# Patient Record
Sex: Male | Born: 1955 | Race: White | Hispanic: No | Smoking: Former smoker
Health system: Southern US, Community
[De-identification: ages and names within clinical notes are randomized; demographics above are authoritative.]

## PROBLEM LIST (undated history)

## (undated) DIAGNOSIS — K219 Gastro-esophageal reflux disease without esophagitis: Secondary | ICD-10-CM

## (undated) DIAGNOSIS — I10 Essential (primary) hypertension: Secondary | ICD-10-CM

## (undated) DIAGNOSIS — Z87442 Personal history of urinary calculi: Secondary | ICD-10-CM

## (undated) DIAGNOSIS — I739 Peripheral vascular disease, unspecified: Secondary | ICD-10-CM

## (undated) DIAGNOSIS — E119 Type 2 diabetes mellitus without complications: Secondary | ICD-10-CM

## (undated) DIAGNOSIS — M1990 Unspecified osteoarthritis, unspecified site: Secondary | ICD-10-CM

## (undated) HISTORY — PX: KNEE SURGERY: SHX244

## (undated) HISTORY — PX: PENILE PROSTHESIS IMPLANT: SHX240

## (undated) HISTORY — PX: WRIST SURGERY: SHX841

---

## 2009-08-31 ENCOUNTER — Ambulatory Visit (HOSPITAL_BASED_OUTPATIENT_CLINIC_OR_DEPARTMENT_OTHER): Admission: RE | Admit: 2009-08-31 | Discharge: 2009-08-31 | Payer: Self-pay | Admitting: Orthopedic Surgery

## 2009-10-09 ENCOUNTER — Encounter: Admission: RE | Admit: 2009-10-09 | Discharge: 2009-12-06 | Payer: Self-pay | Admitting: Orthopedic Surgery

## 2011-03-29 LAB — GLUCOSE, CAPILLARY: Glucose-Capillary: 152 mg/dL — ABNORMAL HIGH (ref 70–99)

## 2017-06-01 ENCOUNTER — Emergency Department (HOSPITAL_BASED_OUTPATIENT_CLINIC_OR_DEPARTMENT_OTHER): Payer: BLUE CROSS/BLUE SHIELD

## 2017-06-01 ENCOUNTER — Encounter (HOSPITAL_BASED_OUTPATIENT_CLINIC_OR_DEPARTMENT_OTHER): Payer: Self-pay | Admitting: Emergency Medicine

## 2017-06-01 ENCOUNTER — Emergency Department (HOSPITAL_BASED_OUTPATIENT_CLINIC_OR_DEPARTMENT_OTHER)
Admission: EM | Admit: 2017-06-01 | Discharge: 2017-06-01 | Disposition: A | Discharge status: Home / Self Care | Payer: BLUE CROSS/BLUE SHIELD | Attending: Emergency Medicine | Admitting: Emergency Medicine

## 2017-06-01 DIAGNOSIS — Z7984 Long term (current) use of oral hypoglycemic drugs: Secondary | ICD-10-CM | POA: Diagnosis not present

## 2017-06-01 DIAGNOSIS — E119 Type 2 diabetes mellitus without complications: Secondary | ICD-10-CM | POA: Insufficient documentation

## 2017-06-01 DIAGNOSIS — I10 Essential (primary) hypertension: Secondary | ICD-10-CM | POA: Diagnosis not present

## 2017-06-01 DIAGNOSIS — R109 Unspecified abdominal pain: Secondary | ICD-10-CM

## 2017-06-01 DIAGNOSIS — Z79899 Other long term (current) drug therapy: Secondary | ICD-10-CM | POA: Diagnosis not present

## 2017-06-01 DIAGNOSIS — Z7982 Long term (current) use of aspirin: Secondary | ICD-10-CM | POA: Insufficient documentation

## 2017-06-01 DIAGNOSIS — Z87891 Personal history of nicotine dependence: Secondary | ICD-10-CM | POA: Diagnosis not present

## 2017-06-01 HISTORY — DX: Essential (primary) hypertension: I10

## 2017-06-01 HISTORY — DX: Type 2 diabetes mellitus without complications: E11.9

## 2017-06-01 HISTORY — DX: Gastro-esophageal reflux disease without esophagitis: K21.9

## 2017-06-01 LAB — CBC WITH DIFFERENTIAL/PLATELET
BASOS ABS: 0 10*3/uL (ref 0.0–0.1)
Basophils Relative: 0 %
Eosinophils Absolute: 0.1 10*3/uL (ref 0.0–0.7)
Eosinophils Relative: 3 %
HCT: 42.7 % (ref 39.0–52.0)
Hemoglobin: 15.1 g/dL (ref 13.0–17.0)
LYMPHS PCT: 39 %
Lymphs Abs: 1.6 10*3/uL (ref 0.7–4.0)
MCH: 33.7 pg (ref 26.0–34.0)
MCHC: 35.4 g/dL (ref 30.0–36.0)
MCV: 95.3 fL (ref 78.0–100.0)
Monocytes Absolute: 0.5 10*3/uL (ref 0.1–1.0)
Monocytes Relative: 12 %
NEUTROS ABS: 1.9 10*3/uL (ref 1.7–7.7)
Neutrophils Relative %: 46 %
Platelets: 146 10*3/uL — ABNORMAL LOW (ref 150–400)
RBC: 4.48 MIL/uL (ref 4.22–5.81)
RDW: 11.7 % (ref 11.5–15.5)
WBC: 4.1 10*3/uL (ref 4.0–10.5)

## 2017-06-01 LAB — URINALYSIS, MICROSCOPIC (REFLEX)
RBC / HPF: NONE SEEN RBC/hpf (ref 0–5)
SQUAMOUS EPITHELIAL / LPF: NONE SEEN

## 2017-06-01 LAB — BASIC METABOLIC PANEL
ANION GAP: 10 (ref 5–15)
BUN: 15 mg/dL (ref 6–20)
CO2: 26 mmol/L (ref 22–32)
Calcium: 9.6 mg/dL (ref 8.9–10.3)
Chloride: 99 mmol/L — ABNORMAL LOW (ref 101–111)
Creatinine, Ser: 0.96 mg/dL (ref 0.61–1.24)
GFR calc Af Amer: >60 mL/min (ref >60)
GFR calc non Af Amer: >60 mL/min (ref >60)
GLUCOSE: 249 mg/dL — AB (ref 65–99)
Potassium: 4.3 mmol/L (ref 3.5–5.1)
Sodium: 135 mmol/L (ref 135–145)

## 2017-06-01 LAB — URINALYSIS, ROUTINE W REFLEX MICROSCOPIC
Bilirubin Urine: NEGATIVE
Hgb urine dipstick: NEGATIVE
Ketones, ur: 15 mg/dL — AB
LEUKOCYTES UA: NEGATIVE
Nitrite: NEGATIVE
PROTEIN: NEGATIVE mg/dL
Specific Gravity, Urine: 1.039 — ABNORMAL HIGH (ref 1.005–1.030)
pH: 5.5 (ref 5.0–8.0)

## 2017-06-01 MED ORDER — KETOROLAC TROMETHAMINE 30 MG/ML IJ SOLN
30.0000 mg | Freq: Once | INTRAMUSCULAR | Status: AC
  Administered 2017-06-01: 30 mg via INTRAVENOUS
  Filled 2017-06-01: qty 1

## 2017-06-01 MED ORDER — ONDANSETRON 4 MG PO TBDP: 4.0000 mg | ORAL_TABLET | Freq: Three times a day (TID) | ORAL | 0 refills | Status: DC | PRN

## 2017-06-01 MED ORDER — OXYCODONE-ACETAMINOPHEN 5-325 MG PO TABS
1.0000 | ORAL_TABLET | ORAL | 0 refills | Status: DC | PRN
Start: 2017-06-01 — End: 2018-06-29

## 2017-06-01 NOTE — Discharge Instructions (Signed)
As we discussed, your CT scan did show findings that you likely have recently passed a kidney stone. There was also a calcification noted in your left renal sinus, this is an incidental finding and should not cause you any issues. Take the prescribed medication as directed.  Use caution, can make you sleepy/drowsy.  Candidate for Zofran if needed for nausea. Follow-up with your urologist tomorrow-- copies of labs and CT on back for their review. Return to the ED for new or worsening symptoms.

## 2017-06-01 NOTE — ED Provider Notes (Signed)
[facility]   CSN: [id number] Arrival date & time: 06/01/17  [id number]     History   Chief Complaint Chief Complaint  Patient presents with  . Flank Pain    HPI Shawn Daniels is a 61 y.o. male.  The history is provided by the patient and medical records.  Flank Pain     61 year old male with history of diabetes, GERD, hypertension, presenting to the ED for left flank pain. States this began yesterday, has been coming in waves since onset. States when pain comes it is sharp and severe.  He has not had any nausea, vomiting, fever, or chills. He has had some intermittently dark urine, and sure if it is blood. He denies any dysuria or difficulty urinating. States he did do a lot of yard work day before yesterday, so initially thought it was a muscle strain, but pain has began wrapping around to the left anterior abdomen. He has no history of kidney stones. No prior abdominal surgeries.  States he did take a Ultracet this morning that he had left over from a dental procedure around 6:30 AM, states it has eased his pain, but still has waves of sharp pain that occur. No pain at present. No chest pain or shortness of breath.  Past Medical History:  Diagnosis Date  . Diabetes mellitus without complication (HCC)   . GERD (gastroesophageal reflux disease)   . Hypertension     There are no active problems to display for this patient.   History reviewed. No pertinent surgical history.     Home Medications    Prior to Admission medications   Medication Sig Start Date End Date Taking? Authorizing Provider  aspirin 81 MG chewable tablet Chew by mouth daily.   Yes [provider]  dapagliflozin propanediol (FARXIGA) 10 MG TABS tablet Take 10 mg by mouth daily.   Yes [provider]  lisinopril (PRINIVIL,ZESTRIL) 10 MG tablet Take 10 mg by mouth daily.   Yes [provider]  omeprazole (PRILOSEC) 10 MG capsule Take 10 mg by mouth  daily.   Yes [provider]    Family History No family history on file.  Social History Social History  Substance Use Topics  . Smoking status: Former Games developermoker  . Smokeless tobacco: Never Used  . Alcohol use Yes     Allergies   Codeine; Keflex [cephalexin]; and Penicillins   Review of Systems Review of Systems  Genitourinary: Positive for flank pain.  All other systems reviewed and are negative.    Physical Exam Updated Vital Signs BP (!) 164/91 (BP Location: Left Arm)   Pulse 88   Temp 98.9 F (37.2 C) (Oral)   Resp 18   Ht 6\' 1"  (1.854 m)   Wt 93.9 kg (207 lb)   SpO2 97%   BMI 27.31 kg/m   Physical Exam  Constitutional: He is oriented to person, place, and time. He appears well-developed and well-nourished.  HENT:  Head: Normocephalic and atraumatic.  Mouth/Throat: Oropharynx is clear and moist.  Eyes: Conjunctivae and EOM are normal. Pupils are equal, round, and reactive to light.  Neck: Normal range of motion.  Cardiovascular: Normal rate, regular rhythm and normal heart sounds.   Pulmonary/Chest: Effort normal and breath sounds normal. No respiratory distress. He has no wheezes.  Abdominal: Soft. Bowel sounds are normal. There is no tenderness. There is no CVA tenderness.  Abdomen soft, benign, no CVA tenderness  Musculoskeletal: Normal range of motion.  No  significant muscular tenderness of the left lower back, no midline deformity, no step-off  Neurological: He is alert and oriented to person, place, and time.  Skin: Skin is warm and dry.  Psychiatric: He has a normal mood and affect.  Nursing note and vitals reviewed.    ED Treatments / Results  Labs (all labs ordered are listed, but only abnormal results are displayed) Labs Reviewed  URINALYSIS, ROUTINE W REFLEX MICROSCOPIC - Abnormal; Notable for the following:       Result Value   Specific Gravity, Urine 1.039 (*)    Glucose, UA >=500 (*)    Ketones, ur 15 (*)    All other  components within normal limits  CBC WITH DIFFERENTIAL/PLATELET - Abnormal; Notable for the following:    Platelets 146 (*)    All other components within normal limits  BASIC METABOLIC PANEL - Abnormal; Notable for the following:    Chloride 99 (*)    Glucose, Bld 249 (*)    All other components within normal limits  URINALYSIS, MICROSCOPIC (REFLEX) - Abnormal; Notable for the following:    Bacteria, UA RARE (*)    All other components within normal limits    EKG  EKG Interpretation None       Radiology Ct Renal Stone Study  Result Date: 06/01/2017 CLINICAL DATA:  Left flank pain x1 day EXAM: CT ABDOMEN AND PELVIS WITHOUT CONTRAST TECHNIQUE: Multidetector CT imaging of the abdomen and pelvis was performed following the standard protocol without IV contrast. COMPARISON:  None. FINDINGS: Lower chest: Lung bases are clear. Hepatobiliary: Mild hepatic steatosis with focal fatty sparing along the gallbladder fossa. Gallbladder is unremarkable. No intrahepatic or extrahepatic dilatation. Pancreas: Within normal limits. Spleen: Within normal limits. Adrenals/Urinary Tract: Adrenal glands are within normal limits. Right kidney is within normal limits, noting renal vascular calcifications. No hydronephrosis. Left kidney is notable for mild nonspecific perinephric and periureteral stranding. Calcification in the left renal sinus (series 2/ image 30), suggests renal vascular calcification. No ureteral or bladder calculi.  No hydronephrosis. Bladder is within normal limits. Stomach/Bowel: Stomach is within normal limits. No evidence of bowel obstruction. Normal appendix (series 2/ image 60). Vascular/Lymphatic: No evidence of abdominal aortic aneurysm. Atherosclerotic calcifications of the abdominal aorta and branch vessels. No suspicious abdominopelvic lymphadenopathy. Reproductive: Prostate is unremarkable. Other: No abdominopelvic ascites. Tiny fat containing bilateral inguinal hernias.  Musculoskeletal: Visualized osseous structures are within normal limits. IMPRESSION: No renal, ureteral, or bladder calculi.  No hydronephrosis. Mild nonspecific left perinephric and left periureteral stranding. Differential considerations include recent passage of a ureteral calculus versus pyelonephritis. No evidence of bowel obstruction.  Normal appendix. Electronically Signed   By: Charline Bills M.D.   On: 06/01/2017 10:32    Procedures Procedures (including critical care time)     Medications Ordered in ED Medications - No data to display   Initial Impression / Assessment and Plan / ED Course  I have reviewed the triage vital signs and the nursing notes.  Pertinent labs & imaging results that were available during my care of the patient were reviewed by me and considered in my medical decision making (see chart for details).  61 year old male here with left flank pain. Began 2 days ago, has been waxing and waning since then. Did do some yard work recently, but has noticed pain radiating around the left abdomen. He is afebrile and nontoxic. Abdomen is soft and benign. No distinct CVA tenderness or paraspinal muscular tenderness. Lab work overall reassuring. Glucose is  mildly elevated but no signs of DKA. UA with rare bacteria, but overall noninfectious. CT renal study was obtained revealing left perinephric and periureteral stranding, possible due to recently passed stone.  Suspect this may be cause of symptoms.  Also note of calcification in left renal sinus.  Pain improved here with Toradol.  We'll plan to discharge home with continued symptomatic care. He has previously scheduled follow-up with his urologist tomorrow, I have given him copies of his lab and imaging studies from today to discuss with his physician.  Discussed plan with patient, he acknowledged understanding and agreed with plan of care.  Return precautions given for new or worsening symptoms.  Final Clinical  Impressions(s) / ED Diagnoses   Final diagnoses:  Left flank pain    New Prescriptions Discharge Medication List as of 06/01/2017 11:11 AM    START taking these medications   Details  ondansetron (ZOFRAN ODT) 4 MG disintegrating tablet Take 1 tablet (4 mg total) by mouth every 8 (eight) hours as needed for nausea., Starting Sun 06/01/2017, Print    oxyCODONE-acetaminophen (PERCOCET) 5-325 MG tablet Take 1 tablet by mouth every 4 (four) hours as needed., Starting Sun 06/01/2017, Print         Garlon Hatchet, PA-C 06/01/17 1159    Melene Plan, DO 06/01/17 1252

## 2017-06-01 NOTE — ED Triage Notes (Signed)
L flank pain since yesterday, denies N/V and urinary symptoms.

## 2017-09-09 ENCOUNTER — Encounter (HOSPITAL_BASED_OUTPATIENT_CLINIC_OR_DEPARTMENT_OTHER): Payer: Self-pay

## 2017-09-09 ENCOUNTER — Emergency Department (HOSPITAL_BASED_OUTPATIENT_CLINIC_OR_DEPARTMENT_OTHER)
Admission: EM | Admit: 2017-09-09 | Discharge: 2017-09-09 | Disposition: A | Discharge status: Home / Self Care | Payer: BLUE CROSS/BLUE SHIELD | Attending: Emergency Medicine | Admitting: Emergency Medicine

## 2017-09-09 ENCOUNTER — Emergency Department (HOSPITAL_BASED_OUTPATIENT_CLINIC_OR_DEPARTMENT_OTHER): Payer: BLUE CROSS/BLUE SHIELD

## 2017-09-09 ENCOUNTER — Emergency Department (HOSPITAL_COMMUNITY): Payer: BLUE CROSS/BLUE SHIELD

## 2017-09-09 DIAGNOSIS — Y999 Unspecified external cause status: Secondary | ICD-10-CM | POA: Insufficient documentation

## 2017-09-09 DIAGNOSIS — E119 Type 2 diabetes mellitus without complications: Secondary | ICD-10-CM | POA: Diagnosis not present

## 2017-09-09 DIAGNOSIS — S62212A Bennett's fracture, left hand, initial encounter for closed fracture: Secondary | ICD-10-CM | POA: Insufficient documentation

## 2017-09-09 DIAGNOSIS — R52 Pain, unspecified: Secondary | ICD-10-CM

## 2017-09-09 DIAGNOSIS — I10 Essential (primary) hypertension: Secondary | ICD-10-CM | POA: Insufficient documentation

## 2017-09-09 DIAGNOSIS — S6992XA Unspecified injury of left wrist, hand and finger(s), initial encounter: Secondary | ICD-10-CM | POA: Diagnosis present

## 2017-09-09 DIAGNOSIS — Z87891 Personal history of nicotine dependence: Secondary | ICD-10-CM | POA: Diagnosis not present

## 2017-09-09 DIAGNOSIS — W0110XA Fall on same level from slipping, tripping and stumbling with subsequent striking against unspecified object, initial encounter: Secondary | ICD-10-CM | POA: Diagnosis not present

## 2017-09-09 DIAGNOSIS — Z7982 Long term (current) use of aspirin: Secondary | ICD-10-CM | POA: Insufficient documentation

## 2017-09-09 DIAGNOSIS — Y929 Unspecified place or not applicable: Secondary | ICD-10-CM | POA: Diagnosis not present

## 2017-09-09 DIAGNOSIS — Z79899 Other long term (current) drug therapy: Secondary | ICD-10-CM | POA: Diagnosis not present

## 2017-09-09 DIAGNOSIS — Y9301 Activity, walking, marching and hiking: Secondary | ICD-10-CM | POA: Diagnosis not present

## 2017-09-09 NOTE — ED Provider Notes (Signed)
[facility]   CSN: [id number] Arrival date & time: 09/09/17  1122     History   Chief Complaint Chief Complaint  Patient presents with  . Finger Injury    HPI ISHAQ MAFFEI is a 61 y.o. male.  The history is provided by the patient and medical records. No language interpreter was used.   ORELL HURTADO is a left-hand dominant 61 y.o. male  with a PMH of HTN, DM, GERD who presents to the Emergency Department complaining of persistent left thumb pain x 2 weeks. Patient states that he tripped two weeks ago and fell, catching fall with his left hand. He believes thumb hit first then the rest of his hand. Initially, he had significant swelling which has improved however pain has persisted. He endorses full ROM but thumb just feels a little weaker than usual. No numbness or tingling. No history of similar injuries to the affected area. Alternating between ibuprofen and tylenol for pain with adequate relief.   Past Medical History:  Diagnosis Date  . Diabetes mellitus without complication (HCC)   . GERD (gastroesophageal reflux disease)   . Hypertension     There are no active problems to display for this patient.   History reviewed. No pertinent surgical history.     Home Medications    Prior to Admission medications   Medication Sig Start Date End Date Taking? Authorizing Provider  aspirin 81 MG chewable tablet Chew by mouth daily.    [provider]  dapagliflozin propanediol (FARXIGA) 10 MG TABS tablet Take 10 mg by mouth daily.    [provider]  lisinopril (PRINIVIL,ZESTRIL) 10 MG tablet Take 10 mg by mouth daily.    [provider]  omeprazole (PRILOSEC) 10 MG capsule Take 10 mg by mouth daily.    [provider]  ondansetron (ZOFRAN ODT) 4 MG disintegrating tablet Take 1 tablet (4 mg total) by mouth every 8 (eight) hours as needed for nausea. 06/01/17   Garlon Hatchet, PA-C  oxyCODONE-acetaminophen  (PERCOCET) 5-325 MG tablet Take 1 tablet by mouth every 4 (four) hours as needed. 06/01/17   Garlon Hatchet, PA-C    Family History No family history on file.  Social History Social History  Substance Use Topics  . Smoking status: Former Games developer  . Smokeless tobacco: Never Used  . Alcohol use Yes     Comment: occ     Allergies   Codeine; Keflex [cephalexin]; and Penicillins   Review of Systems Review of Systems  Musculoskeletal: Positive for arthralgias and joint swelling (Improved).  Neurological: Positive for weakness. Negative for numbness.     Physical Exam Updated Vital Signs BP (!) 163/80 (BP Location: Left Arm)   Pulse 89   Temp 98.1 F (36.7 C) (Oral)   Resp 18   Ht  (1.854 m)   Wt 94.3 kg (208 lb)   SpO2 98%   BMI 27.44 kg/m   Physical Exam  Constitutional: He appears well-developed and well-nourished. No distress.  HENT:  Head: Normocephalic and atraumatic.  Neck: Neck supple.  Cardiovascular: Normal rate, regular rhythm and normal heart sounds.   No murmur heard. Pulmonary/Chest: Effort normal and breath sounds normal. No respiratory distress. He has no wheezes. He has no rales.  Musculoskeletal:       Hands: Tenderness to palpation as depicted in image. Full ROM. Good grip strength. Sensation intact. Good cap refill. 2+ radial pulse.   Neurological: He is alert.  Skin: Skin is warm and dry.  Nursing note and vitals reviewed.    ED Treatments / Results  Labs (all labs ordered are listed, but only abnormal results are displayed) Labs Reviewed - No data to display  EKG  EKG Interpretation None       Radiology Dg Hand Complete Left  Result Date: 09/09/2017 CLINICAL DATA:  Fall 2 weeks ago with persistent thumb pain, initial encounter EXAM: LEFT HAND - COMPLETE 3+ VIEW COMPARISON:  None. FINDINGS: Mildly displaced fracture is noted through the base of the first metacarpal. It appears to extend into the carpometacarpal articulation.  Mild soft tissue swelling is noted. IMPRESSION: Mildly displaced fracture at the base of the first metacarpal. Electronically Signed   By: Alcide Clever M.D.   On: 09/09/2017 12:01    Procedures Procedures (including critical care time)  Medications Ordered in ED Medications - No data to display   Initial Impression / Assessment and Plan / ED Course  I have reviewed the triage vital signs and the nursing notes.  Pertinent labs & imaging results that were available during my care of the patient were reviewed by me and considered in my medical decision making (see chart for details).    YICHEN GILARDI is a 61 y.o. male who presents to ED for persistent right thumb pain after fall 2 weeks ago. NVI on exam. X-ray reveals mildly displaced fracture at the base of the first metacarpal. Discussed case with Dr. Pearletha Forge of Sports Medicine who recommends thumb spica splint and he will see him in the office in 1-2 weeks. Discussed findings and plan of care with patient who agrees to call Dr. Lazaro Arms office to arrange follow up. Reasons to return to ER discussed and all questions answered.    Final Clinical Impressions(s) / ED Diagnoses   Final diagnoses:  Closed Bennett's fracture of left thumb, initial encounter    New Prescriptions New Prescriptions   No medications on file     Ward, Chase Picket, PA-C 09/09/17 1249    Benjiman Core, MD 09/09/17 1536

## 2017-09-09 NOTE — Discharge Instructions (Signed)
Alternate between Tylenol and Ibuprofen as needed for pain. Ice affected area as needed.  Please call the sports medicine physician listed today or first thing in the morning to schedule a follow up appointment in the next 1-2 weeks.   It is very important to keep your splint dry until your follow up with the orthopedic doctor. You may place a plastic bag around the extremity with the splint while bathing to keep it dry. Also try to sleep with the extremity elevated for the next several nights to decrease swelling. Check the fingertips several times per day to make sure they are not cold, pale, or blue. If this is the case, the splint may be too tight and should return to the ER, your regular doctor or the orthopedist for recheck. Return to the ER for new or worsening symptoms, any additional concerns.

## 2017-09-09 NOTE — ED Triage Notes (Signed)
Pt states he fell 2 weeks ago-injured left thumb-wearing velcro splint-NAD-steady gait

## 2017-09-19 ENCOUNTER — Encounter: Payer: Self-pay | Admitting: Family Medicine

## 2017-09-19 ENCOUNTER — Ambulatory Visit (HOSPITAL_BASED_OUTPATIENT_CLINIC_OR_DEPARTMENT_OTHER)
Admission: RE | Admit: 2017-09-19 | Discharge: 2017-09-19 | Disposition: A | Discharge status: Home / Self Care | Payer: BLUE CROSS/BLUE SHIELD | Source: Ambulatory Visit | Attending: Family Medicine | Admitting: Family Medicine

## 2017-09-19 ENCOUNTER — Ambulatory Visit (INDEPENDENT_AMBULATORY_CARE_PROVIDER_SITE_OTHER): Payer: BLUE CROSS/BLUE SHIELD | Admitting: Family Medicine

## 2017-09-19 VITALS — BP 149/88 | Ht 73.0 in | Wt 208.0 lb

## 2017-09-19 DIAGNOSIS — S6992XA Unspecified injury of left wrist, hand and finger(s), initial encounter: Secondary | ICD-10-CM

## 2017-09-19 DIAGNOSIS — M25742 Osteophyte, left hand: Secondary | ICD-10-CM | POA: Diagnosis not present

## 2017-09-19 DIAGNOSIS — S62202A Unspecified fracture of first metacarpal bone, left hand, initial encounter for closed fracture: Secondary | ICD-10-CM

## 2017-09-19 NOTE — Patient Instructions (Signed)
You have a first metacarpal fracture that is healing well. Wear the thumb spica at all times if possible for the next 2 weeks. Elevate above your heart if needed for swelling. Tylenol, ibuprofen only if needed for pain. Follow up with me in 2 weeks for reevaluation. Given this is healing well with callus on x-rays we should not need to repeat x-rays again unless you have a new injury.

## 2017-09-21 DIAGNOSIS — S62202D Unspecified fracture of first metacarpal bone, left hand, subsequent encounter for fracture with routine healing: Secondary | ICD-10-CM | POA: Insufficient documentation

## 2017-09-21 NOTE — Assessment & Plan Note (Signed)
independently reviewed radiographs and excellent healing.  We discussed risk with this type of fracture of decreased strength, function but his presentation for care was delayed 2 weeks from injury so attempting conservative treatment.  Thumb spica brace for next 2 weeks.  Tylenol, ibuprofen only if needed.  F/u in 2 weeks - should not need repeat radiographs unless he has a new injury given callus already present.

## 2017-09-21 NOTE — Progress Notes (Signed)
PCP: Ignatius Specking, MD  Subjective:   HPI: Patient is a 61 y.o. male here for left thumb injury.  Patient reports on 9/1 he accidentally fell backwards, put his arm out to the side and sustained FOOSH injury with immediate pain, swelling of left thumb. He took tylenol, ibuprofen. Pain has come down, very mild soreness now base of thumb. Wearing thumb spica splint since his ED visit. No other skin changes. No numbness.  Past Medical History:  Diagnosis Date  . Diabetes mellitus without complication (HCC)   . GERD (gastroesophageal reflux disease)   . Hypertension     Current Outpatient Prescriptions on File Prior to Visit  Medication Sig Dispense Refill  . aspirin 81 MG chewable tablet Chew by mouth daily.    . dapagliflozin propanediol (FARXIGA) 10 MG TABS tablet Take 10 mg by mouth daily.    Marland Kitchen lisinopril (PRINIVIL,ZESTRIL) 10 MG tablet Take 10 mg by mouth daily.    Marland Kitchen omeprazole (PRILOSEC) 10 MG capsule Take 10 mg by mouth daily.    . ondansetron (ZOFRAN ODT) 4 MG disintegrating tablet Take 1 tablet (4 mg total) by mouth every 8 (eight) hours as needed for nausea. 10 tablet 0  . oxyCODONE-acetaminophen (PERCOCET) 5-325 MG tablet Take 1 tablet by mouth every 4 (four) hours as needed. 20 tablet 0   No current facility-administered medications on file prior to visit.     No past surgical history on file.  Allergies  Allergen Reactions  . Codeine   . Keflex [Cephalexin]   . Penicillins     Social History   Social History  . Marital status: Married    Spouse name: N/A  . Number of children: N/A  . Years of education: N/A   Occupational History  . Not on file.   Social History Main Topics  . Smoking status: Former Games developer  . Smokeless tobacco: Never Used  . Alcohol use Yes     Comment: occ  . Drug use: No  . Sexual activity: Not on file   Other Topics Concern  . Not on file   Social History Narrative  . No narrative on file    No family history on  file.  BP (!) 149/88   Ht  (1.854 m)   Wt 208 lb (94.3 kg)   BMI 27.44 kg/m   Review of Systems: See HPI above.     Objective:  Physical Exam:  Gen: NAD, comfortable in exam room  Left hand/wrist: No gross deformity, swelling, bruising.  No malrotation or angulation. No TTP including 1st metacarpal. FROM wrist.   Did not test strength with known fracture. Sensation intact to light touch distally.  Right thumb: FROM without pain.   Assessment & Plan:  1. Left 1st metacarpal fracture - independently reviewed radiographs and excellent healing.  We discussed risk with this type of fracture of decreased strength, function but his presentation for care was delayed 2 weeks from injury so attempting conservative treatment.  Thumb spica brace for next 2 weeks.  Tylenol, ibuprofen only if needed.  F/u in 2 weeks - should not need repeat radiographs unless he has a new injury given callus already present.

## 2017-10-03 ENCOUNTER — Ambulatory Visit (INDEPENDENT_AMBULATORY_CARE_PROVIDER_SITE_OTHER): Payer: BLUE CROSS/BLUE SHIELD | Admitting: Family Medicine

## 2017-10-03 ENCOUNTER — Encounter: Payer: Self-pay | Admitting: Family Medicine

## 2017-10-03 ENCOUNTER — Ambulatory Visit (HOSPITAL_BASED_OUTPATIENT_CLINIC_OR_DEPARTMENT_OTHER)
Admission: RE | Admit: 2017-10-03 | Discharge: 2017-10-03 | Disposition: A | Discharge status: Home / Self Care | Payer: BLUE CROSS/BLUE SHIELD | Source: Ambulatory Visit | Attending: Family Medicine | Admitting: Family Medicine

## 2017-10-03 VITALS — BP 147/83 | HR 88 | Ht 73.0 in | Wt 208.0 lb

## 2017-10-03 DIAGNOSIS — S62202D Unspecified fracture of first metacarpal bone, left hand, subsequent encounter for fracture with routine healing: Secondary | ICD-10-CM | POA: Diagnosis not present

## 2017-10-03 DIAGNOSIS — X58XXXA Exposure to other specified factors, initial encounter: Secondary | ICD-10-CM | POA: Insufficient documentation

## 2017-10-03 DIAGNOSIS — S62202A Unspecified fracture of first metacarpal bone, left hand, initial encounter for closed fracture: Secondary | ICD-10-CM | POA: Diagnosis not present

## 2017-10-03 DIAGNOSIS — X58XXXD Exposure to other specified factors, subsequent encounter: Secondary | ICD-10-CM | POA: Diagnosis not present

## 2017-10-03 DIAGNOSIS — S6992XD Unspecified injury of left wrist, hand and finger(s), subsequent encounter: Secondary | ICD-10-CM

## 2017-10-03 NOTE — Patient Instructions (Addendum)
Your x-rays again show excellent healing. I would go ahead with occupational therapy to work on regaining full motion, strength, and to work out the stiffness in this thumb. At this point you can use the brace only if needed. Tylenol, ibuprofen if needed. Follow up with me in 4 weeks.

## 2017-10-05 NOTE — Assessment & Plan Note (Signed)
Left 1st metacarpal fracture - independently reviewed radiographs and fracture shows callus formation.  About 6 weeks out now.  Pain is minimal.  Will go ahead with occupational therapy and home exercises.  Stop using thumb spica.  Tylenol, ibuprofen if needed.  F/u in 4 weeks.

## 2017-10-05 NOTE — Progress Notes (Signed)
PCP: Ignatius Specking, MD  Subjective:   HPI: Patient is a 61 y.o. male here for left thumb injury.  9/28: Patient reports on 9/1 he accidentally fell backwards, put his arm out to the side and sustained FOOSH injury with immediate pain, swelling of left thumb. He took tylenol, ibuprofen. Pain has come down, very mild soreness now base of thumb. Wearing thumb spica splint since his ED visit. No other skin changes. No numbness.  10/12: Patient reports he's doing well. Some soreness at 1/10 level base of his left thumb. He is wearing his thumb spica brace regularly. No skin changes, numbness. Left handed.  Past Medical History:  Diagnosis Date  . Diabetes mellitus without complication (HCC)   . GERD (gastroesophageal reflux disease)   . Hypertension     Current Outpatient Prescriptions on File Prior to Visit  Medication Sig Dispense Refill  . aspirin 81 MG chewable tablet Chew by mouth daily.    . dapagliflozin propanediol (FARXIGA) 10 MG TABS tablet Take 10 mg by mouth daily.    Marland Kitchen lisinopril (PRINIVIL,ZESTRIL) 10 MG tablet Take 10 mg by mouth daily.    Marland Kitchen omeprazole (PRILOSEC) 10 MG capsule Take 10 mg by mouth daily.    . ondansetron (ZOFRAN ODT) 4 MG disintegrating tablet Take 1 tablet (4 mg total) by mouth every 8 (eight) hours as needed for nausea. 10 tablet 0  . oxyCODONE-acetaminophen (PERCOCET) 5-325 MG tablet Take 1 tablet by mouth every 4 (four) hours as needed. 20 tablet 0  . sildenafil (REVATIO) 20 MG tablet TAKE 2-5 TABLETS BY MOUTH EVERY DAY AS NEEDED FOR SEXUAL ACTIVITY AS NEEDED  10   No current facility-administered medications on file prior to visit.     No past surgical history on file.  Allergies  Allergen Reactions  . Codeine   . Keflex [Cephalexin]   . Penicillins     Social History   Social History  . Marital status: Married    Spouse name: N/A  . Number of children: N/A  . Years of education: N/A   Occupational History  . Not on file.    Social History Main Topics  . Smoking status: Former Games developer  . Smokeless tobacco: Never Used  . Alcohol use Yes     Comment: occ  . Drug use: No  . Sexual activity: Not on file   Other Topics Concern  . Not on file   Social History Narrative  . No narrative on file    No family history on file.  BP (!) 147/83   Pulse 88   Ht  (1.854 m)   Wt 208 lb (94.3 kg)   BMI 27.44 kg/m   Review of Systems: See HPI above.     Objective:  Physical Exam:  Gen: NAD, comfortable in exam room.  Left hand/wrist: No gross deformity, swelling, bruising.  No malrotation or angulation. No TTP including 1st metacarpal. FROM wrist.  Mild limitation all motions of left thumb. 4/5 grip strength and with thumb flexion/extension. Sensation intact to light touch.  Right hand/wrist: FROM wrist and thumb with 5/5 strength, no pain.   Assessment & Plan:  1. Left 1st metacarpal fracture - independently reviewed radiographs and fracture shows callus formation.  About 6 weeks out now.  Pain is minimal.  Will go ahead with occupational therapy and home exercises.  Stop using thumb spica.  Tylenol, ibuprofen if needed.  F/u in 4 weeks.

## 2017-10-31 ENCOUNTER — Encounter: Payer: Self-pay | Admitting: Family Medicine

## 2017-10-31 ENCOUNTER — Ambulatory Visit (INDEPENDENT_AMBULATORY_CARE_PROVIDER_SITE_OTHER): Payer: BLUE CROSS/BLUE SHIELD | Admitting: Family Medicine

## 2017-10-31 ENCOUNTER — Encounter (INDEPENDENT_AMBULATORY_CARE_PROVIDER_SITE_OTHER): Payer: Self-pay

## 2017-10-31 VITALS — BP 162/87 | HR 80 | Ht 73.0 in | Wt 205.0 lb

## 2017-10-31 DIAGNOSIS — S62202D Unspecified fracture of first metacarpal bone, left hand, subsequent encounter for fracture with routine healing: Secondary | ICD-10-CM

## 2017-10-31 DIAGNOSIS — M79645 Pain in left finger(s): Secondary | ICD-10-CM | POA: Diagnosis not present

## 2017-10-31 DIAGNOSIS — R2232 Localized swelling, mass and lump, left upper limb: Secondary | ICD-10-CM

## 2017-10-31 DIAGNOSIS — E119 Type 2 diabetes mellitus without complications: Secondary | ICD-10-CM

## 2017-10-31 NOTE — Patient Instructions (Signed)
Continue the therapy as you have been - when you feel comfortable you can transition to a home exercise program. We will do an MRI with and without contrast of your thumb to assess this mass, make sure it isn't a sarcoma that would need to be removed.

## 2017-11-01 ENCOUNTER — Encounter: Payer: Self-pay | Admitting: Family Medicine

## 2017-11-01 DIAGNOSIS — R2232 Localized swelling, mass and lump, left upper limb: Secondary | ICD-10-CM | POA: Insufficient documentation

## 2017-11-01 NOTE — Progress Notes (Addendum)
PCP: Ignatius SpeckingVyas, Dhruv B, MD  Subjective:   HPI: Patient is a 61 y.o. male here for left thumb injury.  9/28: Patient reports on 9/1 he accidentally fell backwards, put his arm out to the side and sustained FOOSH injury with immediate pain, swelling of left thumb. He took tylenol, ibuprofen. Pain has come down, very mild soreness now base of thumb. Wearing thumb spica splint since his ED visit. No other skin changes. No numbness.  10/12: Patient reports he's doing well. Some soreness at 1/10 level base of his left thumb. He is wearing his thumb spica brace regularly. No skin changes, numbness. Left handed.  11/9: Patient returns reporting he's doing well. Has done 2 visits of PT and doing home exercises also. Pain level 0/10 and feels stronger. No issues with his competitive dancing now. He noticed a swollen area inside of his left thumb - no pain here. No skin changes, numbness.  Past Medical History:  Diagnosis Date  . Diabetes mellitus without complication (HCC)   . GERD (gastroesophageal reflux disease)   . Hypertension     Current Outpatient Medications on File Prior to Visit  Medication Sig Dispense Refill  . aspirin 81 MG chewable tablet Chew by mouth daily.    . dapagliflozin propanediol (FARXIGA) 10 MG TABS tablet Take 10 mg by mouth daily.    Marland Kitchen. lisinopril (PRINIVIL,ZESTRIL) 10 MG tablet Take 10 mg by mouth daily.    Marland Kitchen. omeprazole (PRILOSEC) 10 MG capsule Take 10 mg by mouth daily.    . ondansetron (ZOFRAN ODT) 4 MG disintegrating tablet Take 1 tablet (4 mg total) by mouth every 8 (eight) hours as needed for nausea. 10 tablet 0  . oxyCODONE-acetaminophen (PERCOCET) 5-325 MG tablet Take 1 tablet by mouth every 4 (four) hours as needed. 20 tablet 0  . rosuvastatin (CRESTOR) 10 MG tablet     . sildenafil (REVATIO) 20 MG tablet TAKE 2-5 TABLETS BY MOUTH EVERY DAY AS NEEDED FOR SEXUAL ACTIVITY AS NEEDED  10   No current facility-administered medications on file prior to  visit.     History reviewed. No pertinent surgical history.  Allergies  Allergen Reactions  . Codeine   . Keflex [Cephalexin]   . Penicillins     Social History   Socioeconomic History  . Marital status: Married    Spouse name: Not on file  . Number of children: Not on file  . Years of education: Not on file  . Highest education level: Not on file  Social Needs  . Financial resource strain: Not on file  . Food insecurity - worry: Not on file  . Food insecurity - inability: Not on file  . Transportation needs - medical: Not on file  . Transportation needs - non-medical: Not on file  Occupational History  . Not on file  Tobacco Use  . Smoking status: Former Games developermoker  . Smokeless tobacco: Never Used  Substance and Sexual Activity  . Alcohol use: Yes    Comment: occ  . Drug use: No  . Sexual activity: Not on file  Other Topics Concern  . Not on file  Social History Narrative  . Not on file    History reviewed. No pertinent family history.  BP (!) 162/87   Pulse 80   Ht 6\' 1"  (1.854 m)   Wt 205 lb (93 kg)   BMI 27.05 kg/m   Review of Systems: See HPI above.     Objective:  Physical Exam:  Gen: NAD, comfortable  in exam room.  Left hand/wrist: ~1cm mobile mass on ulnar side of 1st digit MCP area.  No bruising, other deformity. No TTP including 1st metacarpal. FROM wrist and digits.  5/5 grip strength now and other thumb motions. NVI distally.  MSK u/s: Mass noted above is solid with vascularity to it - no anechoic areas.  Assessment & Plan:  1. Left 1st metacarpal fracture - clinically improved and strength also improving with physical therapy and home exercises.  Continue physical therapy and transition to home program only when he feels comfortable.  Tylenol if needed.  2. Mass left thumb - surprisingly noted some vascularity in this, not a cyst as expected.  Advised we go ahead with MRI with and without contrast to ensure this is not a sarcoma or other  malignant tumor.  Addendum:  Per radiology he needs an updated creatinine so BMP order placed.  Also requested medication to relax him for MRI - valium rx 2 tabs after reviewing database.

## 2017-11-01 NOTE — Assessment & Plan Note (Signed)
clinically improved and strength also improving with physical therapy and home exercises.  Continue physical therapy and transition to home program only when he feels comfortable.  Tylenol if needed.

## 2017-11-01 NOTE — Assessment & Plan Note (Signed)
surprisingly noted some vascularity in this, not a cyst as expected.  Advised we go ahead with MRI with and without contrast to ensure this is not a sarcoma or other malignant tumor.

## 2017-11-03 MED ORDER — DIAZEPAM 5 MG PO TABS: ORAL_TABLET | ORAL | 0 refills | Status: DC

## 2017-11-03 NOTE — Addendum Note (Signed)
Addended by: Lenda KelpHUDNALL, SHANE R on: 11/03/2017 01:35 PM   Modules accepted: Orders

## 2017-11-03 NOTE — Addendum Note (Signed)
Addended by: Lenda KelpHUDNALL, SHANE R on: 11/03/2017 01:20 PM   Modules accepted: Orders

## 2017-11-06 NOTE — Addendum Note (Signed)
Addended by: Kathi SimpersWISE, Lanea Vankirk F on: 11/06/2017 09:14 AM   Modules accepted: Orders

## 2017-11-07 ENCOUNTER — Telehealth: Payer: Self-pay | Admitting: Family Medicine

## 2017-11-07 NOTE — Telephone Encounter (Signed)
Spoke to patient and told him that I would contact radiology and let them know that he would not be able to do his MRI. Radiology stated that they would contact him and reschedule.

## 2017-11-07 NOTE — Telephone Encounter (Signed)
Patient called and is unable to get lab work done and MRI due to current pain and inability to lay still

## 2017-11-08 ENCOUNTER — Ambulatory Visit (HOSPITAL_BASED_OUTPATIENT_CLINIC_OR_DEPARTMENT_OTHER): Payer: BLUE CROSS/BLUE SHIELD

## 2017-11-15 ENCOUNTER — Ambulatory Visit (HOSPITAL_BASED_OUTPATIENT_CLINIC_OR_DEPARTMENT_OTHER): Payer: BLUE CROSS/BLUE SHIELD

## 2018-05-29 IMAGING — CT CT RENAL STONE PROTOCOL
2 of 4 series · 16 of 46 positions shown, 18 images · non-contrast
Comparison: None.

CLINICAL DATA: Left flank pain x1 day

EXAM:
CT ABDOMEN AND PELVIS WITHOUT CONTRAST
TECHNIQUE: Multidetector CT imaging of the abdomen and pelvis was performed
following the standard protocol without IV contrast.

[Series 2: axial st · axial · 0.83mm/px · z∈[-521,-71]mm · 13 of 98 slices shown, 15 images]
[im 4/98  soft-tissue]
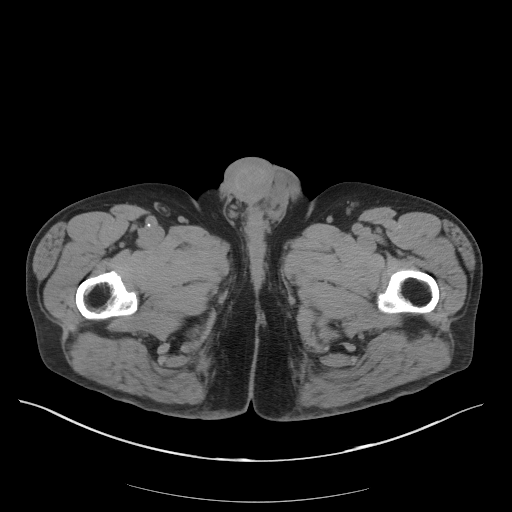
[im 4/98  bone]
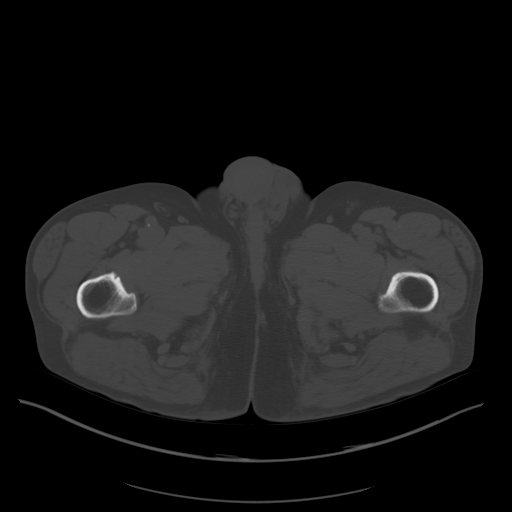
[im 12/98  soft-tissue]
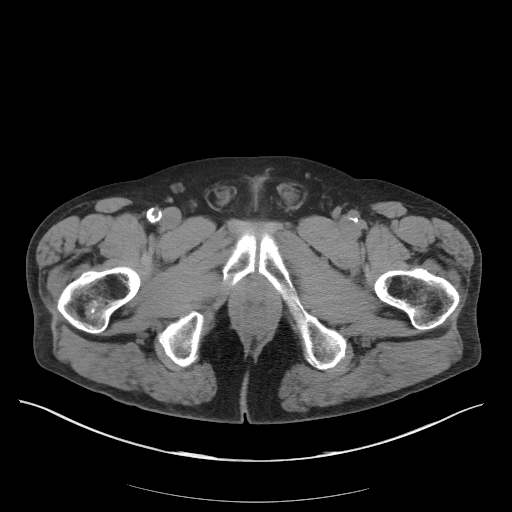
[im 20/98  soft-tissue]
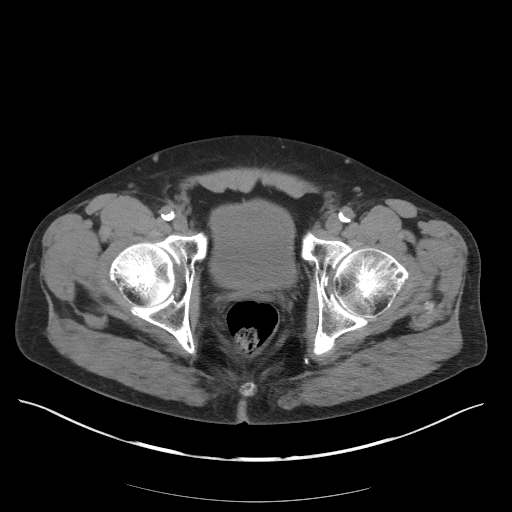
[im 28/98  soft-tissue]
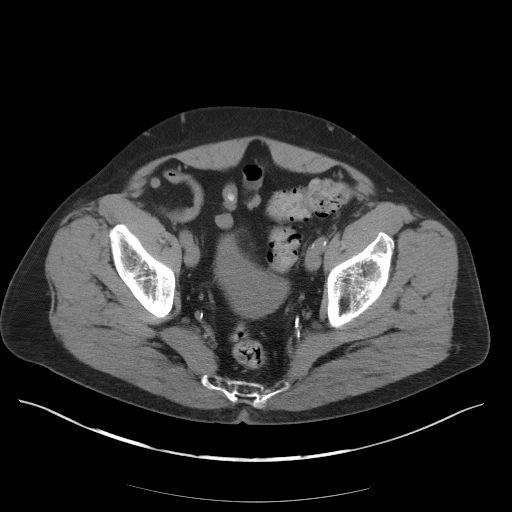
[im 35/98  soft-tissue]
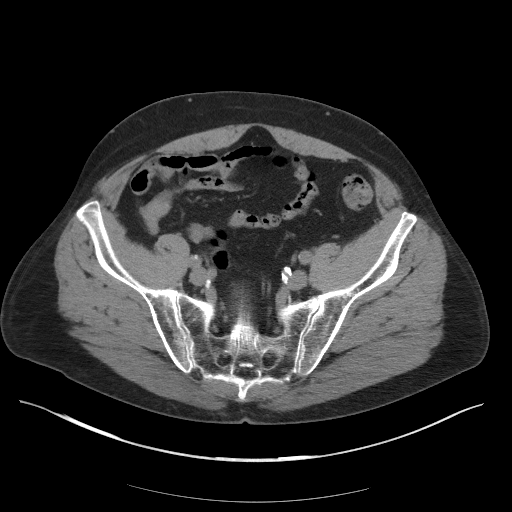
[im 43/98  soft-tissue]
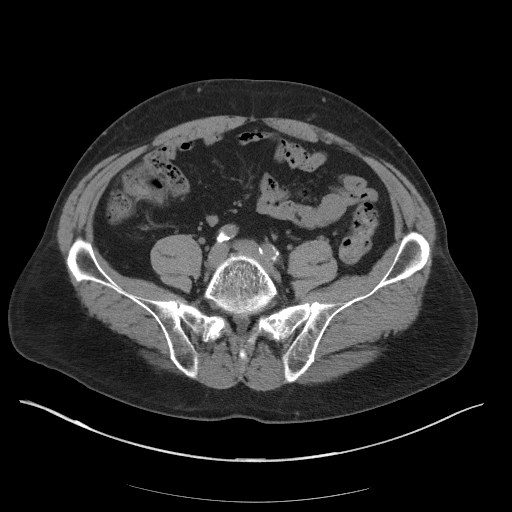
[im 51/98  soft-tissue]
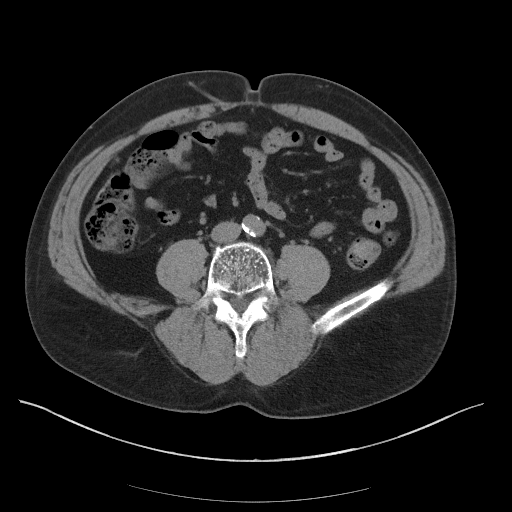
[im 55/98  soft-tissue]
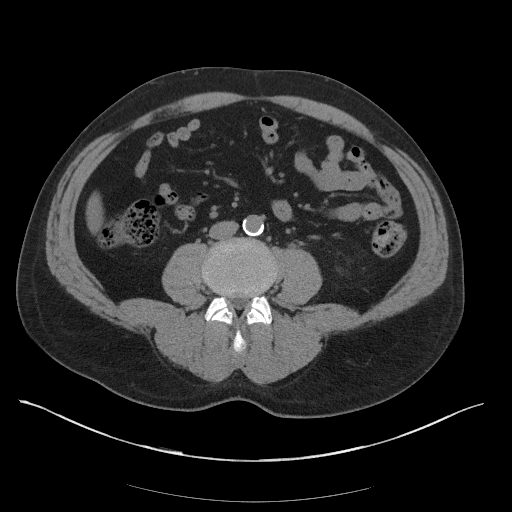
[im 63/98  soft-tissue]
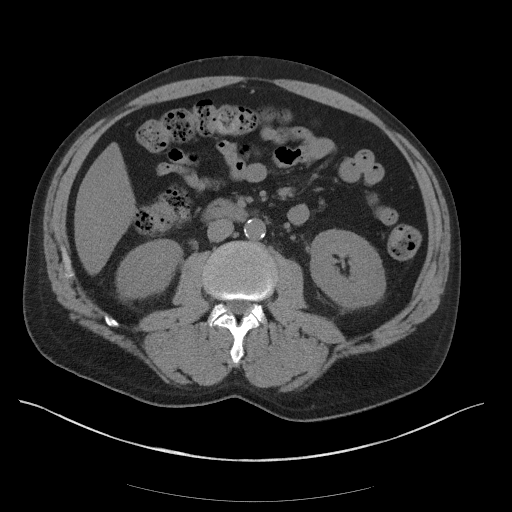
[im 63/98  bone]
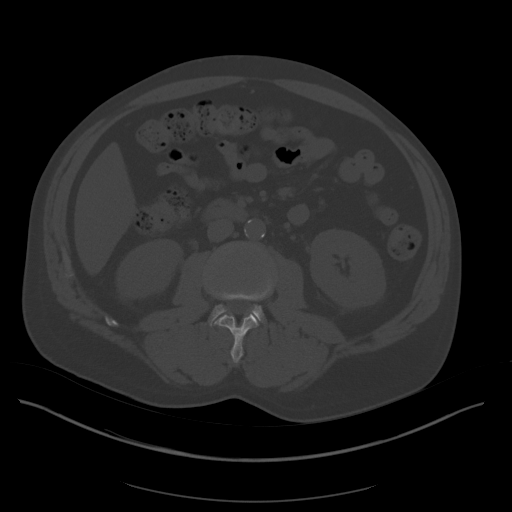
[im 70/98  soft-tissue]
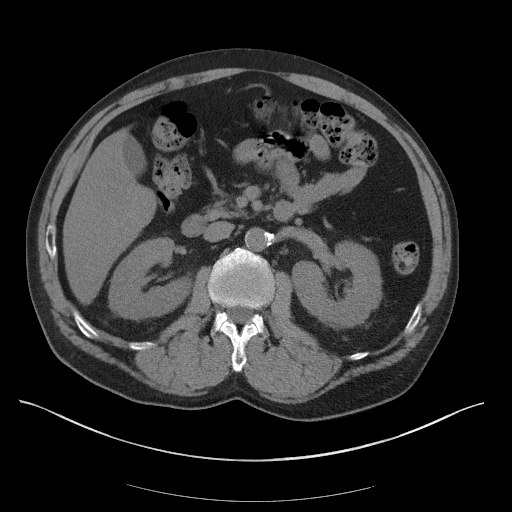
[im 78/98  soft-tissue]
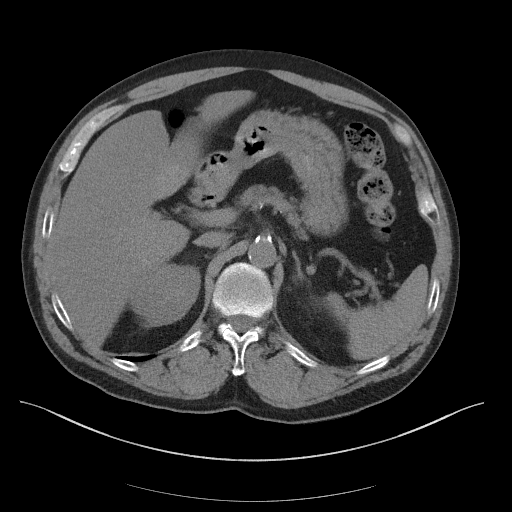
[im 86/98  soft-tissue]
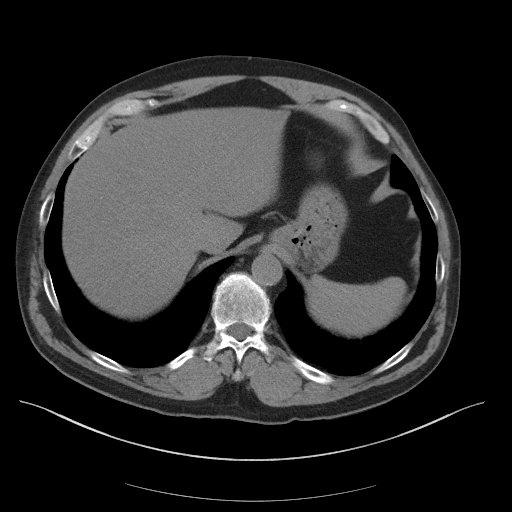
[im 94/98  soft-tissue]
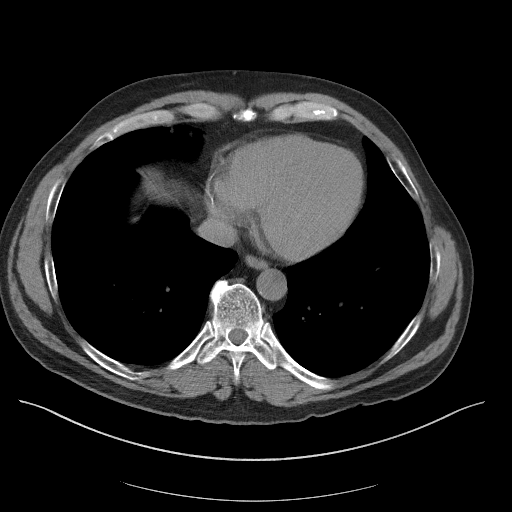

[Series 5: coronal st · coronal · 0.76mm/px · 3 of 90 slices shown]
[im 30/90  soft-tissue]
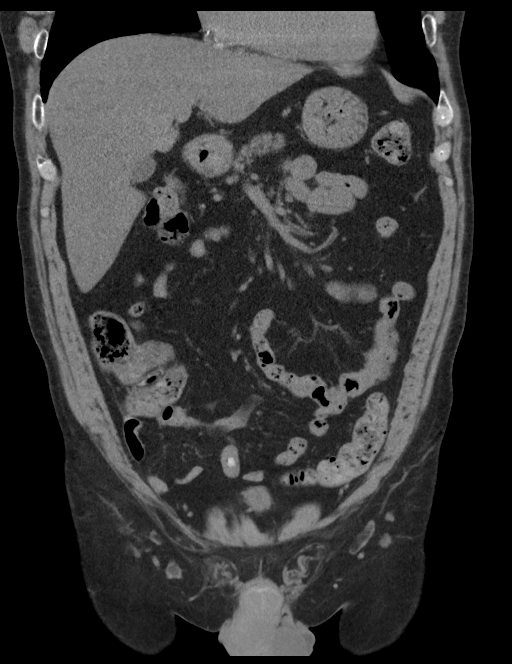
[im 40/90  soft-tissue]
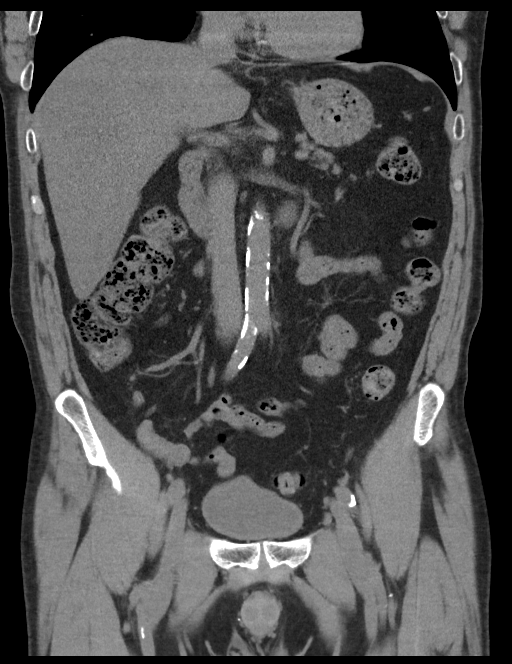
[im 50/90  soft-tissue]
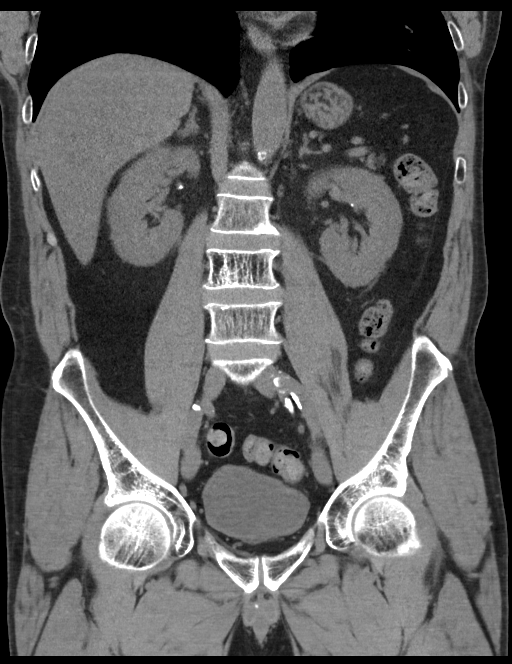

[16 of 46 positions shown; findings below may reference images not displayed]

FINDINGS: Lower chest: Lung bases are clear.

Hepatobiliary: Mild hepatic steatosis with focal fatty sparing along
the gallbladder fossa.

Gallbladder is unremarkable. No intrahepatic or extrahepatic
dilatation.

Pancreas: Within normal limits.

Spleen: Within normal limits.

Adrenals/Urinary Tract: Adrenal glands are within normal limits.

Right kidney is within normal limits, noting renal vascular
calcifications. No hydronephrosis.

Left kidney is notable for mild nonspecific perinephric and
periureteral stranding. Calcification in the left renal sinus
(series 2/ image 30), suggests renal vascular calcification.

No ureteral or bladder calculi.  No hydronephrosis.

Bladder is within normal limits.

Stomach/Bowel: Stomach is within normal limits.

No evidence of bowel obstruction.

Normal appendix (series 2/ image 60).

Vascular/Lymphatic: No evidence of abdominal aortic aneurysm.

Atherosclerotic calcifications of the abdominal aorta and branch
vessels.

No suspicious abdominopelvic lymphadenopathy.

Reproductive: Prostate is unremarkable.

Other: No abdominopelvic ascites.

Tiny fat containing bilateral inguinal hernias.

Musculoskeletal: Visualized osseous structures are within normal
limits.
IMPRESSION: No renal, ureteral, or bladder calculi.  No hydronephrosis.

Mild nonspecific left perinephric and left periureteral stranding.
Differential considerations include recent passage of a ureteral
calculus versus pyelonephritis.

No evidence of bowel obstruction.  Normal appendix.

## 2018-06-29 ENCOUNTER — Ambulatory Visit (INDEPENDENT_AMBULATORY_CARE_PROVIDER_SITE_OTHER): Payer: BLUE CROSS/BLUE SHIELD | Admitting: Family Medicine

## 2018-06-29 ENCOUNTER — Encounter: Payer: Self-pay | Admitting: Family Medicine

## 2018-06-29 DIAGNOSIS — M65312 Trigger thumb, left thumb: Secondary | ICD-10-CM

## 2018-06-29 DIAGNOSIS — M6530 Trigger finger, unspecified finger: Secondary | ICD-10-CM | POA: Diagnosis not present

## 2018-06-29 MED ORDER — METHYLPREDNISOLONE ACETATE 40 MG/ML IJ SUSP
20.0000 mg | Freq: Once | INTRAMUSCULAR | Status: AC
  Administered 2018-06-29: 20 mg via INTRA_ARTICULAR

## 2018-06-29 NOTE — Assessment & Plan Note (Signed)
injection given today.  Icing, tylenol, aleve as needed.  Consider splinting, repeat injection if not improving.  After informed written consent timeout was performed.  Patient was seated in chair in exam room.  Area overlying A1 pulley of 1st digit prepped with alcohol swab then injected with 0.5:0.905mL bupivicaine: depomedrol.  Patient tolerated procedure well without immediate complications.

## 2018-06-29 NOTE — Progress Notes (Signed)
PCP: Ignatius SpeckingVyas, Dhruv B, MD  Subjective:   HPI: Patient is a 62 y.o. male here for left thumb pain.  Patient reports he's been working out since the beginning of the year. He reports about 2.5 months ago he started to get pain left thumb about the IP joint, has developed locking with flexion of this joint. Pain level 0/10 at rest, can be sharp and severe if he flexes this. Also reports 3 weeks ago in the middle of the night his left pinky finger had severe pain, swelling that developed. 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 No history of gout. Did not strike this on anything during the night and no bug bite. Finger has improved mainly, able to bend - swelling has improved. No skin changes, numbness.  Past Medical History:  Diagnosis Date  . Diabetes mellitus without complication (HCC)   . GERD (gastroesophageal reflux disease)   . Hypertension     Current Outpatient Medications on File Prior to Visit  Medication Sig Dispense Refill  . aspirin 81 MG chewable tablet Chew by mouth daily.    . cetirizine (ZYRTEC) 10 MG tablet Take 10 mg by mouth at bedtime.  2  . dapagliflozin propanediol (FARXIGA) 10 MG TABS tablet Take 10 mg by mouth daily.    Marland Kitchen. lisinopril (PRINIVIL,ZESTRIL) 10 MG tablet Take 10 mg by mouth daily.    Marland Kitchen. omeprazole (PRILOSEC) 20 MG capsule     . ondansetron (ZOFRAN ODT) 4 MG disintegrating tablet Take 1 tablet (4 mg total) by mouth every 8 (eight) hours as needed for nausea. 10 tablet 0  . rosuvastatin (CRESTOR) 10 MG tablet     . sildenafil (REVATIO) 20 MG tablet TAKE 2-5 TABLETS BY MOUTH EVERY DAY AS NEEDED FOR SEXUAL ACTIVITY AS NEEDED  10   No current facility-administered medications on file prior to visit.     History reviewed. No pertinent surgical history.  Allergies  Allergen Reactions  . Codeine   . Keflex [Cephalexin]   . Penicillins     Social History   Socioeconomic History  . Marital  status: Married    Spouse name: Not on file  . Number of children: Not on file  . Years of education: Not on file  . Highest education level: Not on file  Occupational History  . Not on file  Social Needs  . Financial resource strain: Not on file  . Food insecurity:    Worry: Not on file    Inability: Not on file  . Transportation needs:    Medical: Not on file    Non-medical: Not on file  Tobacco Use  . Smoking status: Former Games developermoker  . Smokeless tobacco: Never Used  Substance and Sexual Activity  . Alcohol use: Yes    Comment: occ  . Drug use: No  . Sexual activity: Not on file  Lifestyle  . Physical activity:    Days per week: Not on file    Minutes per session: Not on file  . Stress: Not on file  Relationships  . Social connections:    Talks on phone: Not on file    Gets together: Not on file    Attends religious service: Not on file    Active member of club or organization: Not on file    Attends meetings of clubs or organizations: Not on file    Relationship status: Not on file  . Intimate partner violence:  Fear of current or ex partner: Not on file    Emotionally abused: Not on file    Physically abused: Not on file    Forced sexual activity: Not on file  Other Topics Concern  . Not on file  Social History Narrative  . Not on file    History reviewed. No pertinent family history.  BP (!) 148/82   Pulse 79   Ht 6\' 1"  (1.854 m)   Wt 207 lb (93.9 kg)   BMI 27.31 kg/m   Review of Systems: See HPI above.     Objective:  Physical Exam:  Gen: NAD, comfortable in exam room  Left hand/wrist: Catching, locking of 1st digit with flexion of IP joint.  Nodule at level of A1 pulley.  No malrotation, other deformity.  Minimal swelling of 5th digit.  TTP at A1 pulley 1st digit.  No other tenderness. FROM all digits with 5/5 strength flexion/extension. NVI distally.  Right hand/wrist: No deformity. FROM with 5/5 strength. No tenderness to  palpation. NVI distally.   Assessment & Plan:  1. Left trigger thumb - injection given today.  Icing, tylenol, aleve as needed.  Consider splinting, repeat injection if not improving.  After informed written consent timeout was performed.  Patient was seated in chair in exam room.  Area overlying A1 pulley of 1st digit prepped with alcohol swab then injected with 0.5:0.41mL bupivicaine: depomedrol.  Patient tolerated procedure well without immediate complications.  2. Left 5th digit - possible initial gout flare.  Advised to call us if this happens again to be reevaluated.

## 2018-06-29 NOTE — Patient Instructions (Signed)
You have a trigger thumb.  You were given a cortisone injection for this today. Ice the area 15 minutes at a time 3-4 times a day. Tylenol, aleve as needed. Consider U shaped aluminum splint if you're still having problems with this. Expect blood sugars to be elevated for about 8 days. Follow up with me in 1 month or as needed (if you get a similar swelling/warmth occurrence like you did in your pinky, call me to see you then - I suspect you had an initial gout flare).

## 2018-07-28 ENCOUNTER — Ambulatory Visit (INDEPENDENT_AMBULATORY_CARE_PROVIDER_SITE_OTHER): Payer: BLUE CROSS/BLUE SHIELD | Admitting: Family Medicine

## 2018-07-28 ENCOUNTER — Encounter: Payer: Self-pay | Admitting: Family Medicine

## 2018-07-28 VITALS — BP 139/75 | HR 105 | Ht 73.0 in | Wt 206.0 lb

## 2018-07-28 DIAGNOSIS — M65312 Trigger thumb, left thumb: Secondary | ICD-10-CM

## 2018-07-28 NOTE — Progress Notes (Signed)
PCP: Ignatius Specking, MD  Subjective:   HPI: Patient is a 62 y.o. male here for left thumb pain.  7/8: Patient reports he's been working out since the beginning of the year. He reports about 2.5 months ago he started to get pain left thumb about the IP joint, has developed locking with flexion of this joint. Pain level 0/10 at rest, can be sharp and severe if he flexes this. Also reports 3 weeks ago in the middle of the night his left pinky finger had severe pain, swelling that developed. 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 No history of gout. Did not strike this on anything during the night and no bug bite. Finger has improved mainly, able to bend - swelling has improved. No skin changes, numbness.  8/6: Patient reports he's doing great. Pain level 0/10 even with flexion. Injection took about a week to work for trigger thumb. Took aleve initially.  Past Medical History:  Diagnosis Date  . Diabetes mellitus without complication (HCC)   . GERD (gastroesophageal reflux disease)   . Hypertension     Current Outpatient Medications on File Prior to Visit  Medication Sig Dispense Refill  . aspirin 81 MG chewable tablet Chew by mouth daily.    . cetirizine (ZYRTEC) 10 MG tablet Take 10 mg by mouth at bedtime.  2  . dapagliflozin propanediol (FARXIGA) 10 MG TABS tablet Take 10 mg by mouth daily.    Marland Kitchen lisinopril (PRINIVIL,ZESTRIL) 10 MG tablet Take 10 mg by mouth daily.    Marland Kitchen omeprazole (PRILOSEC) 20 MG capsule     . ondansetron (ZOFRAN ODT) 4 MG disintegrating tablet Take 1 tablet (4 mg total) by mouth every 8 (eight) hours as needed for nausea. 10 tablet 0  . rosuvastatin (CRESTOR) 10 MG tablet     . sildenafil (REVATIO) 20 MG tablet TAKE 2-5 TABLETS BY MOUTH EVERY DAY AS NEEDED FOR SEXUAL ACTIVITY AS NEEDED  10   No current facility-administered medications on file prior to visit.     History reviewed. No pertinent  surgical history.  Allergies  Allergen Reactions  . Codeine   . Keflex [Cephalexin]   . Penicillins     Social History   Socioeconomic History  . Marital status: Married    Spouse name: Not on file  . Number of children: Not on file  . Years of education: Not on file  . Highest education level: Not on file  Occupational History  . Not on file  Social Needs  . Financial resource strain: Not on file  . Food insecurity:    Worry: Not on file    Inability: Not on file  . Transportation needs:    Medical: Not on file    Non-medical: Not on file  Tobacco Use  . Smoking status: Former Games developer  . Smokeless tobacco: Never Used  Substance and Sexual Activity  . Alcohol use: Yes    Comment: occ  . Drug use: No  . Sexual activity: Not on file  Lifestyle  . Physical activity:    Days per week: Not on file    Minutes per session: Not on file  . Stress: Not on file  Relationships  . Social connections:    Talks on phone: Not on file    Gets together: Not on file    Attends religious service: Not on file    Active member of club or organization: Not on file  Attends meetings of clubs or organizations: Not on file    Relationship status: Not on file  . Intimate partner violence:    Fear of current or ex partner: Not on file    Emotionally abused: Not on file    Physically abused: Not on file    Forced sexual activity: Not on file  Other Topics Concern  . Not on file  Social History Narrative  . Not on file    History reviewed. No pertinent family history.  BP 139/75   Pulse (!) 105   Ht 6\' 1"  (1.854 m)   Wt 206 lb (93.4 kg)   BMI 27.18 kg/m   Review of Systems: See HPI above.     Objective:  Physical Exam:  Gen: NAD, comfortable in exam room  Left hand/wrist: No deformity, swelling, bruising, malrotation or angulation.  No catching. FROM with 5/5 strength thumb at IP and MCP joints. Collateral ligaments intact of thumb. No tenderness to palpation. NVI  distally.   Assessment & Plan:  1. Left trigger thumb - resolved following injection.  F/u prn.

## 2018-11-23 ENCOUNTER — Emergency Department (HOSPITAL_BASED_OUTPATIENT_CLINIC_OR_DEPARTMENT_OTHER): Payer: BLUE CROSS/BLUE SHIELD

## 2018-11-23 ENCOUNTER — Emergency Department (HOSPITAL_BASED_OUTPATIENT_CLINIC_OR_DEPARTMENT_OTHER)
Admission: EM | Admit: 2018-11-23 | Discharge: 2018-11-23 | Disposition: A | Discharge status: Home / Self Care | Payer: BLUE CROSS/BLUE SHIELD | Attending: Emergency Medicine | Admitting: Emergency Medicine

## 2018-11-23 ENCOUNTER — Encounter (HOSPITAL_BASED_OUTPATIENT_CLINIC_OR_DEPARTMENT_OTHER): Payer: Self-pay | Admitting: Emergency Medicine

## 2018-11-23 ENCOUNTER — Other Ambulatory Visit: Payer: Self-pay

## 2018-11-23 DIAGNOSIS — Y929 Unspecified place or not applicable: Secondary | ICD-10-CM | POA: Insufficient documentation

## 2018-11-23 DIAGNOSIS — Z7984 Long term (current) use of oral hypoglycemic drugs: Secondary | ICD-10-CM | POA: Insufficient documentation

## 2018-11-23 DIAGNOSIS — Z87891 Personal history of nicotine dependence: Secondary | ICD-10-CM | POA: Diagnosis not present

## 2018-11-23 DIAGNOSIS — Y9389 Activity, other specified: Secondary | ICD-10-CM | POA: Insufficient documentation

## 2018-11-23 DIAGNOSIS — E119 Type 2 diabetes mellitus without complications: Secondary | ICD-10-CM | POA: Insufficient documentation

## 2018-11-23 DIAGNOSIS — Y998 Other external cause status: Secondary | ICD-10-CM | POA: Diagnosis not present

## 2018-11-23 DIAGNOSIS — S4991XA Unspecified injury of right shoulder and upper arm, initial encounter: Secondary | ICD-10-CM | POA: Diagnosis present

## 2018-11-23 DIAGNOSIS — I10 Essential (primary) hypertension: Secondary | ICD-10-CM | POA: Diagnosis not present

## 2018-11-23 DIAGNOSIS — Z7982 Long term (current) use of aspirin: Secondary | ICD-10-CM | POA: Diagnosis not present

## 2018-11-23 DIAGNOSIS — Z79899 Other long term (current) drug therapy: Secondary | ICD-10-CM | POA: Diagnosis not present

## 2018-11-23 DIAGNOSIS — W010XXA Fall on same level from slipping, tripping and stumbling without subsequent striking against object, initial encounter: Secondary | ICD-10-CM | POA: Diagnosis not present

## 2018-11-23 MED ORDER — IBUPROFEN 800 MG PO TABS
800.0000 mg | ORAL_TABLET | Freq: Once | ORAL | Status: AC
  Administered 2018-11-23: 800 mg via ORAL
  Filled 2018-11-23: qty 1

## 2018-11-23 NOTE — Discharge Instructions (Addendum)
Continue tylenol and motrin for pain, sling for comfort

## 2018-11-23 NOTE — ED Provider Notes (Signed)
[facility] EMERGENCY DEPARTMENT Provider Note   CSN: [id number] Arrival date & time: 11/23/18  7829     History   Chief Complaint Chief Complaint  Patient presents with  . Fall    HPI Shawn Daniels is a 61 y.o. male.  The history is provided by the patient.  Fall  This is a new problem. The current episode started less than 1 hour ago. The problem occurs constantly. The problem has not changed since onset.Pertinent negatives include no chest pain, no abdominal pain, no headaches and no shortness of breath. Associated symptoms comments: Right shoulder/scalpular pain after fall. Exacerbated by: movement. Nothing relieves the symptoms. He has tried nothing for the symptoms. The treatment provided no relief.    Past Medical History:  Diagnosis Date  . Diabetes mellitus without complication (HCC)   . GERD (gastroesophageal reflux disease)   . Hypertension     Patient Active Problem List   Diagnosis Date Noted  . Trigger finger, acquired 06/29/2018  . Mass of finger of left hand 11/01/2017  . Closed fracture of first metacarpal bone of left hand with routine healing, subsequent encounter 09/21/2017    Past Surgical History:  Procedure Laterality Date  . KNEE SURGERY    . PENILE PROSTHESIS IMPLANT    . WRIST SURGERY          Home Medications    Prior to Admission medications   Medication Sig Start Date End Date Taking? Authorizing Provider  aspirin 81 MG chewable tablet Chew by mouth daily.    [provider]  cetirizine (ZYRTEC) 10 MG tablet Take 10 mg by mouth at bedtime. 05/18/18   [provider]  dapagliflozin propanediol (FARXIGA) 10 MG TABS tablet Take 10 mg by mouth daily.    [provider]  lisinopril (PRINIVIL,ZESTRIL) 10 MG tablet Take 10 mg by mouth daily.    [provider]  omeprazole (PRILOSEC) 20 MG capsule  05/10/18   [provider]  ondansetron (ZOFRAN ODT) 4 MG disintegrating tablet Take  1 tablet (4 mg total) by mouth every 8 (eight) hours as needed for nausea. 06/01/17   Garlon Hatchet, PA-C  rosuvastatin (CRESTOR) 10 MG tablet  08/07/17   [provider]  sildenafil (REVATIO) 20 MG tablet TAKE 2-5 TABLETS BY MOUTH EVERY DAY AS NEEDED FOR SEXUAL ACTIVITY AS NEEDED 08/28/17   [provider]    Family History No family history on file.  Social History Social History   Tobacco Use  . Smoking status: Former Games developer  . Smokeless tobacco: Never Used  Substance Use Topics  . Alcohol use: Yes    Comment: occ  . Drug use: No     Allergies   Codeine; Keflex [cephalexin]; and Penicillins   Review of Systems Review of Systems  Constitutional: Negative for chills and fever.  HENT: Negative for ear pain and sore throat.   Eyes: Negative for pain and visual disturbance.  Respiratory: Negative for cough and shortness of breath.   Cardiovascular: Negative for chest pain and palpitations.  Gastrointestinal: Negative for abdominal pain and vomiting.  Genitourinary: Negative for dysuria and hematuria.  Musculoskeletal: Positive for arthralgias. Negative for back pain.  Skin: Negative for color change and rash.  Neurological: Negative for seizures, syncope and headaches.  All other systems reviewed and are negative.    Physical Exam Updated Vital Signs  ED Triage Vitals  Enc Vitals Group     BP 11/23/18 0702 (!) 163/87  Pulse Rate 11/23/18 0702 83     Resp 11/23/18 0702 17     Temp --      Temp Source 11/23/18 0702 Oral     SpO2 11/23/18 0702 96 %     Weight 11/23/18 0704 207 lb (93.9 kg)     Height 11/23/18 0704 6\' 1"  (1.854 m)     Head Circumference --      Peak Flow --      Pain Score 11/23/18 0704 9     Pain Loc --      Pain Edu? --      Excl. in GC? --     Physical Exam  Constitutional: He appears well-developed and well-nourished.  HENT:  Head: Normocephalic and atraumatic.  Eyes: Pupils are equal, round, and reactive to light.  Conjunctivae and EOM are normal.  Neck: Normal range of motion. Neck supple.  Cardiovascular: Normal rate, regular rhythm, normal heart sounds and intact distal pulses.  No murmur heard. Pulmonary/Chest: Effort normal and breath sounds normal. No respiratory distress.  Abdominal: Soft. There is no tenderness.  Musculoskeletal: He exhibits tenderness (TTP to right shoulder area/scalpular). He exhibits no edema.  Decreased ROM of RUE due to pain   Neurological: He is alert.  Decreased strength in RUE likely due to pain able to moves fingers, wrist, elbow without any issues, RUE strength distally appears intact and normal sensation throughout RUE  Skin: Skin is warm and dry.  Psychiatric: He has a normal mood and affect.  Nursing note and vitals reviewed.    ED Treatments / Results  Labs (all labs ordered are listed, but only abnormal results are displayed) Labs Reviewed - No data to display  EKG None  Radiology Dg Shoulder Right  Result Date: 11/23/2018 CLINICAL DATA:  Fall with right shoulder pain today. EXAM: RIGHT SHOULDER - 2+ VIEW COMPARISON:  None. FINDINGS: There is no evidence of fracture or dislocation. There is no evidence of arthropathy or other focal bone abnormality. Soft tissues are unremarkable. IMPRESSION: Negative. Electronically Signed   By: Paulina Fusi M.D.   On: 11/23/2018 07:23    Procedures Procedures (including critical care time)  Medications Ordered in ED Medications  ibuprofen (ADVIL,MOTRIN) tablet 800 mg (800 mg Oral Given 11/23/18 0724)     Initial Impression / Assessment and Plan / ED Course  I have reviewed the triage vital signs and the nursing notes.  Pertinent labs & imaging results that were available during my care of the patient were reviewed by me and considered in my medical decision making (see chart for details).     Shawn Daniels is a 62 year old male with history of diabetes who presents to the ED with right shoulder pain  after a fall.  Patient with normal vitals.  No fever.  Patient states that he woke up with low blood sugar today.  He states that he struck his right arm when he got lightheaded.  He feels better after he had something to eat but his right shoulder/scapular area with pain.  Patient with difficulty raising up his right arm secondary to pain.  However motor and sensation appears intact on exam of RUE.  X-rays show no acute findings.  Will place patient in a sling for comfort.  Given Motrin while in the ED.  Recommend Motrin, Tylenol at home for further pain relief.  Recommend follow-up with primary care doctor if symptoms do not improve as patient may need MRI to evaluate for any ligament strain,  muscle tear.  May need physical therapy as well.  Given return precautions and discharged from ED in good condition.  This chart was dictated using voice recognition software.  Despite best efforts to proofread,  errors can occur which can change the documentation meaning.   Final Clinical Impressions(s) / ED Diagnoses   Final diagnoses:  Right shoulder injury, initial encounter    ED Discharge Orders    None       Virgina NorfolkCuratolo, Kashden Deboy, DO 11/23/18 [phone]

## 2018-11-23 NOTE — ED Triage Notes (Signed)
C/o fall this morning due to low blood sugar. Pt has type I DM and woke up with a CBG <35. Pt unsure of events surrounding fall due to disorientation at the time but c/o R shoulder (scapular) pain. Limited ROM.

## 2018-12-02 ENCOUNTER — Encounter: Payer: Self-pay | Admitting: Sports Medicine

## 2018-12-02 ENCOUNTER — Ambulatory Visit (INDEPENDENT_AMBULATORY_CARE_PROVIDER_SITE_OTHER): Payer: BLUE CROSS/BLUE SHIELD | Admitting: Sports Medicine

## 2018-12-02 ENCOUNTER — Ambulatory Visit: Payer: Self-pay

## 2018-12-02 VITALS — BP 134/80 | Ht 73.0 in | Wt 207.0 lb

## 2018-12-02 DIAGNOSIS — S20211A Contusion of right front wall of thorax, initial encounter: Secondary | ICD-10-CM

## 2018-12-02 DIAGNOSIS — M25511 Pain in right shoulder: Secondary | ICD-10-CM | POA: Diagnosis not present

## 2018-12-02 DIAGNOSIS — S46811A Strain of other muscles, fascia and tendons at shoulder and upper arm level, right arm, initial encounter: Secondary | ICD-10-CM

## 2018-12-02 MED ORDER — METHYLPREDNISOLONE ACETATE 40 MG/ML IJ SUSP
40.0000 mg | Freq: Once | INTRAMUSCULAR | Status: AC
  Administered 2018-12-02: 40 mg via INTRA_ARTICULAR

## 2018-12-02 MED ORDER — MELOXICAM 15 MG PO TABS: 15.0000 mg | ORAL_TABLET | Freq: Every day | ORAL | 1 refills | Status: DC | PRN

## 2018-12-02 NOTE — Patient Instructions (Signed)
It was great to see you today for the office visit. I am recommending performing a home exercise program complete with shoulder exercises for 4 to 6 weeks.  We did inject her shoulder today with cortisone and a numbing medicine.  We will see if this helps decrease the amount of pain and helps improve your overall function.  You are given an abdominal belt to further add a layer of compression over your rib contusion especially while you were dancing in a flexed shoulder position.  I will see you back in 4 to 6 weeks.  If you are not improved we will talk about repeating an ultrasound versus ordering an MRI for further investigation of your supraspinatus rotator cuff partial tear that was seen today on ultrasound.

## 2018-12-02 NOTE — Assessment & Plan Note (Signed)
-   rib belt given today - conservative treatment - meloxicam 15mg  daily for 2 weeks and prn afterwards. Counseled on common and severe side effects of medication. - if develop respiratory difficulty or not improving, will order x-ray.

## 2018-12-02 NOTE — Assessment & Plan Note (Addendum)
-   POCUS performed today. Details above. ~50% tear in supraspinatus at myotendinous junction. - HEP for 4-6 weeks. Exercises taught today for strengthening. - INJECTION: Patient was given informed consent, signed copy in the chart. Appropriate time out was taken. Area prepped and draped in usual sterile fashion. 1 cc of methylprednisolone 40 mg/ml plus  3 cc of 0.5% bupivicaine without epinephrine was injected into the R shoulder subacromial space using a(n) posterolateral approach. The patient tolerated the procedure well. There were no complications. Post procedure instructions were given. - f/u in 6 weeks. If not improved, discussed merits of MRI of Shoulder. - activity as tolerated. Caution on overhead lifting >20lbs.

## 2018-12-02 NOTE — Progress Notes (Signed)
Shawn Daniels - 62 y.o. male MRN [id number]  Date of birth: 13-Nov-1956   Chief complaint: Right shoulder injury  SUBJECTIVE:    History of present illness: 62 year old male with a 9-day history of ongoing right shoulder pain after a fall.  He states that he was walking and felt like he had low blood sugar feeling lightheaded.  He lost his balance and fell impacting his right shoulder on the posterior lateral aspect.  Immediately afterwards, he noted posterior shoulder and periscapular pain.  This pain was worse with any types of movements and forward flexion and abduction.  Nothing really relieved his symptoms.  He has tried both Motrin and Tylenol with no significant relief.  He denies any associated numbness or tingling of the extremity.  No neck pain.  No weakness or loss of grip strength.   Review of systems:  As stated above   Past medical history: Diabetes mellitus, non-insulin-dependent, hypertension, gastroesophageal reflux disease, trigger finger Past surgical history: Knee arthroscopic surgery, wrist surgery Past family history: None Social history: Former smoker  Medications: Aspirin 81 mg, Zyrtec, dapagliflozin, lisinopril, omeprazole, rosuvastatin Allergies: codeine, keflex, PCN  OBJECTIVE:  Physical exam: Vital signs are reviewed. BP 134/80   Ht 6\' 1"  (1.854 m)   Wt 207 lb (93.9 kg)   BMI 27.31 kg/m   Gen.: Alert, oriented, appears stated age, in no apparent distress Integumentary: No rashes, ecchymosis, or edema Neurologic: sensation intact to light touch L4-S1 Back: no midline cervical spine tenderness, negative spurlings test Gait: normal without associated limp  Musculoskeletal: Inspection of the right shoulder demonstrates no acute abnormality.  He has tenderness palpation in the supraspinatus fossa.  No AC joint tenderness.  No biceps tenderness in the bicipital groove.  He has full range of motion in shoulder flexion, extension, abduction and adduction  with pain elicited in shoulder flexion and abduction.  Strength testing is 5 out of 5 of the rotator cuff.  Discomfort with Neer's test.  Discomfort with Hawkins test.  Negative crossarm test.  Negative O'Brien's test.  Negative empty can.  Negative speeds and Yergason's testing.  Diagnostics: Right shoulder x-rays 11/23/2018: 4 views of the right shoulder demonstrate mild AC joint osteoarthritis and are otherwise negative.  ULTRASOUND: Shoulder, right  Diagnostic complete ultrasound imaging obtained of patient's Right shoulder.  - No obvious evidence of bony deformity or osteophyte development appreciated.  - Long head of the biceps tendon: No evidence of tendon thickening, calcification, subluxation, or tearing in short or long axis views. No edema or bullseye sign.  - Subscapularis tendon: complete visualization across the width of the insertion point yielded no evidence of tendon thickening. Hyperechoic calcification noted at the myotendinous junction. Small 1cm hypoechoic fluid collection likely a hematoma also visualized in the soft tissue. - Supraspinatus tendon: complete visualization across the width of the insertion point yielded 50% tear of the supraspinatus tendon at the myotendinous junction.  - Infraspinatus and teres minor tendons: visualization across the width of the insertion points yielded calcification of the infraspinatus without tearing. - AC Joint: No evidence of joint separation, collapse, or osteophyte development appreciated. Trace effusion present.  IMPRESSION: findings consistent with partial supraspinatus tear with calcific tendinopathy of subscapularis and infraspinatus.    ASSESSMENT & PLAN: Traumatic tear of supraspinatus tendon, right, initial encounter - POCUS performed today. Details above. ~50% tear in supraspinatus at myotendinous junction. - HEP for 4-6 weeks. Exercises taught today for strengthening. - INJECTION: Patient was given informed consent, signed  copy in  the chart. Appropriate time out was taken. Area prepped and draped in usual sterile fashion. 1 cc of methylprednisolone 40 mg/ml plus  3 cc of 0.5% bupivicaine without epinephrine was injected into the R shoulder subacromial space using a(n) posterolateral approach. The patient tolerated the procedure well. There were no complications. Post procedure instructions were given. - f/u in 6 weeks. If not improved, discussed merits of MRI of Shoulder. - activity as tolerated. Caution on overhead lifting >20lbs.  Rib contusion, right, initial encounter - abdominal belt given today, rib belt was too tight and the patient didn't tolerate. - conservative treatment - meloxicam 15mg  daily for 2 weeks and prn afterwards. Counseled on common and severe side effects of medication. - if develop respiratory difficulty or not improving, will order x-ray.   Orders Placed This Encounter  Procedures  . US COMPLETE JOINT SPACE STRUCTURES UP RIGHT    Standing Status:   Future    Number of Occurrences:   1    Standing Expiration Date:   02/01/2020    Order Specific Question:   Reason for Exam (SYMPTOM  OR DIAGNOSIS REQUIRED)    Answer:   right shoulder pain    Order Specific Question:   Preferred imaging location?    Answer:   Internal    Meds ordered this encounter  Medications  . methylPREDNISolone acetate (DEPO-MEDROL) injection 40 mg  . meloxicam (MOBIC) 15 MG tablet    Sig: Take 1 tablet (15 mg total) by mouth daily as needed for pain.    Dispense:  30 tablet    Refill:  1      Gustavus MessingAJ , DO Sports Medicine Fellow [facility]

## 2018-12-08 ENCOUNTER — Ambulatory Visit: Payer: BLUE CROSS/BLUE SHIELD | Admitting: Family Medicine

## 2019-01-12 DIAGNOSIS — N138 Other obstructive and reflux uropathy: Secondary | ICD-10-CM | POA: Diagnosis not present

## 2019-01-12 DIAGNOSIS — N401 Enlarged prostate with lower urinary tract symptoms: Secondary | ICD-10-CM | POA: Diagnosis not present

## 2019-01-12 DIAGNOSIS — R358 Other polyuria: Secondary | ICD-10-CM | POA: Diagnosis not present

## 2019-01-15 DIAGNOSIS — E7800 Pure hypercholesterolemia, unspecified: Secondary | ICD-10-CM | POA: Diagnosis not present

## 2019-01-15 DIAGNOSIS — Z6827 Body mass index (BMI) 27.0-27.9, adult: Secondary | ICD-10-CM | POA: Diagnosis not present

## 2019-01-15 DIAGNOSIS — E1165 Type 2 diabetes mellitus with hyperglycemia: Secondary | ICD-10-CM | POA: Diagnosis not present

## 2019-01-15 DIAGNOSIS — Z87891 Personal history of nicotine dependence: Secondary | ICD-10-CM | POA: Diagnosis not present

## 2019-01-15 DIAGNOSIS — I10 Essential (primary) hypertension: Secondary | ICD-10-CM | POA: Diagnosis not present

## 2019-01-15 DIAGNOSIS — Z299 Encounter for prophylactic measures, unspecified: Secondary | ICD-10-CM | POA: Diagnosis not present

## 2019-05-05 ENCOUNTER — Ambulatory Visit (INDEPENDENT_AMBULATORY_CARE_PROVIDER_SITE_OTHER): Payer: Commercial Managed Care - PPO | Admitting: Family Medicine

## 2019-05-05 ENCOUNTER — Other Ambulatory Visit: Payer: Self-pay

## 2019-05-05 ENCOUNTER — Encounter: Payer: Self-pay | Admitting: Family Medicine

## 2019-05-05 VITALS — BP 160/83 | HR 91 | Ht 73.0 in | Wt 215.0 lb

## 2019-05-05 DIAGNOSIS — M79671 Pain in right foot: Secondary | ICD-10-CM | POA: Diagnosis not present

## 2019-05-05 MED ORDER — DICLOFENAC SODIUM 75 MG PO TBEC: 75.0000 mg | DELAYED_RELEASE_TABLET | Freq: Two times a day (BID) | ORAL | 1 refills | Status: DC

## 2019-05-05 NOTE — Patient Instructions (Signed)
You have an effusion of your subtalar joint - consistent with either arthritis flare or a deep gout flare. Take diclofenac 75mg  twice a day with food for 7 days then as needed. Icing 15 minutes at a time 3-4 times a day. Wear supportive shoes every time you're up and walking around. Consider walking boot, guided injection, steroid dose pack if not improving as expected. Follow up with me in 2 weeks but can be as needed if you're doing well.

## 2019-05-05 NOTE — Progress Notes (Signed)
PCP: Ignatius SpeckingVyas, Dhruv B, MD  Subjective:   HPI: Patient is a 63 y.o. male here for right foot pain.  Patient reports he started to get pain on 5/9 in medial/lateral right heel. No acute trauma or injury. Thought maybe he stepped strangely on a rock but didn't hurt with or after this. Pain has worsened and now is 0/10 at rest but 7/10 with bearing weight, sharp. Has history of gout previously. Not been running for exercise. No history of stress fracture. No skin changes, numbness.  Past Medical History:  Diagnosis Date  . Diabetes mellitus without complication (HCC)   . GERD (gastroesophageal reflux disease)   . Hypertension     Current Outpatient Medications on File Prior to Visit  Medication Sig Dispense Refill  . aspirin 81 MG chewable tablet Chew by mouth daily.    . cetirizine (ZYRTEC) 10 MG tablet Take 10 mg by mouth at bedtime.  2  . dapagliflozin propanediol (FARXIGA) 10 MG TABS tablet Take 10 mg by mouth daily.    Marland Kitchen. lisinopril (PRINIVIL,ZESTRIL) 10 MG tablet Take 10 mg by mouth daily.    Marland Kitchen. omeprazole (PRILOSEC) 20 MG capsule     . rosuvastatin (CRESTOR) 10 MG tablet     . sildenafil (REVATIO) 20 MG tablet TAKE 2-5 TABLETS BY MOUTH EVERY DAY AS NEEDED FOR SEXUAL ACTIVITY AS NEEDED  10   No current facility-administered medications on file prior to visit.     Past Surgical History:  Procedure Laterality Date  . KNEE SURGERY    . PENILE PROSTHESIS IMPLANT    . WRIST SURGERY      Allergies  Allergen Reactions  . Codeine   . Keflex [Cephalexin]   . Penicillins     Social History   Socioeconomic History  . Marital status: Married    Spouse name: Not on file  . Number of children: Not on file  . Years of education: Not on file  . Highest education level: Not on file  Occupational History  . Not on file  Social Needs  . Financial resource strain: Not on file  . Food insecurity:    Worry: Not on file    Inability: Not on file  . Transportation needs:   Medical: Not on file    Non-medical: Not on file  Tobacco Use  . Smoking status: Former Games developermoker  . Smokeless tobacco: Never Used  Substance and Sexual Activity  . Alcohol use: Yes    Comment: occ  . Drug use: No  . Sexual activity: Not on file  Lifestyle  . Physical activity:    Days per week: Not on file    Minutes per session: Not on file  . Stress: Not on file  Relationships  . Social connections:    Talks on phone: Not on file    Gets together: Not on file    Attends religious service: Not on file    Active member of club or organization: Not on file    Attends meetings of clubs or organizations: Not on file    Relationship status: Not on file  . Intimate partner violence:    Fear of current or ex partner: Not on file    Emotionally abused: Not on file    Physically abused: Not on file    Forced sexual activity: Not on file  Other Topics Concern  . Not on file  Social History Narrative  . Not on file    History reviewed. No pertinent family  history.  BP (!) 160/83   Pulse 91   Ht 6\' 1"  (1.854 m)   Wt 215 lb (97.5 kg)   BMI 28.37 kg/m   Review of Systems: See HPI above.     Objective:  Physical Exam:  Gen: NAD, comfortable in exam room  Right foot/ankle: No gross deformity, swelling, ecchymoses FROM with 5/5 strength all directions. TTP medial and lateral calcaneus, greatest over subtalar joint.  No achilles, plantar fascia pain. Negative calcaneal squeeze. Negative ant drawer and talar tilt.   Negative syndesmotic compression. Thompsons test negative. NV intact distally.  Left foot/ankle: No deformity. FROM with 5/5 strength. No tenderness to palpation. NVI distally.   MSK u/s right foot:  Achilles tendon appears normal.  Minimal retrocalcaneal bursitis.  Plantar fascia appears normal as well.  No cortical irregularities.  Effusion noted subtalar joint medially - no obvious crystals.   Assessment & Plan:  1. Right foot pain - consistent with  arthropathy vs gouty flare of subtalar joint.  Start with diclofenac, icing.  Offered boot - he would like to try supportive shoes first.  Consider this, guided injection, steroid dose pack if not improving as expected.  F/u in 2 weeks.

## 2019-05-12 ENCOUNTER — Telehealth: Payer: Self-pay | Admitting: Family Medicine

## 2019-05-12 MED ORDER — PREDNISONE 10 MG PO TABS: ORAL_TABLET | ORAL | 0 refills | Status: DC

## 2019-05-12 NOTE — Telephone Encounter (Signed)
Sent it in for him.  He should not take aleve or ibuprofen either.  He can take tylenol and use topical stuff with this though.

## 2019-05-12 NOTE — Telephone Encounter (Signed)
Patient left a voicemail stating that his pain has not improved. He believes he has plantar fasciitis versus gout. Patient is asking for stronger medication to help with his pain.

## 2019-05-12 NOTE — Telephone Encounter (Signed)
Patient would like to go ahead with the steroid dose pack. Requesting same pharmacy: [facility] on [address]  He asked if he should take Advil or Ibuprofen in addition to the steroid dose pack

## 2019-05-12 NOTE — Telephone Encounter (Signed)
We talked about steroid dose pack - does he want to go ahead with this?  He would stop the diclofenac while on it, take the steroid for 6 days as directed, then he could go back on the diclofenac.  We also talked about wearing a boot, ultrasound guided injection.  I'd probably try the steroid first.  Let me know!

## 2019-05-19 ENCOUNTER — Encounter: Payer: Self-pay | Admitting: Family Medicine

## 2019-05-19 ENCOUNTER — Other Ambulatory Visit: Payer: Self-pay

## 2019-05-19 ENCOUNTER — Ambulatory Visit (INDEPENDENT_AMBULATORY_CARE_PROVIDER_SITE_OTHER): Payer: Commercial Managed Care - PPO | Admitting: Family Medicine

## 2019-05-19 VITALS — Ht 73.0 in | Wt 213.0 lb

## 2019-05-19 DIAGNOSIS — M79671 Pain in right foot: Secondary | ICD-10-CM

## 2019-05-19 MED ORDER — METHYLPREDNISOLONE ACETATE 40 MG/ML IJ SUSP
40.0000 mg | Freq: Once | INTRAMUSCULAR | Status: AC
  Administered 2019-05-19: 40 mg via INTRA_ARTICULAR

## 2019-05-19 NOTE — Progress Notes (Signed)
PCP: Ignatius Specking, MD  Subjective:   HPI: Patient is a 63 y.o. male here for right foot pain.  5/13: Patient reports he started to get pain on 5/9 in medial/lateral right heel. No acute trauma or injury. Thought maybe he stepped strangely on a rock but didn't hurt with or after this. Pain has worsened and now is 0/10 at rest but 7/10 with bearing weight, sharp. Has history of gout previously. Not been running for exercise. No history of stress fracture. No skin changes, numbness.  5/27: Patient reports he feels mildly better compared to last visit. He took the prednisone which seemed to help. He does still get throbbing in the middle of the night and pain is currently a 4 out of 10 level. He is using an Ace wrap, icing. Most of his pain is around the heel. No skin changes or numbness.  Past Medical History:  Diagnosis Date  . Diabetes mellitus without complication (HCC)   . GERD (gastroesophageal reflux disease)   . Hypertension     Current Outpatient Medications on File Prior to Visit  Medication Sig Dispense Refill  . aspirin 81 MG chewable tablet Chew by mouth daily.    . cetirizine (ZYRTEC) 10 MG tablet Take 10 mg by mouth at bedtime.  2  . dapagliflozin propanediol (FARXIGA) 10 MG TABS tablet Take 10 mg by mouth daily.    . diclofenac (VOLTAREN) 75 MG EC tablet Take 1 tablet (75 mg total) by mouth 2 (two) times daily. 60 tablet 1  . lisinopril (PRINIVIL,ZESTRIL) 10 MG tablet Take 10 mg by mouth daily.    . metFORMIN (GLUCOPHAGE) 500 MG tablet     . omeprazole (PRILOSEC) 20 MG capsule     . predniSONE (DELTASONE) 10 MG tablet 6 tabs po day 1, 5 tabs po day 2, 4 tabs po day 3, 3 tabs po day 4, 2 tabs po day 5, 1 tab po day 6 21 tablet 0  . rosuvastatin (CRESTOR) 10 MG tablet     . sildenafil (REVATIO) 20 MG tablet TAKE 2-5 TABLETS BY MOUTH EVERY DAY AS NEEDED FOR SEXUAL ACTIVITY AS NEEDED  10   No current facility-administered medications on file prior to visit.      Past Surgical History:  Procedure Laterality Date  . KNEE SURGERY    . PENILE PROSTHESIS IMPLANT    . WRIST SURGERY      Allergies  Allergen Reactions  . Codeine   . Keflex [Cephalexin]   . Penicillins     Social History   Socioeconomic History  . Marital status: Married    Spouse name: Not on file  . Number of children: Not on file  . Years of education: Not on file  . Highest education level: Not on file  Occupational History  . Not on file  Social Needs  . Financial resource strain: Not on file  . Food insecurity:    Worry: Not on file    Inability: Not on file  . Transportation needs:    Medical: Not on file    Non-medical: Not on file  Tobacco Use  . Smoking status: Former Games developer  . Smokeless tobacco: Never Used  Substance and Sexual Activity  . Alcohol use: Yes    Comment: occ  . Drug use: No  . Sexual activity: Not on file  Lifestyle  . Physical activity:    Days per week: Not on file    Minutes per session: Not on file  .  Stress: Not on file  Relationships  . Social connections:    Talks on phone: Not on file    Gets together: Not on file    Attends religious service: Not on file    Active member of club or organization: Not on file    Attends meetings of clubs or organizations: Not on file    Relationship status: Not on file  . Intimate partner violence:    Fear of current or ex partner: Not on file    Emotionally abused: Not on file    Physically abused: Not on file    Forced sexual activity: Not on file  Other Topics Concern  . Not on file  Social History Narrative  . Not on file    History reviewed. No pertinent family history.  Ht 6\' 1"  (1.854 m)   Wt 213 lb (96.6 kg)   BMI 28.10 kg/m   Review of Systems: See HPI above.     Objective:  Physical Exam:  Gen: NAD, comfortable in exam room  Right foot/ankle: No gross deformity, swelling, ecchymoses FROM with 5/5 strength all directions. TTP greatest over subtalar joint.   No TTP achilles, plantar fascia tenderness. Negative calcaneal squeeze. Negative ant drawer and talar tilt.   Negative syndesmotic compression. Thompsons test negative. NV intact distally.  MSK u/s right foot:  Achilles tendon normal.  Mild retrocalcaneal bursitis.  Effusion in subtalar joint but no crystals visualized.  No ankle joint effusion.  Assessment & Plan:  1. 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  He does have mild retrocalcaneal bursitis as well but no tenderness here.  He was given an injection with ultrasound guidance into the subtalar joint.  Diclofenac, icing, Ace wrap.  He will consider a walking boot with heel lifts.  He will continue his home exercises and stretches as he has been.  F/u in 1 month.  After informed written consent timeout was performed patient was seated on exam table.  Area overlying the right subtalar joint laterally prepped with alcohol swab and utilizing ultrasound guidance, patient's right subtalar joint was injected with 1-1 bupivacaine to Depo-Medrol.  Patient tolerated procedure well without immediate complications.

## 2019-05-19 NOTE — Patient Instructions (Signed)
You have pain from retrocalcaneal bursitis and subtalar joint effusion. You were given an injection into the subtalar joint today. Take diclofenac 75mg  twice a day with food as needed. Icing 15 minutes at a time 3-4 times a day. Wear supportive shoes every time you're up and walking around with heel lifts. Consider walking boot with heel lifts. Continue home exercises, stretches as you have been. Follow up with me in 1 month.

## 2019-06-14 ENCOUNTER — Ambulatory Visit: Payer: Commercial Managed Care - PPO | Admitting: Family Medicine

## 2019-07-06 ENCOUNTER — Telehealth: Payer: Self-pay | Admitting: Family Medicine

## 2019-07-06 MED ORDER — DICLOFENAC SODIUM 75 MG PO TBEC: 75.0000 mg | DELAYED_RELEASE_TABLET | Freq: Two times a day (BID) | ORAL | 1 refills | Status: DC

## 2019-07-06 NOTE — Telephone Encounter (Signed)
Sent this in for him.

## 2019-07-06 NOTE — Telephone Encounter (Signed)
Patient requesting refill of Diclofenac   Pharmacy: [facility] on [address]

## 2020-02-21 ENCOUNTER — Ambulatory Visit: Payer: Commercial Managed Care - PPO | Attending: Internal Medicine

## 2020-02-21 DIAGNOSIS — Z20822 Contact with and (suspected) exposure to covid-19: Secondary | ICD-10-CM

## 2020-02-22 LAB — NOVEL CORONAVIRUS, NAA: SARS-CoV-2, NAA: DETECTED — AB

## 2020-02-23 ENCOUNTER — Telehealth: Payer: Self-pay | Admitting: Internal Medicine

## 2020-02-23 ENCOUNTER — Telehealth: Payer: Self-pay | Admitting: Adult Health

## 2020-02-23 ENCOUNTER — Telehealth: Payer: Self-pay | Admitting: Unknown Physician Specialty

## 2020-02-23 NOTE — Telephone Encounter (Signed)
Called to discuss with Shawn Daniels about Covid symptoms and the use of bamlanivimab, a monoclonal antibody infusion for those with mild to moderate Covid symptoms and at a high risk of hospitalization.    Pt does not qualify for infusion therapy as he has asymptomatic infection. Isolation precautions discussed. Advised to contact back for consideration should he develop symptoms. Patient verbalized understanding.    Patient Active Problem List   Diagnosis Date Noted  . Traumatic tear of supraspinatus tendon, right, initial encounter 12/02/2018  . Rib contusion, right, initial encounter 12/02/2018  . Trigger finger, acquired 06/29/2018  . Mass of finger of left hand 11/01/2017  . Closed fracture of first metacarpal bone of left hand with routine healing, subsequent encounter 09/21/2017   Cyndee Brightly, NP-C [facility]

## 2020-02-23 NOTE — Telephone Encounter (Signed)
Called to discuss with patient about Covid symptoms and the use of bamlanivimab, a monoclonal antibody infusion for those with mild to moderate Covid symptoms and at a high risk of hospitalization.  Pt is qualified for this infusion at the [facility] infusion center due to Age >55 and Diabetes   Message left to call back

## 2020-02-23 NOTE — Telephone Encounter (Signed)
Called to discuss with patient about positive SARS-CoV-2, COVID symptoms and the use of bamlanivimab, a monoclonal antibody infusion for those with mild to moderate Covid symptoms and at a high risk of hospitalization.  Pt is qualified for this infusion at the [facility] infusion center due to Age >55 and Diabetes   Message left to call back. MyChart message previously sent.  William Hamburger, NP-C

## 2020-02-26 ENCOUNTER — Other Ambulatory Visit: Payer: Self-pay | Admitting: Adult Health

## 2020-02-26 MED ORDER — SODIUM CHLORIDE 0.9 % IV SOLN
700.0000 mg | Freq: Once | INTRAVENOUS | Status: AC
  Administered 2020-02-27: 09:00:00 700 mg via INTRAVENOUS
  Filled 2020-02-26: qty 700

## 2020-02-26 NOTE — Progress Notes (Signed)
  I connected by phone with Shawn Daniels on 02/26/2020 at 9:55 AM to discuss the potential use of an new treatment for mild to moderate COVID-19 viral infection in non-hospitalized patients.  This patient is a 64 y.o. male that meets the FDA criteria for Emergency Use Authorization of bamlanivimab or casirivimab\imdevimab.  Has a (+) direct SARS-CoV-2 viral test result  Has mild or moderate COVID-19   Is ? 63 years of age and weighs ? 40 kg  Is NOT hospitalized due to COVID-19  Is NOT requiring oxygen therapy or requiring an increase in baseline oxygen flow rate due to COVID-19  Is within 10 days of symptom onset  Has at least one of the high risk factor(s) for progression to severe COVID-19 and/or hospitalization as defined in EUA.  Specific high risk criteria : Diabetes   I have spoken and communicated the following to the patient or parent/caregiver:  1. FDA has authorized the emergency use of bamlanivimab and casirivimab\imdevimab for the treatment of mild to moderate COVID-19 in adults and pediatric patients with positive results of direct SARS-CoV-2 viral testing who are 74 years of age and older weighing at least 40 kg, and who are at high risk for progressing to severe COVID-19 and/or hospitalization.  2. The significant known and potential risks and benefits of bamlanivimab and casirivimab\imdevimab, and the extent to which such potential risks and benefits are unknown.  3. Information on available alternative treatments and the risks and benefits of those alternatives, including clinical trials.  4. Patients treated with bamlanivimab and casirivimab\imdevimab should continue to self-isolate and use infection control measures (e.g., wear mask, isolate, social distance, avoid sharing personal items, clean and disinfect "high touch" surfaces, and frequent handwashing) according to CDC guidelines.   5. The patient or parent/caregiver has the option to accept or refuse  bamlanivimab or casirivimab\imdevimab .  After reviewing this information with the patient, The patient agreed to proceed with receiving the bamlanimivab infusion and will be provided a copy of the Fact sheet prior to receiving the infusion.   Scheduled 02/27/20 at 0830  . Shawn Daniels 02/26/2020 9:55 AM

## 2020-02-27 ENCOUNTER — Ambulatory Visit (HOSPITAL_COMMUNITY)
Admission: RE | Admit: 2020-02-27 | Discharge: 2020-02-27 | Disposition: A | Discharge status: Home / Self Care | Payer: Commercial Managed Care - PPO | Source: Ambulatory Visit | Attending: Pulmonary Disease | Admitting: Pulmonary Disease

## 2020-02-27 DIAGNOSIS — Z23 Encounter for immunization: Secondary | ICD-10-CM | POA: Diagnosis not present

## 2020-02-27 DIAGNOSIS — Z20822 Contact with and (suspected) exposure to covid-19: Secondary | ICD-10-CM | POA: Diagnosis not present

## 2020-02-27 MED ORDER — METHYLPREDNISOLONE SODIUM SUCC 125 MG IJ SOLR: 125.0000 mg | Freq: Once | INTRAMUSCULAR | Status: DC | PRN

## 2020-02-27 MED ORDER — EPINEPHRINE 0.3 MG/0.3ML IJ SOAJ: 0.3000 mg | Freq: Once | INTRAMUSCULAR | Status: DC | PRN

## 2020-02-27 MED ORDER — SODIUM CHLORIDE 0.9 % IV SOLN: INTRAVENOUS | Status: DC | PRN

## 2020-02-27 MED ORDER — DIPHENHYDRAMINE HCL 50 MG/ML IJ SOLN: 50.0000 mg | Freq: Once | INTRAMUSCULAR | Status: DC | PRN

## 2020-02-27 MED ORDER — FAMOTIDINE IN NACL 20-0.9 MG/50ML-% IV SOLN: 20.0000 mg | Freq: Once | INTRAVENOUS | Status: DC | PRN

## 2020-02-27 MED ORDER — ALBUTEROL SULFATE HFA 108 (90 BASE) MCG/ACT IN AERS: 2.0000 | INHALATION_SPRAY | Freq: Once | RESPIRATORY_TRACT | Status: DC | PRN

## 2020-02-27 NOTE — Progress Notes (Signed)
  Diagnosis: COVID-19  Physician: Dr. Wright  Procedure: [facility] Med: bamlanivimab infusion - Provided patient with bamlanimivab fact sheet for patients, parents and caregivers prior to infusion.  Complications: No immediate complications noted.  Discharge: Discharged home   Raidyn Breiner 02/27/2020   

## 2020-02-27 NOTE — Discharge Instructions (Signed)
COVID-19 COVID-19 is a respiratory infection that is caused by a virus called severe acute respiratory syndrome coronavirus 2 (SARS-CoV-2). The disease is also known as coronavirus disease or novel coronavirus. In some people, the virus may not cause any symptoms. In others, it may cause a serious infection. The infection can get worse quickly and can lead to complications, such as:  Pneumonia, or infection of the lungs.  Acute respiratory distress syndrome or ARDS. This is a condition in which fluid build-up in the lungs prevents the lungs from filling with air and passing oxygen into the blood.  Acute respiratory failure. This is a condition in which there is not enough oxygen passing from the lungs to the body or when carbon dioxide is not passing from the lungs out of the body.  Sepsis or septic shock. This is a serious bodily reaction to an infection.  Blood clotting problems.  Secondary infections due to bacteria or fungus.  Organ failure. This is when your body's organs stop working. The virus that causes COVID-19 is contagious. This means that it can spread from person to person through droplets from coughs and sneezes (respiratory secretions). What are the causes? This illness is caused by a virus. You may catch the virus by:  Breathing in droplets from an infected person. Droplets can be spread by a person breathing, speaking, singing, coughing, or sneezing.  Touching something, like a table or a doorknob, that was exposed to the virus (contaminated) and then touching your mouth, nose, or eyes. What increases the risk? Risk for infection You are more likely to be infected with this virus if you:  Are within 6 feet (2 meters) of a person with COVID-19.  Provide care for or live with a person who is infected with COVID-19.  Spend time in crowded indoor spaces or live in shared housing. Risk for serious illness You are more likely to become seriously ill from the virus if you:   Are 50 years of age or older. The higher your age, the more you are at risk for serious illness.  Live in a nursing home or long-term care facility.  Have cancer.  Have a long-term (chronic) disease such as: ? Chronic lung disease, including chronic obstructive pulmonary disease or asthma. ? A long-term disease that lowers your body's ability to fight infection (immunocompromised). ? Heart disease, including heart failure, a condition in which the arteries that lead to the heart become narrow or blocked (coronary artery disease), a disease which makes the heart muscle thick, weak, or stiff (cardiomyopathy). ? Diabetes. ? Chronic kidney disease. ? Sickle cell disease, a condition in which red blood cells have an abnormal "sickle" shape. ? Liver disease.  Are obese. What are the signs or symptoms? Symptoms of this condition can range from mild to severe. Symptoms may appear any time from 2 to 14 days after being exposed to the virus. They include:  A fever or chills.  A cough.  Difficulty breathing.  Headaches, body aches, or muscle aches.  Runny or stuffy (congested) nose.  A sore throat.  New loss of taste or smell. Some people may also have stomach problems, such as nausea, vomiting, or diarrhea. Other people may not have any symptoms of COVID-19. How is this diagnosed? This condition may be diagnosed based on:  Your signs and symptoms, especially if: ? You live in an area with a COVID-19 outbreak. ? You recently traveled to or from an area where the virus is common. ? You   provide care for or live with a person who was diagnosed with COVID-19. ? You were exposed to a person who was diagnosed with COVID-19.  A physical exam.  Lab tests, which may include: ? Taking a sample of fluid from the back of your nose and throat (nasopharyngeal fluid), your nose, or your throat using a swab. ? A sample of mucus from your lungs (sputum). ? Blood tests.  Imaging tests, which  may include, X-rays, CT scan, or ultrasound. How is this treated? At present, there is no medicine to treat COVID-19. Medicines that treat other diseases are being used on a trial basis to see if they are effective against COVID-19. Your health care provider will talk with you about ways to treat your symptoms. For most people, the infection is mild and can be managed at home with rest, fluids, and over-the-counter medicines. Treatment for a serious infection usually takes places in a hospital intensive care unit (ICU). It may include one or more of the following treatments. These treatments are given until your symptoms improve.  Receiving fluids and medicines through an IV.  Supplemental oxygen. Extra oxygen is given through a tube in the nose, a face mask, or a hood.  Positioning you to lie on your stomach (prone position). This makes it easier for oxygen to get into the lungs.  Continuous positive airway pressure (CPAP) or bi-level positive airway pressure (BPAP) machine. This treatment uses mild air pressure to keep the airways open. A tube that is connected to a motor delivers oxygen to the body.  Ventilator. This treatment moves air into and out of the lungs by using a tube that is placed in your windpipe.  Tracheostomy. This is a procedure to create a hole in the neck so that a breathing tube can be inserted.  Extracorporeal membrane oxygenation (ECMO). This procedure gives the lungs a chance to recover by taking over the functions of the heart and lungs. It supplies oxygen to the body and removes carbon dioxide. Follow these instructions at home: Lifestyle  If you are sick, stay home except to get medical care. Your health care provider will tell you how long to stay home. Call your health care provider before you go for medical care.  Rest at home as told by your health care provider.  Do not use any products that contain nicotine or tobacco, such as cigarettes, e-cigarettes, and  chewing tobacco. If you need help quitting, ask your health care provider.  Return to your normal activities as told by your health care provider. Ask your health care provider what activities are safe for you. General instructions  Take over-the-counter and prescription medicines only as told by your health care provider.  Drink enough fluid to keep your urine pale yellow.  Keep all follow-up visits as told by your health care provider. This is important. How is this prevented?  There is no vaccine to help prevent COVID-19 infection. However, there are steps you can take to protect yourself and others from this virus. To protect yourself:   Do not travel to areas where COVID-19 is a risk. The areas where COVID-19 is reported change often. To identify high-risk areas and travel restrictions, check the CDC travel website: wwwnc.cdc.gov/travel/notices  If you live in, or must travel to, an area where COVID-19 is a risk, take precautions to avoid infection. ? Stay away from people who are sick. ? Wash your hands often with soap and water for 20 seconds. If soap and water   are not available, use an alcohol-based hand sanitizer. ? Avoid touching your mouth, face, eyes, or nose. ? Avoid going out in public, follow guidance from your state and local health authorities. ? If you must go out in public, wear a cloth face covering or face mask. Make sure your mask covers your nose and mouth. ? Avoid crowded indoor spaces. Stay at least 6 feet (2 meters) away from others. ? Disinfect objects and surfaces that are frequently touched every day. This may include:  Counters and tables.  Doorknobs and light switches.  Sinks and faucets.  Electronics, such as phones, remote controls, keyboards, computers, and tablets. To protect others: If you have symptoms of COVID-19, take steps to prevent the virus from spreading to others.  If you think you have a COVID-19 infection, contact your health care  provider right away. Tell your health care team that you think you may have a COVID-19 infection.  Stay home. Leave your house only to seek medical care. Do not use public transport.  Do not travel while you are sick.  Wash your hands often with soap and water for 20 seconds. If soap and water are not available, use alcohol-based hand sanitizer.  Stay away from other members of your household. Let healthy household members care for children and pets, if possible. If you have to care for children or pets, wash your hands often and wear a mask. If possible, stay in your own room, separate from others. Use a different bathroom.  Make sure that all people in your household wash their hands well and often.  Cough or sneeze into a tissue or your sleeve or elbow. Do not cough or sneeze into your hand or into the air.  Wear a cloth face covering or face mask. Make sure your mask covers your nose and mouth. Where to find more information  Centers for Disease Control and Prevention: www.cdc.gov/coronavirus/2019-ncov/index.html  World Health Organization: www.who.int/health-topics/coronavirus Contact a health care provider if:  You live in or have traveled to an area where COVID-19 is a risk and you have symptoms of the infection.  You have had contact with someone who has COVID-19 and you have symptoms of the infection. Get help right away if:  You have trouble breathing.  You have pain or pressure in your chest.  You have confusion.  You have bluish lips and fingernails.  You have difficulty waking from sleep.  You have symptoms that get worse. These symptoms may represent a serious problem that is an emergency. Do not wait to see if the symptoms will go away. Get medical help right away. Call your local emergency services (911 in the [place] Do not drive yourself to the hospital. Let the emergency medical personnel know if you think you have COVID-19. Summary  COVID-19 is a  respiratory infection that is caused by a virus. It is also known as coronavirus disease or novel coronavirus. It can cause serious infections, such as pneumonia, acute respiratory distress syndrome, acute respiratory failure, or sepsis.  The virus that causes COVID-19 is contagious. This means that it can spread from person to person through droplets from breathing, speaking, singing, coughing, or sneezing.  You are more likely to develop a serious illness if you are 50 years of age or older, have a weak immune system, live in a nursing home, or have chronic disease.  There is no medicine to treat COVID-19. Your health care provider will talk with you about ways to treat your symptoms.    Take steps to protect yourself and others from infection. Wash your hands often and disinfect objects and surfaces that are frequently touched every day. Stay away from people who are sick and wear a mask if you are sick. This information is not intended to replace advice given to you by your health care provider. Make sure you discuss any questions you have with your health care provider. Document Revised: 10/08/2019 Document Reviewed: 01/14/2019 Elsevier Patient Education  2020 [employer]  

## 2020-06-19 ENCOUNTER — Other Ambulatory Visit: Payer: Self-pay

## 2020-06-19 ENCOUNTER — Ambulatory Visit: Payer: Managed Care, Other (non HMO) | Admitting: Family Medicine

## 2020-06-19 ENCOUNTER — Encounter: Payer: Self-pay | Admitting: Family Medicine

## 2020-06-19 VITALS — BP 149/62 | Ht 73.0 in | Wt 199.0 lb

## 2020-06-19 DIAGNOSIS — M6789 Other specified disorders of synovium and tendon, multiple sites: Secondary | ICD-10-CM

## 2020-06-19 MED ORDER — NITROGLYCERIN 0.2 MG/HR TD PT24: MEDICATED_PATCH | TRANSDERMAL | 1 refills | Status: DC

## 2020-06-19 NOTE — Patient Instructions (Signed)
You have a partial tear of your common extensor tendon. Try to avoid painful activities as much as possible. Ice the area 3-4 times a day for 15 minutes at a time. Tylenol or aleve as needed for pain. Nitro 1/4th patch to affected area and change daily. Counterforce brace as directed can help unload area - wear this regularly if it provides you with relief. A wrist brace will help rest this also. Wait about a week before you start the hammer rotation exercise, wrist extension exercise with 1 pound weight - 3 sets of 10 once a day.   Stretching - hold for 20-30 seconds and repeat 3 times. Consider physical therapy if not improving. Follow up in 6 weeks.

## 2020-06-19 NOTE — Assessment & Plan Note (Signed)
Right-sided common extensor tendon tear confirmed by ultrasound, event reported 7 weeks ago.    We will try one quarter Nitropatch x6 weeks, counterforce brace on right elbow with wrist brace, over-the-counter ibuprofen and home therapy as patient does not want formal physical therapy due to work schedule

## 2020-06-19 NOTE — Progress Notes (Signed)
    SUBJECTIVE:   CHIEF COMPLAINT / HPI: Right elbow pain x7 weeks  Patient with lateral right elbow pain x7 weeks.  Occurred as a sudden "pop "when he was doing bicep curls.  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  He says he does have a remote history as a child of a elbow fracture on that side which did not heal right and has had a abnormal bony prominence on the lateral elbow since then.  He has had normal and full function with the exception of the ability to obtain full flexion with supination of the wrist.  Since then he feels he does have slightly decreased grip strength in the right hand but has no sensation deficits.  There has been no significant swelling in the right elbow although there has been pain focused on the lateral side.  Has had no shoulder involvement and while he has pain with extension of the wrist he is still able to do so.  PERTINENT  PMH / PSH:   OBJECTIVE:   BP (!) 149/62   Ht 6\' 1"  (1.854 m)   Wt 199 lb (90.3 kg)   BMI 26.25 kg/m   General: Alert and athletic, no medical distress  Right elbow: Inspection: No skin changes, there is a lateral bony prominence asymmetric with the left Palpation: No palpable effusion or erythema.  TTP over lateral epicondyle. ROM: FROM. Strength: Normal flexion and extension strength of the elbow.  Reduced asymmetric right hand grip over left.  Pain with resisted wrist extension and 3rd digit extension. Stability: No instability noted with valgus or varus. Neurovascular: NVI intact.  Limited MSK u/s:  Partial thickness insertional tear of common extensor tendon of right elbow with hypoechoic change of the tendon.  ASSESSMENT/PLAN:   Right elbow pain Right-sided partial common extensor tendon tear confirmed by ultrasound, event reported 7 weeks ago.    We will try one quarter Nitropatch x 6 weeks, counterforce brace on right elbow with wrist brace, over-the-counter ibuprofen and  home therapy as patient does not want formal physical therapy due to work schedule  , DO [facility]

## 2020-07-31 ENCOUNTER — Ambulatory Visit: Payer: Managed Care, Other (non HMO) | Admitting: Family Medicine

## 2020-07-31 ENCOUNTER — Other Ambulatory Visit: Payer: Self-pay

## 2020-07-31 VITALS — BP 136/70 | Ht 73.0 in | Wt 198.0 lb

## 2020-07-31 DIAGNOSIS — M25521 Pain in right elbow: Secondary | ICD-10-CM | POA: Diagnosis not present

## 2020-07-31 NOTE — Progress Notes (Signed)
PCP: Ignatius Specking, MD  Subjective:   HPI: Patient is a 64 y.o. male here for follow-up of right elbow pain. He states he is about 30% improved from the last visit, but he is still frustrated that his right hand is weaker than his left. He states he does not have pain at rest and only feels pain with certain movements or when he picks something up. States the pain occasionally radiates down his right arm. He states the nitro patches have helped a little - using 1/2 patch. He takes tylenol and ibuprofen occasionally for the pain.   He has been doing the grip and wrist extension exercises when he drives. He states he works full-time and does not have time to take off for formal PT. He wonders if having a note for light duty may help, but he says he has been going easy on his right arm whenever he has to lift something for work.   Past Medical History:  Diagnosis Date   Diabetes mellitus without complication (HCC)    GERD (gastroesophageal reflux disease)    Hypertension     Current Outpatient Medications on File Prior to Visit  Medication Sig Dispense Refill   ACCU-CHEK GUIDE test strip USE 1 TEST STRIP TWICE DAILY     ascorbic acid (VITAMIN C) 500 MG tablet Take by mouth.     aspirin 81 MG chewable tablet Chew by mouth daily.     cetirizine (ZYRTEC) 10 MG tablet Take 10 mg by mouth at bedtime.  2   dapagliflozin propanediol (FARXIGA) 10 MG TABS tablet Take 10 mg by mouth daily.     diclofenac (VOLTAREN) 75 MG EC tablet Take 1 tablet (75 mg total) by mouth 2 (two) times daily. 60 tablet 1   GLYXAMBI 25-5 MG TABS Take 1 tablet by mouth daily.     lisinopril (PRINIVIL,ZESTRIL) 10 MG tablet Take 10 mg by mouth daily.     metFORMIN (GLUCOPHAGE) 500 MG tablet      nitroGLYCERIN (NITRODUR - DOSED IN MG/24 HR) 0.2 mg/hr patch Apply 1/4th patch to affected elbow, change daily 30 patch 1   NOVOLIN 70/30 RELION (70-30) 100 UNIT/ML injection SMARTSIG:30 Unit(s) SUB-Q Twice Daily      omeprazole (PRILOSEC) 20 MG capsule      RELION INSULIN SYRINGE 31G X 15/64" 0.5 ML MISC USE 1 THREE TIMES DAILY     rosuvastatin (CRESTOR) 10 MG tablet      No current facility-administered medications on file prior to visit.    Past Surgical History:  Procedure Laterality Date   KNEE SURGERY     PENILE PROSTHESIS IMPLANT     WRIST SURGERY      Allergies  Allergen Reactions   Codeine    Keflex [Cephalexin]    Penicillins     Social History   Socioeconomic History   Marital status: Married    Spouse name: Not on file   Number of children: Not on file   Years of education: Not on file   Highest education level: Not on file  Occupational History   Not on file  Tobacco Use   Smoking status: Former Smoker   Smokeless tobacco: Never Used  Building services engineer Use: Never used  Substance and Sexual Activity   Alcohol use: Yes    Comment: occ   Drug use: No   Sexual activity: Not on file  Other Topics Concern   Not on file  Social History Narrative  Not on file   Social Determinants of Health   Financial Resource Strain:    Difficulty of Paying Living Expenses:   Food Insecurity:    Worried About Programme researcher, broadcasting/film/video in the Last Year:    Barista in the Last Year:   Transportation Needs:    Freight forwarder (Medical):    Lack of Transportation (Non-Medical):   Physical Activity:    Days of Exercise per Week:    Minutes of Exercise per Session:   Stress:    Feeling of Stress :   Social Connections:    Frequency of Communication with Friends and Family:    Frequency of Social Gatherings with Friends and Family:    Attends Religious Services:    Active Member of Clubs or Organizations:    Attends Engineer, structural:    Marital Status:   Intimate Partner Violence:    Fear of Current or Ex-Partner:    Emotionally Abused:    Physically Abused:    Sexually Abused:     No family history on  file.  BP 136/70    Ht 6\' 1"  (1.854 m)    Wt 198 lb (89.8 kg)    BMI 26.12 kg/m   Review of Systems: See HPI above.     Objective:  Physical Exam:  Gen: NAD, comfortable in exam room Resp: Breathing comfortably at rest. Right Elbow: -No visible swelling or bruising, although there is a lateral bony prominence that protrudes more than on the left elbow -Minimal discomfort with palpation of lateral epicondyle. Otherwise no TTP elsewhere. -FROM -Minimally reduced right hand grip compared to left. Discomfort with active wrist extension and wrist extension to resistance. No pain with finger extension.  Strength 5/5 with these motions. -Neurovascularly intact.   Assessment & Plan:  1. Right elbow pain Right common extensor tendon partial tear confirmed by ultrasound at last visit on 06/19/20. His elbow pain is much improved, but his right hand strength is not fully back to normal. He does not want to start formal PT yet given his work schedule and states he will message in 2 weeks depending on his progress. For now, plan to continue the nitro patches, counterforce brace with wrist brace, ibuprofen and tylenol as needed, and home exercises.  Follow-up as needed.

## 2020-08-01 ENCOUNTER — Encounter: Payer: Self-pay | Admitting: Family Medicine

## 2021-07-30 ENCOUNTER — Ambulatory Visit: Payer: Self-pay | Admitting: Cardiology

## 2021-08-01 ENCOUNTER — Other Ambulatory Visit: Payer: Self-pay

## 2021-08-01 ENCOUNTER — Ambulatory Visit: Payer: Managed Care, Other (non HMO) | Admitting: Cardiology

## 2021-08-01 ENCOUNTER — Encounter: Payer: Self-pay | Admitting: Cardiology

## 2021-08-01 VITALS — BP 132/70 | HR 92 | Temp 98.4°F | Resp 16 | Ht 73.0 in | Wt 205.0 lb

## 2021-08-01 DIAGNOSIS — E7800 Pure hypercholesterolemia, unspecified: Secondary | ICD-10-CM

## 2021-08-01 DIAGNOSIS — Z794 Long term (current) use of insulin: Secondary | ICD-10-CM

## 2021-08-01 DIAGNOSIS — R0989 Other specified symptoms and signs involving the circulatory and respiratory systems: Secondary | ICD-10-CM

## 2021-08-01 DIAGNOSIS — I10 Essential (primary) hypertension: Secondary | ICD-10-CM

## 2021-08-01 DIAGNOSIS — E119 Type 2 diabetes mellitus without complications: Secondary | ICD-10-CM

## 2021-08-01 DIAGNOSIS — I739 Peripheral vascular disease, unspecified: Secondary | ICD-10-CM

## 2021-08-01 NOTE — Progress Notes (Signed)
 Primary Physician/Referring:  Vyas, Dhruv B, MD  Patient ID: Shawn Daniels, male    DOB: 02/27/1956, 64 y.o.   MRN: [id number]  Chief Complaint  Patient presents with   Shortness of Breath   New Patient (Initial Visit)    Referred by Dhruv Vyas, MD   HPI:    Shawn Daniels  is a 64 y.o. caucasian male with a past medical history significant for type 2 diabetes mellitus, hypertension, hyperlipidemia, GERD, and a roughly 40 pack year smoking history. He presents today by referral from Dr. Vyas for evaluation. 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   He presents today with the chief concern of wanting evaluation of his cardiac risk factors. He states that his father had a pacemaker and he wants to avoid cardiac events and the need for a pacemaker. He reports that he has decreased energy compared to the past. He also reports dizziness with head movements to the left that started 1 year ago. This does not occur every day and he primarily notices it when he is walking.  He also describes that both of his calves get very tight when he dances or walks and it only improves with rest. He is concerned that these symptoms may have a cardiac etiology and wants to be evaluated. Otherwise he denies chest pain, syncope, SOB, palpitations, or difficulty breathing.  The patient describes himself as "health conscious." He is very active and walks 20,000 steps per day and dances for 52 hours a week. He is on Rosuvastatin for elevated cholesterol, however he states that he is only taking 15 mg despite being prescribed 20 mg because he prefers to titrate up slowly with his medications.   The patient endorses that he smoked 15 packs of cigarettes per week for 25-30 years. He is no longer smoking. He also endorses drinking 3 beers per day or more.   Past Medical History:  Diagnosis Date   Diabetes mellitus without  complication (HCC)    GERD (gastroesophageal reflux disease)    Hypertension    Past Surgical History:  Procedure Laterality Date   KNEE SURGERY     PENILE PROSTHESIS IMPLANT     WRIST SURGERY     Family History  Problem Relation Age of Onset   Heart failure Father     Social History   Tobacco Use   Smoking status: Former    Packs/day: 1.50    Years: 30.00    Pack years: 45.00    Types: Cigarettes, Cigars    Quit date: 2009    Years since quitting: 13.6   Smokeless tobacco: Never  Substance Use Topics   Alcohol use: Yes    Alcohol/week: 3.0 standard drinks    Types: 3 Cans of beer per week    Comment: daily   Marital Status: Married  ROS  Review of Systems  Cardiovascular:  Positive for claudication. Negative for chest pain, dyspnea on exertion, irregular heartbeat, leg swelling, near-syncope, orthopnea, palpitations and syncope.  Respiratory:  Negative for cough, shortness of breath and sleep disturbances due to breathing.   Gastrointestinal:  Negative for hematochezia and melena.  Genitourinary:  Negative for hematuria.  Objective  Blood pressure 132/70, pulse 92, temperature 98.4 F (36.9 C), temperature source Temporal, resp. rate 16, height 6' 1" (1.854 m), weight 93 kg, SpO2 95 %. Body mass index is 27.05 kg/m.  Vitals with BMI 08/01/2021 07/31/2020 06/19/2020    Height 6' 1" 6' 1" 6' 1"  Weight 205 lbs 198 lbs 199 lbs  BMI 27.05 26.13 26.26  Systolic 132 136 149  Diastolic 70 70 62  Pulse 92 - -    Physical Exam Constitutional:      General: He is not in acute distress.    Appearance: He is normal weight.  Neck:     Vascular: Carotid bruit (bilateral) present.  Cardiovascular:     Rate and Rhythm: Normal rate. Rhythm irregular.     Pulses:          Carotid pulses are 2+ on the right side and 2+ on the left side.      Radial pulses are 2+ on the right side and 2+ on the left side.       Femoral pulses are 2+ on the right side and 2+ on the left side.       Popliteal pulses are 2+ on the right side and 2+ on the left side.       Dorsalis pedis pulses are 1+ on the right side and 1+ on the left side.       Posterior tibial pulses are 2+ on the right side and 2+ on the left side.     Heart sounds: Normal heart sounds. No murmur heard.   No gallop.     Comments: Frequent PACs heard on auscultation  Pulmonary:     Effort: Pulmonary effort is normal. No respiratory distress.     Breath sounds: Normal breath sounds. No wheezing, rhonchi or rales.  Abdominal:     General: Bowel sounds are normal.     Palpations: Abdomen is soft.     Tenderness: There is no abdominal tenderness.  Musculoskeletal:     Right lower leg: No edema.     Left lower leg: No edema.  Skin:    Comments: Bilateral lower leg moderate varicosities   Neurological:     Mental Status: He is alert.     Laboratory examination:   No results for input(s): NA, K, CL, CO2, GLUCOSE, BUN, CREATININE, CALCIUM, GFRNONAA, GFRAA in the last 8760 hours. CrCl cannot be calculated (Patient's most recent lab result is older than the maximum 21 days allowed.).  CMP Latest Ref Rng & Units 06/01/2017  Glucose 65 - 99 mg/dL 249(H)  BUN 6 - 20 mg/dL 15  Creatinine 0.61 - 1.24 mg/dL 0.96  Sodium 135 - 145 mmol/L 135  Potassium 3.5 - 5.1 mmol/L 4.3  Chloride 101 - 111 mmol/L 99(L)  CO2 22 - 32 mmol/L 26  Calcium 8.9 - 10.3 mg/dL 9.6   CBC Latest Ref Rng & Units 06/01/2017 08/31/2009  WBC 4.0 - 10.5 K/uL 4.1 -  Hemoglobin 13.0 - 17.0 g/dL 15.1 14.2  Hematocrit 39.0 - 52.0 % 42.7 -  Platelets 150 - 400 K/uL 146(L) -    Lipid Panel No results for input(s): CHOL, TRIG, LDLCALC, VLDL, HDL, CHOLHDL, LDLDIRECT in the last 8760 hours. Lipid Panel  No results found for: CHOL, TRIG, HDL, CHOLHDL, VLDL, LDLCALC, LDLDIRECT, LABVLDL   HEMOGLOBIN A1C No results found for: HGBA1C, MPG TSH No results for input(s): TSH in the last 8760 hours.  External labs:  Labs 07/03/2021:  Hb 14.4/HCT 42.2,  platelets 145, normal indicis.  TSH normal at 1.490. A1c 6.2%.  Total cholesterol 222, triglycerides 157, HDL 61, LDL 133.  Serum glucose 61 mg, BUN 14, creatinine 0.75, EGFR 101 mL, potassium 4.4.  Medications and allergies   Allergies    Allergen Reactions   Codeine    Keflex [Cephalexin]    Penicillins      Medication prior to this encounter:   Outpatient Medications Prior to Visit  Medication Sig Dispense Refill   ACCU-CHEK GUIDE test strip USE 1 TEST STRIP TWICE DAILY     aspirin 81 MG chewable tablet Chew by mouth daily.     cetirizine (ZYRTEC) 10 MG tablet Take 10 mg by mouth at bedtime.  2   GLYXAMBI 25-5 MG TABS Take 1 tablet by mouth daily.     lisinopril (PRINIVIL,ZESTRIL) 10 MG tablet Take 10 mg by mouth daily.     metFORMIN (GLUCOPHAGE) 500 MG tablet      nitroGLYCERIN (NITRODUR - DOSED IN MG/24 HR) 0.2 mg/hr patch Apply 1/4th patch to affected elbow, change daily 30 patch 1   NOVOLIN 70/30 RELION (70-30) 100 UNIT/ML injection SMARTSIG:30 Unit(s) SUB-Q Twice Daily     omeprazole (PRILOSEC) 20 MG capsule      RELION INSULIN SYRINGE 31G X 15/64" 0.5 ML MISC USE 1 THREE TIMES DAILY     rosuvastatin (CRESTOR) 10 MG tablet      diclofenac (VOLTAREN) 75 MG EC tablet Take 1 tablet (75 mg total) by mouth 2 (two) times daily. (Patient not taking: Reported on 08/01/2021) 60 tablet 1   ascorbic acid (VITAMIN C) 500 MG tablet Take by mouth.     dapagliflozin propanediol (FARXIGA) 10 MG TABS tablet Take 10 mg by mouth daily.     No facility-administered medications prior to visit.     Medication list after today's encounter   Current Outpatient Medications  Medication Instructions   ACCU-CHEK GUIDE test strip USE 1 TEST STRIP TWICE DAILY   aspirin 81 MG chewable tablet Oral, Daily   cetirizine (ZYRTEC) 10 mg, Oral, Daily at bedtime   diclofenac (VOLTAREN) 75 mg, Oral, 2 times daily   GLYXAMBI 25-5 MG TABS 1 tablet, Oral, Daily   lisinopril (ZESTRIL) 10 mg, Oral, Daily    metFORMIN (GLUCOPHAGE) 500 MG tablet No dose, route, or frequency recorded.   nitroGLYCERIN (NITRODUR - DOSED IN MG/24 HR) 0.2 mg/hr patch Apply 1/4th patch to affected elbow, change daily   NOVOLIN 70/30 RELION (70-30) 100 UNIT/ML injection SMARTSIG:30 Unit(s) SUB-Q Twice Daily   omeprazole (PRILOSEC) 20 MG capsule No dose, route, or frequency recorded.   RELION INSULIN SYRINGE 31G X 15/64" 0.5 ML MISC USE 1 THREE TIMES DAILY   rosuvastatin (CRESTOR) 10 MG tablet No dose, route, or frequency recorded.    Radiology:   No results found.  Cardiac Studies:   none  EKG:   EKG 08/01/2021: Normal sinus rhythm at rate of 81 bpm, normal axis, incomplete right bundle branch block.  Single PAC otherwise normal EKG.  Assessment     ICD-10-CM   1. Bilateral carotid bruits  R09.89 PCV CAROTID DUPLEX (BILATERAL)    PCV MYOCARDIAL PERFUSION WO LEXISCAN    2. Claudication in peripheral vascular disease (HCC)  I73.9 PCV ANKLE BRACHIAL INDEX (ABI)    PCV MYOCARDIAL PERFUSION WO LEXISCAN    3. Hypercholesteremia  E78.00 CT CARDIAC SCORING ([facility] LOCATIONS ONLY)    4. Type 2 diabetes mellitus without complication, with long-term current use of insulin (HCC)  E11.9 PCV MYOCARDIAL PERFUSION WO LEXISCAN   Z79.4 CT CARDIAC SCORING ([facility] LOCATIONS ONLY)    5. Primary hypertension  I10 EKG 12-Lead       Medications Discontinued During This Encounter  Medication Reason   dapagliflozin propanediol (FARXIGA) 10 MG  TABS tablet Error   ascorbic acid (VITAMIN C) 500 MG tablet Error    No orders of the defined types were placed in this encounter.  Orders Placed This Encounter  Procedures   CT CARDIAC SCORING ([facility] LOCATIONS ONLY)    Standing Status:   Future    Standing Expiration Date:   10/01/2021    Order Specific Question:   Preferred imaging location?    Answer:   [facility]   PCV MYOCARDIAL PERFUSION WO LEXISCAN    Standing Status:   Future    Standing Expiration Date:   10/01/2021   EKG 12-Lead    Recommendations:   Shawn Daniels is a 64 y.o. caucasian male with a past medical history significant for type 2 diabetes mellitus, hypertension, hyperlipidemia, GERD, and 40 pack year smoking history. He presents today by referral from Dr. Vyas for evaluation. 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   The patient presents for cardiac risk assessment. Given the patient's type 2 diabetes, nearly 40 pack year smoking history, hypertension, and hyperlipidemia, he would be considered high risk for cardiac events despite his physically active lifestyle. Based on this, CT coronary calcium score and nuclear stress test have been ordered to further risk stratify the patient and to look for evidence of CAD. The patient agreed with this plan.   Bilateral carotid bruits were heard during the exam. A bilateral carotid duplex US has been ordered to further evaluate the carotids for stenosis.   The patient's bilateral lower leg symptomatology is concerning for claudication. Dorsalis pedis pulse was found to be diminished during the exam. These symptoms are concerning for PAD. ABI has been ordered to further evaluate the patient's peripheral perfusion.  Follow up in 6 weeks for screening results, PAD, and carotid disease.    Summer Lackey, PA-S 08/01/2021, 5:10 PM Office: [phone]  

## 2021-08-16 ENCOUNTER — Other Ambulatory Visit: Payer: Self-pay

## 2021-08-16 ENCOUNTER — Ambulatory Visit: Payer: Managed Care, Other (non HMO)

## 2021-08-16 ENCOUNTER — Ambulatory Visit
Admission: RE | Admit: 2021-08-16 | Discharge: 2021-08-16 | Disposition: A | Discharge status: Home / Self Care | Payer: No Typology Code available for payment source | Source: Ambulatory Visit | Attending: Cardiology | Admitting: Cardiology

## 2021-08-16 DIAGNOSIS — I739 Peripheral vascular disease, unspecified: Secondary | ICD-10-CM

## 2021-08-16 DIAGNOSIS — I6523 Occlusion and stenosis of bilateral carotid arteries: Secondary | ICD-10-CM

## 2021-08-16 DIAGNOSIS — E7800 Pure hypercholesterolemia, unspecified: Secondary | ICD-10-CM

## 2021-08-16 DIAGNOSIS — E119 Type 2 diabetes mellitus without complications: Secondary | ICD-10-CM

## 2021-08-16 DIAGNOSIS — R0989 Other specified symptoms and signs involving the circulatory and respiratory systems: Secondary | ICD-10-CM

## 2021-08-16 DIAGNOSIS — Z794 Long term (current) use of insulin: Secondary | ICD-10-CM

## 2021-08-20 NOTE — Progress Notes (Signed)
Coronary calcium score 08/16/2021: LM-20 LAD 167 LCx 41 RCA 394. Total [place] score 1523. MESA database percentile: 97 Ascending and descending thoracic aorta are normal in measurements.  Aortic atherosclerosis. No significant extra cardiac abnormality.

## 2021-08-21 ENCOUNTER — Telehealth: Payer: Self-pay | Admitting: Cardiology

## 2021-08-21 NOTE — Telephone Encounter (Signed)
This is the pt who had a few questions earlier & wanted to discuss. Pt at work so you can leave a VM.

## 2021-08-31 ENCOUNTER — Telehealth: Payer: Self-pay

## 2021-08-31 NOTE — Telephone Encounter (Signed)
Patient would like a call from Dr. Jacinto Halim regarding some test results he recently had done. He is very upset after speaking with a frnt desk staff member, about not following up in a timely manner. Please give him a call ASAP.  [phone].

## 2021-08-31 NOTE — Telephone Encounter (Signed)
Spoke with the patient, discussed the carotid duplex report and also ABI.  I advised him to continue aspirin and his PCP has increased his Crestor from 10 mg to 20 mg, he will also bring me the labs on his next office visit.  He has been scheduled for nuclear stress test.  Once I have all the data we will make necessary changes to his medications and/or any further need for further evaluation.  Patient was appreciative of my call.

## 2021-08-31 NOTE — Telephone Encounter (Signed)
Called pt no answer, left a vm to call back

## 2021-09-17 ENCOUNTER — Other Ambulatory Visit: Payer: Managed Care, Other (non HMO)

## 2021-09-19 ENCOUNTER — Ambulatory Visit: Payer: Managed Care, Other (non HMO) | Admitting: Student

## 2021-10-01 ENCOUNTER — Other Ambulatory Visit: Payer: Self-pay

## 2021-10-01 ENCOUNTER — Ambulatory Visit: Payer: Managed Care, Other (non HMO)

## 2021-10-01 DIAGNOSIS — I739 Peripheral vascular disease, unspecified: Secondary | ICD-10-CM

## 2021-10-01 DIAGNOSIS — R0989 Other specified symptoms and signs involving the circulatory and respiratory systems: Secondary | ICD-10-CM

## 2021-10-01 DIAGNOSIS — E119 Type 2 diabetes mellitus without complications: Secondary | ICD-10-CM

## 2021-10-01 DIAGNOSIS — Z794 Long term (current) use of insulin: Secondary | ICD-10-CM

## 2021-10-02 LAB — PCV MYOCARDIAL PERFUSION WO LEXISCAN
Angina Index: 0
ST Depression (mm): 0 mm

## 2021-10-02 NOTE — Progress Notes (Signed)
I will discuss on office visit.  In view of low calculated LVEF and markedly decreased exercise capacity, I would consider the study to be at least intermediate risk.

## 2021-10-04 ENCOUNTER — Encounter: Payer: Self-pay | Admitting: Student

## 2021-10-04 ENCOUNTER — Other Ambulatory Visit: Payer: Self-pay

## 2021-10-04 ENCOUNTER — Ambulatory Visit: Payer: Managed Care, Other (non HMO) | Admitting: Student

## 2021-10-04 VITALS — BP 144/79 | HR 81 | Temp 98.3°F | Resp 17 | Ht 73.0 in | Wt 204.0 lb

## 2021-10-04 DIAGNOSIS — I10 Essential (primary) hypertension: Secondary | ICD-10-CM

## 2021-10-04 DIAGNOSIS — R931 Abnormal findings on diagnostic imaging of heart and coronary circulation: Secondary | ICD-10-CM

## 2021-10-04 DIAGNOSIS — I6523 Occlusion and stenosis of bilateral carotid arteries: Secondary | ICD-10-CM

## 2021-10-04 DIAGNOSIS — I739 Peripheral vascular disease, unspecified: Secondary | ICD-10-CM

## 2021-10-04 MED ORDER — CLOPIDOGREL BISULFATE 75 MG PO TABS: 75.0000 mg | ORAL_TABLET | Freq: Every day | ORAL | 3 refills | Status: DC

## 2021-10-04 MED ORDER — METOPROLOL SUCCINATE ER 25 MG PO TB24: 12.5000 mg | ORAL_TABLET | Freq: Every day | ORAL | 3 refills | Status: DC

## 2021-10-04 NOTE — Progress Notes (Signed)
Primary Physician/Referring:  Glenda Chroman, MD  Patient ID: Shawn Daniels, male    DOB: 08-13-1956, 65 y.o.   MRN: [id number]  Chief Complaint  Patient presents with   Follow-up   Results   HPI:    Shawn Daniels  is a 65 y.o. caucasian male with a past medical history significant for type 2 diabetes mellitus, hypertension, hyperlipidemia, GERD, and a roughly 40 pack year smoking history. He presents today by referral from Dr. Woody Seller for evaluation. 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   Patient presents for follow-up of cardiac testing.  He underwent carotid artery duplex which revealed minimal bilateral carotid artery stenosis.  Coronary calcium noted patient in the 97th percentile, but subsequently underwent stress test which was overall low risk.  ABI indicated moderate a reduced perfusion bilaterally.  Patient is feeling relatively well.  His primary concern is continued leg muscle cramping/tightening both during the day with activity as well as at night.  Denies chest pain, syncope, SOB, palpitations, or difficulty breathing.  Past Medical History:  Diagnosis Date   Diabetes mellitus without complication (HCC)    GERD (gastroesophageal reflux disease)    Hypertension    Past Surgical History:  Procedure Laterality Date   KNEE SURGERY     PENILE PROSTHESIS IMPLANT     WRIST SURGERY     Family History  Problem Relation Age of Onset   Heart failure Father     Social History   Tobacco Use   Smoking status: Former    Packs/day: 1.50    Years: 30.00    Pack years: 45.00    Types: Cigarettes, Cigars    Quit date: 2009    Years since quitting: 13.7   Smokeless tobacco: Never  Substance Use Topics   Alcohol use: Yes    Alcohol/week: 3.0 standard drinks    Types: 3 Cans of beer per week    Comment: daily   Marital Status: Married  ROS  Review of Systems   Cardiovascular:  Positive for claudication. Negative for chest pain, dyspnea on exertion, irregular heartbeat, leg swelling, near-syncope, orthopnea, palpitations and syncope.  Respiratory:  Negative for cough, shortness of breath and sleep disturbances due to breathing.   Gastrointestinal:  Negative for hematochezia and melena.  Genitourinary:  Negative for hematuria.  Objective  Blood pressure (!) 144/79, pulse 81, temperature 98.3 F (36.8 C), temperature source Temporal, resp. rate 17, height _0  (1.854 m), weight 204 lb (92.5 kg), SpO2 95 %. Body mass index is 26.91 kg/m.  Vitals with BMI 10/04/2021 10/04/2021 08/01/2021  Height - _1  _2   Weight - 204 lbs 205 lbs  BMI - 49.67 59.16  Systolic 384 665 993  Diastolic 79 80 70  Pulse 81 71 92    Physical Exam Constitutional:      General: He is not in acute distress.    Appearance: He is normal weight.  Neck:     Vascular: Carotid bruit (bilateral) present.  Cardiovascular:     Rate and Rhythm: Normal rate. Rhythm irregular.     Pulses:          Carotid pulses are 2+ on the right side and 2+ on the left side.      Radial pulses are 2+ on the right side and 2+ on the left side.       Femoral pulses are 2+ on the right side  and 2+ on the left side.      Popliteal pulses are 2+ on the right side and 2+ on the left side.       Dorsalis pedis pulses are 1+ on the right side and 1+ on the left side.       Posterior tibial pulses are 2+ on the right side and 2+ on the left side.     Heart sounds: Normal heart sounds. No murmur heard.   No gallop.     Comments: Frequent PACs heard on auscultation  Pulmonary:     Effort: Pulmonary effort is normal. No respiratory distress.     Breath sounds: Normal breath sounds. No wheezing, rhonchi or rales.  Musculoskeletal:     Right lower leg: No edema.     Left lower leg: No edema.  Skin:    Comments: Bilateral lower leg moderate varicosities   Neurological:     Mental Status: He is  alert.     Laboratory examination:   No results for input(s): NA, K, CL, CO2, GLUCOSE, BUN, CREATININE, CALCIUM, GFRNONAA, GFRAA in the last 8760 hours. CrCl cannot be calculated (Patient's most recent lab result is older than the maximum 21 days allowed.).  CMP Latest Ref Rng & Units 06/01/2017  Glucose 65 - 99 mg/dL 249(H)  BUN 6 - 20 mg/dL 15  Creatinine 0.61 - 1.24 mg/dL 0.96  Sodium 135 - 145 mmol/L 135  Potassium 3.5 - 5.1 mmol/L 4.3  Chloride 101 - 111 mmol/L 99(L)  CO2 22 - 32 mmol/L 26  Calcium 8.9 - 10.3 mg/dL 9.6   CBC Latest Ref Rng & Units 06/01/2017 08/31/2009  WBC 4.0 - 10.5 K/uL 4.1 -  Hemoglobin 13.0 - 17.0 g/dL 15.1 14.2  Hematocrit 39.0 - 52.0 % 42.7 -  Platelets 150 - 400 K/uL 146(L) -    Lipid Panel No results for input(s): CHOL, TRIG, LDLCALC, VLDL, HDL, CHOLHDL, LDLDIRECT in the last 8760 hours. Lipid Panel  No results found for: CHOL, TRIG, HDL, CHOLHDL, VLDL, LDLCALC, LDLDIRECT, LABVLDL   HEMOGLOBIN A1C No results found for: HGBA1C, MPG TSH No results for input(s): TSH in the last 8760 hours.  External labs:  Labs 07/03/2021:  Hb 14.4/HCT 42.2, platelets 145, normal indicis.  TSH normal at 1.490. A1c 6.2%.  Total cholesterol 222, triglycerides 157, HDL 61, LDL 133.  Serum glucose 61 mg, BUN 14, creatinine 0.75, EGFR 101 mL, potassium 4.4.  Allergies   Allergies  Allergen Reactions   Codeine    Keflex [Cephalexin]    Penicillins      Medication prior to this encounter:   Outpatient Medications Prior to Visit  Medication Sig Dispense Refill   ACCU-CHEK GUIDE test strip USE 1 TEST STRIP TWICE DAILY     aspirin 81 MG chewable tablet Chew by mouth daily.     cetirizine (ZYRTEC) 10 MG tablet Take 10 mg by mouth as needed.  2   GLYXAMBI 25-5 MG TABS Take 1 tablet by mouth daily.     lisinopril (PRINIVIL,ZESTRIL) 10 MG tablet Take 10 mg by mouth in the morning and at bedtime.     Magnesium 300 MG CAPS Take 1 capsule by mouth daily.      metFORMIN (GLUCOPHAGE) 500 MG tablet Take 500 mg by mouth 2 (two) times daily with a meal.     NOVOLIN 70/30 RELION (70-30) 100 UNIT/ML injection SMARTSIG:30 Unit(s) SUB-Q Twice Daily     omeprazole (PRILOSEC) 20 MG capsule Take 20 mg by mouth  daily.     POTASSIUM CHLORIDE PO Take 1 tablet by mouth daily.     RELION INSULIN SYRINGE 31G X 15/64" 0.5 ML MISC USE 1 THREE TIMES DAILY     rosuvastatin (CRESTOR) 10 MG tablet      diclofenac (VOLTAREN) 75 MG EC tablet Take 1 tablet (75 mg total) by mouth 2 (two) times daily. 60 tablet 1   nitroGLYCERIN (NITRODUR - DOSED IN MG/24 HR) 0.2 mg/hr patch Apply 1/4th patch to affected elbow, change daily 30 patch 1   No facility-administered medications prior to visit.     Medication list after today's encounter   Current Outpatient Medications  Medication Instructions   ACCU-CHEK GUIDE test strip USE 1 TEST STRIP TWICE DAILY   aspirin 81 MG chewable tablet Oral, Daily   cetirizine (ZYRTEC) 10 mg, Oral, As needed   clopidogrel (PLAVIX) 75 mg, Oral, Daily   GLYXAMBI 25-5 MG TABS 1 tablet, Oral, Daily   lisinopril (ZESTRIL) 10 mg, Oral, 2 times daily   Magnesium 300 MG CAPS 1 capsule, Oral, Daily   metFORMIN (GLUCOPHAGE) 500 mg, Oral, 2 times daily with meals   metoprolol succinate (TOPROL-XL) 12.5 mg, Oral, Daily, Take with or immediately following a meal.   NOVOLIN 70/30 RELION (70-30) 100 UNIT/ML injection SMARTSIG:30 Unit(s) SUB-Q Twice Daily   omeprazole (PRILOSEC) 20 mg, Oral, Daily   POTASSIUM CHLORIDE PO 1 tablet, Oral, Daily   RELION INSULIN SYRINGE 31G X 15/64" 0.5 ML MISC USE 1 THREE TIMES DAILY   rosuvastatin (CRESTOR) 10 MG tablet No dose, route, or frequency recorded.    Radiology:   No results found.  Cardiac Studies:   PCV MYOCARDIAL PERFUSION WO LEXISCAN 10/01/2021 Exercise nuclear stress test was performed using Bruce protocol. Patient reached 5.8 METS, and 87% of age predicted maximum heart rate. Exercise capacity was low.  No chest pain reported. Heart rate and hemodynamic response were normal. Stress EKG revealed no ischemic changes. Mildly decreased tracer uptake in inferior myocardium likely due to diaphragmatic attenuation. Normal wall motion and thickening. Stress LVEF calculated 44%, but visually appears 50-55%. Low risk study.  ABI 08/16/2021:  This exam reveals moderately decreased perfusion of the right lower extremity, noted at the post tibial artery level (ABI 0.76) and moderately decreased perfusion of the left lower extremity, noted at the anterior tibial artery level (ABI 0.69).  There is dampened waveform at the level of the ankle bilaterally.  If clinically indicated, consider complete lower extremity arterial duplex evaluation.   Carotid artery duplex 08/16/2021:  Duplex suggests minimal stenosis in the right common carotid artery.    There is mild mixed plaque.  Duplex suggests stenosis in the right  external carotid artery (<50%).  Duplex suggests stenosis in the left common carotid artery (<50%) with  moderate mixed plaque.  Antegrade right vertebral artery flow. Antegrade left vertebral artery  flow.  Follow up in one year is appropriate if clinically indicated.  Coronary calcium score 08/16/2021: Left Main: 120 LAD: 967 LCx: 41 RCA: 394 Total Agatston Score: 1523 MESA database percentile: 97 Aortic atherosclerosis.  No acute extra cardiac abnormality.  EKG:   EKG 08/01/2021: Normal sinus rhythm at rate of 81 bpm, normal axis, incomplete right bundle branch block.  Single PAC otherwise normal EKG.  Assessment     ICD-10-CM   1. Claudication in peripheral vascular disease (HCC)  I73.9 PCV LOWER ARTERIAL (BILATERAL)    2. 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 Primary hypertension  I10     4. Bilateral carotid artery stenosis  I65.23        Medications Discontinued During This Encounter  Medication Reason   diclofenac (VOLTAREN) 75 MG EC  tablet Error   nitroGLYCERIN (NITRODUR - DOSED IN MG/24 HR) 0.2 mg/hr patch Error    Meds ordered this encounter  Medications   metoprolol succinate (TOPROL-XL) 25 MG 24 hr tablet    Sig: Take 0.5 tablets (12.5 mg total) by mouth daily. Take with or immediately following a meal.    Dispense:  45 tablet    Refill:  3   clopidogrel (PLAVIX) 75 MG tablet    Sig: Take 1 tablet (75 mg total) by mouth daily.    Dispense:  90 tablet    Refill:  3    No orders of the defined types were placed in this encounter.  Recommendations:   Shawn Daniels is a 65 y.o. caucasian male with a past medical history significant for type 2 diabetes mellitus, hypertension, hyperlipidemia, GERD, and 40 pack year smoking history. He presents today by referral from Dr. Woody Seller for evaluation. 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   Follow-up of cardiac testing.  Coronary calcium score revealed total score of 1523 placing patient in the 97th percentile.  He subsequently underwent nuclear stress test which was overall low risk.  Reviewed and discussed these results with patient, answered his questions.  Patient also has mild bilateral carotid artery stenosis, will repeat surveillance scan in 1 year.  In regard to patient's leg pain/cramping, he does have moderately reduced perfusion bilaterally to his lower extremities.  He is presently taking aspirin 81 mg daily, will add Plavix 75 mg p.o. once daily.  We will also obtain complete lower extremity arterial duplex to further evaluate.  Regard to patient's elevated coronary calcium score he is presently on aspirin and statin therapy as well as taking lisinopril.  Given his coronary calcium and uncontrolled hypertension we will add metoprolol succinate 12.5 mg p.o. once daily.  Follow-up in 8 weeks, sooner if needed, for results of cardiac testing, hypertension, PAD   Alethia Berthold, PA-C 10/04/2021, 4:34 PM Office: [phone]

## 2021-10-16 ENCOUNTER — Other Ambulatory Visit: Payer: Self-pay

## 2021-10-16 ENCOUNTER — Ambulatory Visit: Payer: Managed Care, Other (non HMO)

## 2021-10-16 DIAGNOSIS — I739 Peripheral vascular disease, unspecified: Secondary | ICD-10-CM

## 2021-11-28 NOTE — Progress Notes (Signed)
Primary Physician/Referring:  Glenda Chroman, MD  Patient ID: Shawn Daniels, male    DOB: 26-Feb-1956, 65 y.o.   MRN: [id number]  Chief Complaint  Patient presents with   Hypertension   Hyperlipidemia   Follow-up   leg and hand cramps   HPI:    Shawn Daniels  is a 65 y.o. caucasian male with a past medical history significant for type 2 diabetes mellitus, mild bilateral carotid artery stenosis, hypertension, hyperlipidemia, GERD, and a roughly 40 pack year smoking history. He presents today by referral from Dr. Woody Seller for evaluation. 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   Patient underwent coronary calcium score revealed total score 1523 placing patient in the 97th percentile.  He subsequently underwent nuclear stress test which was overall low risk.  Given underlying coronary calcium at last office visit added Toprol-XL 12.5 mg p.o. daily to also improve hypertension control.  Last office visit patient complained of claudication, therefore added Plavix in combination with aspirin and ordered Lower extremity arterial duplex for further evaluation which revealed concern for diffuse disease of his right lower extremity.  Since last office patient patient was advised by PCP for statin holiday given nighttime leg pains and cramps.  Nighttime symptoms improved with discontinuation of statin, he has therefore been advised to take Crestor 10 mg every other day.  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  Symptoms have not changed with addition of Plavix.  Patient's blood pressure is elevated in the office, however he notes that he was stuck in traffic on his way here and his blood pressure at home earlier was 124/78 mmHg.  Denies chest pain, syncope, SOB, palpitations, or difficulty breathing.  Past Medical History:   Diagnosis Date   Diabetes mellitus without complication (HCC)    GERD (gastroesophageal reflux disease)    Hypertension    Past Surgical History:  Procedure Laterality Date   KNEE SURGERY     PENILE PROSTHESIS IMPLANT     WRIST SURGERY     Family History  Problem Relation Age of Onset   Heart failure Father     Social History   Tobacco Use   Smoking status: Former    Packs/day: 1.50    Years: 30.00    Pack years: 45.00    Types: Cigarettes, Cigars    Quit date: 2009    Years since quitting: 13.9   Smokeless tobacco: Never  Substance Use Topics   Alcohol use: Yes    Alcohol/week: 3.0 standard drinks    Types: 3 Cans of beer per week    Comment: daily   Marital Status: Married  ROS  Review of Systems  Cardiovascular:  Positive for claudication. Negative for chest pain, dyspnea on exertion, irregular heartbeat, leg swelling, near-syncope, orthopnea, palpitations and syncope.  Respiratory:  Negative for cough, shortness of breath and sleep disturbances due to breathing.   Gastrointestinal:  Negative for hematochezia and melena.  Genitourinary:  Negative for hematuria.  Objective  Blood pressure 140/83, pulse 87, height _0  (1.854 m), weight 205 lb (93 kg), SpO2 96 %. Body mass index is 27.05 kg/m.  Vitals with BMI 11/29/2021 10/04/2021 10/04/2021  Height _1  - _2   Weight 205 lbs - 204 lbs  BMI 40.76 - 80.88  Systolic 110 315 945  Diastolic 83 79 80  Pulse 87  81 71    Physical Exam Constitutional:      General: He is not in acute distress.    Appearance: He is normal weight.  Neck:     Vascular: Carotid bruit (bilateral) present.  Cardiovascular:     Rate and Rhythm: Normal rate. Rhythm irregular.     Pulses:          Carotid pulses are 2+ on the right side and 2+ on the left side.      Radial pulses are 2+ on the right side and 2+ on the left side.       Femoral pulses are 2+ on the right side and 2+ on the left side.      Popliteal pulses are 2+ on  the right side and 2+ on the left side.       Dorsalis pedis pulses are 1+ on the right side and 1+ on the left side.       Posterior tibial pulses are 2+ on the right side and 2+ on the left side.     Heart sounds: Normal heart sounds. No murmur heard.   No gallop.  Pulmonary:     Effort: Pulmonary effort is normal. No respiratory distress.     Breath sounds: Normal breath sounds. No wheezing, rhonchi or rales.  Musculoskeletal:     Right lower leg: No edema.     Left lower leg: No edema.  Skin:    Comments: Bilateral lower leg moderate varicosities   Neurological:     Mental Status: He is alert.     Laboratory examination:   No results for input(s): NA, K, CL, CO2, GLUCOSE, BUN, CREATININE, CALCIUM, GFRNONAA, GFRAA in the last 8760 hours. CrCl cannot be calculated (Patient's most recent lab result is older than the maximum 21 days allowed.).  CMP Latest Ref Rng & Units 06/01/2017  Glucose 65 - 99 mg/dL 249(H)  BUN 6 - 20 mg/dL 15  Creatinine 0.61 - 1.24 mg/dL 0.96  Sodium 135 - 145 mmol/L 135  Potassium 3.5 - 5.1 mmol/L 4.3  Chloride 101 - 111 mmol/L 99(L)  CO2 22 - 32 mmol/L 26  Calcium 8.9 - 10.3 mg/dL 9.6   CBC Latest Ref Rng & Units 06/01/2017 08/31/2009  WBC 4.0 - 10.5 K/uL 4.1 -  Hemoglobin 13.0 - 17.0 g/dL 15.1 14.2  Hematocrit 39.0 - 52.0 % 42.7 -  Platelets 150 - 400 K/uL 146(L) -    Lipid Panel No results for input(s): CHOL, TRIG, LDLCALC, VLDL, HDL, CHOLHDL, LDLDIRECT in the last 8760 hours. Lipid Panel  No results found for: CHOL, TRIG, HDL, CHOLHDL, VLDL, LDLCALC, LDLDIRECT, LABVLDL   HEMOGLOBIN A1C No results found for: HGBA1C, MPG TSH No results for input(s): TSH in the last 8760 hours.  External labs:  Labs 07/03/2021:  Hb 14.4/HCT 42.2, platelets 145, normal indicis.  TSH normal at 1.490. A1c 6.2%.  Total cholesterol 222, triglycerides 157, HDL 61, LDL 133.  Serum glucose 61 mg, BUN 14, creatinine 0.75, EGFR 101 mL, potassium 4.4  Allergies    Allergies  Allergen Reactions   Codeine    Keflex [Cephalexin]    Penicillins     Medication prior to this encounter:   Outpatient Medications Prior to Visit  Medication Sig Dispense Refill   ACCU-CHEK GUIDE test strip USE 1 TEST STRIP TWICE DAILY     aspirin 81 MG chewable tablet Chew by mouth daily.     cetirizine (ZYRTEC) 10 MG tablet Take 10 mg by  mouth as needed.  2   GLYXAMBI 25-5 MG TABS Take 1 tablet by mouth daily.     lisinopril (PRINIVIL,ZESTRIL) 10 MG tablet Take 10 mg by mouth in the morning and at bedtime.     Magnesium 300 MG CAPS Take 1 capsule by mouth daily.     metFORMIN (GLUCOPHAGE) 500 MG tablet Take 500 mg by mouth 2 (two) times daily with a meal.     metoprolol succinate (TOPROL-XL) 25 MG 24 hr tablet Take 0.5 tablets (12.5 mg total) by mouth daily. Take with or immediately following a meal. 45 tablet 3   NOVOLIN 70/30 RELION (70-30) 100 UNIT/ML injection SMARTSIG:30 Unit(s) SUB-Q Twice Daily     omeprazole (PRILOSEC) 20 MG capsule Take 20 mg by mouth daily.     POTASSIUM CHLORIDE PO Take 1 tablet by mouth daily.     RELION INSULIN SYRINGE 31G X 15/64" 0.5 ML MISC USE 1 THREE TIMES DAILY     clopidogrel (PLAVIX) 75 MG tablet Take 1 tablet (75 mg total) by mouth daily. 90 tablet 3   rosuvastatin (CRESTOR) 10 MG tablet  (Patient not taking: Reported on 11/29/2021)     No facility-administered medications prior to visit.    Medication list after today's encounter   Current Outpatient Medications  Medication Instructions   ACCU-CHEK GUIDE test strip USE 1 TEST STRIP TWICE DAILY   aspirin 81 MG chewable tablet Oral, Daily   cetirizine (ZYRTEC) 10 mg, Oral, As needed   cilostazol (PLETAL) 50 mg, Oral, 2 times daily   GLYXAMBI 25-5 MG TABS 1 tablet, Oral, Daily   lisinopril (ZESTRIL) 10 mg, Oral, 2 times daily   Magnesium 300 MG CAPS 1 capsule, Oral, Daily   metFORMIN (GLUCOPHAGE) 500 mg, Oral, 2 times daily with meals   metoprolol succinate (TOPROL-XL) 12.5  mg, Oral, Daily, Take with or immediately following a meal.   NOVOLIN 70/30 RELION (70-30) 100 UNIT/ML injection SMARTSIG:30 Unit(s) SUB-Q Twice Daily   omeprazole (PRILOSEC) 20 mg, Oral, Daily   POTASSIUM CHLORIDE PO 1 tablet, Oral, Daily   RELION INSULIN SYRINGE 31G X 15/64" 0.5 ML MISC USE 1 THREE TIMES DAILY   rosuvastatin (CRESTOR) 10 mg, Oral, Every other day   Radiology:   No results found.  Cardiac Studies:  Lower Extremity Arterial Duplex 10/16/2021:  Moderate velocity increase at the right distal superficial femoral artery  suggests >50% stenosis.  No hemodynamically significant stenosis are  identified in the left lower extremity arterial system.  There is severe diffuse calcific plaque throughout the bilateral SFA and  EIA and CFA.  This exam reveals mildly decreased perfusion of the right lower extremity,  noted at the post tibial artery level (ABI 0.89) and moderately decreased  perfusion of the left lower extremity, noted at the post tibial artery  level (ABI 0.78 with severely abnormal monophasic waveform pattern at the  bilateral ankles).  PCV MYOCARDIAL PERFUSION WO LEXISCAN 10/01/2021 Exercise nuclear stress test was performed using Bruce protocol. Patient reached 5.8 METS, and 87% of age predicted maximum heart rate. Exercise capacity was low. No chest pain reported. Heart rate and hemodynamic response were normal. Stress EKG revealed no ischemic changes. Mildly decreased tracer uptake in inferior myocardium likely due to diaphragmatic attenuation. Normal wall motion and thickening. Stress LVEF calculated 44%, but visually appears 50-55%. Low risk study.  ABI 08/16/2021:  This exam reveals moderately decreased perfusion of the right lower extremity, noted at the post tibial artery level (ABI 0.76) and moderately decreased  perfusion of the left lower extremity, noted at the anterior tibial artery level (ABI 0.69).  There is dampened waveform at the level of the ankle  bilaterally.  If clinically indicated, consider complete lower extremity arterial duplex evaluation.   Carotid artery duplex 08/16/2021:  Duplex suggests minimal stenosis in the right common carotid artery.    There is mild mixed plaque.  Duplex suggests stenosis in the right  external carotid artery (<50%).  Duplex suggests stenosis in the left common carotid artery (<50%) with  moderate mixed plaque.  Antegrade right vertebral artery flow. Antegrade left vertebral artery  flow.  Follow up in one year is appropriate if clinically indicated.  Coronary calcium score 08/16/2021: Left Main: 120 LAD: 967 LCx: 41 RCA: 394 Total Agatston Score: 1523 MESA database percentile: 97 Aortic atherosclerosis. No acute extra cardiac abnormality.  EKG:   08/01/2021: Normal sinus rhythm at rate of 81 bpm, normal axis, incomplete right bundle branch block.  Single PAC otherwise normal EKG.  Assessment     ICD-10-CM   1. Claudication in peripheral vascular disease (HCC)  I73.9     2. Hypercholesteremia  E78.00        Medications Discontinued During This Encounter  Medication Reason   clopidogrel (PLAVIX) 75 MG tablet Change in therapy   rosuvastatin (CRESTOR) 10 MG tablet     Meds ordered this encounter  Medications   cilostazol (PLETAL) 50 MG tablet    Sig: Take 1 tablet (50 mg total) by mouth 2 (two) times daily.    Dispense:  60 tablet    Refill:  3   rosuvastatin (CRESTOR) 10 MG tablet    Sig: Take 1 tablet (10 mg total) by mouth every other day.    Dispense:  30 tablet    Refill:  3    No orders of the defined types were placed in this encounter.  Recommendations:   Shawn Daniels is a 65 y.o. caucasian male with a past medical history significant for type 2 diabetes mellitus, hypertension, hyperlipidemia, GERD, and 40 pack year smoking history. He presents today by referral from Dr. Woody Seller for evaluation. 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   Patient underwent coronary calcium score revealed total score 1523 placing patient in the 97th percentile.  He subsequently underwent nuclear stress test which was overall low risk.  Given underlying coronary calcium at last office visit added Toprol-XL 12.5 mg p.o. daily to also improve hypertension control.  Last office visit patient complained of claudication, therefore added Plavix in combination with aspirin and ordered Lower extremity arterial duplex for further evaluation which revealed concern for disease in his right SFA.  Despite addition of Plavix patient continues to have symptoms of claudication.  Discussed results with Dr. Einar Gip who recommends switching patient from Plavix to Pletal 50 mg p.o. twice daily.  Symptoms of claudication would consider peripheral angiogram with potential intervention to patient's right SFA.  In regard to statin therapy, patient has had statin intolerance.  Encourage patient to take Crestor 10 mg every other day.  Patient's lipids remain uncontrolled.  Given history of statin intolerance and continued elevated lipids in the context of PAD we will screen patient for PREVAIL-ASCVD clinical trial (obicetrapib) for additional lipid management. Continue aspirin therapy.   Follow up in 4 weeks to reevaluate claudication.   Patient was seen in collaboration with Dr. Einar Gip and he is in agreement with the plan.  Alethia Berthold, PA-C 11/30/2021, 3:37 PM Office: [phone]

## 2021-11-29 ENCOUNTER — Other Ambulatory Visit: Payer: Self-pay

## 2021-11-29 ENCOUNTER — Ambulatory Visit: Payer: Managed Care, Other (non HMO) | Admitting: Student

## 2021-11-29 ENCOUNTER — Encounter: Payer: Self-pay | Admitting: Student

## 2021-11-29 VITALS — BP 140/83 | HR 87 | Ht 73.0 in | Wt 205.0 lb

## 2021-11-29 DIAGNOSIS — I739 Peripheral vascular disease, unspecified: Secondary | ICD-10-CM

## 2021-11-29 DIAGNOSIS — E7800 Pure hypercholesterolemia, unspecified: Secondary | ICD-10-CM

## 2021-11-29 MED ORDER — ROSUVASTATIN CALCIUM 10 MG PO TABS: 10.0000 mg | ORAL_TABLET | ORAL | 3 refills | Status: DC

## 2021-11-29 MED ORDER — CILOSTAZOL 50 MG PO TABS: 50.0000 mg | ORAL_TABLET | Freq: Two times a day (BID) | ORAL | 3 refills | Status: DC

## 2021-11-30 ENCOUNTER — Encounter: Payer: Self-pay | Admitting: Student

## 2021-12-25 ENCOUNTER — Ambulatory Visit: Payer: Managed Care, Other (non HMO) | Admitting: Student

## 2021-12-25 ENCOUNTER — Encounter: Payer: Self-pay | Admitting: Student

## 2021-12-25 ENCOUNTER — Other Ambulatory Visit: Payer: Self-pay

## 2021-12-25 VITALS — BP 154/82 | HR 84 | Temp 98.4°F | Resp 17 | Ht 73.0 in | Wt 205.0 lb

## 2021-12-25 DIAGNOSIS — E7800 Pure hypercholesterolemia, unspecified: Secondary | ICD-10-CM

## 2021-12-25 DIAGNOSIS — I10 Essential (primary) hypertension: Secondary | ICD-10-CM

## 2021-12-25 DIAGNOSIS — I739 Peripheral vascular disease, unspecified: Secondary | ICD-10-CM

## 2021-12-25 NOTE — Progress Notes (Signed)
Primary Physician/Referring:  Glenda Chroman, MD  Patient ID: Shawn Daniels, male    DOB: Feb 22, 1956, 66 y.o.   MRN: [id number]  Chief Complaint  Patient presents with   Claudication in peripheral vascular disease ([place])    4 WEEKS   HPI:    Shawn Daniels  is a 66 y.o. caucasian male with a past medical history significant for type 2 diabetes mellitus, mild bilateral carotid artery stenosis, hypertension, hyperlipidemia, GERD, and a roughly 40 pack year smoking history. He presents today by referral from Dr. Woody Seller for evaluation. 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   Patient underwent coronary calcium score revealed total score 1523 placing patient in the 97th percentile.  He subsequently underwent nuclear stress test which was overall low risk.  Given underlying coronary calcium at last office visit added Toprol-XL 12.5 mg p.o. daily to also improve hypertension control.  Patient complained of symptoms of claudication, therefore added Plavix in combination with aspirin, however symptoms did not improve.  At last office visit switched Plavix to Pletal 50 mg p.o. twice daily.  Patient now presents for 4-week follow-up.  Patient has not noticed improvement of claudication symptoms with cilostazol.   Blood pressure is elevated in the office today, however he monitors it at home regularly and reports home blood pressure readings which are well controlled.  Denies chest pain, syncope, SOB, palpitations, or difficulty breathing.  Past Medical History:  Diagnosis Date   Diabetes mellitus without complication (HCC)    GERD (gastroesophageal reflux disease)    Hypertension    Past Surgical History:  Procedure Laterality Date   KNEE SURGERY     PENILE PROSTHESIS IMPLANT     WRIST SURGERY     Family History  Problem Relation Age of Onset   Heart failure Father     Social History   Tobacco  Use   Smoking status: Former    Packs/day: 1.50    Years: 30.00    Pack years: 45.00    Types: Cigarettes, Cigars    Quit date: 2009    Years since quitting: 14.0   Smokeless tobacco: Never  Substance Use Topics   Alcohol use: Yes    Alcohol/week: 3.0 standard drinks    Types: 3 Cans of beer per week    Comment: daily   Marital Status: Married  ROS  Review of Systems  Cardiovascular:  Positive for claudication. Negative for chest pain, dyspnea on exertion, irregular heartbeat, leg swelling, near-syncope, orthopnea, palpitations and syncope.  Respiratory:  Negative for cough, shortness of breath and sleep disturbances due to breathing.   Gastrointestinal:  Negative for hematochezia and melena.  Genitourinary:  Negative for hematuria.  Objective  Blood pressure (!) 154/82, pulse 84, temperature 98.4 F (36.9 C), temperature source Temporal, resp. rate 17, height '6\' 1"'  (1.854 m), weight 205 lb (93 kg), SpO2 93 %. Body mass index is 27.05 kg/m.  Vitals with BMI 12/25/2021 11/29/2021 10/04/2021  Height '6\' 1"'  '6\' 1"'  -  Weight 205 lbs 205 lbs -  BMI 08.81 10.31 -  Systolic 594 585 929  Diastolic 82 83 79  Pulse 84 87 81    Physical Exam Constitutional:      General: He is not in acute distress.    Appearance: He is normal weight.  Neck:     Vascular: Carotid bruit (bilateral) present.  Cardiovascular:     Rate and Rhythm: Normal  rate. Rhythm irregular.     Pulses:          Carotid pulses are 2+ on the right side and 2+ on the left side.      Radial pulses are 2+ on the right side and 2+ on the left side.       Femoral pulses are 2+ on the right side and 2+ on the left side.      Popliteal pulses are 2+ on the right side and 2+ on the left side.       Dorsalis pedis pulses are 1+ on the right side and 1+ on the left side.       Posterior tibial pulses are 2+ on the right side and 2+ on the left side.     Heart sounds: Normal heart sounds. No murmur heard.   No gallop.   Pulmonary:     Effort: Pulmonary effort is normal. No respiratory distress.     Breath sounds: Normal breath sounds. No wheezing, rhonchi or rales.  Musculoskeletal:     Right lower leg: No edema.     Left lower leg: No edema.  Skin:    Comments: Bilateral lower leg moderate varicosities   Neurological:     Mental Status: He is alert.  Physical exam unchanged compared to previous office visit.  Laboratory examination:   No results for input(s): NA, K, CL, CO2, GLUCOSE, BUN, CREATININE, CALCIUM, GFRNONAA, GFRAA in the last 8760 hours. CrCl cannot be calculated (Patient's most recent lab result is older than the maximum 21 days allowed.).  CMP Latest Ref Rng & Units 06/01/2017  Glucose 65 - 99 mg/dL 249(H)  BUN 6 - 20 mg/dL 15  Creatinine 0.61 - 1.24 mg/dL 0.96  Sodium 135 - 145 mmol/L 135  Potassium 3.5 - 5.1 mmol/L 4.3  Chloride 101 - 111 mmol/L 99(L)  CO2 22 - 32 mmol/L 26  Calcium 8.9 - 10.3 mg/dL 9.6   CBC Latest Ref Rng & Units 06/01/2017 08/31/2009  WBC 4.0 - 10.5 K/uL 4.1 -  Hemoglobin 13.0 - 17.0 g/dL 15.1 14.2  Hematocrit 39.0 - 52.0 % 42.7 -  Platelets 150 - 400 K/uL 146(L) -    Lipid Panel No results for input(s): CHOL, TRIG, LDLCALC, VLDL, HDL, CHOLHDL, LDLDIRECT in the last 8760 hours. Lipid Panel  No results found for: CHOL, TRIG, HDL, CHOLHDL, VLDL, LDLCALC, LDLDIRECT, LABVLDL   HEMOGLOBIN A1C No results found for: HGBA1C, MPG TSH No results for input(s): TSH in the last 8760 hours.  External labs:  Labs 07/03/2021:  Hb 14.4/HCT 42.2, platelets 145, normal indicis.  TSH normal at 1.490. A1c 6.2%.  Total cholesterol 222, triglycerides 157, HDL 61, LDL 133.  Serum glucose 61 mg, BUN 14, creatinine 0.75, EGFR 101 mL, potassium 4.4  Allergies   Allergies  Allergen Reactions   Codeine    Keflex [Cephalexin]    Penicillins     Medication prior to this encounter:   Outpatient Medications Prior to Visit  Medication Sig Dispense Refill   ACCU-CHEK  GUIDE test strip USE 1 TEST STRIP TWICE DAILY     aspirin 81 MG chewable tablet Chew by mouth daily.     cetirizine (ZYRTEC) 10 MG tablet Take 10 mg by mouth as needed.  2   cilostazol (PLETAL) 50 MG tablet Take 1 tablet (50 mg total) by mouth 2 (two) times daily. 60 tablet 3   GLYXAMBI 25-5 MG TABS Take 1 tablet by mouth daily.  lisinopril (PRINIVIL,ZESTRIL) 10 MG tablet Take 10 mg by mouth in the morning and at bedtime.     Magnesium 300 MG CAPS Take 1 capsule by mouth daily.     metoprolol succinate (TOPROL-XL) 25 MG 24 hr tablet Take 0.5 tablets (12.5 mg total) by mouth daily. Take with or immediately following a meal. 45 tablet 3   NOVOLIN 70/30 RELION (70-30) 100 UNIT/ML injection SLIDING     omeprazole (PRILOSEC) 20 MG capsule Take 20 mg by mouth daily.     POTASSIUM CHLORIDE PO Take 1 tablet by mouth daily.     RELION INSULIN SYRINGE 31G X 15/64" 0.5 ML MISC USE 1 THREE TIMES DAILY     rosuvastatin (CRESTOR) 10 MG tablet Take 1 tablet (10 mg total) by mouth every other day. (Patient taking differently: Take 10 mg by mouth 2 (two) times a week.) 30 tablet 3   metFORMIN (GLUCOPHAGE) 500 MG tablet Take 500 mg by mouth 2 (two) times daily with a meal.     No facility-administered medications prior to visit.    Medication list after today's encounter   Current Outpatient Medications  Medication Instructions   ACCU-CHEK GUIDE test strip USE 1 TEST STRIP TWICE DAILY   aspirin 81 MG chewable tablet Oral, Daily   cetirizine (ZYRTEC) 10 mg, Oral, As needed   cilostazol (PLETAL) 50 mg, Oral, 2 times daily   GLYXAMBI 25-5 MG TABS 1 tablet, Oral, Daily   lisinopril (ZESTRIL) 10 mg, Oral, 2 times daily   Magnesium 300 MG CAPS 1 capsule, Oral, Daily   metoprolol succinate (TOPROL-XL) 12.5 mg, Oral, Daily, Take with or immediately following a meal.   NOVOLIN 70/30 RELION (70-30) 100 UNIT/ML injection SLIDING   omeprazole (PRILOSEC) 20 mg, Oral, Daily   POTASSIUM CHLORIDE PO 1 tablet, Oral,  Daily   RELION INSULIN SYRINGE 31G X 15/64" 0.5 ML MISC USE 1 THREE TIMES DAILY   rosuvastatin (CRESTOR) 10 mg, Oral, Every other day   Radiology:   No results found.  Cardiac Studies:  Lower Extremity Arterial Duplex 10/16/2021:  Moderate velocity increase at the right distal superficial femoral artery  suggests >50% stenosis.  No hemodynamically significant stenosis are  identified in the left lower extremity arterial system.  There is severe diffuse calcific plaque throughout the bilateral SFA and  EIA and CFA.  This exam reveals mildly decreased perfusion of the right lower extremity,  noted at the post tibial artery level (ABI 0.89) and moderately decreased  perfusion of the left lower extremity, noted at the post tibial artery  level (ABI 0.78 with severely abnormal monophasic waveform pattern at the  bilateral ankles).  PCV MYOCARDIAL PERFUSION WO LEXISCAN 10/01/2021 Exercise nuclear stress test was performed using Bruce protocol. Patient reached 5.8 METS, and 87% of age predicted maximum heart rate. Exercise capacity was low. No chest pain reported. Heart rate and hemodynamic response were normal. Stress EKG revealed no ischemic changes. Mildly decreased tracer uptake in inferior myocardium likely due to diaphragmatic attenuation. Normal wall motion and thickening. Stress LVEF calculated 44%, but visually appears 50-55%. Low risk study.  ABI 08/16/2021:  This exam reveals moderately decreased perfusion of the right lower extremity, noted at the post tibial artery level (ABI 0.76) and moderately decreased perfusion of the left lower extremity, noted at the anterior tibial artery level (ABI 0.69).  There is dampened waveform at the level of the ankle bilaterally.  If clinically indicated, consider complete lower extremity arterial duplex evaluation.   Carotid artery  duplex 08/16/2021:  Duplex suggests minimal stenosis in the right common carotid artery.    There is mild mixed  plaque.  Duplex suggests stenosis in the right  external carotid artery (<50%).  Duplex suggests stenosis in the left common carotid artery (<50%) with  moderate mixed plaque.  Antegrade right vertebral artery flow. Antegrade left vertebral artery  flow.  Follow up in one year is appropriate if clinically indicated.  Coronary calcium score 08/16/2021: Left Main: 120 LAD: 967 LCx: 41 RCA: 394 Total Agatston Score: 1523 MESA database percentile: 97 Aortic atherosclerosis. No acute extra cardiac abnormality.  EKG:   08/01/2021: Normal sinus rhythm at rate of 81 bpm, normal axis, incomplete right bundle branch block.  Single PAC otherwise normal EKG.  Assessment     ICD-10-CM   1. Claudication in peripheral vascular disease (HCC)  Q65.7 Basic metabolic panel    CBC    2. Hypercholesteremia  E78.00     3. Primary hypertension  I10        Medications Discontinued During This Encounter  Medication Reason   metFORMIN (GLUCOPHAGE) 500 MG tablet     No orders of the defined types were placed in this encounter.   Orders Placed This Encounter  Procedures   Basic metabolic panel   CBC    Recommendations:   Shawn Daniels is a 66 y.o. caucasian male with a past medical history significant for type 2 diabetes mellitus, hypertension, hyperlipidemia, GERD, and 40 pack year smoking history. He presents today by referral from Dr. Woody Seller for evaluation. 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   Patient underwent coronary calcium score revealed total score 1523 placing patient in the 97th percentile.  He subsequently underwent nuclear stress test which was overall low risk.  Given underlying coronary calcium at last office visit added Toprol-XL 12.5 mg p.o. daily to also improve hypertension control.  Patient complained of symptoms of claudication, therefore added Plavix in combination with aspirin,  however symptoms did not improve.  At last office visit switched Plavix to Pletal 50 mg p.o. twice daily.  Patient now presents for 4-week follow-up.  Patient's claudication symptoms are unchanged with switching from Plavix to Pletal.  Therefore recommend peripheral angiogram with potential intervention to patient's right SFA as discussed with Dr. Einar Gip at last office visit.  Discussed at length with patient regarding indications, risks, benefits of peripheral angiogram, patient verbalized understanding and wishes to proceed with potential intervention to his right SFA.  Patient's questions were addressed to his satisfaction.  In regard to hypertension, patient's home blood pressure is well controlled, therefore will not make changes at today's visit.  However advised patient to continue to monitor his blood pressure at home and notify usIf it remains >130/80 mmHg.  In regard to statin therapy, patient has had statin intolerance.  Encourage patient to take Crestor 10 mg every other day.  Patient's lipids remain uncontrolled.  Given history of statin intolerance and continued elevated lipids in the context of PAD we will screen patient for PREVAIL-ASCVD clinical trial (obicetrapib) for additional lipid management. Continue aspirin therapy.   Follow up after peripheral angiogram.   Alethia Berthold, PA-C 12/25/2021, 4:20 PM Office: [phone]

## 2021-12-27 LAB — BASIC METABOLIC PANEL
BUN/Creatinine Ratio: 21 (ref 10–24)
BUN: 17 mg/dL (ref 8–27)
CO2: 24 mmol/L (ref 20–29)
Calcium: 9.7 mg/dL (ref 8.6–10.2)
Chloride: 99 mmol/L (ref 96–106)
Creatinine, Ser: 0.82 mg/dL (ref 0.76–1.27)
Glucose: 51 mg/dL — ABNORMAL LOW (ref 70–99)
Potassium: 4 mmol/L (ref 3.5–5.2)
Sodium: 139 mmol/L (ref 134–144)
eGFR: 98 mL/min/{1.73_m2} (ref >59)

## 2021-12-27 LAB — CBC
Hematocrit: 38.1 % (ref 37.5–51.0)
Hemoglobin: 13.3 g/dL (ref 13.0–17.7)
MCH: 33.2 pg — ABNORMAL HIGH (ref 26.6–33.0)
MCHC: 34.9 g/dL (ref 31.5–35.7)
MCV: 95 fL (ref 79–97)
Platelets: 154 10*3/uL (ref 150–450)
RBC: 4.01 x10E6/uL — ABNORMAL LOW (ref 4.14–5.80)
RDW: 13.4 % (ref 11.6–15.4)
WBC: 6.1 10*3/uL (ref 3.4–10.8)

## 2021-12-31 DIAGNOSIS — I739 Peripheral vascular disease, unspecified: Secondary | ICD-10-CM | POA: Diagnosis present

## 2022-01-01 ENCOUNTER — Other Ambulatory Visit: Payer: Self-pay

## 2022-01-01 ENCOUNTER — Encounter (HOSPITAL_COMMUNITY)
Admission: RE | Disposition: A | Discharge status: Home / Self Care | Payer: Self-pay | Source: Home / Self Care | Attending: Cardiology

## 2022-01-01 ENCOUNTER — Ambulatory Visit (HOSPITAL_COMMUNITY)
Admission: RE | Admit: 2022-01-01 | Discharge: 2022-01-01 | Disposition: A | Discharge status: Home / Self Care | Payer: Managed Care, Other (non HMO) | Attending: Cardiology | Admitting: Cardiology

## 2022-01-01 ENCOUNTER — Encounter (HOSPITAL_COMMUNITY): Payer: Self-pay | Admitting: Cardiology

## 2022-01-01 DIAGNOSIS — E785 Hyperlipidemia, unspecified: Secondary | ICD-10-CM | POA: Diagnosis not present

## 2022-01-01 DIAGNOSIS — Z87891 Personal history of nicotine dependence: Secondary | ICD-10-CM | POA: Diagnosis not present

## 2022-01-01 DIAGNOSIS — I10 Essential (primary) hypertension: Secondary | ICD-10-CM | POA: Insufficient documentation

## 2022-01-01 DIAGNOSIS — K219 Gastro-esophageal reflux disease without esophagitis: Secondary | ICD-10-CM | POA: Diagnosis not present

## 2022-01-01 DIAGNOSIS — E7800 Pure hypercholesterolemia, unspecified: Secondary | ICD-10-CM | POA: Diagnosis not present

## 2022-01-01 DIAGNOSIS — I6523 Occlusion and stenosis of bilateral carotid arteries: Secondary | ICD-10-CM | POA: Diagnosis not present

## 2022-01-01 DIAGNOSIS — I739 Peripheral vascular disease, unspecified: Secondary | ICD-10-CM | POA: Diagnosis present

## 2022-01-01 DIAGNOSIS — E1151 Type 2 diabetes mellitus with diabetic peripheral angiopathy without gangrene: Secondary | ICD-10-CM | POA: Diagnosis not present

## 2022-01-01 DIAGNOSIS — I70213 Atherosclerosis of native arteries of extremities with intermittent claudication, bilateral legs: Secondary | ICD-10-CM | POA: Insufficient documentation

## 2022-01-01 HISTORY — PX: LOWER EXTREMITY ANGIOGRAPHY: CATH118251

## 2022-01-01 LAB — POCT I-STAT, CHEM 8
BUN: 22 mg/dL (ref 8–23)
Calcium, Ion: 1.28 mmol/L (ref 1.15–1.40)
Chloride: 99 mmol/L (ref 98–111)
Creatinine, Ser: 0.8 mg/dL (ref 0.61–1.24)
Glucose, Bld: 204 mg/dL — ABNORMAL HIGH (ref 70–99)
HCT: 43 % (ref 39.0–52.0)
Hemoglobin: 14.6 g/dL (ref 13.0–17.0)
Potassium: 4.8 mmol/L (ref 3.5–5.1)
Sodium: 136 mmol/L (ref 135–145)
TCO2: 27 mmol/L (ref 22–32)

## 2022-01-01 LAB — GLUCOSE, CAPILLARY
Glucose-Capillary: 186 mg/dL — ABNORMAL HIGH (ref 70–99)
Glucose-Capillary: 228 mg/dL — ABNORMAL HIGH (ref 70–99)

## 2022-01-01 SURGERY — LOWER EXTREMITY ANGIOGRAPHY
Anesthesia: LOCAL

## 2022-01-01 MED ORDER — NITROGLYCERIN 1 MG/10 ML FOR IR/CATH LAB
INTRA_ARTERIAL | Status: AC
  Filled 2022-01-01: qty 10

## 2022-01-01 MED ORDER — MIDAZOLAM HCL 2 MG/2ML IJ SOLN
INTRAMUSCULAR | Status: AC
  Filled 2022-01-01: qty 2

## 2022-01-01 MED ORDER — NITROGLYCERIN 1 MG/10 ML FOR IR/CATH LAB
INTRA_ARTERIAL | Status: DC | PRN
  Administered 2022-01-01 (×2): 200 ug

## 2022-01-01 MED ORDER — SODIUM CHLORIDE 0.9 % IV SOLN: 250.0000 mL | INTRAVENOUS | Status: DC | PRN

## 2022-01-01 MED ORDER — IODIXANOL 320 MG/ML IV SOLN
INTRAVENOUS | Status: DC | PRN
  Administered 2022-01-01: 255 mL via INTRA_ARTERIAL

## 2022-01-01 MED ORDER — MIDAZOLAM HCL 2 MG/2ML IJ SOLN
INTRAMUSCULAR | Status: DC | PRN
  Administered 2022-01-01 (×2): 1 mg via INTRAVENOUS
  Administered 2022-01-01: 2 mg via INTRAVENOUS

## 2022-01-01 MED ORDER — SODIUM CHLORIDE 0.9 % IV SOLN: INTRAVENOUS | Status: DC

## 2022-01-01 MED ORDER — HYDRALAZINE HCL 20 MG/ML IJ SOLN: 10.0000 mg | INTRAMUSCULAR | Status: DC | PRN

## 2022-01-01 MED ORDER — SODIUM CHLORIDE 0.9% FLUSH: 3.0000 mL | Freq: Two times a day (BID) | INTRAVENOUS | Status: DC

## 2022-01-01 MED ORDER — ONDANSETRON HCL 4 MG/2ML IJ SOLN: 4.0000 mg | Freq: Four times a day (QID) | INTRAMUSCULAR | Status: DC | PRN

## 2022-01-01 MED ORDER — HEPARIN (PORCINE) IN NACL 1000-0.9 UT/500ML-% IV SOLN
INTRAVENOUS | Status: DC | PRN
  Administered 2022-01-01 (×2): 500 mL

## 2022-01-01 MED ORDER — SODIUM CHLORIDE 0.9% FLUSH: 3.0000 mL | INTRAVENOUS | Status: DC | PRN

## 2022-01-01 MED ORDER — FENTANYL CITRATE (PF) 100 MCG/2ML IJ SOLN
INTRAMUSCULAR | Status: AC
  Filled 2022-01-01: qty 2

## 2022-01-01 MED ORDER — LIDOCAINE HCL (PF) 1 % IJ SOLN
INTRAMUSCULAR | Status: DC | PRN
  Administered 2022-01-01: 30 mL

## 2022-01-01 MED ORDER — FENTANYL CITRATE (PF) 100 MCG/2ML IJ SOLN
INTRAMUSCULAR | Status: DC | PRN
  Administered 2022-01-01: 50 ug via INTRAVENOUS
  Administered 2022-01-01: 25 ug via INTRAVENOUS
  Administered 2022-01-01: 50 ug via INTRAVENOUS

## 2022-01-01 MED ORDER — HEPARIN (PORCINE) IN NACL 1000-0.9 UT/500ML-% IV SOLN
INTRAVENOUS | Status: AC
  Filled 2022-01-01: qty 1000

## 2022-01-01 MED ORDER — ACETAMINOPHEN 325 MG PO TABS: 650.0000 mg | ORAL_TABLET | ORAL | Status: DC | PRN

## 2022-01-01 MED ORDER — LABETALOL HCL 5 MG/ML IV SOLN: 10.0000 mg | INTRAVENOUS | Status: DC | PRN

## 2022-01-01 MED ORDER — LIDOCAINE HCL (PF) 1 % IJ SOLN
INTRAMUSCULAR | Status: AC
  Filled 2022-01-01: qty 30

## 2022-01-01 SURGICAL SUPPLY — 12 items
CATH ANGIO 5F PIGTAIL 65CM (CATHETERS) ×1 IMPLANT
CATH CROSS OVER TEMPO 5F (CATHETERS) ×1 IMPLANT
CATH STRAIGHT 5FR 65CM (CATHETERS) ×1 IMPLANT
CLOSURE MYNX CONTROL 5F (Vascular Products) ×1 IMPLANT
KIT MICROPUNCTURE NIT STIFF (SHEATH) ×1 IMPLANT
KIT PV (KITS) ×3 IMPLANT
SHEATH PINNACLE 5F 10CM (SHEATH) ×1 IMPLANT
SHEATH PINNACLE ST 6F 45CM (SHEATH) ×1 IMPLANT
SYR MEDRAD MARK V 150ML (SYRINGE) ×1 IMPLANT
TRANSDUCER W/STOPCOCK (MISCELLANEOUS) ×3 IMPLANT
TRAY PV CATH (CUSTOM PROCEDURE TRAY) ×3 IMPLANT
WIRE HITORQ VERSACORE ST 145CM (WIRE) ×1 IMPLANT

## 2022-01-01 NOTE — H&P (Signed)
OV 12/25/2020 copied for documentation    Primary Physician/Referring:  Glenda Chroman, MD  Patient ID: Shawn Daniels, male    DOB: 1956-11-22, 66 y.o.   MRN: [id number]  Chief Complaint  Patient presents with   Claudication in peripheral vascular disease ([place])    4 WEEKS   HPI:    Shawn Daniels  is a 66 y.o. caucasian male with a past medical history significant for type 2 diabetes mellitus, mild bilateral carotid artery stenosis, hypertension, hyperlipidemia, GERD, and a roughly 40 pack year smoking history. He presents today by referral from Dr. Woody Seller for evaluation. 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   Patient underwent coronary calcium score revealed total score 1523 placing patient in the 97th percentile.  He subsequently underwent nuclear stress test which was overall low risk.  Given underlying coronary calcium at last office visit added Toprol-XL 12.5 mg p.o. daily to also improve hypertension control.  Patient complained of symptoms of claudication, therefore added Plavix in combination with aspirin, however symptoms did not improve.  At last office visit switched Plavix to Pletal 50 mg p.o. twice daily.  Patient now presents for 4-week follow-up.  Patient has not noticed improvement of claudication symptoms with cilostazol.   Blood pressure is elevated in the office today, however he monitors it at home regularly and reports home blood pressure readings which are well controlled.  Denies chest pain, syncope, SOB, palpitations, or difficulty breathing.  Past Medical History:  Diagnosis Date   Diabetes mellitus without complication (HCC)    GERD (gastroesophageal reflux disease)    Hypertension    Past Surgical History:  Procedure Laterality Date   KNEE SURGERY     PENILE PROSTHESIS IMPLANT     WRIST SURGERY     Family History  Problem Relation Age of Onset   Heart failure  Father     Social History   Tobacco Use   Smoking status: Former    Packs/day: 1.50    Years: 30.00    Pack years: 45.00    Types: Cigarettes, Cigars    Quit date: 2009    Years since quitting: 14.0   Smokeless tobacco: Never  Substance Use Topics   Alcohol use: Yes    Alcohol/week: 3.0 standard drinks    Types: 3 Cans of beer per week    Comment: daily   Marital Status: Married  ROS  Review of Systems  Cardiovascular:  Positive for claudication. Negative for chest pain, dyspnea on exertion, irregular heartbeat, leg swelling, near-syncope, orthopnea, palpitations and syncope.  Respiratory:  Negative for cough, shortness of breath and sleep disturbances due to breathing.   Gastrointestinal:  Negative for hematochezia and melena.  Genitourinary:  Negative for hematuria.  Objective  Blood pressure (!) 154/82, pulse 84, temperature 98.4 F (36.9 C), temperature source Temporal, resp. rate 17, height '6\' 1"'  (1.854 m), weight 205 lb (93 kg), SpO2 93 %. Body mass index is 27.05 kg/m.  Vitals with BMI 12/25/2021 11/29/2021 10/04/2021  Height '6\' 1"'  '6\' 1"'  -  Weight 205 lbs 205 lbs -  BMI 00.76 22.63 -  Systolic 335 456 256  Diastolic 82 83 79  Pulse 84 87 81    Physical Exam Constitutional:      General: He is not in acute distress.    Appearance: He is normal weight.  Neck:     Vascular: Carotid bruit (bilateral) present.  Cardiovascular:  Rate and Rhythm: Normal rate. Rhythm irregular.     Pulses:          Carotid pulses are 2+ on the right side and 2+ on the left side.      Radial pulses are 2+ on the right side and 2+ on the left side.       Femoral pulses are 2+ on the right side and 2+ on the left side.      Popliteal pulses are 2+ on the right side and 2+ on the left side.       Dorsalis pedis pulses are 1+ on the right side and 1+ on the left side.       Posterior tibial pulses are 2+ on the right side and 2+ on the left side.     Heart sounds: Normal heart sounds.  No murmur heard.   No gallop.  Pulmonary:     Effort: Pulmonary effort is normal. No respiratory distress.     Breath sounds: Normal breath sounds. No wheezing, rhonchi or rales.  Musculoskeletal:     Right lower leg: No edema.     Left lower leg: No edema.  Skin:    Comments: Bilateral lower leg moderate varicosities   Neurological:     Mental Status: He is alert.  Physical exam unchanged compared to previous office visit.  Laboratory examination:   No results for input(s): NA, K, CL, CO2, GLUCOSE, BUN, CREATININE, CALCIUM, GFRNONAA, GFRAA in the last 8760 hours. CrCl cannot be calculated (Patient's most recent lab result is older than the maximum 21 days allowed.).  CMP Latest Ref Rng & Units 06/01/2017  Glucose 65 - 99 mg/dL 249(H)  BUN 6 - 20 mg/dL 15  Creatinine 0.61 - 1.24 mg/dL 0.96  Sodium 135 - 145 mmol/L 135  Potassium 3.5 - 5.1 mmol/L 4.3  Chloride 101 - 111 mmol/L 99(L)  CO2 22 - 32 mmol/L 26  Calcium 8.9 - 10.3 mg/dL 9.6   CBC Latest Ref Rng & Units 06/01/2017 08/31/2009  WBC 4.0 - 10.5 K/uL 4.1 -  Hemoglobin 13.0 - 17.0 g/dL 15.1 14.2  Hematocrit 39.0 - 52.0 % 42.7 -  Platelets 150 - 400 K/uL 146(L) -    Lipid Panel No results for input(s): CHOL, TRIG, LDLCALC, VLDL, HDL, CHOLHDL, LDLDIRECT in the last 8760 hours. Lipid Panel  No results found for: CHOL, TRIG, HDL, CHOLHDL, VLDL, LDLCALC, LDLDIRECT, LABVLDL   HEMOGLOBIN A1C No results found for: HGBA1C, MPG TSH No results for input(s): TSH in the last 8760 hours.  External labs:  Labs 07/03/2021:  Hb 14.4/HCT 42.2, platelets 145, normal indicis.  TSH normal at 1.490. A1c 6.2%.  Total cholesterol 222, triglycerides 157, HDL 61, LDL 133.  Serum glucose 61 mg, BUN 14, creatinine 0.75, EGFR 101 mL, potassium 4.4  Allergies   Allergies  Allergen Reactions   Codeine    Keflex [Cephalexin]    Penicillins     Medication prior to this encounter:   Outpatient Medications Prior to Visit  Medication Sig  Dispense Refill   ACCU-CHEK GUIDE test strip USE 1 TEST STRIP TWICE DAILY     aspirin 81 MG chewable tablet Chew by mouth daily.     cetirizine (ZYRTEC) 10 MG tablet Take 10 mg by mouth as needed.  2   cilostazol (PLETAL) 50 MG tablet Take 1 tablet (50 mg total) by mouth 2 (two) times daily. 60 tablet 3   GLYXAMBI 25-5 MG TABS Take 1 tablet by mouth daily.  lisinopril (PRINIVIL,ZESTRIL) 10 MG tablet Take 10 mg by mouth in the morning and at bedtime.     Magnesium 300 MG CAPS Take 1 capsule by mouth daily.     metoprolol succinate (TOPROL-XL) 25 MG 24 hr tablet Take 0.5 tablets (12.5 mg total) by mouth daily. Take with or immediately following a meal. 45 tablet 3   NOVOLIN 70/30 RELION (70-30) 100 UNIT/ML injection SLIDING     omeprazole (PRILOSEC) 20 MG capsule Take 20 mg by mouth daily.     POTASSIUM CHLORIDE PO Take 1 tablet by mouth daily.     RELION INSULIN SYRINGE 31G X 15/64" 0.5 ML MISC USE 1 THREE TIMES DAILY     rosuvastatin (CRESTOR) 10 MG tablet Take 1 tablet (10 mg total) by mouth every other day. (Patient taking differently: Take 10 mg by mouth 2 (two) times a week.) 30 tablet 3   metFORMIN (GLUCOPHAGE) 500 MG tablet Take 500 mg by mouth 2 (two) times daily with a meal.     No facility-administered medications prior to visit.    Medication list after today's encounter   Current Outpatient Medications  Medication Instructions   ACCU-CHEK GUIDE test strip USE 1 TEST STRIP TWICE DAILY   aspirin 81 MG chewable tablet Oral, Daily   cetirizine (ZYRTEC) 10 mg, Oral, As needed   cilostazol (PLETAL) 50 mg, Oral, 2 times daily   GLYXAMBI 25-5 MG TABS 1 tablet, Oral, Daily   lisinopril (ZESTRIL) 10 mg, Oral, 2 times daily   Magnesium 300 MG CAPS 1 capsule, Oral, Daily   metoprolol succinate (TOPROL-XL) 12.5 mg, Oral, Daily, Take with or immediately following a meal.   NOVOLIN 70/30 RELION (70-30) 100 UNIT/ML injection SLIDING   omeprazole (PRILOSEC) 20 mg, Oral, Daily   POTASSIUM  CHLORIDE PO 1 tablet, Oral, Daily   RELION INSULIN SYRINGE 31G X 15/64" 0.5 ML MISC USE 1 THREE TIMES DAILY   rosuvastatin (CRESTOR) 10 mg, Oral, Every other day   Radiology:   No results found.  Cardiac Studies:  Lower Extremity Arterial Duplex 10/16/2021:  Moderate velocity increase at the right distal superficial femoral artery  suggests >50% stenosis.  No hemodynamically significant stenosis are  identified in the left lower extremity arterial system.  There is severe diffuse calcific plaque throughout the bilateral SFA and  EIA and CFA.  This exam reveals mildly decreased perfusion of the right lower extremity,  noted at the post tibial artery level (ABI 0.89) and moderately decreased  perfusion of the left lower extremity, noted at the post tibial artery  level (ABI 0.78 with severely abnormal monophasic waveform pattern at the  bilateral ankles).  PCV MYOCARDIAL PERFUSION WO LEXISCAN 10/01/2021 Exercise nuclear stress test was performed using Bruce protocol. Patient reached 5.8 METS, and 87% of age predicted maximum heart rate. Exercise capacity was low. No chest pain reported. Heart rate and hemodynamic response were normal. Stress EKG revealed no ischemic changes. Mildly decreased tracer uptake in inferior myocardium likely due to diaphragmatic attenuation. Normal wall motion and thickening. Stress LVEF calculated 44%, but visually appears 50-55%. Low risk study.  ABI 08/16/2021:  This exam reveals moderately decreased perfusion of the right lower extremity, noted at the post tibial artery level (ABI 0.76) and moderately decreased perfusion of the left lower extremity, noted at the anterior tibial artery level (ABI 0.69).  There is dampened waveform at the level of the ankle bilaterally.  If clinically indicated, consider complete lower extremity arterial duplex evaluation.   Carotid artery  duplex 08/16/2021:  Duplex suggests minimal stenosis in the right common carotid  artery.    There is mild mixed plaque.  Duplex suggests stenosis in the right  external carotid artery (<50%).  Duplex suggests stenosis in the left common carotid artery (<50%) with  moderate mixed plaque.  Antegrade right vertebral artery flow. Antegrade left vertebral artery  flow.  Follow up in one year is appropriate if clinically indicated.  Coronary calcium score 08/16/2021: Left Main: 120 LAD: 967 LCx: 41 RCA: 394 Total Agatston Score: 1523 MESA database percentile: 97 Aortic atherosclerosis. No acute extra cardiac abnormality.  EKG:   08/01/2021: Normal sinus rhythm at rate of 81 bpm, normal axis, incomplete right bundle branch block.  Single PAC otherwise normal EKG.  Assessment     ICD-10-CM   1. Claudication in peripheral vascular disease (HCC)  Q73.4 Basic metabolic panel    CBC    2. Hypercholesteremia  E78.00     3. Primary hypertension  I10        Medications Discontinued During This Encounter  Medication Reason   metFORMIN (GLUCOPHAGE) 500 MG tablet     No orders of the defined types were placed in this encounter.   Orders Placed This Encounter  Procedures   Basic metabolic panel   CBC    Recommendations:   Shawn Daniels is a 66 y.o. caucasian male with a past medical history significant for type 2 diabetes mellitus, hypertension, hyperlipidemia, GERD, and 40 pack year smoking history. He presents today by referral from Dr. Woody Seller for evaluation. 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   Patient underwent coronary calcium score revealed total score 1523 placing patient in the 97th percentile.  He subsequently underwent nuclear stress test which was overall low risk.  Given underlying coronary calcium at last office visit added Toprol-XL 12.5 mg p.o. daily to also improve hypertension control.  Patient complained of symptoms of claudication, therefore added  Plavix in combination with aspirin, however symptoms did not improve.  At last office visit switched Plavix to Pletal 50 mg p.o. twice daily.  Patient now presents for 4-week follow-up.  Patient's claudication symptoms are unchanged with switching from Plavix to Pletal.  Therefore recommend peripheral angiogram with potential intervention to patient's right SFA as discussed with Dr. Einar Gip at last office visit.  Discussed at length with patient regarding indications, risks, benefits of peripheral angiogram, patient verbalized understanding and wishes to proceed with potential intervention to his right SFA.  Patient's questions were addressed to his satisfaction.  In regard to hypertension, patient's home blood pressure is well controlled, therefore will not make changes at today's visit.  However advised patient to continue to monitor his blood pressure at home and notify usIf it remains >130/80 mmHg.  In regard to statin therapy, patient has had statin intolerance.  Encourage patient to take Crestor 10 mg every other day.  Patient's lipids remain uncontrolled.  Given history of statin intolerance and continued elevated lipids in the context of PAD we will screen patient for PREVAIL-ASCVD clinical trial (obicetrapib) for additional lipid management. Continue aspirin therapy.   Follow up after peripheral angiogram.   Alethia Berthold, PA-C 12/25/2021, 4:20 PM Office: [phone]

## 2022-01-01 NOTE — H&P (View-Only) (Signed)
OV 12/25/2020 copied for documentation    Primary Physician/Referring:  Glenda Chroman, MD  Patient ID: Shawn Daniels, male    DOB: [birth year], 66 y.o.   MRN: [id number]  Chief Complaint  Patient presents with   Claudication in peripheral vascular disease ([place])    4 WEEKS   HPI:    Shawn Daniels  is a 66 y.o. caucasian male with a past medical history significant for type 2 diabetes mellitus, mild bilateral carotid artery stenosis, hypertension, hyperlipidemia, GERD, and a roughly 40 pack year smoking history. He presents today by referral from Dr. Woody Seller for evaluation. 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   Patient underwent coronary calcium score revealed total score 1523 placing patient in the 97th percentile.  He subsequently underwent nuclear stress test which was overall low risk.  Given underlying coronary calcium at last office visit added Toprol-XL 12.5 mg p.o. daily to also improve hypertension control.  Patient complained of symptoms of claudication, therefore added Plavix in combination with aspirin, however symptoms did not improve.  At last office visit switched Plavix to Pletal 50 mg p.o. twice daily.  Patient now presents for 4-week follow-up.  Patient has not noticed improvement of claudication symptoms with cilostazol.   Blood pressure is elevated in the office today, however he monitors it at home regularly and reports home blood pressure readings which are well controlled.  Denies chest pain, syncope, SOB, palpitations, or difficulty breathing.  Past Medical History:  Diagnosis Date   Diabetes mellitus without complication (HCC)    GERD (gastroesophageal reflux disease)    Hypertension    Past Surgical History:  Procedure Laterality Date   KNEE SURGERY     PENILE PROSTHESIS IMPLANT     WRIST SURGERY     Family History  Problem Relation Age of Onset   Heart failure  Father     Social History   Tobacco Use   Smoking status: Former    Packs/day: 1.50    Years: 30.00    Pack years: 45.00    Types: Cigarettes, Cigars    Quit date: 2009    Years since quitting: 14.0   Smokeless tobacco: Never  Substance Use Topics   Alcohol use: Yes    Alcohol/week: 3.0 standard drinks    Types: 3 Cans of beer per week    Comment: daily   Marital Status: Married  ROS  Review of Systems  Cardiovascular:  Positive for claudication. Negative for chest pain, dyspnea on exertion, irregular heartbeat, leg swelling, near-syncope, orthopnea, palpitations and syncope.  Respiratory:  Negative for cough, shortness of breath and sleep disturbances due to breathing.   Gastrointestinal:  Negative for hematochezia and melena.  Genitourinary:  Negative for hematuria.  Objective  Blood pressure (!) 154/82, pulse 84, temperature 98.4 F (36.9 C), temperature source Temporal, resp. rate 17, height '6\' 1"'  (1.854 m), weight 205 lb (93 kg), SpO2 93 %. Body mass index is 27.05 kg/m.  Vitals with BMI 12/25/2021 11/29/2021 10/04/2021  Height '6\' 1"'  '6\' 1"'  -  Weight 205 lbs 205 lbs -  BMI 12.45 80.99 -  Systolic 833 825 053  Diastolic 82 83 79  Pulse 84 87 81    Physical Exam Constitutional:      General: He is not in acute distress.    Appearance: He is normal weight.  Neck:     Vascular: Carotid bruit (bilateral) present.  Cardiovascular:  Rate and Rhythm: Normal rate. Rhythm irregular.     Pulses:          Carotid pulses are 2+ on the right side and 2+ on the left side.      Radial pulses are 2+ on the right side and 2+ on the left side.       Femoral pulses are 2+ on the right side and 2+ on the left side.      Popliteal pulses are 2+ on the right side and 2+ on the left side.       Dorsalis pedis pulses are 1+ on the right side and 1+ on the left side.       Posterior tibial pulses are 2+ on the right side and 2+ on the left side.     Heart sounds: Normal heart sounds.  No murmur heard.   No gallop.  Pulmonary:     Effort: Pulmonary effort is normal. No respiratory distress.     Breath sounds: Normal breath sounds. No wheezing, rhonchi or rales.  Musculoskeletal:     Right lower leg: No edema.     Left lower leg: No edema.  Skin:    Comments: Bilateral lower leg moderate varicosities   Neurological:     Mental Status: He is alert.  Physical exam unchanged compared to previous office visit.  Laboratory examination:   No results for input(s): NA, K, CL, CO2, GLUCOSE, BUN, CREATININE, CALCIUM, GFRNONAA, GFRAA in the last 8760 hours. CrCl cannot be calculated (Patient's most recent lab result is older than the maximum 21 days allowed.).  CMP Latest Ref Rng & Units 06/01/2017  Glucose 65 - 99 mg/dL 249(H)  BUN 6 - 20 mg/dL 15  Creatinine 0.61 - 1.24 mg/dL 0.96  Sodium 135 - 145 mmol/L 135  Potassium 3.5 - 5.1 mmol/L 4.3  Chloride 101 - 111 mmol/L 99(L)  CO2 22 - 32 mmol/L 26  Calcium 8.9 - 10.3 mg/dL 9.6   CBC Latest Ref Rng & Units 06/01/2017 08/31/2009  WBC 4.0 - 10.5 K/uL 4.1 -  Hemoglobin 13.0 - 17.0 g/dL 15.1 14.2  Hematocrit 39.0 - 52.0 % 42.7 -  Platelets 150 - 400 K/uL 146(L) -    Lipid Panel No results for input(s): CHOL, TRIG, LDLCALC, VLDL, HDL, CHOLHDL, LDLDIRECT in the last 8760 hours. Lipid Panel  No results found for: CHOL, TRIG, HDL, CHOLHDL, VLDL, LDLCALC, LDLDIRECT, LABVLDL   HEMOGLOBIN A1C No results found for: HGBA1C, MPG TSH No results for input(s): TSH in the last 8760 hours.  External labs:  Labs 07/03/2021:  Hb 14.4/HCT 42.2, platelets 145, normal indicis.  TSH normal at 1.490. A1c 6.2%.  Total cholesterol 222, triglycerides 157, HDL 61, LDL 133.  Serum glucose 61 mg, BUN 14, creatinine 0.75, EGFR 101 mL, potassium 4.4  Allergies   Allergies  Allergen Reactions   Codeine    Keflex [Cephalexin]    Penicillins     Medication prior to this encounter:   Outpatient Medications Prior to Visit  Medication Sig  Dispense Refill   ACCU-CHEK GUIDE test strip USE 1 TEST STRIP TWICE DAILY     aspirin 81 MG chewable tablet Chew by mouth daily.     cetirizine (ZYRTEC) 10 MG tablet Take 10 mg by mouth as needed.  2   cilostazol (PLETAL) 50 MG tablet Take 1 tablet (50 mg total) by mouth 2 (two) times daily. 60 tablet 3   GLYXAMBI 25-5 MG TABS Take 1 tablet by mouth daily.  lisinopril (PRINIVIL,ZESTRIL) 10 MG tablet Take 10 mg by mouth in the morning and at bedtime.     Magnesium 300 MG CAPS Take 1 capsule by mouth daily.     metoprolol succinate (TOPROL-XL) 25 MG 24 hr tablet Take 0.5 tablets (12.5 mg total) by mouth daily. Take with or immediately following a meal. 45 tablet 3   NOVOLIN 70/30 RELION (70-30) 100 UNIT/ML injection SLIDING     omeprazole (PRILOSEC) 20 MG capsule Take 20 mg by mouth daily.     POTASSIUM CHLORIDE PO Take 1 tablet by mouth daily.     RELION INSULIN SYRINGE 31G X 15/64" 0.5 ML MISC USE 1 THREE TIMES DAILY     rosuvastatin (CRESTOR) 10 MG tablet Take 1 tablet (10 mg total) by mouth every other day. (Patient taking differently: Take 10 mg by mouth 2 (two) times a week.) 30 tablet 3   metFORMIN (GLUCOPHAGE) 500 MG tablet Take 500 mg by mouth 2 (two) times daily with a meal.     No facility-administered medications prior to visit.    Medication list after today's encounter   Current Outpatient Medications  Medication Instructions   ACCU-CHEK GUIDE test strip USE 1 TEST STRIP TWICE DAILY   aspirin 81 MG chewable tablet Oral, Daily   cetirizine (ZYRTEC) 10 mg, Oral, As needed   cilostazol (PLETAL) 50 mg, Oral, 2 times daily   GLYXAMBI 25-5 MG TABS 1 tablet, Oral, Daily   lisinopril (ZESTRIL) 10 mg, Oral, 2 times daily   Magnesium 300 MG CAPS 1 capsule, Oral, Daily   metoprolol succinate (TOPROL-XL) 12.5 mg, Oral, Daily, Take with or immediately following a meal.   NOVOLIN 70/30 RELION (70-30) 100 UNIT/ML injection SLIDING   omeprazole (PRILOSEC) 20 mg, Oral, Daily   POTASSIUM  CHLORIDE PO 1 tablet, Oral, Daily   RELION INSULIN SYRINGE 31G X 15/64" 0.5 ML MISC USE 1 THREE TIMES DAILY   rosuvastatin (CRESTOR) 10 mg, Oral, Every other day   Radiology:   No results found.  Cardiac Studies:  Lower Extremity Arterial Duplex 10/16/2021:  Moderate velocity increase at the right distal superficial femoral artery  suggests >50% stenosis.  No hemodynamically significant stenosis are  identified in the left lower extremity arterial system.  There is severe diffuse calcific plaque throughout the bilateral SFA and  EIA and CFA.  This exam reveals mildly decreased perfusion of the right lower extremity,  noted at the post tibial artery level (ABI 0.89) and moderately decreased  perfusion of the left lower extremity, noted at the post tibial artery  level (ABI 0.78 with severely abnormal monophasic waveform pattern at the  bilateral ankles).  PCV MYOCARDIAL PERFUSION WO LEXISCAN 10/01/2021 Exercise nuclear stress test was performed using Bruce protocol. Patient reached 5.8 METS, and 87% of age predicted maximum heart rate. Exercise capacity was low. No chest pain reported. Heart rate and hemodynamic response were normal. Stress EKG revealed no ischemic changes. Mildly decreased tracer uptake in inferior myocardium likely due to diaphragmatic attenuation. Normal wall motion and thickening. Stress LVEF calculated 44%, but visually appears 50-55%. Low risk study.  ABI 08/16/2021:  This exam reveals moderately decreased perfusion of the right lower extremity, noted at the post tibial artery level (ABI 0.76) and moderately decreased perfusion of the left lower extremity, noted at the anterior tibial artery level (ABI 0.69).  There is dampened waveform at the level of the ankle bilaterally.  If clinically indicated, consider complete lower extremity arterial duplex evaluation.   Carotid artery  duplex 08/16/2021:  Duplex suggests minimal stenosis in the right common carotid  artery.    There is mild mixed plaque.  Duplex suggests stenosis in the right  external carotid artery (<50%).  Duplex suggests stenosis in the left common carotid artery (<50%) with  moderate mixed plaque.  Antegrade right vertebral artery flow. Antegrade left vertebral artery  flow.  Follow up in one year is appropriate if clinically indicated.  Coronary calcium score 08/16/2021: Left Main: 120 LAD: 967 LCx: 41 RCA: 394 Total Agatston Score: 1523 MESA database percentile: 97 Aortic atherosclerosis. No acute extra cardiac abnormality.  EKG:   08/01/2021: Normal sinus rhythm at rate of 81 bpm, normal axis, incomplete right bundle branch block.  Single PAC otherwise normal EKG.  Assessment     ICD-10-CM   1. Claudication in peripheral vascular disease (HCC)  R97.5 Basic metabolic panel    CBC    2. Hypercholesteremia  E78.00     3. Primary hypertension  I10        Medications Discontinued During This Encounter  Medication Reason   metFORMIN (GLUCOPHAGE) 500 MG tablet     No orders of the defined types were placed in this encounter.   Orders Placed This Encounter  Procedures   Basic metabolic panel   CBC    Recommendations:   Shawn Daniels is a 66 y.o. caucasian male with a past medical history significant for type 2 diabetes mellitus, hypertension, hyperlipidemia, GERD, and 40 pack year smoking history. He presents today by referral from Dr. Woody Seller for evaluation. 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   Patient underwent coronary calcium score revealed total score 1523 placing patient in the 97th percentile.  He subsequently underwent nuclear stress test which was overall low risk.  Given underlying coronary calcium at last office visit added Toprol-XL 12.5 mg p.o. daily to also improve hypertension control.  Patient complained of symptoms of claudication, therefore added  Plavix in combination with aspirin, however symptoms did not improve.  At last office visit switched Plavix to Pletal 50 mg p.o. twice daily.  Patient now presents for 4-week follow-up.  Patient's claudication symptoms are unchanged with switching from Plavix to Pletal.  Therefore recommend peripheral angiogram with potential intervention to patient's right SFA as discussed with Dr. Einar Gip at last office visit.  Discussed at length with patient regarding indications, risks, benefits of peripheral angiogram, patient verbalized understanding and wishes to proceed with potential intervention to his right SFA.  Patient's questions were addressed to his satisfaction.  In regard to hypertension, patient's home blood pressure is well controlled, therefore will not make changes at today's visit.  However advised patient to continue to monitor his blood pressure at home and notify usIf it remains >130/80 mmHg.  In regard to statin therapy, patient has had statin intolerance.  Encourage patient to take Crestor 10 mg every other day.  Patient's lipids remain uncontrolled.  Given history of statin intolerance and continued elevated lipids in the context of PAD we will screen patient for PREVAIL-ASCVD clinical trial (obicetrapib) for additional lipid management. Continue aspirin therapy.   Follow up after peripheral angiogram.   Alethia Berthold, PA-C 12/25/2021, 4:20 PM Office: [phone]

## 2022-01-01 NOTE — Interval H&P Note (Signed)
History and Physical Interval Note:  01/01/2022 9:06 AM  Shawn Daniels  has presented today for surgery, with the diagnosis of pad.  The various methods of treatment have been discussed with the patient and family. After consideration of risks, benefits and other options for treatment, the patient has consented to  Procedure(s): LOWER EXTREMITY ANGIOGRAPHY (N/A) as a surgical intervention.  The patient's history has been reviewed, patient examined, no change in status, stable for surgery.  I have reviewed the patient's chart and labs.  Questions were answered to the patient's satisfaction.      [name]

## 2022-01-03 ENCOUNTER — Telehealth: Payer: Self-pay | Admitting: Cardiology

## 2022-01-03 NOTE — Telephone Encounter (Signed)
Patient called our office stating that he is in significant amount of pain and he was disappointed that he did not get revascularization.  I explained to him that after the procedure I personally went and met his wife in the waiting room, drew the angiogram on a piece of paper and explained that he has lesions in the right common femoral artery that are calcific and 2 other lesions in the SFA and distal SFA/popliteal artery, as the inflow is significantly affected that a simple surgical patch angioplasty may fix most of his problems and continued medical therapy will help with the symptom relief.  I again explained this to the patient over the telephone today.  He does not want to have surgical procedure, he would like me to attempt angioplasty.  I have explained to him there are options available for [place] to perform angioplasty, but I did discuss with him the risk of dissection, urgent surgical need and potential for embolic complications in detail.  I have given him at least 1 to 2% risk of this complications happening including but not limited to less than 1% risk of bleeding, infection.  Patient prefers to go for percutaneous revascularization.  I will set him up for angioplasty in the near future within the next couple weeks.

## 2022-01-04 ENCOUNTER — Encounter: Payer: Self-pay | Admitting: Cardiology

## 2022-01-04 ENCOUNTER — Telehealth: Payer: Self-pay

## 2022-01-04 NOTE — Telephone Encounter (Signed)
I have sent a message to front desk to schedule him for peripheral arteriogram ASAP, please follow-up.  I have left a message to the patient, explained to him in detail that his symptoms are related to vascular disease, in view of his symptoms, clearly suggest he should get relief with the procedure.  I do not think he has orthopedic issues as indicated by the message.

## 2022-01-04 NOTE — Telephone Encounter (Signed)
Patient called back again that he is feeling discomfort in his right leg and tightness in his right calf. When he sits he feels pain/numbness in his hip. He is concerned and worried and is requesting a call back from you please advise.   Patient would like to know as soon as possible if he is going to get cath because he needs to make his job aware.

## 2022-01-08 ENCOUNTER — Ambulatory Visit (HOSPITAL_COMMUNITY)
Admission: RE | Admit: 2022-01-08 | Discharge: 2022-01-08 | Disposition: A | Discharge status: Home / Self Care | Payer: Managed Care, Other (non HMO) | Attending: Cardiology | Admitting: Cardiology

## 2022-01-08 ENCOUNTER — Encounter (HOSPITAL_COMMUNITY)
Admission: RE | Disposition: A | Discharge status: Home / Self Care | Payer: Self-pay | Source: Home / Self Care | Attending: Cardiology

## 2022-01-08 DIAGNOSIS — I6523 Occlusion and stenosis of bilateral carotid arteries: Secondary | ICD-10-CM | POA: Insufficient documentation

## 2022-01-08 DIAGNOSIS — I70211 Atherosclerosis of native arteries of extremities with intermittent claudication, right leg: Secondary | ICD-10-CM | POA: Diagnosis not present

## 2022-01-08 DIAGNOSIS — K219 Gastro-esophageal reflux disease without esophagitis: Secondary | ICD-10-CM | POA: Insufficient documentation

## 2022-01-08 DIAGNOSIS — I10 Essential (primary) hypertension: Secondary | ICD-10-CM | POA: Insufficient documentation

## 2022-01-08 DIAGNOSIS — Z87891 Personal history of nicotine dependence: Secondary | ICD-10-CM | POA: Insufficient documentation

## 2022-01-08 DIAGNOSIS — E7800 Pure hypercholesterolemia, unspecified: Secondary | ICD-10-CM | POA: Diagnosis not present

## 2022-01-08 DIAGNOSIS — I739 Peripheral vascular disease, unspecified: Secondary | ICD-10-CM | POA: Diagnosis present

## 2022-01-08 DIAGNOSIS — E1151 Type 2 diabetes mellitus with diabetic peripheral angiopathy without gangrene: Secondary | ICD-10-CM | POA: Insufficient documentation

## 2022-01-08 HISTORY — PX: PERIPHERAL VASCULAR BALLOON ANGIOPLASTY: CATH118281

## 2022-01-08 LAB — POCT I-STAT, CHEM 8
BUN: 23 mg/dL (ref 8–23)
Calcium, Ion: 1.2 mmol/L (ref 1.15–1.40)
Chloride: 101 mmol/L (ref 98–111)
Creatinine, Ser: 0.8 mg/dL (ref 0.61–1.24)
Glucose, Bld: 188 mg/dL — ABNORMAL HIGH (ref 70–99)
HCT: 38 % — ABNORMAL LOW (ref 39.0–52.0)
Hemoglobin: 12.9 g/dL — ABNORMAL LOW (ref 13.0–17.0)
Potassium: 4.7 mmol/L (ref 3.5–5.1)
Sodium: 136 mmol/L (ref 135–145)
TCO2: 25 mmol/L (ref 22–32)

## 2022-01-08 LAB — POCT ACTIVATED CLOTTING TIME
Activated Clotting Time: 269 seconds
Activated Clotting Time: 263 seconds
Activated Clotting Time: 275 seconds

## 2022-01-08 LAB — GLUCOSE, CAPILLARY: Glucose-Capillary: 208 mg/dL — ABNORMAL HIGH (ref 70–99)

## 2022-01-08 SURGERY — PERIPHERAL VASCULAR BALLOON ANGIOPLASTY
Anesthesia: LOCAL

## 2022-01-08 MED ORDER — FENTANYL CITRATE (PF) 100 MCG/2ML IJ SOLN
INTRAMUSCULAR | Status: DC | PRN
  Administered 2022-01-08 (×4): 50 ug via INTRAVENOUS

## 2022-01-08 MED ORDER — SODIUM CHLORIDE 0.9 % WEIGHT BASED INFUSION: 1.0000 mL/kg/h | INTRAVENOUS | Status: DC

## 2022-01-08 MED ORDER — ONDANSETRON HCL 4 MG/2ML IJ SOLN
INTRAMUSCULAR | Status: DC | PRN
  Administered 2022-01-08: 4 mg via INTRAVENOUS

## 2022-01-08 MED ORDER — SODIUM CHLORIDE 0.9 % IV SOLN: 250.0000 mL | INTRAVENOUS | Status: DC | PRN

## 2022-01-08 MED ORDER — HEPARIN (PORCINE) IN NACL 1000-0.9 UT/500ML-% IV SOLN
INTRAVENOUS | Status: DC | PRN
  Administered 2022-01-08 (×2): 500 mL

## 2022-01-08 MED ORDER — CEFAZOLIN SODIUM-DEXTROSE 2-4 GM/100ML-% IV SOLN
INTRAVENOUS | Status: AC
  Filled 2022-01-08: qty 100

## 2022-01-08 MED ORDER — HEPARIN SODIUM (PORCINE) 1000 UNIT/ML IJ SOLN
INTRAMUSCULAR | Status: DC | PRN
  Administered 2022-01-08: 2000 [IU] via INTRAVENOUS
  Administered 2022-01-08: 3000 [IU] via INTRAVENOUS
  Administered 2022-01-08: 9000 [IU] via INTRAVENOUS
  Administered 2022-01-08: 1000 [IU] via INTRAVENOUS

## 2022-01-08 MED ORDER — FENTANYL CITRATE (PF) 100 MCG/2ML IJ SOLN
INTRAMUSCULAR | Status: AC
  Filled 2022-01-08: qty 2

## 2022-01-08 MED ORDER — MIDAZOLAM HCL 2 MG/2ML IJ SOLN
INTRAMUSCULAR | Status: AC
  Filled 2022-01-08: qty 2

## 2022-01-08 MED ORDER — NITROGLYCERIN 1 MG/10 ML FOR IR/CATH LAB
INTRA_ARTERIAL | Status: DC | PRN
  Administered 2022-01-08: 400 ug via INTRA_ARTERIAL

## 2022-01-08 MED ORDER — MIDAZOLAM HCL 2 MG/2ML IJ SOLN
INTRAMUSCULAR | Status: DC | PRN
  Administered 2022-01-08: 2 mg via INTRAVENOUS
  Administered 2022-01-08: 1 mg via INTRAVENOUS

## 2022-01-08 MED ORDER — CLOPIDOGREL BISULFATE 300 MG PO TABS
ORAL_TABLET | ORAL | Status: DC | PRN
  Administered 2022-01-08: 600 mg via ORAL

## 2022-01-08 MED ORDER — CLOPIDOGREL BISULFATE 75 MG PO TABS: 75.0000 mg | ORAL_TABLET | Freq: Every day | ORAL | 1 refills | Status: AC

## 2022-01-08 MED ORDER — ONDANSETRON HCL 4 MG/2ML IJ SOLN
INTRAMUSCULAR | Status: AC
  Filled 2022-01-08: qty 2

## 2022-01-08 MED ORDER — LIDOCAINE HCL (PF) 1 % IJ SOLN
INTRAMUSCULAR | Status: AC
  Filled 2022-01-08: qty 30

## 2022-01-08 MED ORDER — SODIUM CHLORIDE 0.9% FLUSH: 3.0000 mL | INTRAVENOUS | Status: DC | PRN

## 2022-01-08 MED ORDER — SODIUM CHLORIDE 0.9% FLUSH: 3.0000 mL | Freq: Two times a day (BID) | INTRAVENOUS | Status: DC

## 2022-01-08 MED ORDER — SODIUM CHLORIDE 0.9 % IV BOLUS
500.0000 mL | Freq: Once | INTRAVENOUS | Status: DC
Start: 2022-01-08 — End: 2022-01-08

## 2022-01-08 MED ORDER — SODIUM CHLORIDE 0.9 % IV SOLN: INTRAVENOUS | Status: DC

## 2022-01-08 MED ORDER — ACETAMINOPHEN 325 MG PO TABS
650.0000 mg | ORAL_TABLET | ORAL | Status: DC | PRN
  Administered 2022-01-08: 650 mg via ORAL
  Filled 2022-01-08: qty 2

## 2022-01-08 MED ORDER — HEPARIN SODIUM (PORCINE) 1000 UNIT/ML IJ SOLN
INTRAMUSCULAR | Status: AC
  Filled 2022-01-08: qty 10

## 2022-01-08 MED ORDER — NITROGLYCERIN 1 MG/10 ML FOR IR/CATH LAB
INTRA_ARTERIAL | Status: AC
  Filled 2022-01-08: qty 10

## 2022-01-08 MED ORDER — CEFAZOLIN SODIUM-DEXTROSE 2-3 GM-%(50ML) IV SOLR
INTRAVENOUS | Status: DC | PRN
  Administered 2022-01-08: 2 g via INTRAVENOUS

## 2022-01-08 MED ORDER — CLOPIDOGREL BISULFATE 300 MG PO TABS
ORAL_TABLET | ORAL | Status: AC
  Filled 2022-01-08: qty 1

## 2022-01-08 MED ORDER — LIDOCAINE HCL (PF) 1 % IJ SOLN
INTRAMUSCULAR | Status: DC | PRN
  Administered 2022-01-08: 20 mL

## 2022-01-08 SURGICAL SUPPLY — 25 items
BAG SNAP BAND KOVER 36X36 (MISCELLANEOUS) ×2 IMPLANT
BALL SAPPHIRE NC24 5.0X22 (BALLOONS) ×3
BALLN COYOTE ES OTW 4X20X143 (BALLOONS) ×3
BALLOON COYOTE ES OTW 4X20X143 (BALLOONS) ×1 IMPLANT
BALLOON SAPPHIRE NC24 5.0X22 (BALLOONS) ×1 IMPLANT
CATH CROSS OVER TEMPO 5F (CATHETERS) ×2 IMPLANT
CATH SHOCKWAVE M5 5.5X60 (CATHETERS) ×2 IMPLANT
CATH SHOCKWAVE M5 6.0X60 (CATHETERS) ×2 IMPLANT
CATH SHOCKWAVE M5 8.0X60 (CATHETERS) ×2 IMPLANT
CATH SOFT-VU 4F 65 STRAIGHT (CATHETERS) ×1 IMPLANT
CATH SOFT-VU STRAIGHT 4F 65CM (CATHETERS) ×3
CLOSURE PERCLOSE PROSTYLE (VASCULAR PRODUCTS) ×2 IMPLANT
GUIDEWIRE ANGLED .035X150CM (WIRE) ×2 IMPLANT
KIT ENCORE 26 ADVANTAGE (KITS) ×4 IMPLANT
KIT PV (KITS) ×4 IMPLANT
SHEATH DESTINATION 8F 45CM (SHEATH) ×2 IMPLANT
SHEATH PINNACLE 5F 10CM (SHEATH) ×2 IMPLANT
SHEATH PINNACLE MP 7F 45CM (SHEATH) ×2 IMPLANT
TRANSDUCER W/STOPCOCK (MISCELLANEOUS) ×4 IMPLANT
TRAY PV CATH (CUSTOM PROCEDURE TRAY) ×4 IMPLANT
TUBING CIL FLEX 10 FLL-RA (TUBING) ×2 IMPLANT
WIRE HITORQ VERSACORE ST 145CM (WIRE) ×2 IMPLANT
WIRE MICRO SET SILHO 5FR 7 (SHEATH) ×2 IMPLANT
WIRE RUNTHROUGH .014X300CM (WIRE) ×2 IMPLANT
WIRE SPARTACORE .014X300CM (WIRE) ×2 IMPLANT

## 2022-01-08 NOTE — Interval H&P Note (Signed)
History and Physical Interval Note:  01/08/2022 9:32 AM  Shawn Daniels  has presented today for surgery, with the diagnosis of claudication.  The various methods of treatment have been discussed with the patient and family. After consideration of risks, benefits and other options for treatment, the patient has consented to  Procedure(s): LOWER EXTREMITY ANGIOGRAPHY (N/A) as a surgical intervention.  The patient's history has been reviewed, patient examined, no change in status, stable for surgery.  I have reviewed the patient's chart and labs.  Questions were answered to the patient's satisfaction.    Please see my extensive discussion with the patient in the telephone encounter, all risks and benefits have been extensively discussed with the patient, patient prefers percutaneous revascularization.  He is now scheduled for angioplasty of the right common femoral artery and possibly of the SFA and P2 segment of the popliteal artery.  Yates Decamp

## 2022-01-09 ENCOUNTER — Encounter (HOSPITAL_COMMUNITY): Payer: Self-pay | Admitting: Cardiology

## 2022-01-09 ENCOUNTER — Encounter: Payer: Self-pay | Admitting: Cardiology

## 2022-01-09 NOTE — Telephone Encounter (Signed)
From pt

## 2022-01-16 ENCOUNTER — Other Ambulatory Visit: Payer: Self-pay

## 2022-01-16 ENCOUNTER — Encounter: Payer: Self-pay | Admitting: Student

## 2022-01-16 ENCOUNTER — Ambulatory Visit: Payer: Managed Care, Other (non HMO) | Admitting: Student

## 2022-01-16 VITALS — BP 162/75 | HR 95 | Ht 73.0 in | Wt 208.0 lb

## 2022-01-16 DIAGNOSIS — I10 Essential (primary) hypertension: Secondary | ICD-10-CM

## 2022-01-16 DIAGNOSIS — E7800 Pure hypercholesterolemia, unspecified: Secondary | ICD-10-CM

## 2022-01-16 DIAGNOSIS — I739 Peripheral vascular disease, unspecified: Secondary | ICD-10-CM

## 2022-01-16 DIAGNOSIS — I6523 Occlusion and stenosis of bilateral carotid arteries: Secondary | ICD-10-CM

## 2022-01-16 NOTE — Progress Notes (Signed)
Primary Physician/Referring:  Glenda Chroman, MD  Patient ID: Shawn Daniels, male    DOB: 1956/08/08, 66 y.o.   MRN: [id number]  Chief Complaint  Patient presents with   Claudication   Follow-up   HPI:    Shawn Daniels  is a 66 y.o. caucasian male with a past medical history significant for type 2 diabetes mellitus, mild bilateral carotid artery stenosis, hypertension, hyperlipidemia, GERD, and a roughly 40 pack year smoking history. He presents today by referral from Dr. Woody Seller for evaluation. 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   Patient underwent coronary calcium score revealed total score 1523 placing patient in the 97th percentile.  He subsequently underwent nuclear stress test which was overall low risk.  Patient has experienced worsening claudication, attempted medical therapy with aspirin, Plavix, as well as Pletal.  Unfortunately patient's symptoms continued, therefore recommended peripheral angiography.  Patient underwent lower extremity angiography at 01/01/2022 demonstrating calcified lesions in the right leg, recommended surgical evaluation.  However patient was resistant to surgery referral and instead opted to undergo high risk angioplasty.  Patient underwent peripheral arteriogram and shockwave arthrectomy of right lower extremity 01/08/2022, details below.  Patient was discharged with aspirin and Plavix for minimum of 6 months.  Patient now presents for follow up.  Patient is recovering well from peripheral angioplasty.  Reports symptoms of claudication have essentially resolved in his right leg.  He did discontinue cilostazol, however he also mistakingly discontinued metoprolol following his procedure, therefore blood pressure and heart rate are elevated at today's office visit.  He is tolerating dual antiplatelet therapy with aspirin and Plavix without issue.  Denies chest pain,  syncope, SOB, palpitations, or difficulty breathing.  Past Medical History:  Diagnosis Date   Diabetes mellitus without complication (HCC)    GERD (gastroesophageal reflux disease)    Hypertension    Past Surgical History:  Procedure Laterality Date   KNEE SURGERY     LOWER EXTREMITY ANGIOGRAPHY N/A 01/01/2022   Procedure: LOWER EXTREMITY ANGIOGRAPHY;  Surgeon: Nigel Mormon, MD;  Location: [facility];  Service: Cardiovascular;  Laterality: N/A;   PENILE PROSTHESIS IMPLANT     PERIPHERAL VASCULAR BALLOON ANGIOPLASTY  01/08/2022   Procedure: PERIPHERAL VASCULAR BALLOON ANGIOPLASTY;  Surgeon: Adrian Prows, MD;  Location: [facility];  Service: Cardiovascular;;   WRIST SURGERY     Family History  Problem Relation Age of Onset   Heart failure Father     Social History   Tobacco Use   Smoking status: Former    Packs/day: 1.50    Years: 30.00    Pack years: 45.00    Types: Cigarettes, Cigars    Quit date: 2009    Years since quitting: 14.0   Smokeless tobacco: Never  Substance Use Topics   Alcohol use: Yes    Alcohol/week: 3.0 standard drinks    Types: 3 Cans of beer per week    Comment: daily   Marital Status: Married  ROS  Review of Systems  Cardiovascular:  Positive for claudication (essentially resolved). Negative for chest pain, dyspnea on exertion, irregular heartbeat, leg swelling, near-syncope, orthopnea, palpitations and syncope.  Respiratory:  Negative for cough, shortness of breath and sleep disturbances due to breathing.   Gastrointestinal:  Negative for hematochezia and melena.  Genitourinary:  Negative for hematuria.  Objective  Blood pressure (!) 162/75, pulse 95, height _0  (1.854 m), weight 208 lb (  94.3 kg), SpO2 97 %. Body mass index is 27.44 kg/m.  Vitals with BMI 01/16/2022 01/08/2022 01/08/2022  Height _0  - -  Weight 208 lbs - -  BMI 18.84 - -  Systolic 166 063 016  Diastolic 75 78 48  Pulse 95 93 97    Physical  Exam Constitutional:      General: He is not in acute distress.    Appearance: He is normal weight.  Neck:     Vascular: Carotid bruit (bilateral) present.  Cardiovascular:     Rate and Rhythm: Normal rate and regular rhythm.     Pulses:          Carotid pulses are 2+ on the right side and 2+ on the left side.      Radial pulses are 2+ on the right side and 2+ on the left side.       Femoral pulses are 2+ on the right side and 2+ on the left side.      Popliteal pulses are 2+ on the right side and 2+ on the left side.       Dorsalis pedis pulses are 1+ on the right side and 1+ on the left side.       Posterior tibial pulses are 2+ on the right side and 2+ on the left side.     Heart sounds: Normal heart sounds. No murmur heard.   No gallop.     Comments: Peripheral angiography access sites healing well.  Mild ecchymosis.  No evidence of hematoma or bruit Pulmonary:     Effort: Pulmonary effort is normal. No respiratory distress.     Breath sounds: Normal breath sounds. No wheezing, rhonchi or rales.  Musculoskeletal:     Right lower leg: No edema.     Left lower leg: No edema.  Skin:    Comments: Bilateral lower leg moderate varicosities   Neurological:     Mental Status: He is alert.   Laboratory examination:   Recent Labs    12/26/21 1633 01/01/22 0803 01/08/22 0806  NA 139 136 136  K 4.0 4.8 4.7  CL 99 99 101  CO2 24  --   --   GLUCOSE 51* 204* 188*  BUN _1 CREATININE 0.82 0.80 0.80  CALCIUM 9.7  --   --    estimated creatinine clearance is 105.4 mL/min (by C-G formula based on SCr of 0.8 mg/dL).  CMP Latest Ref Rng & Units 01/08/2022 01/01/2022 12/26/2021  Glucose 70 - 99 mg/dL 188(H) 204(H) 51(L)  BUN 8 - 23 mg/dL _2 Creatinine 0.61 - 1.24 mg/dL 0.80 0.80 0.82  Sodium 135 - 145 mmol/L 136 136 139  Potassium 3.5 - 5.1 mmol/L 4.7 4.8 4.0  Chloride 98 - 111 mmol/L 101 99 99  CO2 20 - 29 mmol/L - - 24  Calcium 8.6 - 10.2 mg/dL - - 9.7   CBC Latest  Ref Rng & Units 01/08/2022 01/01/2022 12/26/2021  WBC 3.4 - 10.8 x10E3/uL - - 6.1  Hemoglobin 13.0 - 17.0 g/dL 12.9(L) 14.6 13.3  Hematocrit 39.0 - 52.0 % 38.0(L) 43.0 38.1  Platelets 150 - 450 x10E3/uL - - 154    Lipid Panel No results for input(s): CHOL, TRIG, LDLCALC, VLDL, HDL, CHOLHDL, LDLDIRECT in the last 8760 hours. Lipid Panel  No results found for: CHOL, TRIG, HDL, CHOLHDL, VLDL, LDLCALC, LDLDIRECT, LABVLDL   HEMOGLOBIN A1C No results found for: HGBA1C, MPG TSH No results for input(s):  TSH in the last 8760 hours.  External labs:  Labs 07/03/2021:  Hb 14.4/HCT 42.2, platelets 145, normal indicis.  TSH normal at 1.490. A1c 6.2%.  Total cholesterol 222, triglycerides 157, HDL 61, LDL 133.  Serum glucose 61 mg, BUN 14, creatinine 0.75, EGFR 101 mL, potassium 4.4  Allergies   Allergies  Allergen Reactions   Codeine Itching    Can take in lower doses   Keflex [Cephalexin] Other (See Comments)    Looked older   Penicillins Other (See Comments)    Childhood    Medication prior to this encounter:   Outpatient Medications Prior to Visit  Medication Sig Dispense Refill   ACCU-CHEK GUIDE test strip USE 1 TEST STRIP TWICE DAILY     acetaminophen (TYLENOL) 500 MG tablet Take 1,000 mg by mouth every 6 (six) hours as needed for headache.     aspirin 81 MG chewable tablet Chew 81 mg by mouth at bedtime.     clopidogrel (PLAVIX) 75 MG tablet Take 1 tablet (75 mg total) by mouth daily. 90 tablet 1   GLYXAMBI 25-5 MG TABS Take 1 tablet by mouth daily.     lisinopril (PRINIVIL,ZESTRIL) 10 MG tablet Take 20 mg by mouth in the morning and at bedtime.     Magnesium 400 MG CAPS Take 400 mg by mouth daily.     NOVOLIN 70/30 RELION (70-30) 100 UNIT/ML injection 25-30 Units 2 (two) times daily with a meal. SLIDING Scale     omeprazole (PRILOSEC) 20 MG capsule Take 20 mg by mouth daily.     POTASSIUM CHLORIDE PO Take 99 mg by mouth daily.     RELION INSULIN SYRINGE 31G X 15/64" 0.5 ML  MISC USE 1 THREE TIMES DAILY     rosuvastatin (CRESTOR) 10 MG tablet Take 1 tablet (10 mg total) by mouth every other day. (Patient taking differently: Take 20 mg by mouth 2 (two) times a week.) 30 tablet 3   metoprolol succinate (TOPROL-XL) 25 MG 24 hr tablet Take 0.5 tablets (12.5 mg total) by mouth daily. Take with or immediately following a meal. (Patient not taking: Reported on 01/16/2022) 45 tablet 3   No facility-administered medications prior to visit.    Medication list after today's encounter   Current Outpatient Medications  Medication Instructions   ACCU-CHEK GUIDE test strip USE 1 TEST STRIP TWICE DAILY   acetaminophen (TYLENOL) 1,000 mg, Oral, Every 6 hours PRN   aspirin 81 mg, Oral, Daily at bedtime   clopidogrel (PLAVIX) 75 mg, Oral, Daily   GLYXAMBI 25-5 MG TABS 1 tablet, Oral, Daily   lisinopril (ZESTRIL) 20 mg, Oral, 2 times daily   Magnesium 400 mg, Oral, Daily   metoprolol succinate (TOPROL-XL) 12.5 mg, Oral, Daily, Take with or immediately following a meal.   NOVOLIN 70/30 RELION (70-30) 100 UNIT/ML injection 25-30 Units, 2 times daily with meals, SLIDING Scale   omeprazole (PRILOSEC) 20 mg, Oral, Daily   POTASSIUM CHLORIDE PO 99 mg, Oral, Daily   RELION INSULIN SYRINGE 31G X 15/64" 0.5 ML MISC USE 1 THREE TIMES DAILY   rosuvastatin (CRESTOR) 10 mg, Oral, Every other day   Radiology:   No results found.  Cardiac Studies:  LOWER EXTREMITY ANGIOGRAPHY 01/01/2022:  Normal abdominal aorta, bilateral renals, iliacs Right: 80% Rt CFA calcific stenosis with 50 mmHg gradient 70% mid SFA, 90% popliteal artery calcific stenoses Two vessel below the knee runoff at ankle level (AT, PT) with very sluggish flow and severe diffuse disease Left: Left  popliteal artery flush occlusion with minimal reconstitution with collaterals One vessel below the knee runoff ankle level (PT) with severe diffuse disease  Will consider surgical referral for Rt femoral endarterectomy for  symptom improvement Continue medical management for LLE in absence of severe claudication symptoms/CLI  Peripheral arteriogram and shockwave atherectomy 01/08/2022 right lower extremity: Angioplasty  with shockwave atherectomy for the right common femoral artery, right proximal SFA, right mid SFA and right P2 segment of the popliteal artery.  Stenosis reduced from 80% to less than 30% in the CFA and proximal SFA with a 8.0 x 60 mm shockwave balloon.  Mid right SFA stenosis reduced to 10 to 15% with a 5.5 x 60 mm shockwave balloon, right popliteal artery P2 segment stenosis reduced to less than 30% with 5.5 x 60 mm shockwave balloon atherectomy.  Following atherectomy/shockwave therapy right common femoral artery pressure gradient was 20 mmHg.  Preprocedure it was 50 mmHg. Postprocedure brisk two-vessel runoff at the level of the ankle in the form of PT and peroneal vessel was evident, previously there was no runoff.  Recommendation: I expect significant improvement in symptoms of claudication.  Lifestyle modification was stressed to the patient.  We will follow him with outpatient ABI and Dopplers.  If he has recurrence of restenosis, could consider right femoral patch angioplasty. Uninterrupted dual antiplatelet therapy with Aspirin 21m daily and Clopidogrel 731mdaily for a minimum of 6 months   Lower Extremity Arterial Duplex 10/16/2021:  Moderate velocity increase at the right distal superficial femoral artery  suggests >50% stenosis.  No hemodynamically significant stenosis are  identified in the left lower extremity arterial system.  There is severe diffuse calcific plaque throughout the bilateral SFA and  EIA and CFA.  This exam reveals mildly decreased perfusion of the right lower extremity,  noted at the post tibial artery level (ABI 0.89) and moderately decreased  perfusion of the left lower extremity, noted at the post tibial artery  level (ABI 0.78 with severely abnormal monophasic  waveform pattern at the  bilateral ankles).  PCV MYOCARDIAL PERFUSION WO LEXISCAN 10/01/2021 Exercise nuclear stress test was performed using Bruce protocol. Patient reached 5.8 METS, and 87% of age predicted maximum heart rate. Exercise capacity was low. No chest pain reported. Heart rate and hemodynamic response were normal. Stress EKG revealed no ischemic changes. Mildly decreased tracer uptake in inferior myocardium likely due to diaphragmatic attenuation. Normal wall motion and thickening. Stress LVEF calculated 44%, but visually appears 50-55%. Low risk study.  Carotid artery duplex 08/16/2021:  Duplex suggests minimal stenosis in the right common carotid artery.    There is mild mixed plaque.  Duplex suggests stenosis in the right  external carotid artery (<50%).  Duplex suggests stenosis in the left common carotid artery (<50%) with  moderate mixed plaque.  Antegrade right vertebral artery flow. Antegrade left vertebral artery  flow.  Follow up in one year is appropriate if clinically indicated.  Coronary calcium score 08/16/2021: Left Main: 120 LAD: 967 LCx: 41 RCA: 394 Total Agatston Score: 1523 MESA database percentile: 97 Aortic atherosclerosis. No acute extra cardiac abnormality.  EKG:   08/01/2021: Normal sinus rhythm at rate of 81 bpm, normal axis, incomplete right bundle branch block.  Single PAC otherwise normal EKG.  Assessment     ICD-10-CM   1. Claudication in peripheral vascular disease (HCC)  I73.9 PCV LOWER ARTERIAL W ABI (UNILATERAL)    2. Bilateral carotid artery stenosis  I65.23     3. Hypercholesteremia  E78.00  PCV LOWER ARTERIAL W ABI (UNILATERAL)    4. Primary hypertension  I10        There are no discontinued medications.   No orders of the defined types were placed in this encounter.   No orders of the defined types were placed in this encounter.   Recommendations:   Shawn Daniels is a 66 y.o. caucasian male with a past medical  history significant for type 2 diabetes mellitus, hypertension, hyperlipidemia, GERD, and 40 pack year smoking history. He presents today by referral from Dr. Woody Seller for evaluation. 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   Patient underwent coronary calcium score revealed total score 1523 placing patient in the 97th percentile.  He subsequently underwent nuclear stress test which was overall low risk.  Patient has experienced worsening claudication, attempted medical therapy with aspirin, Plavix, as well as Pletal.  Unfortunately patient's symptoms continued, therefore recommended peripheral angiography.  Patient underwent lower extremity angiography at 01/01/2022 demonstrating calcified lesions in the right leg, recommended surgical evaluation.  However patient was resistant to surgery referral and instead opted to undergo high risk angioplasty.  Patient underwent peripheral arteriogram and shockwave arthrectomy of right lower extremity 01/08/2022, details below.  Patient was discharged with aspirin and Plavix for minimum of 6 months.  Patient now presents for follow up.  Patient's claudication has essentially resolved following angioplasty. Will obtain repeat ABI and Dopplers to establish new baseline.  Continue aspirin and Plavix for least 6 months (06/2022).  Patient's blood pressure is elevated at today's office visit, likely as he has been without metoprolol for the last 1 week.  Patient will resume metoprolol succinate 12.5 mg daily, blood pressure was previously well controlled on this regimen.  Patient will notify our office if blood pressure remains >130/80 mmHg.  Patient's lipids remain uncontrolled.  Given history of statin intolerance and continued elevated lipids in the context of PAD we will screen patient for PREVAIL-ASCVD clinical trial (obicetrapib) for additional lipid management.   Follow up in 6 months,  sooner if needed.    Alethia Berthold, PA-C 01/16/2022, 4:14 PM Office: [phone]

## 2022-03-08 ENCOUNTER — Telehealth (HOSPITAL_COMMUNITY): Payer: Self-pay | Admitting: Vascular Surgery

## 2022-03-08 NOTE — Telephone Encounter (Signed)
Pt is not appropriate for the HF clinic .. faxed to referring office ?

## 2022-03-12 ENCOUNTER — Telehealth (HOSPITAL_COMMUNITY): Payer: Self-pay | Admitting: Vascular Surgery

## 2022-03-12 NOTE — Telephone Encounter (Signed)
Pt is not appropriate for [facility] ?

## 2022-04-03 ENCOUNTER — Ambulatory Visit: Payer: Managed Care, Other (non HMO) | Admitting: Cardiovascular Disease

## 2022-04-03 ENCOUNTER — Encounter: Payer: Self-pay | Admitting: Cardiovascular Disease

## 2022-04-03 ENCOUNTER — Telehealth: Payer: Self-pay | Admitting: Internal Medicine

## 2022-04-03 VITALS — BP 132/76 | HR 88 | Ht 73.0 in | Wt 208.0 lb

## 2022-04-03 DIAGNOSIS — I739 Peripheral vascular disease, unspecified: Secondary | ICD-10-CM

## 2022-04-03 DIAGNOSIS — E782 Mixed hyperlipidemia: Secondary | ICD-10-CM | POA: Diagnosis not present

## 2022-04-03 DIAGNOSIS — E785 Hyperlipidemia, unspecified: Secondary | ICD-10-CM | POA: Insufficient documentation

## 2022-04-03 NOTE — Progress Notes (Signed)
?Cardiology Office Note:   ? ?Date:  04/03/2022  ? ?ID:  Shawn Daniels, DOB 1956/08/04, MRN [id number] ? ?PCP:  Shawn Chroman, MD ?  ?[facility] Providers ?Cardiologist:   Shawn Daniels  ?  ? ?Referring MD: Shawn Dawley, NP  ? ?Chief Complaint  ?Patient presents with  ? Claudication  ?  ? ?History of Present Illness:   ? ?Shawn Daniels is a 66 y.o. male with a hx of peripheral vascular disease, status post percutaneous angioplasty, coronary artery calcification ( coronary calcium score revealed total score 1523 placing patient in the 97th percentile.  ) ? ?Was previously see by [facility]  ?Had 2 separate PAD procedures but Dr. Nadyne Coombes was not able to place a stent . ? ?Had a mid R SFA stenosis, treated with andioplasty  - no stent was placed.  ?His symptoms returned within 3-4 weeks .  [place] and thought it was altitude. ?By his claudication symptoms did not improve with return to [employer] .  ? ? 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 ?No CP  ? ?He is frustrated at the return of his symptoms  ? ?Hx of DM since 1998 - type 2 , insulin dependent  ?Hx of HTN - on lisinopril  ?Hx of HLD - on crestor 20 mg twice a week ( limited dosing due to leg cramps )  ? ?No angina  ? ?LDL ? ?Past Medical History:  ?Diagnosis Date  ? Diabetes mellitus without complication ([place])   ? GERD (gastroesophageal reflux disease)   ? Hypertension   ? ? ?Past Surgical History:  ?Procedure Laterality Date  ? KNEE SURGERY    ? LOWER EXTREMITY ANGIOGRAPHY N/A 01/01/2022  ? Procedure: LOWER EXTREMITY ANGIOGRAPHY;  Surgeon: Nigel Mormon, MD;  Location: [facility];  Service: Cardiovascular;  Laterality: N/A;  ? PENILE PROSTHESIS IMPLANT    ? PERIPHERAL VASCULAR BALLOON ANGIOPLASTY  01/08/2022  ? Procedure: PERIPHERAL VASCULAR BALLOON ANGIOPLASTY;  Surgeon: Adrian Prows, MD;  Location: [facility];  Service:  Cardiovascular;;  ? WRIST SURGERY    ? ? ?Current Medications: ?Current Meds  ?Medication Sig  ? ACCU-CHEK GUIDE test strip USE 1 TEST STRIP TWICE DAILY  ? ASPIRIN 81 PO Take 81 mg by mouth at bedtime.  ? clopidogrel (PLAVIX) 75 MG tablet Take 1 tablet (75 mg total) by mouth daily.  ? GLYXAMBI 25-5 MG TABS Take 1 tablet by mouth daily.  ? lisinopril (PRINIVIL,ZESTRIL) 10 MG tablet Take 10 mg by mouth in the morning and at bedtime.  ? Magnesium 400 MG CAPS Take 400 mg by mouth daily. 400mg  in am 800mg   in pm  ? metoprolol succinate (TOPROL-XL) 25 MG 24 hr tablet Take 0.5 tablets (12.5 mg total) by mouth daily. Take with or immediately following a meal.  ? NOVOLIN 70/30 RELION (70-30) 100 UNIT/ML injection 25-30 Units 2 (two) times daily with a meal. SLIDING Scale  ? omeprazole (PRILOSEC) 20 MG capsule Take 20 mg by mouth daily.  ? POTASSIUM CHLORIDE PO Take 99 mg by mouth daily.  ? RELION INSULIN SYRINGE 31G X 15/64" 0.5 ML MISC USE 1 THREE TIMES DAILY  ? rosuvastatin (CRESTOR) 20 MG tablet Take 20 mg by mouth 2 (two) times a week.  ?  ? ?Allergies:   Codeine, Keflex [cephalexin], and Penicillins  ? ?Social History  ? ?Socioeconomic History  ?  Marital status: Married  ?  Spouse name: Not on file  ? Number of children: 0  ? Years of education: Not on file  ? Highest education level: Not on file  ?Occupational History  ? Not on file  ?Tobacco Use  ? Smoking status: Former  ?  Packs/day: 1.50  ?  Years: 30.00  ?  Pack years: 45.00  ?  Types: Cigarettes, Cigars  ?  Quit date: 2009  ?  Years since quitting: 14.2  ? Smokeless tobacco: Never  ?Vaping Use  ? Vaping Use: Never used  ?Substance and Sexual Activity  ? Alcohol use: Yes  ?  Alcohol/week: 3.0 standard drinks  ?  Types: 3 Cans of beer per week  ?  Comment: daily  ? Drug use: Never  ? Sexual activity: Not on file  ?Other Topics Concern  ? Not on file  ?Social History Narrative  ? Not on file  ? ?Social Determinants of Health  ? ?Financial Resource Strain: Not on  file  ?Food Insecurity: Not on file  ?Transportation Needs: Not on file  ?Physical Activity: Not on file  ?Stress: Not on file  ?Social Connections: Not on file  ?  ? ?Family History: ?The patient's family history includes Heart failure in his father. ? ?ROS:   ?Please see the history of present illness.    ? All other systems reviewed and are negative. ? ?EKGs/Labs/Other Studies Reviewed:   ? ?The following studies were reviewed today: ? ? ?EKG:    ? ?Recent Labs: ?12/26/2021: Platelets 154 ?01/08/2022: BUN 23; Creatinine, Ser 0.80; Hemoglobin 12.9; Potassium 4.7; Sodium 136  ?Recent Lipid Panel ?No results found for: CHOL, TRIG, HDL, CHOLHDL, VLDL, LDLCALC, LDLDIRECT ? ? ?Risk Assessment/Calculations:   ?  ? ?    ? ?Physical Exam:   ? ?VS:  BP 132/76 (BP Location: Right Arm, Patient Position: Sitting, Cuff Size: Normal)   Pulse 88   Ht 6\' 1"  (1.854 m)   Wt 208 lb (94.3 kg)   SpO2 95%   BMI 27.44 kg/m?    ? ?Wt Readings from Last 3 Encounters:  ?04/03/22 208 lb (94.3 kg)  ?01/16/22 208 lb (94.3 kg)  ?01/08/22 205 lb (93 kg)  ?  ? ?GEN:  Well nourished, well developed in no acute distress ?HEENT: Normal ?NECK: No JVD; No carotid bruits ?LYMPHATICS: No lymphadenopathy ?CARDIAC: RRR, no murmurs, rubs, gallops ?RESPIRATORY:  Clear to auscultation without rales, wheezing or rhonchi  ?ABDOMEN: Soft, non-tender, non-distended ?MUSCULOSKELETAL:  No edema; No deformity  ?SKIN: Warm and dry ?NEUROLOGIC:  Alert and oriented x 3 ?PSYCHIATRIC:  Normal affect  ? ?ASSESSMENT:   ? ?No diagnosis found. ?PLAN:   ? ?In order of problems listed above: ? ?Claudication: Patient has had 2 different peripheral vascular  procedures over the past month with [facility].  I have quickly reviewed his angiograms.  He has severe lower extremity disease.  Dr. Jacinto Halim was not able to place a stent.  I will refer him to vein and vascular specialist for further evaluation and management. ? ?2.  Hyperlipidemia: Patient has significant  hyperlipidemia.  He only tolerates rosuvastatin twice a week.  We will refer him to our lipid clinic for consideration for PCSK9 inhibitor or inclisiran. ? ?   ?Will have him see an APP in 6 months  ? ?   ? ? ?Medication Adjustments/Labs and Tests Ordered: ?Current medicines are reviewed at length with the patient today.  Concerns regarding medicines are outlined  above.  ?No orders of the defined types were placed in this encounter. ? ?No orders of the defined types were placed in this encounter. ? ? ?There are no Patient Instructions on file for this visit.  ? ?Signed, ?Mertie Moores, MD  ?04/03/2022 1:08 PM    ?[place] ? ?

## 2022-04-03 NOTE — Telephone Encounter (Signed)
Pt called in today to let us know that he has switched cardiologist and is now receiving care elsewhere   ?

## 2022-04-03 NOTE — Patient Instructions (Signed)
Medication Instructions:  ?Your physician recommends that you continue on your current medications as directed. Please refer to the Current Medication list given to you today. ? ?*If you need a refill on your cardiac medications before your next appointment, please call your pharmacy* ? ?Lab Work: ?NONE ?If you have labs (blood work) drawn today and your tests are completely normal, you will receive your results only by: ?MyChart Message (if you have MyChart) OR ?A paper copy in the mail ?If you have any lab test that is abnormal or we need to change your treatment, we will call you to review the results. ? ?Testing/Procedures: ?NONE ? ?Follow-Up: ?At [facility], you and your health needs are our priority.  As part of our continuing mission to provide you with exceptional heart care, we have created designated Provider Care Teams.  These Care Teams include your primary Cardiologist (physician) and Advanced Practice Providers (APPs -  Physician Assistants and Nurse Practitioners) who all work together to provide you with the care you need, when you need it. ? ?Your next appointment:   ?6 month(s) ? ?The format for your next appointment:   ?In Person ? ?Provider:   ?Robbie Lis, PA-C or Richardson Dopp, PA-C   ? ?Other Instructions ?Your physician has referred you to our [facility] for consideration of cholesterol-lowering medications. Your physician has also referred you to our Vein and Vascular specialist for further evaluation. Both of these offices will reach out to you to schedule an appointment with them. ? ?Important Information About Sugar ? ? ? ? ?  ?

## 2022-04-03 NOTE — Telephone Encounter (Signed)
Just an FYI, patient seeing [facility] now.

## 2022-04-10 ENCOUNTER — Other Ambulatory Visit: Payer: Self-pay

## 2022-04-10 DIAGNOSIS — I739 Peripheral vascular disease, unspecified: Secondary | ICD-10-CM

## 2022-04-15 ENCOUNTER — Encounter: Payer: Self-pay | Admitting: Surgery

## 2022-04-15 ENCOUNTER — Ambulatory Visit (HOSPITAL_COMMUNITY)
Admission: RE | Admit: 2022-04-15 | Discharge: 2022-04-15 | Disposition: A | Discharge status: Home / Self Care | Payer: Managed Care, Other (non HMO) | Source: Ambulatory Visit | Attending: Surgery | Admitting: Surgery

## 2022-04-15 ENCOUNTER — Ambulatory Visit: Payer: Managed Care, Other (non HMO) | Admitting: Surgery

## 2022-04-15 VITALS — BP 135/71 | HR 82 | Temp 98.2°F | Resp 20 | Ht 73.0 in | Wt 208.0 lb

## 2022-04-15 DIAGNOSIS — I70213 Atherosclerosis of native arteries of extremities with intermittent claudication, bilateral legs: Secondary | ICD-10-CM | POA: Diagnosis not present

## 2022-04-15 DIAGNOSIS — I739 Peripheral vascular disease, unspecified: Secondary | ICD-10-CM | POA: Insufficient documentation

## 2022-04-15 NOTE — H&P (View-Only) (Signed)
? ?[facility] [place] ? ?Patient name: Shawn Daniels MRN: [id number] DOB: 1956-05-04 Sex: male ? ? ?REQUESTING PROVIDER:  ? ? Dr. Acie Fredrickson ?Dr. Earnie Larsson ? ?REASON FOR CONSULT:  ?  ?claudication ? ?HISTORY OF PRESENT ILLNESS:  ? ?Shawn Daniels is a 66 y.o. male, who is referred for evaluation of claudication.  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  Earlier this year he underwent angiography with shockwave lithotripsy to the common femoral, superficial femoral, popliteal artery.  He had short-term relief.  He is back today for surgical evaluation. ? ?Patient has had a diagnostic arteriogram followed by angioplasty with shockwave atherectomy of the right common femoral, proximal SFA and mid SFA as well as the right P2 segment of the popliteal artery in January 2023 ? ?The patient is a insulin-dependent diabetic.  He is medically managed for hypertension.  He is being referred to the lipid clinic for cholesterol management as he has cramping with statins. ? ?PAST MEDICAL HISTORY  ? ? ?Past Medical History:  ?Diagnosis Date  ? Diabetes mellitus without complication ([place])   ? GERD (gastroesophageal reflux disease)   ? Hypertension   ? ? ? ?FAMILY HISTORY  ? ?Family History  ?Problem Relation Age of Onset  ? Heart failure Father   ? ? ?SOCIAL HISTORY:  ? ?Social History  ? ?Socioeconomic History  ? Marital status: Married  ?  Spouse name: Not on file  ? Number of children: 0  ? Years of education: Not on file  ? Highest education level: Not on file  ?Occupational History  ? Not on file  ?Tobacco Use  ? Smoking status: Former  ?  Packs/day: 1.50  ?  Years: 30.00  ?  Pack years: 45.00  ?  Types: Cigarettes, Cigars  ?  Quit date: 2009  ?  Years since quitting: 14.3  ? Smokeless tobacco: Never  ?Vaping Use  ? Vaping Use: Never used  ?Substance and Sexual Activity  ? Alcohol use: Yes  ?  Alcohol/week: 3.0 standard  drinks  ?  Types: 3 Cans of beer per week  ?  Comment: daily  ? Drug use: Never  ? Sexual activity: Not on file  ?Other Topics Concern  ? Not on file  ?Social History Narrative  ? Not on file  ? ?Social Determinants of Health  ? ?Financial Resource Strain: Not on file  ?Food Insecurity: Not on file  ?Transportation Needs: Not on file  ?Physical Activity: Not on file  ?Stress: Not on file  ?Social Connections: Not on file  ?Intimate Partner Violence: Not on file  ? ? ?ALLERGIES:  ? ? ?Allergies  ?Allergen Reactions  ? Codeine Itching  ?  Can take in lower doses  ? Keflex [Cephalexin] Other (See Comments)  ?  Looked older  ? Penicillins Other (See Comments)  ?  Childhood  ? ? ?CURRENT MEDICATIONS:  ? ? ?Current Outpatient Medications  ?Medication Sig Dispense Refill  ? ACCU-CHEK GUIDE test strip USE 1 TEST STRIP TWICE DAILY    ? ASPIRIN 81 PO Take 81 mg by mouth at bedtime.    ? clopidogrel (PLAVIX) 75 MG tablet Take 1 tablet (75 mg total) by mouth daily. 90 tablet 1  ? GLYXAMBI 25-5 MG TABS Take 1 tablet by mouth daily.    ? lisinopril (PRINIVIL,ZESTRIL) 10 MG tablet Take 10 mg by mouth in the morning and at bedtime.    ?  Magnesium 400 MG CAPS Take 400 mg by mouth daily. 400mg  in am 800mg   in pm    ? metoprolol succinate (TOPROL-XL) 25 MG 24 hr tablet Take 0.5 tablets (12.5 mg total) by mouth daily. Take with or immediately following a meal. 45 tablet 3  ? NOVOLIN 70/30 RELION (70-30) 100 UNIT/ML injection 25-30 Units 2 (two) times daily with a meal. SLIDING Scale    ? omeprazole (PRILOSEC) 20 MG capsule Take 20 mg by mouth daily.    ? POTASSIUM CHLORIDE PO Take 99 mg by mouth daily.    ? RELION INSULIN SYRINGE 31G X 15/64" 0.5 ML MISC USE 1 THREE TIMES DAILY    ? rosuvastatin (CRESTOR) 20 MG tablet Take 20 mg by mouth 2 (two) times a week.    ? acetaminophen (TYLENOL) 500 MG tablet Take 1,000 mg by mouth every 6 (six) hours as needed for headache. (Patient not taking: Reported on 04/03/2022)    ? ?No current  facility-administered medications for this visit.  ? ? ?REVIEW OF SYSTEMS:  ? ?[X]  denotes positive finding, [ ]  denotes negative finding ?Cardiac  Comments:  ?Chest pain or chest pressure:    ?Shortness of breath upon exertion:    ?Short of breath when lying flat:    ?Irregular heart rhythm:    ?    ?Vascular    ?Pain in calf, thigh, or hip brought on by ambulation: x   ?Pain in feet at night that wakes you up from your sleep:     ?Blood clot in your veins:    ?Leg swelling:     ?    ?Pulmonary    ?Oxygen at home:    ?Productive cough:     ?Wheezing:     ?    ?Neurologic    ?Sudden weakness in arms or legs:     ?Sudden numbness in arms or legs:     ?Sudden onset of difficulty speaking or slurred speech:    ?Temporary loss of vision in one eye:     ?Problems with dizziness:     ?    ?Gastrointestinal    ?Blood in stool:     ? ?Vomited blood:     ?    ?Genitourinary    ?Burning when urinating:     ?Blood in urine:    ?    ?Psychiatric    ?Major depression:     ?    ?Hematologic    ?Bleeding problems:    ?Problems with blood clotting too easily:    ?    ?Skin    ?Rashes or ulcers:    ?    ?Constitutional    ?Fever or chills:    ? ?PHYSICAL EXAM:  ? ?There were no vitals filed for this visit. ? ?GENERAL: The patient is a well-nourished male, in no acute distress. The vital signs are documented above. ?CARDIAC: There is a regular rate and rhythm.  ?VASCULAR: Nonpalpable pedal pulses ?PULMONARY: Nonlabored respirations ?MUSCULOSKELETAL: There are no major deformities or cyanosis. ?NEUROLOGIC: No focal weakness or paresthesias are detected. ?SKIN: There are no ulcers or rashes noted. ?PSYCHIATRIC: The patient has a normal affect. ? ?STUDIES:  ? ?I have reviewed his arteriogram which shows a calcified stenotic right common femoral artery.  He also has a high-grade right popliteal stenosis behind the knee.  There is diffuse tibial disease.  On the left, there is a high-grade popliteal stenosis behind the  knee. ? ?+-------+-----------+-----------+------------+------------+  ?ABI/TBIToday's ABIToday's TBIPrevious ABIPrevious  TBI  ?+-------+-----------+-----------+------------+------------+  ?Right  0.74       0.38       0.76                      ?+-------+-----------+-----------+------------+------------+  ?Left   0.73       0.46       0.69                      ?+-------+-----------+-----------+------------+------------+  ? ?ASSESSMENT and PLAN  ? ?Bilateral claudication, right greater than left: The patient has had return of the symptoms.  After reviewing his angiogram, he has heavy calcification with stenosis in the common femoral artery, a short segment lesion in the superficial femoral artery and then significant disease in the popliteal artery above the joint space.  I discussed with him that I would recommend first starting with a right femoral endarterectomy with patch angioplasty.  I expect this to give him symptomatic relief.  He understands that he may still have some limitations and that we may need to repeat treatment of his popliteal artery, this time with stenting.  He wants to discuss this with his wife but is eager to get this done.  He will need to be off of his Plavix for 5 days prior to surgery. ? ? ?Annamarie Major, IV, MD, FACS ?[facility] [place] ?Tel [phone] ?Pager [phone]  ?

## 2022-04-15 NOTE — Progress Notes (Signed)
? ?[facility] [place] ? ?Patient name: Shawn Daniels MRN: [id number] DOB: 10-Jul-1956 Sex: male ? ? ?REQUESTING PROVIDER:  ? ? Dr. Acie Fredrickson ?Dr. Earnie Larsson ? ?REASON FOR CONSULT:  ?  ?claudication ? ?HISTORY OF PRESENT ILLNESS:  ? ?Shawn Daniels is a 66 y.o. male, who is referred for evaluation of claudication.  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  Earlier this year he underwent angiography with shockwave lithotripsy to the common femoral, superficial femoral, popliteal artery.  He had short-term relief.  He is back today for surgical evaluation. ? ?Patient has had a diagnostic arteriogram followed by angioplasty with shockwave atherectomy of the right common femoral, proximal SFA and mid SFA as well as the right P2 segment of the popliteal artery in January 2023 ? ?The patient is a insulin-dependent diabetic.  He is medically managed for hypertension.  He is being referred to the lipid clinic for cholesterol management as he has cramping with statins. ? ?PAST MEDICAL HISTORY  ? ? ?Past Medical History:  ?Diagnosis Date  ? Diabetes mellitus without complication ([place])   ? GERD (gastroesophageal reflux disease)   ? Hypertension   ? ? ? ?FAMILY HISTORY  ? ?Family History  ?Problem Relation Age of Onset  ? Heart failure Father   ? ? ?SOCIAL HISTORY:  ? ?Social History  ? ?Socioeconomic History  ? Marital status: Married  ?  Spouse name: Not on file  ? Number of children: 0  ? Years of education: Not on file  ? Highest education level: Not on file  ?Occupational History  ? Not on file  ?Tobacco Use  ? Smoking status: Former  ?  Packs/day: 1.50  ?  Years: 30.00  ?  Pack years: 45.00  ?  Types: Cigarettes, Cigars  ?  Quit date: 2009  ?  Years since quitting: 14.3  ? Smokeless tobacco: Never  ?Vaping Use  ? Vaping Use: Never used  ?Substance and Sexual Activity  ? Alcohol use: Yes  ?  Alcohol/week: 3.0 standard  drinks  ?  Types: 3 Cans of beer per week  ?  Comment: daily  ? Drug use: Never  ? Sexual activity: Not on file  ?Other Topics Concern  ? Not on file  ?Social History Narrative  ? Not on file  ? ?Social Determinants of Health  ? ?Financial Resource Strain: Not on file  ?Food Insecurity: Not on file  ?Transportation Needs: Not on file  ?Physical Activity: Not on file  ?Stress: Not on file  ?Social Connections: Not on file  ?Intimate Partner Violence: Not on file  ? ? ?ALLERGIES:  ? ? ?Allergies  ?Allergen Reactions  ? Codeine Itching  ?  Can take in lower doses  ? Keflex [Cephalexin] Other (See Comments)  ?  Looked older  ? Penicillins Other (See Comments)  ?  Childhood  ? ? ?CURRENT MEDICATIONS:  ? ? ?Current Outpatient Medications  ?Medication Sig Dispense Refill  ? ACCU-CHEK GUIDE test strip USE 1 TEST STRIP TWICE DAILY    ? ASPIRIN 81 PO Take 81 mg by mouth at bedtime.    ? clopidogrel (PLAVIX) 75 MG tablet Take 1 tablet (75 mg total) by mouth daily. 90 tablet 1  ? GLYXAMBI 25-5 MG TABS Take 1 tablet by mouth daily.    ? lisinopril (PRINIVIL,ZESTRIL) 10 MG tablet Take 10 mg by mouth in the morning and at bedtime.    ?  Magnesium 400 MG CAPS Take 400 mg by mouth daily. 400mg  in am 800mg   in pm    ? metoprolol succinate (TOPROL-XL) 25 MG 24 hr tablet Take 0.5 tablets (12.5 mg total) by mouth daily. Take with or immediately following a meal. 45 tablet 3  ? NOVOLIN 70/30 RELION (70-30) 100 UNIT/ML injection 25-30 Units 2 (two) times daily with a meal. SLIDING Scale    ? omeprazole (PRILOSEC) 20 MG capsule Take 20 mg by mouth daily.    ? POTASSIUM CHLORIDE PO Take 99 mg by mouth daily.    ? RELION INSULIN SYRINGE 31G X 15/64" 0.5 ML MISC USE 1 THREE TIMES DAILY    ? rosuvastatin (CRESTOR) 20 MG tablet Take 20 mg by mouth 2 (two) times a week.    ? acetaminophen (TYLENOL) 500 MG tablet Take 1,000 mg by mouth every 6 (six) hours as needed for headache. (Patient not taking: Reported on 04/03/2022)    ? ?No current  facility-administered medications for this visit.  ? ? ?REVIEW OF SYSTEMS:  ? ?[X]  denotes positive finding, [ ]  denotes negative finding ?Cardiac  Comments:  ?Chest pain or chest pressure:    ?Shortness of breath upon exertion:    ?Short of breath when lying flat:    ?Irregular heart rhythm:    ?    ?Vascular    ?Pain in calf, thigh, or hip brought on by ambulation: x   ?Pain in feet at night that wakes you up from your sleep:     ?Blood clot in your veins:    ?Leg swelling:     ?    ?Pulmonary    ?Oxygen at home:    ?Productive cough:     ?Wheezing:     ?    ?Neurologic    ?Sudden weakness in arms or legs:     ?Sudden numbness in arms or legs:     ?Sudden onset of difficulty speaking or slurred speech:    ?Temporary loss of vision in one eye:     ?Problems with dizziness:     ?    ?Gastrointestinal    ?Blood in stool:     ? ?Vomited blood:     ?    ?Genitourinary    ?Burning when urinating:     ?Blood in urine:    ?    ?Psychiatric    ?Major depression:     ?    ?Hematologic    ?Bleeding problems:    ?Problems with blood clotting too easily:    ?    ?Skin    ?Rashes or ulcers:    ?    ?Constitutional    ?Fever or chills:    ? ?PHYSICAL EXAM:  ? ?There were no vitals filed for this visit. ? ?GENERAL: The patient is a well-nourished male, in no acute distress. The vital signs are documented above. ?CARDIAC: There is a regular rate and rhythm.  ?VASCULAR: Nonpalpable pedal pulses ?PULMONARY: Nonlabored respirations ?MUSCULOSKELETAL: There are no major deformities or cyanosis. ?NEUROLOGIC: No focal weakness or paresthesias are detected. ?SKIN: There are no ulcers or rashes noted. ?PSYCHIATRIC: The patient has a normal affect. ? ?STUDIES:  ? ?I have reviewed his arteriogram which shows a calcified stenotic right common femoral artery.  He also has a high-grade right popliteal stenosis behind the knee.  There is diffuse tibial disease.  On the left, there is a high-grade popliteal stenosis behind the  knee. ? ?+-------+-----------+-----------+------------+------------+  ?ABI/TBIToday's ABIToday's TBIPrevious ABIPrevious  TBI  ?+-------+-----------+-----------+------------+------------+  ?Right  0.74       0.38       0.76                      ?+-------+-----------+-----------+------------+------------+  ?Left   0.73       0.46       0.69                      ?+-------+-----------+-----------+------------+------------+  ? ?ASSESSMENT and PLAN  ? ?Bilateral claudication, right greater than left: The patient has had return of the symptoms.  After reviewing his angiogram, he has heavy calcification with stenosis in the common femoral artery, a short segment lesion in the superficial femoral artery and then significant disease in the popliteal artery above the joint space.  I discussed with him that I would recommend first starting with a right femoral endarterectomy with patch angioplasty.  I expect this to give him symptomatic relief.  He understands that he may still have some limitations and that we may need to repeat treatment of his popliteal artery, this time with stenting.  He wants to discuss this with his wife but is eager to get this done.  He will need to be off of his Plavix for 5 days prior to surgery. ? ? ?Annamarie Major, IV, MD, FACS ?[facility] [place] ?Tel [phone] ?Pager [phone]  ?

## 2022-04-17 ENCOUNTER — Other Ambulatory Visit: Payer: Self-pay

## 2022-04-17 DIAGNOSIS — I70213 Atherosclerosis of native arteries of extremities with intermittent claudication, bilateral legs: Secondary | ICD-10-CM

## 2022-04-22 NOTE — Pre-Procedure Instructions (Signed)
Surgical Instructions ? ? ? Your procedure is scheduled on Friday, May 5th. ? [facility] "A" at 05:30 A.M., then check in with the Admitting office. ? Call this number if you have problems the morning of surgery: ? [phone] ? ? If you have any questions prior to your surgery date call [phone]: Open Monday-Friday 8am-4pm ? ? ? Remember: ? Do not eat or drink after midnight the night before your surgery ? ?  ? Take these medicines the morning of surgery with A SIP OF WATER  ?metoprolol succinate (TOPROL-XL)  ?omeprazole (PRILOSEC) ?rosuvastatin (CRESTOR) ? ? ?Hold Plavix for 5 days prior to surgery. ? ?Follow your surgeon's instructions on when to stop Aspirin.  If no instructions were given by your surgeon then you will need to call the office to get those instructions.    ? ?As of today, STOP taking any Aspirin (unless otherwise instructed by your surgeon) Aleve, Naproxen, Ibuprofen, Motrin, Advil, Goody's, BC's, all herbal medications, fish oil, and all vitamins. ? ? ?WHAT DO I DO ABOUT MY DIABETES MEDICATION? ? ? ?Do not take GLYXAMBI the day before surgery 5/4 or the morning of surgery 5/5. ? ?THE NIGHT BEFORE SURGERY, take 17.5-21 units (70%) of NOVOLIN 70/30.     ? ? ?THE MORNING OF SURGERY, do not take NOVOLIN 70/30. ? ? ?If your CBG is greater than 220 mg/dL, you may take ? of your sliding scale (correction) dose of insulin. ? ? ?HOW TO MANAGE YOUR DIABETES ?BEFORE AND AFTER SURGERY ? ?Why is it important to control my blood sugar before and after surgery? ?Improving blood sugar levels before and after surgery helps healing and can limit problems. ?A way of improving blood sugar control is eating a healthy diet by: ? Eating less sugar and carbohydrates ? Increasing activity/exercise ? Talking with your doctor about reaching your blood sugar goals ?High blood sugars (greater than 180 mg/dL) can raise your risk of infections and slow your recovery, so you will need to focus on  controlling your diabetes during the weeks before surgery. ?Make sure that the doctor who takes care of your diabetes knows about your planned surgery including the date and location. ? ?How do I manage my blood sugar before surgery? ?Check your blood sugar at least 4 times a day, starting 2 days before surgery, to make sure that the level is not too high or low. ? ?Check your blood sugar the morning of your surgery when you wake up and every 2 hours until you get to the Short Stay unit. ? ?If your blood sugar is less than 70 mg/dL, you will need to treat for low blood sugar: ?Do not take insulin. ?Treat a low blood sugar (less than 70 mg/dL) with ? cup of clear juice (cranberry or apple), 4 glucose tablets, OR glucose gel. ?Recheck blood sugar in 15 minutes after treatment (to make sure it is greater than 70 mg/dL). If your blood sugar is not greater than 70 mg/dL on recheck, call [phone] for further instructions. ?Report your blood sugar to the short stay nurse when you get to Short Stay. ? ?If you are admitted to the hospital after surgery: ?Your blood sugar will be checked by the staff and you will probably be given insulin after surgery (instead of oral diabetes medicines) to make sure you have good blood sugar levels. ?The goal for blood sugar control after surgery is 80-180 mg/dL.         ?           ?  Do NOT Smoke (Tobacco/Vaping) for 24 hours prior to your procedure. ? ?If you use a CPAP at night, you may bring your mask/headgear for your overnight stay. ?  ?Contacts, glasses, piercing's, hearing aid's, dentures or partials may not be worn into surgery, please bring cases for these belongings.  ?  ?For patients admitted to the hospital, discharge time will be determined by your treatment team. ?  ?Patients discharged the day of surgery will not be allowed to drive home, and someone needs to stay with them for 24 hours. ? ?SURGICAL WAITING ROOM VISITATION ?Patients having surgery or a procedure may have  two support people in the waiting room. These visitors may be switched out with other visitors if needed. ?Children under the age of 39 must have an adult accompany them who is not the patient. ?If the patient needs to stay at the hospital during part of their recovery, the visitor guidelines for inpatient rooms apply. ? ?Please refer to the [facility] website for the visitor guidelines for Inpatients (after your surgery is over and you are in a regular room).  ? ? ?Special instructions:   ?Agency- Preparing For Surgery ? ?Before surgery, you can play an important role. Because skin is not sterile, your skin needs to be as free of germs as possible. You can reduce the number of germs on your skin by washing with CHG (chlorahexidine gluconate) Soap before surgery.  CHG is an antiseptic cleaner which kills germs and bonds with the skin to continue killing germs even after washing.   ? ?Oral Hygiene is also important to reduce your risk of infection.  Remember - BRUSH YOUR TEETH THE MORNING OF SURGERY WITH YOUR REGULAR TOOTHPASTE ? ?Please do not use if you have an allergy to CHG or antibacterial soaps. If your skin becomes reddened/irritated stop using the CHG.  ?Do not shave (including legs and underarms) for at least 48 hours prior to first CHG shower. It is OK to shave your face. ? ?Please follow these instructions carefully. ?  ?Shower the NIGHT BEFORE SURGERY and the MORNING OF SURGERY ? ?If you chose to wash your hair, wash your hair first as usual with your normal shampoo. ? ?After you shampoo, rinse your hair and body thoroughly to remove the shampoo. ? ?Use CHG Soap as you would any other liquid soap. You can apply CHG directly to the skin and wash gently with a scrungie or a clean washcloth.  ? ?Apply the CHG Soap to your body ONLY FROM THE NECK DOWN.  Do not use on open wounds or open sores. Avoid contact with your eyes, ears, mouth and genitals (private parts). Wash Face and genitals (private parts)   with your normal soap.  ? ?Wash thoroughly, paying special attention to the area where your surgery will be performed. ? ?Thoroughly rinse your body with warm water from the neck down. ? ?DO NOT shower/wash with your normal soap after using and rinsing off the CHG Soap. ? ?Pat yourself dry with a CLEAN TOWEL. ? ?Wear CLEAN PAJAMAS to bed the night before surgery ? ?Place CLEAN SHEETS on your bed the night before your surgery ? ?DO NOT SLEEP WITH PETS. ? ? ?Day of Surgery: ?Take a shower with CHG soap. ?Do not wear jewelry  ?Do not wear lotions, powders, colognes, or deodorant. ?Do not shave 48 hours prior to surgery.  Men may shave face and neck. ?Do not bring valuables to the hospital.  ?[facility] is not responsible  for any belongings or valuables. ? ?Wear Clean/Comfortable clothing the morning of surgery ?Remember to brush your teeth WITH YOUR REGULAR TOOTHPASTE. ?  ?Please read over the following fact sheets that you were given. ? ? ? ?If you received a COVID test during your pre-op visit  it is requested that you wear a mask when out in public, stay away from anyone that may not be feeling well and notify your surgeon if you develop symptoms. If you have been in contact with anyone that has tested positive in the last 10 days please notify you surgeon.  ?

## 2022-04-23 ENCOUNTER — Encounter (HOSPITAL_COMMUNITY)
Admission: RE | Admit: 2022-04-23 | Discharge: 2022-04-23 | Disposition: A | Discharge status: Home / Self Care | Payer: Managed Care, Other (non HMO) | Source: Ambulatory Visit | Attending: Surgery | Admitting: Surgery

## 2022-04-23 ENCOUNTER — Encounter (HOSPITAL_COMMUNITY): Payer: Self-pay

## 2022-04-23 ENCOUNTER — Other Ambulatory Visit: Payer: Self-pay

## 2022-04-23 VITALS — BP 151/74 | HR 80 | Temp 97.7°F | Resp 18 | Ht 73.0 in | Wt 208.0 lb

## 2022-04-23 DIAGNOSIS — Z01818 Encounter for other preprocedural examination: Secondary | ICD-10-CM

## 2022-04-23 DIAGNOSIS — Z01812 Encounter for preprocedural laboratory examination: Secondary | ICD-10-CM | POA: Insufficient documentation

## 2022-04-23 DIAGNOSIS — Z7901 Long term (current) use of anticoagulants: Secondary | ICD-10-CM | POA: Insufficient documentation

## 2022-04-23 DIAGNOSIS — I70213 Atherosclerosis of native arteries of extremities with intermittent claudication, bilateral legs: Secondary | ICD-10-CM | POA: Insufficient documentation

## 2022-04-23 HISTORY — DX: Peripheral vascular disease, unspecified: I73.9

## 2022-04-23 LAB — COMPREHENSIVE METABOLIC PANEL
ALT: 27 U/L (ref 0–44)
AST: 35 U/L (ref 15–41)
Albumin: 4.3 g/dL (ref 3.5–5.0)
Alkaline Phosphatase: 59 U/L (ref 38–126)
Anion gap: 8 (ref 5–15)
BUN: 15 mg/dL (ref 8–23)
CO2: 27 mmol/L (ref 22–32)
Calcium: 9.8 mg/dL (ref 8.9–10.3)
Chloride: 103 mmol/L (ref 98–111)
Creatinine, Ser: 0.89 mg/dL (ref 0.61–1.24)
GFR, Estimated: >60 mL/min (ref >60)
Glucose, Bld: 85 mg/dL (ref 70–99)
Potassium: 3.9 mmol/L (ref 3.5–5.1)
Sodium: 138 mmol/L (ref 135–145)
Total Bilirubin: 0.9 mg/dL (ref 0.3–1.2)
Total Protein: 7.5 g/dL (ref 6.5–8.1)

## 2022-04-23 LAB — CBC
HCT: 41.2 % (ref 39.0–52.0)
Hemoglobin: 13.6 g/dL (ref 13.0–17.0)
MCH: 32 pg (ref 26.0–34.0)
MCHC: 33 g/dL (ref 30.0–36.0)
MCV: 96.9 fL (ref 80.0–100.0)
Platelets: 145 10*3/uL — ABNORMAL LOW (ref 150–400)
RBC: 4.25 MIL/uL (ref 4.22–5.81)
RDW: 12.3 % (ref 11.5–15.5)
WBC: 6.3 10*3/uL (ref 4.0–10.5)
nRBC: 0 % (ref 0.0–0.2)

## 2022-04-23 LAB — HEMOGLOBIN A1C
Hgb A1c MFr Bld: 7 % — ABNORMAL HIGH (ref 4.8–5.6)
Mean Plasma Glucose: 154.2 mg/dL

## 2022-04-23 LAB — TYPE AND SCREEN
ABO/RH(D): A POS
Antibody Screen: NEGATIVE

## 2022-04-23 LAB — URINALYSIS, ROUTINE W REFLEX MICROSCOPIC
Bacteria, UA: NONE SEEN
Bilirubin Urine: NEGATIVE
Glucose, UA: >=500 mg/dL — AB
Hgb urine dipstick: NEGATIVE
Ketones, ur: 5 mg/dL — AB
Leukocytes,Ua: NEGATIVE
Nitrite: NEGATIVE
Protein, ur: NEGATIVE mg/dL
Specific Gravity, Urine: 1.027 (ref 1.005–1.030)
pH: 7 (ref 5.0–8.0)

## 2022-04-23 LAB — SURGICAL PCR SCREEN
MRSA, PCR: NEGATIVE
Staphylococcus aureus: POSITIVE — AB

## 2022-04-23 LAB — PROTIME-INR
INR: 1 (ref 0.8–1.2)
Prothrombin Time: 12.6 seconds (ref 11.4–15.2)

## 2022-04-23 LAB — GLUCOSE, CAPILLARY: Glucose-Capillary: 137 mg/dL — ABNORMAL HIGH (ref 70–99)

## 2022-04-23 LAB — APTT: aPTT: 27 seconds (ref 24–36)

## 2022-04-23 NOTE — Progress Notes (Signed)
Abnormal labs in PAT: Urinalysis: Glucose, UA > 500; ketones, ur 5. Dr. Trula Slade office was called but was after 4 PM and nobody was available. This writer left a message to call back PAT.  ?

## 2022-04-23 NOTE — Progress Notes (Addendum)
PCP - Jerene Bears, MD ?Cardiologist - Mertie Moores, MD ? ?PPM/ICD - denies ?Device Orders - n/a ?Rep Notified - n/a ? ?Chest x-ray - n/a ?EKG - 08/01/2021 ?Stress Test - 10/01/2021 ?ECHO - denies ?Cardiac Cath - denies ? ?Sleep Study - denies ?CPAP - n/a ? ?Fasting Blood Sugar - 150 - 180 ?Checks Blood Sugar - 3 times a day ?CBG today - 137 ?A1C - done in PAT on 04/23/2022 ? ?Blood Thinner Instructions: Plavix - last dose - 04/21/2022 ? ?Aspirin Instructions: patient will not hold Aspirin prior surgery ? ?Patient was instructed: As of today, STOP taking any Aspirin (unless otherwise instructed by your surgeon) Aleve, Naproxen, Ibuprofen, Motrin, Advil, Goody's, BC's, all herbal medications, fish oil, and all vitamins. ? ?ERAS Protcol - n/a  ? ?COVID TEST- n/a ? ? ?Anesthesia review: yes - abnormal labs in PAT. Dr. Trula Slade was notified ? ?Patient denies shortness of breath, fever, cough and chest pain at PAT appointment ? ? ?All instructions explained to the patient, with a verbal understanding of the material. Patient agrees to go over the instructions while at home for a better understanding. Patient also instructed to self quarantine after being tested for COVID-19. The opportunity to ask questions was provided. ?  ?

## 2022-04-23 NOTE — Pre-Procedure Instructions (Signed)
Surgical Instructions ? ? ? Your procedure is scheduled on Friday, May 5th, 2023 ? ? Report to [facility] Main Entrance "A" at 05:30 A.M., then check in with the Admitting office. ? Call this number if you have problems the morning of surgery: ? [phone] ? ? If you have any questions prior to your surgery date call [phone]: Open Monday-Friday 8am-4pm ? ? ? Remember: ? Do not eat or drink after midnight the night before your surgery ? ? Take these medicines the morning of surgery with A SIP OF WATER  ? ?metoprolol succinate (TOPROL-XL)  ?omeprazole (PRILOSEC) ?rosuvastatin (CRESTOR) ? ? ?Hold Plavix for 5 days prior to surgery. ? ?Follow your surgeon's instructions on when to stop Aspirin.  If no instructions were given by your surgeon then you will need to call the office to get those instructions.    ? ?As of today, STOP taking any Aspirin (unless otherwise instructed by your surgeon) Aleve, Naproxen, Ibuprofen, Motrin, Advil, Goody's, BC's, all herbal medications, fish oil, and all vitamins. ? ? ?WHAT DO I DO ABOUT MY DIABETES MEDICATION? ? ? ?Do not take GLYXAMBI the day before surgery 5/4 or the morning of surgery 5/5. ? ?THE NIGHT BEFORE SURGERY, take 17- 21 units (70%) of your regular dose of NOVOLIN 70/30.     ? ? ?THE MORNING OF SURGERY, do not take NOVOLIN 70/30. ? ? ?If your CBG is greater than 220 mg/dL, you may take ? of your sliding scale (correction) dose of insulin. ? ? ?HOW TO MANAGE YOUR DIABETES ?BEFORE AND AFTER SURGERY ? ?Why is it important to control my blood sugar before and after surgery? ?Improving blood sugar levels before and after surgery helps healing and can limit problems. ?A way of improving blood sugar control is eating a healthy diet by: ? Eating less sugar and carbohydrates ? Increasing activity/exercise ? Talking with your doctor about reaching your blood sugar goals ?High blood sugars (greater than 180 mg/dL) can raise your risk of infections and slow your recovery, so  you will need to focus on controlling your diabetes during the weeks before surgery. ?Make sure that the doctor who takes care of your diabetes knows about your planned surgery including the date and location. ? ?How do I manage my blood sugar before surgery? ?Check your blood sugar at least 4 times a day, starting 2 days before surgery, to make sure that the level is not too high or low. ? ?Check your blood sugar the morning of your surgery when you wake up and every 2 hours until you get to the Short Stay unit. ? ?If your blood sugar is less than 70 mg/dL, you will need to treat for low blood sugar: ?Do not take insulin. ?Treat a low blood sugar (less than 70 mg/dL) with ? cup of clear juice (cranberry or apple), 4 glucose tablets, OR glucose gel. ?Recheck blood sugar in 15 minutes after treatment (to make sure it is greater than 70 mg/dL). If your blood sugar is not greater than 70 mg/dL on recheck, call [phone] for further instructions. ?Report your blood sugar to the short stay nurse when you get to Short Stay. ? ?If you are admitted to the hospital after surgery: ?Your blood sugar will be checked by the staff and you will probably be given insulin after surgery (instead of oral diabetes medicines) to make sure you have good blood sugar levels. ?The goal for blood sugar control after surgery is 80-180 mg/dL.         ?           ?  Do NOT Smoke (Tobacco/Vaping) for 24 hours prior to your procedure. ? ?If you use a CPAP at night, you may bring your mask/headgear for your overnight stay. ?  ?Contacts, glasses, piercing's, hearing aid's, dentures or partials may not be worn into surgery, please bring cases for these belongings.  ?  ?For patients admitted to the hospital, discharge time will be determined by your treatment team. ?  ?Patients discharged the day of surgery will not be allowed to drive home, and someone needs to stay with them for 24 hours. ? ?SURGICAL WAITING ROOM VISITATION ?Patients having  surgery or a procedure may have two support people in the waiting room. These visitors may be switched out with other visitors if needed. ?Children under the age of 65 must have an adult accompany them who is not the patient. ?If the patient needs to stay at the hospital during part of their recovery, the visitor guidelines for inpatient rooms apply. ? ?Please refer to the [place] website for the visitor guidelines for Inpatients (after your surgery is over and you are in a regular room).  ? ? ?Special instructions:   ?[facility]- Preparing For Surgery ? ?Before surgery, you can play an important role. Because skin is not sterile, your skin needs to be as free of germs as possible. You can reduce the number of germs on your skin by washing with CHG (chlorahexidine gluconate) Soap before surgery.  CHG is an antiseptic cleaner which kills germs and bonds with the skin to continue killing germs even after washing.   ? ?Oral Hygiene is also important to reduce your risk of infection.  Remember - BRUSH YOUR TEETH THE MORNING OF SURGERY WITH YOUR REGULAR TOOTHPASTE ? ?Please do not use if you have an allergy to CHG or antibacterial soaps. If your skin becomes reddened/irritated stop using the CHG.  ?Do not shave (including legs and underarms) for at least 48 hours prior to first CHG shower. It is OK to shave your face. ? ?Please follow these instructions carefully. ?  ?Shower the NIGHT BEFORE SURGERY and the MORNING OF SURGERY ? ?If you chose to wash your hair, wash your hair first as usual with your normal shampoo. ? ?After you shampoo, rinse your hair and body thoroughly to remove the shampoo. ? ?Use CHG Soap as you would any other liquid soap. You can apply CHG directly to the skin and wash gently with a scrungie or a clean washcloth.  ? ?Apply the CHG Soap to your body ONLY FROM THE NECK DOWN.  Do not use on open wounds or open sores. Avoid contact with your eyes, ears, mouth and genitals (private parts). Wash  Face and genitals (private parts)  with your normal soap.  ? ?Wash thoroughly, paying special attention to the area where your surgery will be performed. ? ?Thoroughly rinse your body with warm water from the neck down. ? ?DO NOT shower/wash with your normal soap after using and rinsing off the CHG Soap. ? ?Pat yourself dry with a CLEAN TOWEL. ? ?Wear CLEAN PAJAMAS to bed the night before surgery ? ?Place CLEAN SHEETS on your bed the night before your surgery ? ?DO NOT SLEEP WITH PETS. ? ? ?Day of Surgery: ?Take a shower with CHG soap. ?Do not wear jewelry  ?Do not wear lotions, powders, colognes, or deodorant. ?Do not shave 48 hours prior to surgery.  Men may shave face and neck. ?Do not bring valuables to the hospital.  ?[facility] is not responsible  for any belongings or valuables. ? ?Wear Clean/Comfortable clothing the morning of surgery ?Remember to brush your teeth WITH YOUR REGULAR TOOTHPASTE. ?  ?Please read over the following fact sheets that you were given. ? ? ? ?If you received a COVID test during your pre-op visit  it is requested that you wear a mask when out in public, stay away from anyone that may not be feeling well and notify your surgeon if you develop symptoms. If you have been in contact with anyone that has tested positive in the last 10 days please notify you surgeon.  ?

## 2022-04-24 NOTE — Progress Notes (Signed)
Patient ID: MAVRIK FREIMARK                 DOB: Oct 30, 1956                    MRN: [id number] ? ? ? ? ?HPI: ?Shawn Daniels is a 66 y.o. male patient referred to lipid clinic by Dr. Acie Fredrickson for consideration of PCSK9i or inclisiran. PMH is significant for PVD s/p percutaneous angioplasty in [place]LE (01/08/22), coronary artery calcification (08/16/21: 1523, 97th percentile), HTN, HLD, T2DM (A1c 7.0% on 04/23/22). Pt scheduled for femoral endarterectomy tomorrow.  ? ?Pt presents today for follow-up. Frustrated by return of PVD symptoms, having procedure tomorrow. 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 Interested in medications that can reduce plaque, and inquires about a shot to reduce cholesterol. Currently taking rosuvastatin 20 mg twice weekly. Previously taking rosuvastatin 10 mg daily with some leg cramping, which worsened when escalated to rosuvastatin 20 mg daily. Notably, leg cramping coincided with PVD diagnosis. Also experienced some fatigue with daily statin dosing, which improved with PM administration. Pt willing to re-trial different statin and/or dosing regimen.  ? ?Current Medications: rosuvastatin 20 mg twice weekly ?Intolerances: rosuvastatin 10mg  and 20mg  daily - leg cramps  ?Risk Factors: ASCVD (PVD), T2DM, HTN ?LDL goal: 55mg /dL  ? ?Diet: red meat twice/week ? 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 Limited activity currently because of claudication, but averages 20,000 steps/day when able to dance.  ? ?Family History: HF in father. ? ?Social History: Former smoker (45 pack year hx), quit 2009. 3 cans of beer/week. Denies drug use.  ? 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 ?Labs: ?07/03/2021: TC 220, HDL 61, LDL 133, TG 157 (on rosuvastatin 10 mg daily) ? ?Past Medical History:  ?Diagnosis Date  ? Diabetes mellitus without complication ([place])   ? GERD (gastroesophageal reflux disease)   ? Hypertension   ? Peripheral vascular disease ([place])   ? ? ?Current  Outpatient Medications on File Prior to Visit  ?Medication Sig Dispense Refill  ? ACCU-CHEK GUIDE test strip USE 1 TEST STRIP TWICE DAILY    ? acetaminophen (TYLENOL) 500 MG tablet Take 1,000 mg by mouth every 6 (six) hours as needed for headache. (Patient not taking: Reported on 04/03/2022)    ? ASPIRIN 81 PO Take 81 mg by mouth at bedtime.    ? clopidogrel (PLAVIX) 75 MG tablet Take 1 tablet (75 mg total) by mouth daily. 90 tablet 1  ? GLYXAMBI 25-5 MG TABS Take 1 tablet by mouth daily.    ? lisinopril (PRINIVIL,ZESTRIL) 10 MG tablet Take 10 mg by mouth in the morning and at bedtime.    ? Magnesium 400 MG CAPS Take 400 mg by mouth daily. 400mg  in am 800mg   in pm    ? metoprolol succinate (TOPROL-XL) 25 MG 24 hr tablet Take 0.5 tablets (12.5 mg total) by mouth daily. Take with or immediately following a meal. 45 tablet 3  ? NOVOLIN 70/30 RELION (70-30) 100 UNIT/ML injection 25-30 Units 2 (two) times daily with a meal. SLIDING Scale    ? omeprazole (PRILOSEC) 20 MG capsule Take 20 mg by mouth daily.    ? POTASSIUM CHLORIDE PO Take 99 mg by mouth daily.    ? RELION INSULIN SYRINGE 31G X 15/64" 0.5 ML MISC USE 1 THREE TIMES DAILY    ? rosuvastatin (CRESTOR) 20 MG tablet Take 20 mg by mouth 2 (two) times a week.    ? ?  No current facility-administered medications on file prior to visit.  ? ? ?Allergies  ?Allergen Reactions  ? Codeine Itching  ?  Can take in lower doses  ? Keflex [Cephalexin] Other (See Comments)  ?  Looked older  ? Penicillins Other (See Comments)  ?  Childhood  ? ? ?Assessment/Plan: ? ?1. Hyperlipidemia - LDL 133 above goal <55 due to progressive PVD + DM, currently taking rosuvastatin 20 mg twice weekly, though last LDL taken on rosuvastatin 10 mg daily. Pt willing to increase statin frequency to rosuvastatin 10 mg every other day, and potentially up-titrate pending tolerability. He will cut 20 mg tablets with pill cutter. Additionally discussed LDL lowering, plaque regression, and CV benefit with  PCSK9 inhibitor like Repatha or Praluent, and provided injection technique education. Patient interested in starting PCSK9i. Inclisiran unlikely to be covered under his current insurance, though would also be appropriate. Will submit PA for Repatha and follow up with pt via telephone when coverage status is known to evaluate statin tolerability and schedule follow-up labs pending medication initiation. Pt will also qualify for $5/month copay card. ? ?Pt seen with Park Liter, P4 pharmacy student ? ?Megan E. Supple, PharmD, BCACP, CPP ?[id number] [address], [place], [place] [address] ?Phone: [phone]; Fax: [phone] ?04/25/2022 4:27 PM ? ? ? ? ?

## 2022-04-25 ENCOUNTER — Ambulatory Visit: Payer: Managed Care, Other (non HMO) | Admitting: Pharmacist

## 2022-04-25 DIAGNOSIS — I739 Peripheral vascular disease, unspecified: Secondary | ICD-10-CM

## 2022-04-25 DIAGNOSIS — I10 Essential (primary) hypertension: Secondary | ICD-10-CM | POA: Insufficient documentation

## 2022-04-25 DIAGNOSIS — E782 Mixed hyperlipidemia: Secondary | ICD-10-CM

## 2022-04-25 DIAGNOSIS — E109 Type 1 diabetes mellitus without complications: Secondary | ICD-10-CM | POA: Insufficient documentation

## 2022-04-25 NOTE — Patient Instructions (Signed)
Your LDL cholesterol is 133 and your goal is < 55 ? ?I will submit information to your insurance for Repatha and let you know when I hear back.  ?  ?Repatha is a subcutaneous injection given once every 2 weeks in the fatty tissue of your stomach or upper outer thigh. Store the medication in the fridge. You can let your dose warm up to room temperature for 30 minutes before injecting if you prefer. Repatha will lower your LDL cholesterol by 60% and helps to lower your chance of having a heart attack or stroke. ? ? ?

## 2022-04-26 ENCOUNTER — Inpatient Hospital Stay (HOSPITAL_COMMUNITY)
Admission: RE | Admit: 2022-04-26 | Discharge: 2022-04-27 | DRG: 254 | Disposition: A | Discharge status: Home / Self Care | Payer: Managed Care, Other (non HMO) | Attending: Surgery | Admitting: Surgery

## 2022-04-26 ENCOUNTER — Other Ambulatory Visit: Payer: Self-pay

## 2022-04-26 ENCOUNTER — Encounter (HOSPITAL_COMMUNITY): Payer: Self-pay | Admitting: Surgery

## 2022-04-26 ENCOUNTER — Inpatient Hospital Stay (HOSPITAL_COMMUNITY): Payer: Managed Care, Other (non HMO) | Admitting: Certified Registered Nurse Anesthetist

## 2022-04-26 ENCOUNTER — Inpatient Hospital Stay (HOSPITAL_COMMUNITY): Payer: Managed Care, Other (non HMO) | Admitting: Physician Assistant

## 2022-04-26 ENCOUNTER — Encounter (HOSPITAL_COMMUNITY)
Admission: RE | Disposition: A | Discharge status: Home / Self Care | Payer: Self-pay | Source: Home / Self Care | Attending: Surgery

## 2022-04-26 DIAGNOSIS — Z888 Allergy status to other drugs, medicaments and biological substances status: Secondary | ICD-10-CM

## 2022-04-26 DIAGNOSIS — Z880 Allergy status to penicillin: Secondary | ICD-10-CM

## 2022-04-26 DIAGNOSIS — I70211 Atherosclerosis of native arteries of extremities with intermittent claudication, right leg: Secondary | ICD-10-CM | POA: Diagnosis not present

## 2022-04-26 DIAGNOSIS — I10 Essential (primary) hypertension: Secondary | ICD-10-CM

## 2022-04-26 DIAGNOSIS — Z7902 Long term (current) use of antithrombotics/antiplatelets: Secondary | ICD-10-CM

## 2022-04-26 DIAGNOSIS — K219 Gastro-esophageal reflux disease without esophagitis: Secondary | ICD-10-CM | POA: Diagnosis present

## 2022-04-26 DIAGNOSIS — Z794 Long term (current) use of insulin: Secondary | ICD-10-CM

## 2022-04-26 DIAGNOSIS — Z7982 Long term (current) use of aspirin: Secondary | ICD-10-CM

## 2022-04-26 DIAGNOSIS — Z79899 Other long term (current) drug therapy: Secondary | ICD-10-CM

## 2022-04-26 DIAGNOSIS — E1151 Type 2 diabetes mellitus with diabetic peripheral angiopathy without gangrene: Secondary | ICD-10-CM | POA: Diagnosis present

## 2022-04-26 DIAGNOSIS — E1051 Type 1 diabetes mellitus with diabetic peripheral angiopathy without gangrene: Secondary | ICD-10-CM

## 2022-04-26 DIAGNOSIS — Z885 Allergy status to narcotic agent status: Secondary | ICD-10-CM

## 2022-04-26 DIAGNOSIS — Z8249 Family history of ischemic heart disease and other diseases of the circulatory system: Secondary | ICD-10-CM | POA: Diagnosis not present

## 2022-04-26 DIAGNOSIS — I739 Peripheral vascular disease, unspecified: Principal | ICD-10-CM | POA: Diagnosis present

## 2022-04-26 DIAGNOSIS — Z87891 Personal history of nicotine dependence: Secondary | ICD-10-CM | POA: Diagnosis not present

## 2022-04-26 HISTORY — PX: ENDARTERECTOMY FEMORAL: SHX5804

## 2022-04-26 HISTORY — PX: PATCH ANGIOPLASTY: SHX6230

## 2022-04-26 LAB — CBC
HCT: 39.4 % (ref 39.0–52.0)
Hemoglobin: 12.9 g/dL — ABNORMAL LOW (ref 13.0–17.0)
MCH: 32.3 pg (ref 26.0–34.0)
MCHC: 32.7 g/dL (ref 30.0–36.0)
MCV: 98.5 fL (ref 80.0–100.0)
Platelets: UNDETERMINED 10*3/uL (ref 150–400)
RBC: 4 MIL/uL — ABNORMAL LOW (ref 4.22–5.81)
RDW: 12.4 % (ref 11.5–15.5)
WBC: 7.1 10*3/uL (ref 4.0–10.5)
nRBC: 0 % (ref 0.0–0.2)

## 2022-04-26 LAB — GLUCOSE, CAPILLARY
Glucose-Capillary: 86 mg/dL (ref 70–99)
Glucose-Capillary: 183 mg/dL — ABNORMAL HIGH (ref 70–99)
Glucose-Capillary: 145 mg/dL — ABNORMAL HIGH (ref 70–99)
Glucose-Capillary: 135 mg/dL — ABNORMAL HIGH (ref 70–99)
Glucose-Capillary: 206 mg/dL — ABNORMAL HIGH (ref 70–99)

## 2022-04-26 LAB — CREATININE, SERUM
Creatinine, Ser: 0.78 mg/dL (ref 0.61–1.24)
GFR, Estimated: >60 mL/min (ref >60)

## 2022-04-26 LAB — POCT ACTIVATED CLOTTING TIME
Activated Clotting Time: 209 seconds
Activated Clotting Time: 257 seconds

## 2022-04-26 LAB — ABO/RH: ABO/RH(D): A POS

## 2022-04-26 SURGERY — ENDARTERECTOMY, FEMORAL
Anesthesia: General | Site: Groin | Laterality: Right

## 2022-04-26 MED ORDER — MIDAZOLAM HCL 5 MG/5ML IJ SOLN
INTRAMUSCULAR | Status: DC | PRN
  Administered 2022-04-26: 2 mg via INTRAVENOUS

## 2022-04-26 MED ORDER — HYDROMORPHONE HCL 1 MG/ML IJ SOLN
INTRAMUSCULAR | Status: DC | PRN
  Administered 2022-04-26 (×2): .25 mg via INTRAVENOUS

## 2022-04-26 MED ORDER — FENTANYL CITRATE (PF) 100 MCG/2ML IJ SOLN
INTRAMUSCULAR | Status: DC | PRN
  Administered 2022-04-26 (×2): 50 ug via INTRAVENOUS
  Administered 2022-04-26: 100 ug via INTRAVENOUS

## 2022-04-26 MED ORDER — DOCUSATE SODIUM 100 MG PO CAPS
100.0000 mg | ORAL_CAPSULE | Freq: Every day | ORAL | Status: DC
  Administered 2022-04-27: 100 mg via ORAL
  Filled 2022-04-26: qty 1

## 2022-04-26 MED ORDER — HEPARIN 6000 UNIT IRRIGATION SOLUTION
Status: DC | PRN
  Administered 2022-04-26: 1

## 2022-04-26 MED ORDER — POTASSIUM CHLORIDE CRYS ER 20 MEQ PO TBCR: 20.0000 meq | EXTENDED_RELEASE_TABLET | Freq: Every day | ORAL | Status: DC | PRN

## 2022-04-26 MED ORDER — HYDROMORPHONE HCL 1 MG/ML IJ SOLN
0.2500 mg | INTRAMUSCULAR | Status: DC | PRN
  Administered 2022-04-26 (×2): 0.5 mg via INTRAVENOUS

## 2022-04-26 MED ORDER — PROTAMINE SULFATE 10 MG/ML IV SOLN
INTRAVENOUS | Status: DC | PRN
  Administered 2022-04-26 (×2): 5 mg via INTRAVENOUS
  Administered 2022-04-26 (×4): 10 mg via INTRAVENOUS

## 2022-04-26 MED ORDER — HEPARIN 6000 UNIT IRRIGATION SOLUTION
Status: AC
  Filled 2022-04-26: qty 500

## 2022-04-26 MED ORDER — DEXMEDETOMIDINE (PRECEDEX) IN NS 20 MCG/5ML (4 MCG/ML) IV SYRINGE
PREFILLED_SYRINGE | INTRAVENOUS | Status: DC | PRN
  Administered 2022-04-26: 8 ug via INTRAVENOUS

## 2022-04-26 MED ORDER — VANCOMYCIN HCL IN DEXTROSE 1-5 GM/200ML-% IV SOLN
1000.0000 mg | Freq: Two times a day (BID) | INTRAVENOUS | Status: AC
  Administered 2022-04-26 – 2022-04-27 (×2): 1000 mg via INTRAVENOUS
  Filled 2022-04-26 (×2): qty 200

## 2022-04-26 MED ORDER — 0.9 % SODIUM CHLORIDE (POUR BTL) OPTIME
TOPICAL | Status: DC | PRN
  Administered 2022-04-26: 1000 mL

## 2022-04-26 MED ORDER — HYDROMORPHONE HCL 1 MG/ML IJ SOLN
INTRAMUSCULAR | Status: AC
  Filled 2022-04-26: qty 1

## 2022-04-26 MED ORDER — ACETAMINOPHEN 10 MG/ML IV SOLN: 1000.0000 mg | Freq: Once | INTRAVENOUS | Status: DC | PRN

## 2022-04-26 MED ORDER — AMISULPRIDE (ANTIEMETIC) 5 MG/2ML IV SOLN: 10.0000 mg | Freq: Once | INTRAVENOUS | Status: DC | PRN

## 2022-04-26 MED ORDER — CHLORHEXIDINE GLUCONATE CLOTH 2 % EX PADS
6.0000 | MEDICATED_PAD | Freq: Once | CUTANEOUS | Status: DC
Start: 2022-04-26 — End: 2022-04-26

## 2022-04-26 MED ORDER — ROCURONIUM BROMIDE 10 MG/ML (PF) SYRINGE
PREFILLED_SYRINGE | INTRAVENOUS | Status: DC | PRN
  Administered 2022-04-26: 30 mg via INTRAVENOUS

## 2022-04-26 MED ORDER — OXYCODONE-ACETAMINOPHEN 5-325 MG PO TABS
1.0000 | ORAL_TABLET | ORAL | Status: DC | PRN
  Administered 2022-04-26 – 2022-04-27 (×3): 2 via ORAL
  Filled 2022-04-26 (×3): qty 2

## 2022-04-26 MED ORDER — FENTANYL CITRATE (PF) 250 MCG/5ML IJ SOLN
INTRAMUSCULAR | Status: AC
  Filled 2022-04-26: qty 5

## 2022-04-26 MED ORDER — ONDANSETRON HCL 4 MG/2ML IJ SOLN
4.0000 mg | Freq: Four times a day (QID) | INTRAMUSCULAR | Status: DC | PRN
  Administered 2022-04-26: 4 mg via INTRAVENOUS
  Filled 2022-04-26: qty 2

## 2022-04-26 MED ORDER — ACETAMINOPHEN 325 MG PO TABS: 325.0000 mg | ORAL_TABLET | Freq: Once | ORAL | Status: DC | PRN

## 2022-04-26 MED ORDER — HEPARIN SODIUM (PORCINE) 1000 UNIT/ML IJ SOLN
INTRAMUSCULAR | Status: DC | PRN
  Administered 2022-04-26: 1000 [IU] via INTRAVENOUS
  Administered 2022-04-26: 8000 [IU] via INTRAVENOUS

## 2022-04-26 MED ORDER — MAGNESIUM SULFATE 2 GM/50ML IV SOLN: 2.0000 g | Freq: Every day | INTRAVENOUS | Status: DC | PRN

## 2022-04-26 MED ORDER — PHENYLEPHRINE HCL-NACL 20-0.9 MG/250ML-% IV SOLN
INTRAVENOUS | Status: DC | PRN
  Administered 2022-04-26: 20 ug/min via INTRAVENOUS

## 2022-04-26 MED ORDER — LACTATED RINGERS IV SOLN: INTRAVENOUS | Status: DC

## 2022-04-26 MED ORDER — INSULIN ASPART 100 UNIT/ML IJ SOLN
0.0000 [IU] | Freq: Three times a day (TID) | INTRAMUSCULAR | Status: DC
  Administered 2022-04-26: 3 [IU] via SUBCUTANEOUS
  Administered 2022-04-27: 8 [IU] via SUBCUTANEOUS

## 2022-04-26 MED ORDER — METOPROLOL TARTRATE 5 MG/5ML IV SOLN: 2.0000 mg | INTRAVENOUS | Status: DC | PRN

## 2022-04-26 MED ORDER — HEMOSTATIC AGENTS (NO CHARGE) OPTIME
TOPICAL | Status: DC | PRN
  Administered 2022-04-26: 1 via TOPICAL

## 2022-04-26 MED ORDER — ONDANSETRON HCL 4 MG/2ML IJ SOLN
INTRAMUSCULAR | Status: DC | PRN
  Administered 2022-04-26: 4 mg via INTRAVENOUS

## 2022-04-26 MED ORDER — PROPOFOL 10 MG/ML IV BOLUS
INTRAVENOUS | Status: AC
  Filled 2022-04-26: qty 20

## 2022-04-26 MED ORDER — LACTATED RINGERS IV SOLN: INTRAVENOUS | Status: DC | PRN

## 2022-04-26 MED ORDER — HEPARIN SODIUM (PORCINE) 5000 UNIT/ML IJ SOLN
5000.0000 [IU] | Freq: Three times a day (TID) | INTRAMUSCULAR | Status: DC
  Administered 2022-04-27: 5000 [IU] via SUBCUTANEOUS
  Filled 2022-04-26: qty 1

## 2022-04-26 MED ORDER — ACETAMINOPHEN 325 MG PO TABS: 325.0000 mg | ORAL_TABLET | ORAL | Status: DC | PRN

## 2022-04-26 MED ORDER — MORPHINE SULFATE (PF) 2 MG/ML IV SOLN: 2.0000 mg | INTRAVENOUS | Status: DC | PRN

## 2022-04-26 MED ORDER — ROSUVASTATIN CALCIUM 5 MG PO TABS
10.0000 mg | ORAL_TABLET | ORAL | Status: DC
  Administered 2022-04-26: 10 mg via ORAL
  Filled 2022-04-26: qty 2

## 2022-04-26 MED ORDER — SODIUM CHLORIDE 0.9 % IV SOLN: INTRAVENOUS | Status: DC

## 2022-04-26 MED ORDER — MIDAZOLAM HCL 2 MG/2ML IJ SOLN
INTRAMUSCULAR | Status: AC
  Filled 2022-04-26: qty 2

## 2022-04-26 MED ORDER — ORAL CARE MOUTH RINSE: 15.0000 mL | Freq: Once | OROMUCOSAL | Status: AC

## 2022-04-26 MED ORDER — CHLORHEXIDINE GLUCONATE 0.12 % MT SOLN
15.0000 mL | Freq: Once | OROMUCOSAL | Status: AC
  Administered 2022-04-26: 15 mL via OROMUCOSAL
  Filled 2022-04-26: qty 15

## 2022-04-26 MED ORDER — ACETAMINOPHEN 160 MG/5ML PO SOLN: 325.0000 mg | Freq: Once | ORAL | Status: DC | PRN

## 2022-04-26 MED ORDER — SUGAMMADEX SODIUM 200 MG/2ML IV SOLN
INTRAVENOUS | Status: DC | PRN
  Administered 2022-04-26: 200 mg via INTRAVENOUS

## 2022-04-26 MED ORDER — ACETAMINOPHEN 650 MG RE SUPP: 325.0000 mg | RECTAL | Status: DC | PRN

## 2022-04-26 MED ORDER — CLOPIDOGREL BISULFATE 75 MG PO TABS
75.0000 mg | ORAL_TABLET | Freq: Every day | ORAL | Status: DC
  Administered 2022-04-27: 75 mg via ORAL
  Filled 2022-04-26: qty 1

## 2022-04-26 MED ORDER — SODIUM CHLORIDE 0.9 % IV SOLN: 500.0000 mL | Freq: Once | INTRAVENOUS | Status: DC | PRN

## 2022-04-26 MED ORDER — PHENYLEPHRINE 80 MCG/ML (10ML) SYRINGE FOR IV PUSH (FOR BLOOD PRESSURE SUPPORT)
PREFILLED_SYRINGE | INTRAVENOUS | Status: DC | PRN
  Administered 2022-04-26 (×2): 40 ug via INTRAVENOUS

## 2022-04-26 MED ORDER — CHLORHEXIDINE GLUCONATE CLOTH 2 % EX PADS: 6.0000 | MEDICATED_PAD | Freq: Once | CUTANEOUS | Status: DC

## 2022-04-26 MED ORDER — ALUM & MAG HYDROXIDE-SIMETH 200-200-20 MG/5ML PO SUSP: 15.0000 mL | ORAL | Status: DC | PRN

## 2022-04-26 MED ORDER — GUAIFENESIN-DM 100-10 MG/5ML PO SYRP: 15.0000 mL | ORAL_SOLUTION | ORAL | Status: DC | PRN

## 2022-04-26 MED ORDER — PHENOL 1.4 % MT LIQD: 1.0000 | OROMUCOSAL | Status: DC | PRN

## 2022-04-26 MED ORDER — INSULIN ASPART 100 UNIT/ML IJ SOLN
0.0000 [IU] | INTRAMUSCULAR | Status: DC | PRN
  Administered 2022-04-26: 4 [IU] via SUBCUTANEOUS

## 2022-04-26 MED ORDER — LABETALOL HCL 5 MG/ML IV SOLN
10.0000 mg | INTRAVENOUS | Status: DC | PRN
  Administered 2022-04-26: 10 mg via INTRAVENOUS
  Filled 2022-04-26: qty 4

## 2022-04-26 MED ORDER — HYDROMORPHONE HCL 1 MG/ML IJ SOLN
INTRAMUSCULAR | Status: AC
  Filled 2022-04-26: qty 0.5

## 2022-04-26 MED ORDER — LISINOPRIL 10 MG PO TABS: 10.0000 mg | ORAL_TABLET | Freq: Every day | ORAL | Status: DC

## 2022-04-26 MED ORDER — PROPOFOL 10 MG/ML IV BOLUS
INTRAVENOUS | Status: DC | PRN
  Administered 2022-04-26 (×2): 30 mg via INTRAVENOUS

## 2022-04-26 MED ORDER — METOPROLOL SUCCINATE ER 25 MG PO TB24
12.5000 mg | ORAL_TABLET | Freq: Every day | ORAL | Status: DC
  Administered 2022-04-26 – 2022-04-27 (×2): 12.5 mg via ORAL
  Filled 2022-04-26 (×2): qty 1

## 2022-04-26 MED ORDER — PANTOPRAZOLE SODIUM 40 MG PO TBEC
40.0000 mg | DELAYED_RELEASE_TABLET | Freq: Every day | ORAL | Status: DC
  Administered 2022-04-26 – 2022-04-27 (×2): 40 mg via ORAL
  Filled 2022-04-26 (×2): qty 1

## 2022-04-26 MED ORDER — VANCOMYCIN HCL IN DEXTROSE 1-5 GM/200ML-% IV SOLN
1000.0000 mg | INTRAVENOUS | Status: AC
  Administered 2022-04-26: 1000 mg via INTRAVENOUS
  Filled 2022-04-26: qty 200

## 2022-04-26 MED ORDER — HYDRALAZINE HCL 20 MG/ML IJ SOLN: 5.0000 mg | INTRAMUSCULAR | Status: DC | PRN

## 2022-04-26 MED ORDER — MEPERIDINE HCL 25 MG/ML IJ SOLN: 6.2500 mg | INTRAMUSCULAR | Status: DC | PRN

## 2022-04-26 MED ORDER — ASPIRIN 81 MG PO CHEW
81.0000 mg | CHEWABLE_TABLET | Freq: Every day | ORAL | Status: DC
  Administered 2022-04-26 – 2022-04-27 (×2): 81 mg via ORAL
  Filled 2022-04-26 (×2): qty 1

## 2022-04-26 SURGICAL SUPPLY — 49 items
ADH SKN CLS APL DERMABOND .7 (GAUZE/BANDAGES/DRESSINGS) ×1
AGENT HMST KT MTR STRL THRMB (HEMOSTASIS) ×1
BAG COUNTER SPONGE SURGICOUNT (BAG) ×3 IMPLANT
BAG SPNG CNTER NS LX DISP (BAG) ×1
CANISTER SUCT 3000ML PPV (MISCELLANEOUS) ×3 IMPLANT
CLIP VESOCCLUDE MED 24/CT (CLIP) ×3 IMPLANT
CLIP VESOCCLUDE SM WIDE 24/CT (CLIP) ×3 IMPLANT
COVER PROBE W GEL 5X96 (DRAPES) ×2 IMPLANT
DERMABOND ADVANCED (GAUZE/BANDAGES/DRESSINGS) ×1
DERMABOND ADVANCED .7 DNX12 (GAUZE/BANDAGES/DRESSINGS) ×2 IMPLANT
DRAIN CHANNEL 15F RND FF W/TCR (WOUND CARE) IMPLANT
DRAPE X-RAY CASS 24X20 (DRAPES) IMPLANT
ELECT REM PT RETURN 9FT ADLT (ELECTROSURGICAL) ×2
ELECTRODE REM PT RTRN 9FT ADLT (ELECTROSURGICAL) ×2 IMPLANT
EVACUATOR SILICONE 100CC (DRAIN) IMPLANT
GAUZE 4X4 16PLY ~~LOC~~+RFID DBL (SPONGE) ×1 IMPLANT
GLOVE SURG SS PI 7.5 STRL IVOR (GLOVE) ×9 IMPLANT
GOWN STRL REUS W/ TWL LRG LVL3 (GOWN DISPOSABLE) ×4 IMPLANT
GOWN STRL REUS W/ TWL XL LVL3 (GOWN DISPOSABLE) ×2 IMPLANT
GOWN STRL REUS W/TWL LRG LVL3 (GOWN DISPOSABLE) ×4
GOWN STRL REUS W/TWL XL LVL3 (GOWN DISPOSABLE) ×2
GRAFT VASC PATCH XENOSURE 1X14 (Vascular Products) ×1 IMPLANT
HEMOSTAT SNOW SURGICEL 2X4 (HEMOSTASIS) IMPLANT
KIT BASIN OR (CUSTOM PROCEDURE TRAY) ×3 IMPLANT
KIT TURNOVER KIT B (KITS) ×3 IMPLANT
NS IRRIG 1000ML POUR BTL (IV SOLUTION) ×6 IMPLANT
PACK PERIPHERAL VASCULAR (CUSTOM PROCEDURE TRAY) ×3 IMPLANT
PAD ARMBOARD 7.5X6 YLW CONV (MISCELLANEOUS) ×6 IMPLANT
PATCH VASC XENOSURE 1CMX6CM (Vascular Products) IMPLANT
PATCH VASC XENOSURE 1X6 (Vascular Products) IMPLANT
SET COLLECT BLD 21X3/4 12 (NEEDLE) IMPLANT
SET WALTER ACTIVATION W/DRAPE (SET/KITS/TRAYS/PACK) IMPLANT
SPONGE INTESTINAL PEANUT (DISPOSABLE) ×3 IMPLANT
SPONGE T-LAP 18X18 ~~LOC~~+RFID (SPONGE) ×2 IMPLANT
STOPCOCK 4 WAY LG BORE MALE ST (IV SETS) IMPLANT
SURGIFLO W/THROMBIN 8M KIT (HEMOSTASIS) ×1 IMPLANT
SUT ETHILON 3 0 PS 1 (SUTURE) IMPLANT
SUT PROLENE 5 0 C 1 24 (SUTURE) ×7 IMPLANT
SUT PROLENE 5 0 C 1 36 (SUTURE) ×1 IMPLANT
SUT PROLENE 6 0 BV (SUTURE) ×4 IMPLANT
SUT VIC AB 2-0 CT1 27 (SUTURE) ×2
SUT VIC AB 2-0 CT1 TAPERPNT 27 (SUTURE) ×2 IMPLANT
SUT VIC AB 3-0 SH 27 (SUTURE) ×2
SUT VIC AB 3-0 SH 27X BRD (SUTURE) ×2 IMPLANT
SUT VIC AB 3-0 X1 27 (SUTURE) ×3 IMPLANT
TOWEL GREEN STERILE (TOWEL DISPOSABLE) ×3 IMPLANT
TUBING EXTENTION W/L.L. (IV SETS) IMPLANT
UNDERPAD 30X36 HEAVY ABSORB (UNDERPADS AND DIAPERS) ×3 IMPLANT
WATER STERILE IRR 1000ML POUR (IV SOLUTION) ×3 IMPLANT

## 2022-04-26 NOTE — Op Note (Signed)
? ? ?  Patient name: Shawn Daniels MRN: [id number] DOB: 03-16-1956 Sex: male ? ?04/26/2022 ?Pre-operative Diagnosis: Right leg claudication ?Post-operative diagnosis:  Same ?Surgeon:  Durene Cal ?Assistants: Aggie Moats, PA ?Procedure:   #1: Right iliofemoral endarterectomy with bovine pericardial patch angioplasty ?Anesthesia:  General ?Blood Loss:  200cc ?Specimens:  none ? ?Findings: Extensive nearly occlusive calcific plaque in the common femoral artery extending up into the distal external iliac artery.  The plaque also extended down onto the superficial femoral artery and profundofemoral artery.  A long patch approximately 8 cm was utilized, starting in the proximal common femoral artery and going down onto the superficial femoral artery for approximately 3 cm. ? ?Indications: This is a 66 year old gentleman with severe right leg claudication.  He is undergone percutaneous intervention but did not get relief.  He comes in today for surgical revascularization. ? ?Procedure:  The patient was identified in the holding area and taken to [facility] OR ROOM 16  The patient was then placed supine on the table. general anesthesia was administered.  The patient was prepped and draped in the usual sterile fashion.  A time out was called and antibiotics were administered.  A PA was necessary to expedite the procedure and assist with technical details. ? ?A vertical incision was made in the right groin.  Cautery was used divide subcutaneous tissue down to the femoral sheath which was opened sharply.  I exposed the common femoral artery from the inguinal ligament down to the bifurcation.  The crossing iliac vein was ligated.  I then isolated 3 main profunda branches and continue the dissection down onto the superficial femoral artery for approximately 5 cm where it was relatively soft.  The PA helped with the exposure by providing retraction and suction.  At this point, the patient was fully heparinized.  After the heparin  circulated, the vessels were occluded.  A #11 that was used to make an arteriotomy was extended longitudinally with Potts scissors.  The arteriotomy began in the proximal common femoral artery and extended down onto the superficial femoral artery for 3 cm.  A Madelaine Etienne elevator was used to perform endarterectomy.  This included endarterectomy of the distal external iliac artery.  Eversion endarterectomy was performed on the profunda branches.  I also performed endarterectomy of the superficial femoral artery.  The plaque continued and when I got to a good endpoint I transected the plaque and tacked down the end with 6-0 Prolene.  Next a bovine pericardial patch was selected.  Patch angioplasty was performed with a running 5-0 Prolene with the assistance of the PA following the suture..  Prior to completion the probe flush maneuvers were performed and the anastomosis was completed.  Several repair stitches were required for hemostasis.  Hand-held Doppler was used to evaluate the signal in the profunda branches as well as superficial femoral artery which were multiphasic.  50 mg of protamine was administered.  Surgi-Flo was used to facilitate hemostasis.  Once I was satisfied with hemostasis, the femoral sheath was reapproximated 2-0 Vicryl.  The subcutaneous tissue was then closed with additional layers of Vicryl followed by subcuticular closure and Dermabond.  There were no immediate complications. ? ? ?Disposition: To PACU stable. ? ? ?V. Durene Cal, M.D., FACS ?[facility] ?Office: [phone] ?Pager:  [phone]  ?

## 2022-04-26 NOTE — Anesthesia Postprocedure Evaluation (Signed)
Anesthesia Post Note ? ?Patient: Shawn Daniels ? ?Procedure(s) Performed: RIGHT FEMORAL ENDARTERECTOMY (Right: Groin) ?PATCH ANGIOPLASTY OF RIGHT FEMORAL ARTERY USING XENOSURE BOVINE PERCARDIUM PATCH (Right: Groin) ? ?  ? ?Patient location during evaluation: PACU ?Anesthesia Type: General ?Level of consciousness: awake and alert ?Pain management: pain level controlled ?Vital Signs Assessment: post-procedure vital signs reviewed and stable ?Respiratory status: spontaneous breathing, nonlabored ventilation, respiratory function stable and patient connected to nasal cannula oxygen ?Cardiovascular status: blood pressure returned to baseline and stable ?Postop Assessment: no apparent nausea or vomiting ?Anesthetic complications: no ? ? ?No notable events documented. ? ?Last Vitals:  ?Vitals:  ? 04/26/22 1400 04/26/22 1427  ?BP:  129/84  ?Pulse: 64 (!) 56  ?Resp: 11 15  ?Temp: (!) 36.3 ?C (!) 36.4 ?C  ?SpO2: 98% 100%  ?  ?Last Pain:  ?Vitals:  ? 04/26/22 1427  ?TempSrc: Oral  ?PainSc: 0-No pain  ? ? ?  ?  ?  ?  ?  ?  ? ?Shelton Silvas ? ? ? ? ?

## 2022-04-26 NOTE — Anesthesia Procedure Notes (Signed)
Arterial Line Insertion ?Start/End5/04/2022 7:05 AM, 04/26/2022 7:15 AM ?Performed by: Shelton Silvas, MD, CRNA ? Lidocaine 1% used for infiltration ?Right, radial was placed ?Catheter size: 20 G ? ?Attempts: 1 ?Procedure performed without using ultrasound guided technique. ?Following insertion, dressing applied and Biopatch. ?Post procedure assessment: normal and unchanged ? ?Patient tolerated the procedure well with no immediate complications. ? ? ?

## 2022-04-26 NOTE — Transfer of Care (Signed)
Immediate Anesthesia Transfer of Care Note ? ?Patient: Shawn Daniels ? ?Procedure(s) Performed: RIGHT FEMORAL ENDARTERECTOMY (Right: Groin) ?PATCH ANGIOPLASTY OF RIGHT FEMORAL ARTERY USING XENOSURE BOVINE PERCARDIUM PATCH (Right: Groin) ? ?Patient Location: PACU ? ?Anesthesia Type:General ? ?Level of Consciousness: drowsy and patient cooperative ? ?Airway & Oxygen Therapy: Patient Spontanous Breathing and Patient connected to nasal cannula oxygen ? ?Post-op Assessment: Report given to RN and Post -op Vital signs reviewed and stable ? ?Post vital signs: Reviewed and stable ? ?Last Vitals:  ?Vitals Value Taken Time  ?BP 105/46 04/26/22 1025  ?Temp    ?Pulse 74 04/26/22 1029  ?Resp 10 04/26/22 1029  ?SpO2 93 % 04/26/22 1029  ?Vitals shown include unvalidated device data. ? ?Last Pain:  ?Vitals:  ? 04/26/22 0614  ?TempSrc:   ?PainSc: 0-No pain  ?   ? ?  ? ?Complications: No notable events documented. ?

## 2022-04-26 NOTE — Anesthesia Procedure Notes (Signed)
Procedure Name: Intubation ?Date/Time: 04/26/2022 7:56 AM ?Performed by: Georgia Duff, CRNA ?Pre-anesthesia Checklist: Patient identified, Emergency Drugs available, Suction available and Patient being monitored ?Patient Re-evaluated:Patient Re-evaluated prior to induction ?Oxygen Delivery Method: Circle System Utilized ?Preoxygenation: Pre-oxygenation with 100% oxygen ?Induction Type: IV induction ?Ventilation: Mask ventilation without difficulty ?Laryngoscope Size: Sabra Heck and 2 ?Grade View: Grade II ?Tube type: Oral ?Tube size: 7.5 mm ?Number of attempts: 1 ?Airway Equipment and Method: Stylet and Oral airway ?Placement Confirmation: ETT inserted through vocal cords under direct vision, positive ETCO2 and breath sounds checked- equal and bilateral ?Secured at: 21 cm ?Tube secured with: Tape ?Dental Injury: Teeth and Oropharynx as per pre-operative assessment  ? ? ? ? ?

## 2022-04-26 NOTE — Anesthesia Preprocedure Evaluation (Addendum)
Anesthesia Evaluation  ?Patient identified by MRN, date of birth, ID band ?Patient awake ? ? ? ?Reviewed: ?Allergy & Precautions, NPO status , Patient's Chart, lab work & pertinent test results ? ?Airway ?Mallampati: I ? ?TM Distance: >3 FB ?Neck ROM: Full ? ? ? Dental ? ?(+) Caps, Dental Advisory Given ?  ?Pulmonary ?former smoker,  ?  ?breath sounds clear to auscultation ? ? ? ? ? ? Cardiovascular ?hypertension, + Peripheral Vascular Disease  ? ?Rhythm:Regular Rate:Normal ? ? ?  ?Neuro/Psych ?negative neurological ROS ? negative psych ROS  ? GI/Hepatic ?Neg liver ROS, GERD  ,  ?Endo/Other  ?diabetes, Type 1 ? Renal/GU ?negative Renal ROS  ? ?  ?Musculoskeletal ?negative musculoskeletal ROS ?(+)  ? Abdominal ?Normal abdominal exam  (+)   ?Peds ? Hematology ?negative hematology ROS ?(+)   ?Anesthesia Other Findings ? ? Reproductive/Obstetrics ? ?  ? ? ? ? ? ? ? ? ? ? ? ? ? ?  ?  ? ? ? ? ? ? ? ?Anesthesia Physical ?Anesthesia Plan ? ?ASA: 3 ? ?Anesthesia Plan: General  ? ?Post-op Pain Management:   ? ?Induction: Intravenous ? ?PONV Risk Score and Plan: 3 and Ondansetron, Dexamethasone and Midazolam ? ?Airway Management Planned: Oral ETT ? ?Additional Equipment: Arterial line ? ?Intra-op Plan:  ? ?Post-operative Plan: Extubation in OR ? ?Informed Consent: I have reviewed the patients History and Physical, chart, labs and discussed the procedure including the risks, benefits and alternatives for the proposed anesthesia with the patient or authorized representative who has indicated his/her understanding and acceptance.  ? ? ? ?Dental advisory given ? ?Plan Discussed with: CRNA ? ?Anesthesia Plan Comments:   ? ? ? ? ? ?Anesthesia Quick Evaluation ? ?

## 2022-04-26 NOTE — Interval H&P Note (Signed)
History and Physical Interval Note: ? ?04/26/2022 ?7:27 AM ? ?Shawn Daniels  has presented today for surgery, with the diagnosis of Atherosclerosis of native artery of both lower extremities with intermittent claudication.  The various methods of treatment have been discussed with the patient and family. After consideration of risks, benefits and other options for treatment, the patient has consented to  Procedure(s): ?RIGHT FEMORAL ENDARTERECTOMY (Right) as a surgical intervention.  The patient's history has been reviewed, patient examined, no change in status, stable for surgery.  I have reviewed the patient's chart and labs.  Questions were answered to the patient's satisfaction.   ? ? ?[name] ? ? ?

## 2022-04-27 LAB — BASIC METABOLIC PANEL
Anion gap: 15 (ref 5–15)
BUN: 18 mg/dL (ref 8–23)
CO2: 17 mmol/L — ABNORMAL LOW (ref 22–32)
Calcium: 8.9 mg/dL (ref 8.9–10.3)
Chloride: 101 mmol/L (ref 98–111)
Creatinine, Ser: 1.12 mg/dL (ref 0.61–1.24)
GFR, Estimated: >60 mL/min (ref >60)
Glucose, Bld: 316 mg/dL — ABNORMAL HIGH (ref 70–99)
Potassium: 4.7 mmol/L (ref 3.5–5.1)
Sodium: 133 mmol/L — ABNORMAL LOW (ref 135–145)

## 2022-04-27 LAB — CBC
HCT: 35.3 % — ABNORMAL LOW (ref 39.0–52.0)
Hemoglobin: 11.9 g/dL — ABNORMAL LOW (ref 13.0–17.0)
MCH: 33 pg (ref 26.0–34.0)
MCHC: 33.7 g/dL (ref 30.0–36.0)
MCV: 97.8 fL (ref 80.0–100.0)
Platelets: 108 10*3/uL — ABNORMAL LOW (ref 150–400)
RBC: 3.61 MIL/uL — ABNORMAL LOW (ref 4.22–5.81)
RDW: 12.5 % (ref 11.5–15.5)
WBC: 6.5 10*3/uL (ref 4.0–10.5)
nRBC: 0 % (ref 0.0–0.2)

## 2022-04-27 LAB — GLUCOSE, CAPILLARY: Glucose-Capillary: 280 mg/dL — ABNORMAL HIGH (ref 70–99)

## 2022-04-27 MED ORDER — OXYCODONE-ACETAMINOPHEN 5-325 MG PO TABS
1.0000 | ORAL_TABLET | Freq: Four times a day (QID) | ORAL | 0 refills | Status: DC | PRN
Start: 2022-04-27 — End: 2022-10-21

## 2022-04-27 NOTE — Progress Notes (Signed)
PT EVALUATION AND DISCHARGE ? 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 Pt reports 6/10 pain at surgical site. Overall, he is mobilizing well. Ambulated initial ~400 ft with a RW, then an additional 400 ft with no assistive device without physical difficulty. Negotiated 5 steps with a left handrail to simulate home set up. Education provided regarding activity recommendations and work restrictions. Pt with no further questions/concerns. No follow up PT or DME needed at d/c. Thank you for this consult. ? ?Lillia Pauls, PT, DPT ?Acute Rehabilitation Services ?Pager [phone] ?Office [phone] ? ? ? ? 04/27/22 0926  ?PT Visit Information  ?Last PT Received On 04/27/22  ?Assistance Needed +1  ?History of Present Illness Pt is a 66 y.o. M who presents 04/26/2022 for evaluation of claudication now s/p right femoral endarterectomy. Significant PMH: DM1, HTN, HLD, PVD.  ?Precautions  ?Precautions None  ?Restrictions  ?Weight Bearing Restrictions No  ?Home Living  ?Family/patient expects to be discharged to: Private residence  ?Living Arrangements Spouse/significant other  ?Available Help at Discharge Family  ?Type of Home House  ?Home Access Stairs to enter  ?Entrance Stairs-Number of Steps 2 ?(from garage)  ?Entrance Stairs-Rails Left  ?Home Layout One level  ?Bathroom Shower/Tub Walk-in shower ?(4 inch threshold)  ?Bathroom Accessibility Yes  ?Home Equipment Grab bars - toilet;Grab bars - tub/shower  ?Prior Function  ?Prior Level of Function  Independent/Modified Independent  ?Mobility Comments 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 for tasks assessed/performed  ?Overall Cognitive Status Within Functional Limits for tasks assessed  ?Upper Extremity Assessment  ?Upper Extremity Assessment Overall WFL for tasks assessed  ?Lower Extremity Assessment  ?Lower Extremity Assessment Overall WFL for tasks assessed  ?Cervical / Trunk Assessment  ?Cervical / Trunk Assessment Normal  ?Bed Mobility  ?Overal bed mobility Modified Independent  ?Transfers  ?Overall transfer level Independent  ?Equipment used None  ?Ambulation/Gait  ?Ambulation/Gait assistance Modified independent (Device/Increase time)  ?Gait Distance (Feet) 800 Feet  ?Assistive device Rolling walker (2 wheels);None  ?Gait Pattern/deviations Step-through pattern  ?General Gait Details Pt ambulating initial ~400 ft with RW and then rest of distance with no AD. Demonstrates right foot external rotation which is baseline. Cues for glute activation which pt demonstrated good carryover.  ?Stairs Yes  ?Stairs assistance Modified independent (Device/Increase time)  ?Stair Management One rail Left  ?Number of Stairs 5  ?General stair comments cues for step by step for comfort  ?Balance  ?Overall balance assessment No apparent balance deficits (not formally assessed)  ?PT - End of Session  ?Activity Tolerance Patient tolerated treatment well  ?Patient left in bed;with call bell/phone within reach  ?Nurse Communication Mobility status  ?PT Assessment  ?PT Recommendation/Assessment Patient does not need any further PT services  ?PT Visit Diagnosis Pain  ?Pain - Right/Left Right  ?Pain - part of body  ?(groin)  ?No Skilled PT All education completed;Patient at baseline level of functioning;Patient is modified independent with all activity/mobility  ?AM-PAC PT "6 Clicks" Mobility Outcome Measure (Version 2)  ?Help needed turning from your back to your side while in a flat bed without using bedrails? 4  ?Help needed moving from lying on your  back to sitting on the side of a flat bed without using  bedrails? 4  ?Help needed moving to and from a bed to a chair (including a wheelchair)? 4  ?Help needed standing up from a chair using your arms (e.g., wheelchair or bedside chair)? 4  ?Help needed to walk in hospital room? 4  ?Help needed climbing 3-5 steps with a railing?  4  ?6 Click Score 24  ?Consider Recommendation of Discharge To: Home with no services  ?Progressive Mobility  ?What is the highest level of mobility based on the progressive mobility assessment? Level 6 (Walks independently in room and hall) - Balance while walking in room without assist - Complete  ?Activity Ambulated independently in hallway  ?PT Recommendation  ?Follow Up Recommendations No PT follow up  ?Assistance recommended at discharge Intermittent Supervision/Assistance  ?Patient can return home with the following Assist for transportation  ?Functional Status Assessment Patient has had a recent decline in their functional status and demonstrates the ability to make significant improvements in function in a reasonable and predictable amount of time.  ?PT equipment None recommended by PT  ?Acute Rehab PT Goals  ?Patient Stated Goal go home  ?PT Goal Formulation All assessment and education complete, DC therapy  ?PT Time Calculation  ?PT Start Time (ACUTE ONLY) [phone]  ?PT Stop Time (ACUTE ONLY) [phone]  ?PT Time Calculation (min) (ACUTE ONLY) 14 min  ?PT General Charges  ?$$ ACUTE PT VISIT 1 Visit  ?PT Evaluation  ?$PT Eval Low Complexity 1 Low  ?Written Expression  ?Dominant Hand Left  ? ?

## 2022-04-27 NOTE — Progress Notes (Signed)
Occupational Therapy Evaluation ?Patient Details ?Name: Shawn Daniels ?MRN: [id number] ?DOB: 08-Sep-1956 ?Today's Date: 04/27/2022 ? ? ?History of Present Illness Pt is a 66 y.o. M who presents 04/26/2022 for evaluation of claudication now s/p right femoral endarterectomy. Significant PMH: DM1, HTN, HLD, PVD.  ? ?Clinical Impression ?  ?Pt reporting independence at baseline with ADLs and functional mobility, lives with spouse who will assist with IADLs initially at d/c. Pt completing ADLs, transfers, and in room mobility with supervision this session. Educated pt on energy conservation strategies for home, pt verbalized understanding. Pt presenting with impairments listed below, however has no acute OT needs at this time, will s/o. Please reconsult if there is a change in pt status. ?   ? ?Recommendations for follow up therapy are one component of a multi-disciplinary discharge planning process, led by the attending physician.  Recommendations may be updated based on patient status, additional functional criteria and insurance authorization.  ? ?Follow Up Recommendations ? No OT follow up  ?  ?Assistance Recommended at Discharge PRN  ?Patient can return home with the following Assistance with cooking/housework;A little help with bathing/dressing/bathroom ? ?  ?Functional Status Assessment ? Patient has had a recent decline in their functional status and demonstrates the ability to make significant improvements in function in a reasonable and predictable amount of time.  ?Equipment Recommendations ? None recommended by OT;Other (comment) (pt has all necessary DME)  ?  ?Recommendations for Other Services   ? ? ?  ?Precautions / Restrictions Precautions ?Precautions: None ?Restrictions ?Weight Bearing Restrictions: No  ? ?  ? ?Mobility Bed Mobility ?Overal bed mobility: Modified Independent ?  ?  ?  ?  ?  ?  ?  ?  ? ?Transfers ?Overall transfer level: Independent ?  ?  ?  ?  ?  ?  ?  ?  ?  ?  ? ?  ?Balance Overall  balance assessment: No apparent balance deficits (not formally assessed) ?  ?  ?  ?  ?  ?  ?  ?  ?  ?  ?  ?  ?  ?  ?  ?  ?  ?  ?   ? ?ADL either performed or assessed with clinical judgement  ? ?ADL Overall ADL's : At baseline ?  ?  ?  ?  ?  ?  ?  ?  ?  ?  ?  ?  ?  ?  ?  ?  ?  ?  ?  ?General ADL Comments: pt set up A for UB/LB dressing and simulated toilet transfer/ambulation in room  ? ? ? ?Vision   ?Vision Assessment?: No apparent visual deficits  ?   ?Perception   ?  ?Praxis   ?  ? ?Pertinent Vitals/Pain Pain Assessment ?Pain Assessment: Faces ?Pain Score: 7  ?Faces Pain Scale: Hurts even more ?Pain Location: incision ?Pain Descriptors / Indicators: Discomfort, Tightness ?Pain Intervention(s): Monitored during session, Limited activity within patient's tolerance  ? ? ? ?Hand Dominance Left ?  ?Extremity/Trunk Assessment Upper Extremity Assessment ?Upper Extremity Assessment: Overall WFL for tasks assessed ?  ?Lower Extremity Assessment ?Lower Extremity Assessment: Defer to PT evaluation ?  ?Cervical / Trunk Assessment ?Cervical / Trunk Assessment: Normal ?  ?Communication Communication ?Communication: No difficulties ?  ?Cognition Arousal/Alertness: Awake/alert ?Behavior During Therapy: [facility] for tasks assessed/performed ?Overall Cognitive Status: Within Functional Limits for tasks assessed ?  ?  ?  ?  ?  ?  ?  ?  ?  ?  ?  ?  ?  ?  ?  ?  ?  ?  ?  ?  General Comments  VSS on RA ? ?  ?Exercises   ?  ?Shoulder Instructions    ? ? ?Home Living Family/patient expects to be discharged to:: Private residence ?Living Arrangements: Spouse/significant other ?Available Help at Discharge: Family ?Type of Home: House ?Home Access: Stairs to enter ?Entrance Stairs-Number of Steps: 2 ?Entrance Stairs-Rails: Left ?Home Layout: One level ?  ?  ?Bathroom Shower/Tub: Walk-in shower ?  ?  ?Bathroom Accessibility: Yes ?  ?Home Equipment: Grab bars - toilet;Grab bars - tub/shower;Shower seat - built in ?  ?  ?  ? ?  ?Prior  Functioning/Environment Prior Level of Function : Independent/Modified Independent ?  ?  ?  ?  ?  ?  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  ? ?  ?  ?OT Problem List: Decreased strength;Decreased activity tolerance ?  ?   ?OT Treatment/Interventions:    ?  ?OT Goals(Current goals can be found in the care plan section) Acute Rehab OT Goals ?Patient Stated Goal: to go home ?OT Goal Formulation: With patient ?Time For Goal Achievement: 05/11/22 ?Potential to Achieve Goals: Good  ?OT Frequency:   ?  ? ?Co-evaluation   ?  ?  ?  ?  ? ?  ?AM-PAC OT "6 Clicks" Daily Activity     ?Outcome Measure Help from another person eating meals?: None ?Help from another person taking care of personal grooming?: None ?Help from another person toileting, which includes using toliet, bedpan, or urinal?: None ?Help from another person bathing (including washing, rinsing, drying)?: A Little ?Help from another person to put on and taking off regular upper body clothing?: None ?Help from another person to put on and taking off regular lower body clothing?: A Little ?6 Click Score: 22 ?  ?End of Session Nurse Communication: Mobility status;Other (comment) (bleeding around IV site, notified pt is dressed) ? ?Activity Tolerance: Patient tolerated treatment well ?Patient left: in bed;with call bell/phone within reach ? ?OT Visit Diagnosis: Muscle weakness (generalized) (M62.81)  ?              ?Time: 3888-2800 ?OT Time Calculation (min): 14 min ?Charges:  OT General Charges ?$OT Visit: 1 Visit ?OT Evaluation ?$OT Eval Low Complexity: 1 Low ? ?Alfonzo Beers, OTD, OTR/L ?Acute Rehab ?[phone] ? ?Mayer Masker ?04/27/2022, 10:27 AM ?

## 2022-04-27 NOTE — Discharge Instructions (Signed)
 [facility]  Discharge instructions  Lower Extremity Bypass Surgery  Please refer to the following instruction for your post-procedure care. Your surgeon or physician assistant will discuss any changes with you.  Activity  You are encouraged to walk as much as you can. You can slowly return to normal activities during the month after your surgery. Avoid strenuous activity and heavy lifting until your doctor tells you it's OK. Avoid activities such as vacuuming or swinging a golf club. Do not drive until your doctor give the OK and you are no longer taking prescription pain medications. It is also normal to have difficulty with sleep habits, eating and bowel movement after surgery. These will go away with time.  Bathing/Showering  You may shower after you go home. Do not soak in a bathtub, hot tub, or swim until the incision heals completely.  Incision Care  Clean your incision with mild soap and water. Shower every day. Pat the area dry with a clean towel. You do not need a bandage unless otherwise instructed. Do not apply any ointments or creams to your incision. If you have open wounds you will be instructed how to care for them or a visiting nurse may be arranged for you. If you have staples or sutures along your incision they will be removed at your post-op appointment. You may have skin glue on your incision. Do not peel it off. It will come off on its own in about one week. If you have a great deal of moisture in your groin, use a gauze help keep this area dry.  Diet  Resume your normal diet. There are no special food restrictions following this procedure. A low fat/ low cholesterol diet is recommended for all patients with vascular disease. In order to heal from your surgery, it is CRITICAL to get adequate nutrition. Your body requires vitamins, minerals, and protein. Vegetables are the best source of vitamins and minerals. Vegetables also provide the  perfect balance of protein. Processed food has little nutritional value, so try to avoid this.  Medications  Resume taking all your medications unless your doctor or nurse practitioner tells you not to. If your incision is causing pain, you may take over-the-counter pain relievers such as acetaminophen (Tylenol). If you were prescribed a stronger pain medication, please aware these medication can cause nausea and constipation. Prevent nausea by taking the medication with a snack or meal. Avoid constipation by drinking plenty of fluids and eating foods with high amount of fiber, such as fruits, vegetables, and grains. Take Colase 100 mg (an over-the-counter stool softener) twice a day as needed for constipation. Do not take Tylenol if you are taking prescription pain medications.  Follow Up  Our office will schedule a follow up appointment 2-3 weeks following discharge.  Please call us immediately for any of the following conditions  Severe or worsening pain in your legs or feet while at rest or while walking Increase pain, redness, warmth, or drainage (pus) from your incision site(s) Fever of 101 degree or higher The swelling in your leg with the bypass suddenly worsens and becomes more painful than when you were in the hospital If you have been instructed to feel your graft pulse then you should do so every day. If you can no longer feel this pulse, call the office immediately. Not all patients are given this instruction.  Leg swelling is common after leg bypass surgery.  The swelling should improve over a few months   following surgery. To improve the swelling, you may elevate your legs above the level of your heart while you are sitting or resting. Your surgeon or physician assistant may ask you to apply an ACE wrap or wear compression (TED) stockings to help to reduce swelling.  Reduce your risk of vascular disease  Stop smoking. If you would like help call [facility] at 1-800-QUIT-NOW  (1-800-784-8669) or [place] at 336-586-4000.  Manage your cholesterol Maintain a desired weight Control your diabetes weight Control your diabetes Keep your blood pressure down  If you have any questions, please call the office at [phone]   

## 2022-04-28 NOTE — Discharge Summary (Signed)
?Discharge Summary ? ?Patient ID: ?Shawn Daniels ?[id number] ?66 y.o. ?[birth year] ? ?Admit date: 04/26/2022 ? ?Discharge date and time: 04/27/2022 11:03 AM  ? ?Admitting Physician: Nada Libman, MD  ? ?Discharge Physician: same ? ?Admission Diagnoses: PAD (peripheral artery disease) (HCC) [I73.9] ? ?Discharge Diagnoses: same ? ?Admission Condition: fair ? ?Discharged Condition: fair ? ?Indication for Admission: Postoperative care ? ?Hospital Course: Shawn Daniels is a 66 year old male who was brought in as an outpatient due to lifestyle limiting claudication of right lower extremity.  He was brought to the operating room on 04/26/2022 and underwent extensive right iliofemoral endarterectomy with bovine pericardial patch angioplasty by Dr. Myra Gianotti.  He tolerated the procedure well and was admitted to the hospital postoperatively.  POD #1 right groin incision was well-appearing and patient was ambulating without difficulty.  He was prescribed 1 to 2 days of narcotic pain medication for continued postoperative pain control.  He will follow-up in office in about 2 to 3 weeks.  He will require a work note to return to work on 05/13/2022 and this was communicated to our office.  He was discharged home in stable condition. ? ?Consults: None ? ?Treatments: surgery: Right iliofemoral endarterectomy with bovine pericardial patch angioplasty by Dr. Myra Gianotti on 04/26/2022 ? ?Discharge Exam: Right foot was warm and well-perfused.  Right groin incision was clean dry and intact ?Vitals:  ? 04/27/22 0448 04/27/22 0811  ?BP: (!) 120/54 129/79  ?Pulse: 71 89  ?Resp: 15 18  ?Temp: 97.8 ?F (36.6 ?C) 97.7 ?F (36.5 ?C)  ?SpO2: 99% 98%  ? ? ? ?Disposition: Discharge disposition: 01-Home or Self Care ? ? ? ? ? ? ?Patient Instructions:  ?Allergies as of 04/27/2022   ? ?   Reactions  ? Codeine Itching  ? Can take in lower doses  ? Keflex [cephalexin] Other (See Comments)  ? Looked older  ? Penicillins Other (See Comments)  ? Childhood   ? ?  ? ?  ?Medication List  ?  ? ?TAKE these medications   ? ?Accu-Chek Guide test strip ?Generic drug: glucose blood ?USE 1 TEST STRIP TWICE DAILY ?  ?ASPIRIN 81 PO ?Take 81 mg by mouth at bedtime. ?  ?clopidogrel 75 MG tablet ?Commonly known as: Plavix ?Take 1 tablet (75 mg total) by mouth daily. ?  ?Glyxambi 25-5 MG Tabs ?Generic drug: Empagliflozin-linaGLIPtin ?Take 1 tablet by mouth daily. ?  ?lisinopril 10 MG tablet ?Commonly known as: ZESTRIL ?Take 10 mg by mouth in the morning and at bedtime. ?  ?Magnesium 400 MG Caps ?Take 400 mg by mouth daily. 400mg  in am 800mg   in pm ?  ?metoprolol succinate 25 MG 24 hr tablet ?Commonly known as: TOPROL-XL ?Take 0.5 tablets (12.5 mg total) by mouth daily. Take with or immediately following a meal. ?  ?NovoLIN 70/30 ReliOn (70-30) 100 UNIT/ML injection ?Generic drug: insulin NPH-regular Human ?25-30 Units 2 (two) times daily with a meal. SLIDING Scale ?  ?omeprazole 20 MG capsule ?Commonly known as: PRILOSEC ?Take 20 mg by mouth daily. ?  ?oxyCODONE-acetaminophen 5-325 MG tablet ?Commonly known as: PERCOCET/ROXICET ?Take 1 tablet by mouth every 6 (six) hours as needed for moderate pain. ?  ?POTASSIUM CHLORIDE PO ?Take 99 mg by mouth daily. ?  ?ReliOn Insulin Syringe 31G X 15/64" 0.5 ML Misc ?Generic drug: Insulin Syringe-Needle U-100 ?USE 1 THREE TIMES DAILY ?  ?rosuvastatin 20 MG tablet ?Commonly known as: CRESTOR ?Take 10 mg by mouth every other day. ?  ? ?  ? ?  Activity: activity as tolerated ?Diet: regular diet ?Wound Care: keep wound clean and dry ? ?Follow-up with [facility] in 2 weeks. ? ?Signed: ?Emilie Rutter, PA-C ?04/28/2022 ?10:11 AM ?[facility] Office: [phone] ? ?

## 2022-04-29 ENCOUNTER — Encounter (HOSPITAL_COMMUNITY): Payer: Self-pay | Admitting: Surgery

## 2022-04-30 ENCOUNTER — Telehealth: Payer: Self-pay | Admitting: Pharmacist

## 2022-04-30 DIAGNOSIS — E782 Mixed hyperlipidemia: Secondary | ICD-10-CM

## 2022-04-30 MED ORDER — REPATHA SURECLICK 140 MG/ML ~~LOC~~ SOAJ: 1.0000 "pen " | SUBCUTANEOUS | 3 refills | Status: DC

## 2022-04-30 NOTE — Telephone Encounter (Signed)
Repatha prior auth approved through 04/29/23. Rx sent to pharmacy. Pt qualifies for copay card. Left message for pt, also sent message in MyChart. ?

## 2022-04-30 NOTE — Telephone Encounter (Signed)
Attempted to contact pt to update on Repatha approval and instructions to utilize copay card. Will need to schedule follow-up labs at the end of June or early July. Left message with callback #. Will send MyChart message with details as well.  ? ?RxBin: [id number] ?RxPCN: [id number] ?RxGrp: [id number] ?ID: [id number] ?

## 2022-04-30 NOTE — Telephone Encounter (Signed)
Pt returned call, f/u labs scheduled. ?

## 2022-05-06 ENCOUNTER — Telehealth: Payer: Self-pay

## 2022-05-06 NOTE — Telephone Encounter (Signed)
Pt called with c/o feeling an area that is firm to touch near incision. He denies any swelling, redness, drainage, warmth. Pt states area is not painful. He noticed it a few days ago and it has not changed. He is going to monitor it over the next few days and will let us know if this persists or worsens. Pt verbalized understanding; no further questions/concerns at this time. ?

## 2022-05-27 ENCOUNTER — Other Ambulatory Visit: Payer: Self-pay | Admitting: Surgery

## 2022-05-27 ENCOUNTER — Ambulatory Visit (INDEPENDENT_AMBULATORY_CARE_PROVIDER_SITE_OTHER): Payer: Managed Care, Other (non HMO) | Admitting: Surgery

## 2022-05-27 DIAGNOSIS — I70211 Atherosclerosis of native arteries of extremities with intermittent claudication, right leg: Secondary | ICD-10-CM

## 2022-05-27 MED ORDER — SULFAMETHOXAZOLE-TRIMETHOPRIM 800-160 MG PO TABS: 1.0000 | ORAL_TABLET | Freq: Two times a day (BID) | ORAL | 0 refills | Status: DC

## 2022-05-27 NOTE — Progress Notes (Signed)
Patient name: Shawn Daniels MRN: [id number] DOB: July 22, 1956 Sex: male  REASON FOR VISIT:    Post op  HISTORY OF PRESENT ILLNESS:   Shawn Daniels is a 66 y.o. male who is status post right iliofemoral endarterectomy with bovine pericardial patch angioplasty on 04/2022 for severe claudication.  Intraoperative findings included a nearly occlusive calcific plaque in the common femoral artery extending up into the distal external iliac artery.  The plaque also extended down onto the superficial femoral artery and profundofemoral artery.  A long patch approximately 8 cm was utilized, starting in the proximal common femoral artery and going down onto the superficial femoral artery for approximately 3 cm.  He has been complaining of soreness in his groin incision at the superior aspect with redness.  HIs claudication symptoms are improved  CURRENT MEDICATIONS:    Current Outpatient Medications  Medication Sig Dispense Refill   ACCU-CHEK GUIDE test strip USE 1 TEST STRIP TWICE DAILY     ASPIRIN 81 PO Take 81 mg by mouth at bedtime.     clopidogrel (PLAVIX) 75 MG tablet Take 1 tablet (75 mg total) by mouth daily. 90 tablet 1   Evolocumab (REPATHA SURECLICK) 140 MG/ML SOAJ Inject 1 pen. into the skin every 14 (fourteen) days. 6 mL 3   GLYXAMBI 25-5 MG TABS Take 1 tablet by mouth daily.     lisinopril (PRINIVIL,ZESTRIL) 10 MG tablet Take 10 mg by mouth in the morning and at bedtime.     Magnesium 400 MG CAPS Take 400 mg by mouth daily. 400mg  in am 800mg   in pm     metoprolol succinate (TOPROL-XL) 25 MG 24 hr tablet Take 0.5 tablets (12.5 mg total) by mouth daily. Take with or immediately following a meal. 45 tablet 3   NOVOLIN 70/30 RELION (70-30) 100 UNIT/ML injection 25-30 Units 2 (two) times daily with a meal. SLIDING Scale     omeprazole (PRILOSEC) 20 MG capsule Take 20 mg by mouth daily.     oxyCODONE-acetaminophen (PERCOCET/ROXICET) 5-325 MG tablet  Take 1 tablet by mouth every 6 (six) hours as needed for moderate pain. 20 tablet 0   POTASSIUM CHLORIDE PO Take 99 mg by mouth daily.     RELION INSULIN SYRINGE 31G X 15/64" 0.5 ML MISC USE 1 THREE TIMES DAILY     rosuvastatin (CRESTOR) 20 MG tablet Take 10 mg by mouth every other day.     No current facility-administered medications for this visit.    REVIEW OF SYSTEMS:   [X]  denotes positive finding, [ ]  denotes negative finding Cardiac  Comments:  Chest pain or chest pressure:    Shortness of breath upon exertion:    Short of breath when lying flat:    Irregular heart rhythm:    Constitutional    Fever or chills:      PHYSICAL EXAM:   There were no vitals filed for this visit.  GENERAL: The patient is a well-nourished male, in no acute distress. The vital signs are documented above. CARDIOVASCULAR: There is a regular rate and rhythm. PULMONARY: Non-labored respirations Erythema at the superior aspect of the incision with a slight bulge.  I probed this with a q-tip.  It did not go anywhere.  I removed about 10 cc of old blood  STUDIES:   none   MEDICAL ISSUES:   Superficial wound infection.  Will plan on starting abx (Bactrim x 10 days) today and have him follow up in 3 weeks for a wound  check.  He has residual popliteal disease that I hope will not need to be treated, but if it does, I would begin with a endovascular approach  Durene Cal, IV, MD, FACS [facility] [phone] Pager [phone]

## 2022-06-17 ENCOUNTER — Ambulatory Visit: Payer: Managed Care, Other (non HMO) | Admitting: Surgery

## 2022-06-17 ENCOUNTER — Encounter: Payer: Self-pay | Admitting: Surgery

## 2022-06-17 VITALS — BP 131/74 | HR 88 | Temp 97.6°F | Resp 18 | Ht 73.0 in | Wt 208.0 lb

## 2022-06-17 DIAGNOSIS — I70213 Atherosclerosis of native arteries of extremities with intermittent claudication, bilateral legs: Secondary | ICD-10-CM

## 2022-06-18 ENCOUNTER — Encounter: Payer: Self-pay | Admitting: Cardiovascular Disease

## 2022-06-18 ENCOUNTER — Other Ambulatory Visit: Payer: Managed Care, Other (non HMO)

## 2022-06-18 DIAGNOSIS — E782 Mixed hyperlipidemia: Secondary | ICD-10-CM

## 2022-06-18 LAB — LIPID PANEL
Chol/HDL Ratio: 3 ratio (ref 0.0–5.0)
Cholesterol, Total: 188 mg/dL (ref 100–199)
HDL: 62 mg/dL (ref >39)
LDL Chol Calc (NIH): 77 mg/dL (ref 0–99)
Triglycerides: 305 mg/dL — ABNORMAL HIGH (ref 0–149)
VLDL Cholesterol Cal: 49 mg/dL — ABNORMAL HIGH (ref 5–40)

## 2022-06-18 LAB — HEPATIC FUNCTION PANEL
ALT: 30 IU/L (ref 0–44)
AST: 39 IU/L (ref 0–40)
Albumin: 4.8 g/dL (ref 3.8–4.8)
Alkaline Phosphatase: 80 IU/L (ref 44–121)
Bilirubin Total: 0.6 mg/dL (ref 0.0–1.2)
Bilirubin, Direct: 0.17 mg/dL (ref 0.00–0.40)
Total Protein: 7.6 g/dL (ref 6.0–8.5)

## 2022-06-20 ENCOUNTER — Telehealth: Payer: Self-pay

## 2022-06-20 MED ORDER — FENOFIBRATE 145 MG PO TABS: 145.0000 mg | ORAL_TABLET | Freq: Every day | ORAL | 3 refills | Status: DC

## 2022-06-20 NOTE — Telephone Encounter (Signed)
Called and spoke to patient who understands results and is willing to try Fenofibrate. Will send to pharmacy on file at this time. States he will attempt to watch his diet.

## 2022-06-20 NOTE — Telephone Encounter (Signed)
-----   Message from Vesta Mixer, MD sent at 06/18/2022  5:39 PM EDT ----- LDL is 77 .    He is currently on repatha, rosuvastatin 20 mg QOD Trigs are elevated.   Lets add fenofibrate 145 mg a day  He should avoid all excess fatty foods and carbs.

## 2022-07-01 ENCOUNTER — Ambulatory Visit: Payer: Managed Care, Other (non HMO) | Admitting: Family Medicine

## 2022-07-08 ENCOUNTER — Ambulatory Visit: Payer: Managed Care, Other (non HMO) | Admitting: Family Medicine

## 2022-07-08 VITALS — BP 124/62 | Ht 73.0 in | Wt 208.0 lb

## 2022-07-08 DIAGNOSIS — M25522 Pain in left elbow: Secondary | ICD-10-CM

## 2022-07-08 DIAGNOSIS — G8929 Other chronic pain: Secondary | ICD-10-CM | POA: Diagnosis not present

## 2022-07-08 MED ORDER — NITROGLYCERIN 0.2 MG/HR TD PT24: MEDICATED_PATCH | TRANSDERMAL | 1 refills | Status: DC

## 2022-07-08 NOTE — Patient Instructions (Signed)
You have medial epicondylitis (golfer's elbow). Avoid painful activities as much as possible (unless doing home exercises). Tylenol or aleve as needed for pain. Icing 3-4 times a day - careful around ulnar nerve (funny bone nerve) on inside of elbow. Do home exercises daily. Sleeve or counterforce brace may be helpful to unload area of pain while it heals. Nitro patches 1/4th patch to affected area, change daily. Follow up with me in 6 weeks.

## 2022-07-09 ENCOUNTER — Encounter: Payer: Self-pay | Admitting: Family Medicine

## 2022-07-09 NOTE — Progress Notes (Signed)
PCP: Ignatius Specking, MD  Subjective:   HPI: Patient is a 66 y.o. male here for left elbow pain.  Patient reports he's had medial left elbow pain for a couple years but has just dealt with this. 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 No radiation into arm. No numbness or tingling. Tried some of his old nitro patches but no other treatments.  Past Medical History:  Diagnosis Date   Diabetes mellitus without complication (HCC)    GERD (gastroesophageal reflux disease)    Hypertension    Peripheral vascular disease (HCC)     Current Outpatient Medications on File Prior to Visit  Medication Sig Dispense Refill   ACCU-CHEK GUIDE test strip USE 1 TEST STRIP TWICE DAILY     ASPIRIN 81 PO Take 81 mg by mouth at bedtime.     Evolocumab (REPATHA SURECLICK) 140 MG/ML SOAJ Inject 1 pen. into the skin every 14 (fourteen) days. 6 mL 3   fenofibrate (TRICOR) 145 MG tablet Take 1 tablet (145 mg total) by mouth daily. 90 tablet 3   GLYXAMBI 25-5 MG TABS Take 1 tablet by mouth daily.     lisinopril (PRINIVIL,ZESTRIL) 10 MG tablet Take 10 mg by mouth in the morning and at bedtime.     Magnesium 400 MG CAPS Take 400 mg by mouth daily. 400mg  in am 800mg   in pm     metoprolol succinate (TOPROL-XL) 25 MG 24 hr tablet Take 0.5 tablets (12.5 mg total) by mouth daily. Take with or immediately following a meal. 45 tablet 3   NOVOLIN 70/30 RELION (70-30) 100 UNIT/ML injection 25-30 Units 2 (two) times daily with a meal. SLIDING Scale     omeprazole (PRILOSEC) 20 MG capsule Take 20 mg by mouth daily.     oxyCODONE-acetaminophen (PERCOCET/ROXICET) 5-325 MG tablet Take 1 tablet by mouth every 6 (six) hours as needed for moderate pain. 20 tablet 0   POTASSIUM CHLORIDE PO Take 99 mg by mouth daily.     RELION INSULIN SYRINGE 31G X 15/64" 0.5 ML MISC USE 1 THREE TIMES DAILY     rosuvastatin (CRESTOR) 20 MG tablet Take 10 mg by mouth every other day.     sulfamethoxazole-trimethoprim  (BACTRIM DS) 800-160 MG tablet Take 1 tablet by mouth 2 (two) times daily. 20 tablet 0   No current facility-administered medications on file prior to visit.    Past Surgical History:  Procedure Laterality Date   ENDARTERECTOMY FEMORAL Right 04/26/2022   Procedure: RIGHT FEMORAL ENDARTERECTOMY;  Surgeon: 10-02-2000, MD;  Location: [facility] OR;  Service: Vascular;  Laterality: Right;   KNEE SURGERY     LOWER EXTREMITY ANGIOGRAPHY N/A 01/01/2022   Procedure: LOWER EXTREMITY ANGIOGRAPHY;  Surgeon: Nada Libman, MD;  Location: [facility] INVASIVE [facility];  Service: Cardiovascular;  Laterality: N/A;   PATCH ANGIOPLASTY Right 04/26/2022   Procedure: PATCH ANGIOPLASTY OF RIGHT FEMORAL ARTERY USING XENOSURE BOVINE PERCARDIUM PATCH;  Surgeon: Elder Negus, MD;  Location: [facility] OR;  Service: Vascular;  Laterality: Right;   PENILE PROSTHESIS IMPLANT     PERIPHERAL VASCULAR BALLOON ANGIOPLASTY  01/08/2022   Procedure: PERIPHERAL VASCULAR BALLOON ANGIOPLASTY;  Surgeon: Nada Libman, MD;  Location: [facility];  Service: Cardiovascular;;   WRIST SURGERY      Allergies  Allergen Reactions   Codeine Itching    Can take in lower doses   Keflex [Cephalexin] Other (See Comments)    Looked older   Penicillins Other (  See Comments)    Childhood    BP 124/62   Ht 6\' 1"  (1.854 m)   Wt 208 lb (94.3 kg)   BMI 27.44 kg/m       No data to display              No data to display              Objective:  Physical Exam:  Gen: NAD, comfortable in exam room  Left elbow: No deformity, swelling, bruising. FROM with 5/5 strength - mild pain with resisted wrist flexion. Tenderness to palpation medial epicondyle.  No other tenderness. NVI distally. Collateral ligaments intact. Negative tinels cubital tunnel.   Assessment & Plan:  1. Left medial epicondylitis - home exercises reviewed.  Counterforce brace vs sleeve.  Tylenol, nitro patches.  F/u in 6 weeks.

## 2022-07-16 ENCOUNTER — Ambulatory Visit: Payer: Managed Care, Other (non HMO) | Admitting: Student

## 2022-08-05 ENCOUNTER — Other Ambulatory Visit: Payer: Self-pay

## 2022-08-05 ENCOUNTER — Ambulatory Visit: Payer: Managed Care, Other (non HMO) | Admitting: Surgery

## 2022-08-05 ENCOUNTER — Encounter: Payer: Self-pay | Admitting: Surgery

## 2022-08-05 VITALS — BP 117/72 | HR 98 | Temp 98.4°F | Resp 20 | Ht 73.0 in | Wt 206.0 lb

## 2022-08-05 DIAGNOSIS — I70213 Atherosclerosis of native arteries of extremities with intermittent claudication, bilateral legs: Secondary | ICD-10-CM

## 2022-08-05 NOTE — H&P (View-Only) (Signed)
[facility] [place]  Patient name: Shawn Daniels MRN: [id number] DOB: 10/26/1956 Sex: male   REASON FOR VISIT:    Follow up  HISOTRY OF PRESENT ILLNESS:    Shawn Daniels is a 66 y.o. male who is status post right iliofemoral endarterectomy with bovine pericardial patch angioplasty on 04/2022 for severe claudication.  Intraoperative findings included a nearly occlusive calcific plaque in the common femoral artery extending up into the distal external iliac artery.  The plaque also extended down onto the superficial femoral artery and profundofemoral artery.  A long patch approximately 8 cm was utilized, starting in the proximal common femoral artery and going down onto the superficial femoral artery for approximately 3 cm. HIs claudication symptoms are improved.  He has been increasing his activity.  He is still having some limitations.  He is now having bilateral symptoms.  He does have cramps that wake him up at night as well as with activity.  He did have a superficial skin infection that was treated with antibiotics which resolved.  He is back today to talk about future revascularization options given that he has a known popliteal stenosis   PAST MEDICAL HISTORY:   Past Medical History:  Diagnosis Date   Diabetes mellitus without complication (HCC)    GERD (gastroesophageal reflux disease)    Hypertension    Peripheral vascular disease ([place])      FAMILY HISTORY:   Family History  Problem Relation Age of Onset   Heart failure Father     SOCIAL HISTORY:   Social History   Tobacco Use   Smoking status: Former    Packs/day: 1.50    Years: 30.00    Total pack years: 45.00    Types: Cigarettes, Cigars    Quit date: 2009    Years since quitting: 14.6   Smokeless tobacco: Never  Substance Use Topics   Alcohol use: Yes    Alcohol/week: 3.0 standard drinks of alcohol    Types: 3 Cans of beer per week     Comment: daily     ALLERGIES:   Allergies  Allergen Reactions   Codeine Itching    Can take in lower doses   Keflex [Cephalexin] Other (See Comments)    Looked older   Penicillins Other (See Comments)    Childhood     CURRENT MEDICATIONS:   Current Outpatient Medications  Medication Sig Dispense Refill   ACCU-CHEK GUIDE test strip USE 1 TEST STRIP TWICE DAILY     ASPIRIN 81 PO Take 81 mg by mouth at bedtime.     Evolocumab (REPATHA SURECLICK) XX123456 MG/ML SOAJ Inject 1 pen. into the skin every 14 (fourteen) days. 6 mL 3   fenofibrate (TRICOR) 145 MG tablet Take 1 tablet (145 mg total) by mouth daily. 90 tablet 3   GLYXAMBI 25-5 MG TABS Take 1 tablet by mouth daily.     lisinopril (PRINIVIL,ZESTRIL) 10 MG tablet Take 10 mg by mouth in the morning and at bedtime.     Magnesium 400 MG CAPS Take 400 mg by mouth daily. 400mg  in am 800mg   in pm     metoprolol succinate (TOPROL-XL) 25 MG 24 hr tablet Take 0.5 tablets (12.5 mg total) by mouth daily. Take with or immediately following a meal. 45 tablet 3   nitroGLYCERIN (NITRODUR - DOSED IN MG/24 HR) 0.2 mg/hr patch Apply 1/4th patch to affected elbow, change daily 30 patch 1   NOVOLIN 70/30 RELION (70-30) 100 UNIT/ML  injection 25-30 Units 2 (two) times daily with a meal. SLIDING Scale     omeprazole (PRILOSEC) 20 MG capsule Take 20 mg by mouth daily.     oxyCODONE-acetaminophen (PERCOCET/ROXICET) 5-325 MG tablet Take 1 tablet by mouth every 6 (six) hours as needed for moderate pain. 20 tablet 0   POTASSIUM CHLORIDE PO Take 99 mg by mouth daily.     RELION INSULIN SYRINGE 31G X 15/64" 0.5 ML MISC USE 1 THREE TIMES DAILY     rosuvastatin (CRESTOR) 20 MG tablet Take 10 mg by mouth every other day.     sulfamethoxazole-trimethoprim (BACTRIM DS) 800-160 MG tablet Take 1 tablet by mouth 2 (two) times daily. 20 tablet 0   No current facility-administered medications for this visit.    REVIEW OF SYSTEMS:   [X]  denotes positive finding, [ ]   denotes negative finding Cardiac  Comments:  Chest pain or chest pressure:    Shortness of breath upon exertion:    Short of breath when lying flat:    Irregular heart rhythm:        Vascular    Pain in calf, thigh, or hip brought on by ambulation:    Pain in feet at night that wakes you up from your sleep:     Blood clot in your veins:    Leg swelling:         Pulmonary    Oxygen at home:    Productive cough:     Wheezing:         Neurologic    Sudden weakness in arms or legs:     Sudden numbness in arms or legs:     Sudden onset of difficulty speaking or slurred speech:    Temporary loss of vision in one eye:     Problems with dizziness:         Gastrointestinal    Blood in stool:     Vomited blood:         Genitourinary    Burning when urinating:     Blood in urine:        Psychiatric    Major depression:         Hematologic    Bleeding problems:    Problems with blood clotting too easily:        Skin    Rashes or ulcers:        Constitutional    Fever or chills:      PHYSICAL EXAM:   Vitals:   08/05/22 1521  BP: 117/72  Pulse: 98  Resp: 20  Temp: 98.4 F (36.9 C)  SpO2: 92%  Weight: 206 lb (93.4 kg)  Height: 6\' 1"  (1.854 m)    GENERAL: The patient is a well-nourished male, in no acute distress. The vital signs are documented above. CARDIAC: There is a regular rate and rhythm. PULMONARY: Non-labored respirations ABDOMEN: Soft and non-tender  MUSCULOSKELETAL: There are no major deformities or cyanosis. NEUROLOGIC: No focal weakness or paresthesias are detected. SKIN: There are no ulcers or rashes noted. PSYCHIATRIC: The patient has a normal affect.  STUDIES:   None  MEDICAL ISSUES:   PAD with claudication: Initially he got good relief from his right iliofemoral endarterectomy.  He has a known high-grade right popliteal stenosis that has not been treated.  Because he is having recurrent symptoms, we have discussed proceeding with treatment.   This will require stenting and possibly atherectomy.  I also need to get good images of the left  leg as this will likely need to be addressed in the near future.  He will get this scheduled in the immediate future.    Charlena Cross, MD, FACS [facility] [phone] Pager [phone]

## 2022-08-05 NOTE — Progress Notes (Signed)
[facility] [place]  Patient name: Shawn Daniels MRN: [id number] DOB: Jul 07, 1956 Sex: male   REASON FOR VISIT:    Follow up  HISOTRY OF PRESENT ILLNESS:    Shawn Daniels is a 66 y.o. male who is status post right iliofemoral endarterectomy with bovine pericardial patch angioplasty on 04/2022 for severe claudication.  Intraoperative findings included a nearly occlusive calcific plaque in the common femoral artery extending up into the distal external iliac artery.  The plaque also extended down onto the superficial femoral artery and profundofemoral artery.  A long patch approximately 8 cm was utilized, starting in the proximal common femoral artery and going down onto the superficial femoral artery for approximately 3 cm. HIs claudication symptoms are improved.  He has been increasing his activity.  He is still having some limitations.  He is now having bilateral symptoms.  He does have cramps that wake him up at night as well as with activity.  He did have a superficial skin infection that was treated with antibiotics which resolved.  He is back today to talk about future revascularization options given that he has a known popliteal stenosis   PAST MEDICAL HISTORY:   Past Medical History:  Diagnosis Date   Diabetes mellitus without complication (HCC)    GERD (gastroesophageal reflux disease)    Hypertension    Peripheral vascular disease ([place])      FAMILY HISTORY:   Family History  Problem Relation Age of Onset   Heart failure Father     SOCIAL HISTORY:   Social History   Tobacco Use   Smoking status: Former    Packs/day: 1.50    Years: 30.00    Total pack years: 45.00    Types: Cigarettes, Cigars    Quit date: 2009    Years since quitting: 14.6   Smokeless tobacco: Never  Substance Use Topics   Alcohol use: Yes    Alcohol/week: 3.0 standard drinks of alcohol    Types: 3 Cans of beer per week     Comment: daily     ALLERGIES:   Allergies  Allergen Reactions   Codeine Itching    Can take in lower doses   Keflex [Cephalexin] Other (See Comments)    Looked older   Penicillins Other (See Comments)    Childhood     CURRENT MEDICATIONS:   Current Outpatient Medications  Medication Sig Dispense Refill   ACCU-CHEK GUIDE test strip USE 1 TEST STRIP TWICE DAILY     ASPIRIN 81 PO Take 81 mg by mouth at bedtime.     Evolocumab (REPATHA SURECLICK) [id number] MG/ML SOAJ Inject 1 pen. into the skin every 14 (fourteen) days. 6 mL 3   fenofibrate (TRICOR) 145 MG tablet Take 1 tablet (145 mg total) by mouth daily. 90 tablet 3   GLYXAMBI 25-5 MG TABS Take 1 tablet by mouth daily.     lisinopril (PRINIVIL,ZESTRIL) 10 MG tablet Take 10 mg by mouth in the morning and at bedtime.     Magnesium 400 MG CAPS Take 400 mg by mouth daily. 400mg  in am 800mg   in pm     metoprolol succinate (TOPROL-XL) 25 MG 24 hr tablet Take 0.5 tablets (12.5 mg total) by mouth daily. Take with or immediately following a meal. 45 tablet 3   nitroGLYCERIN (NITRODUR - DOSED IN MG/24 HR) 0.2 mg/hr patch Apply 1/4th patch to affected elbow, change daily 30 patch 1   NOVOLIN 70/30 RELION (70-30) 100 UNIT/ML  injection 25-30 Units 2 (two) times daily with a meal. SLIDING Scale     omeprazole (PRILOSEC) 20 MG capsule Take 20 mg by mouth daily.     oxyCODONE-acetaminophen (PERCOCET/ROXICET) 5-325 MG tablet Take 1 tablet by mouth every 6 (six) hours as needed for moderate pain. 20 tablet 0   POTASSIUM CHLORIDE PO Take 99 mg by mouth daily.     RELION INSULIN SYRINGE 31G X 15/64" 0.5 ML MISC USE 1 THREE TIMES DAILY     rosuvastatin (CRESTOR) 20 MG tablet Take 10 mg by mouth every other day.     sulfamethoxazole-trimethoprim (BACTRIM DS) 800-160 MG tablet Take 1 tablet by mouth 2 (two) times daily. 20 tablet 0   No current facility-administered medications for this visit.    REVIEW OF SYSTEMS:   [X]  denotes positive finding, [ ]   denotes negative finding Cardiac  Comments:  Chest pain or chest pressure:    Shortness of breath upon exertion:    Short of breath when lying flat:    Irregular heart rhythm:        Vascular    Pain in calf, thigh, or hip brought on by ambulation:    Pain in feet at night that wakes you up from your sleep:     Blood clot in your veins:    Leg swelling:         Pulmonary    Oxygen at home:    Productive cough:     Wheezing:         Neurologic    Sudden weakness in arms or legs:     Sudden numbness in arms or legs:     Sudden onset of difficulty speaking or slurred speech:    Temporary loss of vision in one eye:     Problems with dizziness:         Gastrointestinal    Blood in stool:     Vomited blood:         Genitourinary    Burning when urinating:     Blood in urine:        Psychiatric    Major depression:         Hematologic    Bleeding problems:    Problems with blood clotting too easily:        Skin    Rashes or ulcers:        Constitutional    Fever or chills:      PHYSICAL EXAM:   Vitals:   08/05/22 1521  BP: 117/72  Pulse: 98  Resp: 20  Temp: 98.4 F (36.9 C)  SpO2: 92%  Weight: 206 lb (93.4 kg)  Height: 6\' 1"  (1.854 m)    GENERAL: The patient is a well-nourished male, in no acute distress. The vital signs are documented above. CARDIAC: There is a regular rate and rhythm. PULMONARY: Non-labored respirations ABDOMEN: Soft and non-tender  MUSCULOSKELETAL: There are no major deformities or cyanosis. NEUROLOGIC: No focal weakness or paresthesias are detected. SKIN: There are no ulcers or rashes noted. PSYCHIATRIC: The patient has a normal affect.  STUDIES:   None  MEDICAL ISSUES:   PAD with claudication: Initially he got good relief from his right iliofemoral endarterectomy.  He has a known high-grade right popliteal stenosis that has not been treated.  Because he is having recurrent symptoms, we have discussed proceeding with treatment.   This will require stenting and possibly atherectomy.  I also need to get good images of the left  leg as this will likely need to be addressed in the near future.  He will get this scheduled in the immediate future.    Charlena Cross, MD, FACS [facility] [phone] Pager [phone]

## 2022-08-16 ENCOUNTER — Other Ambulatory Visit: Payer: Managed Care, Other (non HMO)

## 2022-08-19 ENCOUNTER — Ambulatory Visit: Payer: Managed Care, Other (non HMO) | Admitting: Family Medicine

## 2022-08-20 ENCOUNTER — Other Ambulatory Visit: Payer: Self-pay

## 2022-08-20 ENCOUNTER — Ambulatory Visit (HOSPITAL_COMMUNITY)
Admission: RE | Admit: 2022-08-20 | Discharge: 2022-08-20 | Disposition: A | Discharge status: Home / Self Care | Payer: Managed Care, Other (non HMO) | Attending: Surgery | Admitting: Surgery

## 2022-08-20 ENCOUNTER — Encounter (HOSPITAL_COMMUNITY)
Admission: RE | Disposition: A | Discharge status: Home / Self Care | Payer: Self-pay | Source: Home / Self Care | Attending: Surgery

## 2022-08-20 DIAGNOSIS — I70211 Atherosclerosis of native arteries of extremities with intermittent claudication, right leg: Secondary | ICD-10-CM | POA: Insufficient documentation

## 2022-08-20 DIAGNOSIS — Z87891 Personal history of nicotine dependence: Secondary | ICD-10-CM | POA: Insufficient documentation

## 2022-08-20 DIAGNOSIS — I70213 Atherosclerosis of native arteries of extremities with intermittent claudication, bilateral legs: Secondary | ICD-10-CM

## 2022-08-20 HISTORY — PX: ABDOMINAL AORTOGRAM W/LOWER EXTREMITY: CATH118223

## 2022-08-20 HISTORY — PX: PERIPHERAL VASCULAR ATHERECTOMY: CATH118256

## 2022-08-20 LAB — POCT I-STAT, CHEM 8
BUN: 19 mg/dL (ref 8–23)
Calcium, Ion: 1.12 mmol/L — ABNORMAL LOW (ref 1.15–1.40)
Chloride: 104 mmol/L (ref 98–111)
Creatinine, Ser: 0.8 mg/dL (ref 0.61–1.24)
Glucose, Bld: 134 mg/dL — ABNORMAL HIGH (ref 70–99)
HCT: 41 % (ref 39.0–52.0)
Hemoglobin: 13.9 g/dL (ref 13.0–17.0)
Potassium: 5.5 mmol/L — ABNORMAL HIGH (ref 3.5–5.1)
Sodium: 138 mmol/L (ref 135–145)
TCO2: 26 mmol/L (ref 22–32)

## 2022-08-20 LAB — POCT ACTIVATED CLOTTING TIME
Activated Clotting Time: 215 seconds
Activated Clotting Time: 167 seconds

## 2022-08-20 SURGERY — ABDOMINAL AORTOGRAM W/LOWER EXTREMITY
Anesthesia: LOCAL | Laterality: Right

## 2022-08-20 MED ORDER — SODIUM CHLORIDE 0.9 % IV SOLN: INTRAVENOUS | Status: DC

## 2022-08-20 MED ORDER — SODIUM CHLORIDE 0.9% FLUSH
3.0000 mL | INTRAVENOUS | Status: DC | PRN
Start: 2022-08-20 — End: 2022-08-20

## 2022-08-20 MED ORDER — FENTANYL CITRATE (PF) 100 MCG/2ML IJ SOLN
INTRAMUSCULAR | Status: DC | PRN
  Administered 2022-08-20 (×2): 25 ug via INTRAVENOUS
  Administered 2022-08-20: 50 ug via INTRAVENOUS

## 2022-08-20 MED ORDER — HEPARIN SODIUM (PORCINE) 1000 UNIT/ML IJ SOLN
INTRAMUSCULAR | Status: AC
  Filled 2022-08-20: qty 10

## 2022-08-20 MED ORDER — NITROGLYCERIN IN D5W 200-5 MCG/ML-% IV SOLN
INTRAVENOUS | Status: AC
  Filled 2022-08-20: qty 250

## 2022-08-20 MED ORDER — NITROGLYCERIN 1 MG/10 ML FOR IR/CATH LAB
INTRA_ARTERIAL | Status: AC
  Filled 2022-08-20: qty 10

## 2022-08-20 MED ORDER — MIDAZOLAM HCL 2 MG/2ML IJ SOLN
INTRAMUSCULAR | Status: AC
  Filled 2022-08-20: qty 2

## 2022-08-20 MED ORDER — HEPARIN (PORCINE) IN NACL 1000-0.9 UT/500ML-% IV SOLN
INTRAVENOUS | Status: DC | PRN
  Administered 2022-08-20 (×2): 500 mL

## 2022-08-20 MED ORDER — SODIUM CHLORIDE 0.9 % IV SOLN: 250.0000 mL | INTRAVENOUS | Status: DC | PRN

## 2022-08-20 MED ORDER — FENTANYL CITRATE (PF) 100 MCG/2ML IJ SOLN
INTRAMUSCULAR | Status: AC
  Filled 2022-08-20: qty 2

## 2022-08-20 MED ORDER — VIPERSLIDE LUBRICANT OPTIME
TOPICAL | Status: DC | PRN
  Administered 2022-08-20: 200 mL via SURGICAL_CAVITY

## 2022-08-20 MED ORDER — LIDOCAINE HCL (PF) 1 % IJ SOLN
INTRAMUSCULAR | Status: DC | PRN
  Administered 2022-08-20: 18 mL

## 2022-08-20 MED ORDER — MIDAZOLAM HCL 2 MG/2ML IJ SOLN
INTRAMUSCULAR | Status: DC | PRN
  Administered 2022-08-20: 1 mg via INTRAVENOUS
  Administered 2022-08-20: 2 mg via INTRAVENOUS
  Administered 2022-08-20: 1 mg via INTRAVENOUS

## 2022-08-20 MED ORDER — ONDANSETRON HCL 4 MG/2ML IJ SOLN: 4.0000 mg | Freq: Four times a day (QID) | INTRAMUSCULAR | Status: DC | PRN

## 2022-08-20 MED ORDER — ACETAMINOPHEN 325 MG PO TABS
650.0000 mg | ORAL_TABLET | ORAL | Status: DC | PRN
Start: 2022-08-20 — End: 2022-08-20

## 2022-08-20 MED ORDER — HYDROMORPHONE HCL 1 MG/ML IJ SOLN: 0.5000 mg | INTRAMUSCULAR | Status: DC | PRN

## 2022-08-20 MED ORDER — LABETALOL HCL 5 MG/ML IV SOLN: 10.0000 mg | INTRAVENOUS | Status: DC | PRN

## 2022-08-20 MED ORDER — HEPARIN (PORCINE) IN NACL 1000-0.9 UT/500ML-% IV SOLN
INTRAVENOUS | Status: AC
  Filled 2022-08-20: qty 1000

## 2022-08-20 MED ORDER — CLOPIDOGREL BISULFATE 75 MG PO TABS: 75.0000 mg | ORAL_TABLET | Freq: Every day | ORAL | Status: DC

## 2022-08-20 MED ORDER — HYDRALAZINE HCL 20 MG/ML IJ SOLN: 5.0000 mg | INTRAMUSCULAR | Status: DC | PRN

## 2022-08-20 MED ORDER — OXYCODONE HCL 5 MG PO TABS: 5.0000 mg | ORAL_TABLET | ORAL | Status: DC | PRN

## 2022-08-20 MED ORDER — ASPIRIN 81 MG PO TBEC
81.0000 mg | DELAYED_RELEASE_TABLET | Freq: Every day | ORAL | Status: DC
  Administered 2022-08-20: 81 mg via ORAL
  Filled 2022-08-20: qty 1

## 2022-08-20 MED ORDER — SODIUM CHLORIDE 0.9% FLUSH: 3.0000 mL | Freq: Two times a day (BID) | INTRAVENOUS | Status: DC

## 2022-08-20 MED ORDER — LIDOCAINE HCL (PF) 1 % IJ SOLN
INTRAMUSCULAR | Status: AC
  Filled 2022-08-20: qty 30

## 2022-08-20 MED ORDER — HEPARIN SODIUM (PORCINE) 1000 UNIT/ML IJ SOLN
INTRAMUSCULAR | Status: DC | PRN
  Administered 2022-08-20: 9000 [IU] via INTRAVENOUS

## 2022-08-20 MED ORDER — SODIUM CHLORIDE 0.9 % WEIGHT BASED INFUSION: 1.0000 mL/kg/h | INTRAVENOUS | Status: DC

## 2022-08-20 SURGICAL SUPPLY — 28 items
BAG SNAP BAND KOVER 36X36 (MISCELLANEOUS) IMPLANT
BALLN COYOTE ES OTW 4X40X145 (BALLOONS) ×2
BALLN IN.PACT DCB 4X40 (BALLOONS) ×2
BALLN STERLING OTW 5X40X135 (BALLOONS) ×2
BALLOON COYOTE ES OTW 4X40X145 (BALLOONS) IMPLANT
BALLOON STERLING OTW 5X40X135 (BALLOONS) IMPLANT
CATH OMNI FLUSH 5F 65CM (CATHETERS) IMPLANT
CATH QUICKCROSS .035X135CM (MICROCATHETER) IMPLANT
COVER DOME SNAP 22 D (MISCELLANEOUS) IMPLANT
DCB IN.PACT 4X40 (BALLOONS) IMPLANT
DEVICE EMBOSHIELD NAV6 4.0-7.0 (FILTER) IMPLANT
DIAMONDBACK SOLID OAS 1.5MM (CATHETERS) ×2
GUIDEWIRE ANGLED .035X260CM (WIRE) IMPLANT
KIT ENCORE 26 ADVANTAGE (KITS) IMPLANT
KIT MICROPUNCTURE NIT STIFF (SHEATH) IMPLANT
KIT PV (KITS) ×3 IMPLANT
SHEATH FLEX ANSEL ANG 6F 45CM (SHEATH) IMPLANT
SHEATH PINNACLE 5F 10CM (SHEATH) IMPLANT
SHIELD RADPAD SCOOP 12X17 (MISCELLANEOUS) IMPLANT
STENT ELUVIA 6X40X130 (Permanent Stent) IMPLANT
SYR MEDRAD MARK V 150ML (SYRINGE) IMPLANT
SYSTEM DIMNDBCK SLD OAS 1.5MM (CATHETERS) IMPLANT
TRANSDUCER W/STOPCOCK (MISCELLANEOUS) ×3 IMPLANT
TRAY PV CATH (CUSTOM PROCEDURE TRAY) ×3 IMPLANT
WIRE BENTSON .035X145CM (WIRE) IMPLANT
WIRE MICROINTRODUCER 60CM (WIRE) IMPLANT
WIRE TORQFLEX AUST .018X40CM (WIRE) IMPLANT
WIRE VIPER ADVANCE .017X335CM (WIRE) IMPLANT

## 2022-08-20 NOTE — Op Note (Signed)
Patient name: Shawn Daniels MRN: [id number] DOB: [birth year] Sex: male  08/20/2022 Pre-operative Diagnosis: Right leg claudication Post-operative diagnosis:  Same Surgeon:  Durene Cal Procedure Performed:  1.  Ultrasound-guided access, left femoral artery  2.  Abdominal aortogram  3.  Right lower extremity runoff  4.  Orbital atherectomy, right popliteal artery  5.  Drug-coated balloon angioplasty, right popliteal artery  6.  Stent, right popliteal artery  7.  Conscious sedation, 107 minutes   Indications: This is a 66 year old gentleman with severe right leg claudication.  He has previously undergone femoral endarterectomy and has a known popliteal stenosis behind the knee.  He is here for further evaluation and possible intervention  Procedure:  The patient was identified in the holding area and taken to room 8.  The patient was then placed supine on the table and prepped and draped in the usual sterile fashion.  A time out was called.  Conscious sedation was administered with the use of IV fentanyl and Versed under continuous physician and nurse monitoring.  Heart rate, blood pressure, and oxygen saturation were continuously monitored.  Total sedation time was 104 minutes.  Ultrasound was used to evaluate the left common femoral artery.  It was patent .  A digital ultrasound image was acquired.  A micropuncture needle was used to access the right common femoral artery under ultrasound guidance.  An 018 wire was advanced without resistance and a micropuncture sheath was placed.  The 018 wire was removed and a benson wire was placed.  The micropuncture sheath was exchanged for a 5 french sheath.  An omniflush catheter was advanced over the wire to the level of L-1.  An abdominal angiogram was obtained.  Next, using the omniflush catheter and a benson wire, the aortic bifurcation was crossed and the catheter was placed into theright external iliac artery and right runoff was obtained.     Findings:   Aortogram: No significant renal artery stenosis.  The infrarenal abdominal aorta is widely patent.  Bilateral common and external iliac arteries are widely patent.  Right Lower Extremity: No stenosis is visualized within the right common femoral profundofemoral artery.  The superficial femoral artery is patent throughout its course with mild disease.  There is a nearly occlusive heavily calcified lesion within the popliteal artery behind the patella with reconstitution of the below-knee popliteal artery.  There is two-vessel runoff via the anterior tibial and peroneal artery.  There is reconstitution of the posterior tibial in the mid calf.  Left Lower Extremity: Not evaluated  Intervention: After the above images were acquired the decision was made to proceed with intervention.  A 6 French 45 cm sheath was inserted.  The patient was fully heparinized.  Using an 035 Glidewire and a quick cross catheter, the lesion was successfully crossed.  A 014 Viper wire was then placed to the quick cross catheter followed by a large NAV 6 filter.  Orbital atherectomy using a CSI 1.5 classic device was performed at low medium and high speeds.  I performed 2 passes at each speed.  I then proceeded with drug-coated balloon angioplasty using a Medtronic 4 x 40 drug-coated balloon.  This was taken to nominal pressure for 3 minutes.  Completion imaging revealed inline flow through the artery however there was still residual stenosis which I felt needed to be stented.  I then used a 6 x 40 Elluvia stent and deployed it across the lesion which was postdilated with a 5 mm balloon.  Completion imaging showed resolution of the stenosis with no change in runoff.  Catheters and wires were removed.  The filter was retrieved in its entirety.  The sheath was withdrawn to the left side and patient be taken holding it for sheath pull once coagulation profile corrects  Impression:  #1  Nearly occlusive heavily calcified  lesion within the right popliteal artery behind the patella successfully treated using a orbital atherectomy CSI 1.5 classic device followed by 4 mm drug-coated balloon angioplasty and ultimate 6 x 40 drug-coated stent placement.  #2 two-vessel runoff via the anterior tibial and peroneal artery.  The posterior tibial does reconstitute in the mid calf and across the ankle.    Juleen China, M.D., [facility] [place] Office: [phone] Pager:  [phone]

## 2022-08-20 NOTE — Progress Notes (Signed)
Site: Left femoral artery Sheath Size: 21fr Condition prior to removal:  Level 0 Type of pressure held: manual Time pressure held: 20 minutes Status of patient during pull:  stable Condition of site post pull:  Level 0 Type of dressing applied: gauze w/ transparent Pulses verified: doppler bilateral pedal Patient's condition post pull: stable Bedrest begins at:  1320 Post instructions given to patient: yes  Pulled by Tivis Ringer

## 2022-08-20 NOTE — Progress Notes (Signed)
Upon arrival to [facility] pt checked BS with own glucose monitor-reads 189

## 2022-08-20 NOTE — Interval H&P Note (Signed)
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  The various methods of treatment have been discussed with the patient and family. After consideration of risks, benefits and other options for treatment, the patient has consented to  Procedure(s): ABDOMINAL AORTOGRAM W/LOWER EXTREMITY (N/A) as a surgical intervention.  The patient's history has been reviewed, patient examined, no change in status, stable for surgery.  I have reviewed the patient's chart and labs.  Questions were answered to the patient's satisfaction.     Durene Cal

## 2022-08-21 ENCOUNTER — Encounter (HOSPITAL_COMMUNITY): Payer: Self-pay | Admitting: Surgery

## 2022-08-27 LAB — POCT ACTIVATED CLOTTING TIME: Activated Clotting Time: 251 seconds

## 2022-09-06 ENCOUNTER — Other Ambulatory Visit: Payer: Self-pay | Admitting: Family Medicine

## 2022-09-11 ENCOUNTER — Other Ambulatory Visit: Payer: Self-pay

## 2022-09-11 MED ORDER — METOPROLOL SUCCINATE ER 25 MG PO TB24: 12.5000 mg | ORAL_TABLET | Freq: Every day | ORAL | 0 refills | Status: DC

## 2022-09-16 ENCOUNTER — Other Ambulatory Visit: Payer: Self-pay | Admitting: *Deleted

## 2022-09-16 DIAGNOSIS — I70213 Atherosclerosis of native arteries of extremities with intermittent claudication, bilateral legs: Secondary | ICD-10-CM

## 2022-09-16 DIAGNOSIS — I739 Peripheral vascular disease, unspecified: Secondary | ICD-10-CM

## 2022-09-17 ENCOUNTER — Encounter (HOSPITAL_COMMUNITY): Payer: Self-pay | Admitting: Surgery

## 2022-09-17 MED ORDER — IODIXANOL 320 MG/ML IV SOLN
INTRAVENOUS | Status: AC | PRN
  Administered 2022-08-20: 120 mL via INTRA_ARTERIAL

## 2022-09-23 ENCOUNTER — Ambulatory Visit (HOSPITAL_COMMUNITY)
Admission: RE | Admit: 2022-09-23 | Discharge: 2022-09-23 | Disposition: A | Discharge status: Home / Self Care | Payer: Managed Care, Other (non HMO) | Source: Ambulatory Visit | Attending: Surgery | Admitting: Surgery

## 2022-09-23 ENCOUNTER — Ambulatory Visit (INDEPENDENT_AMBULATORY_CARE_PROVIDER_SITE_OTHER)
Admission: RE | Admit: 2022-09-23 | Discharge: 2022-09-23 | Disposition: A | Discharge status: Home / Self Care | Payer: Managed Care, Other (non HMO) | Source: Ambulatory Visit | Attending: Surgery | Admitting: Surgery

## 2022-09-23 ENCOUNTER — Ambulatory Visit (INDEPENDENT_AMBULATORY_CARE_PROVIDER_SITE_OTHER): Payer: Managed Care, Other (non HMO) | Admitting: Physician Assistant

## 2022-09-23 VITALS — BP 135/75 | HR 85 | Temp 97.9°F | Resp 16 | Ht 73.0 in | Wt 203.0 lb

## 2022-09-23 DIAGNOSIS — I70213 Atherosclerosis of native arteries of extremities with intermittent claudication, bilateral legs: Secondary | ICD-10-CM

## 2022-09-23 DIAGNOSIS — I739 Peripheral vascular disease, unspecified: Secondary | ICD-10-CM | POA: Insufficient documentation

## 2022-09-23 NOTE — Progress Notes (Signed)
Office Note     CC:  follow up Requesting Provider:  Glenda Chroman, MD  HPI: Shawn Daniels is a 66 y.o. (1956-12-19) male who presents for follow up of of PAD. He is s/p #1: Right iliofemoral endarterectomy with bovine pericardial patch angioplasty 04/26/22 by Dr. Trula Slade. This was performed secondary to lifestyle limiting claudication. He was last seen in August by Dr. Trula Slade at which time he was having some BLE claudication but not disabling. No rest pain or tissue loss. He was increasing his exercise tolerance. He had known popliteal stenosis so secondary to having continued symptoms he was set up for an Angiogram. On 08/20/22 he underwent Aortogram, Arteriogram RLE with atherectomy and DCB angioplasty of the right popliteal artery and stenting of the right popliteal artery by Dr. Trula Slade.   He follows up today after his Arteriogram. He reports that his claudication pain is completely resolved in the right leg. He still has cramping and pain in left calf on ambulation. This is relieved with rest. He has no pain at rest. No tissue loss. He remains very active. He is compliant with his Aspirin, statin, Plavix.   The pt is on a statin for cholesterol management. He is on Repatha The pt is on a daily aspirin.   Other AC:  Plavix The pt is on ACE, BB for hypertension.   The pt is diabetic.   Tobacco KT:GYBWLS, quit 2009  Past Medical History:  Diagnosis Date   Diabetes mellitus without complication (HCC)    GERD (gastroesophageal reflux disease)    Hypertension    Peripheral vascular disease [facility])     Past Surgical History:  Procedure Laterality Date   ABDOMINAL AORTOGRAM W/LOWER EXTREMITY Right 08/20/2022   Procedure: ABDOMINAL AORTOGRAM W/LOWER EXTREMITY;  Surgeon: Serafina Mitchell, MD;  Location: [place] [facility];  Service: Cardiovascular;  Laterality: Right;   ENDARTERECTOMY FEMORAL Right 04/26/2022   Procedure: RIGHT FEMORAL ENDARTERECTOMY;  Surgeon: Serafina Mitchell, MD;   [facility];  Service: Vascular;  Laterality: Right;   KNEE SURGERY     LOWER EXTREMITY ANGIOGRAPHY N/A 01/01/2022   Procedure: LOWER EXTREMITY ANGIOGRAPHY;  Surgeon: Nigel Mormon, MD;  Location: [place] [facility];  Service: Cardiovascular;  Laterality: N/A;   PATCH ANGIOPLASTY Right 04/26/2022   Procedure: PATCH ANGIOPLASTY OF RIGHT FEMORAL ARTERY USING XENOSURE BOVINE [place];  Surgeon: Serafina Mitchell, MD;  [facility];  Service: Vascular;  Laterality: Right;   PENILE PROSTHESIS IMPLANT     PERIPHERAL VASCULAR ATHERECTOMY  08/20/2022   Procedure: PERIPHERAL VASCULAR ATHERECTOMY;  Surgeon: Serafina Mitchell, MD;  Location: [place] [facility];  Service: Cardiovascular;;  popliteal   PERIPHERAL VASCULAR BALLOON ANGIOPLASTY  01/08/2022   Procedure: PERIPHERAL VASCULAR BALLOON ANGIOPLASTY;  Surgeon: Adrian Prows, MD;  Location: [place] [facility];  Service: Cardiovascular;;   WRIST SURGERY      Social History   Socioeconomic History   Marital status: Married    Spouse name: Not on file   Number of children: 0   Years of education: Not on file   Highest education level: Not on file  Occupational History   Not on file  Tobacco Use   Smoking status: Former    Packs/day: 1.50    Years: 30.00    Total pack years: 45.00    Types: Cigarettes, Cigars    Quit date: 2009    Years since quitting: 14.7   Smokeless tobacco: Never  Vaping Use  Vaping Use: Never used  Substance and Sexual Activity   Alcohol use: Yes    Alcohol/week: 3.0 standard drinks of alcohol    Types: 3 Cans of beer per week    Comment: daily   Drug use: Never   Sexual activity: Not on file  Other Topics Concern   Not on file  Social History Narrative   Not on file   Social Determinants of Health   Financial Resource Strain: Not on file  Food Insecurity: Not on file  Transportation Needs: Not on file  Physical Activity: Not on file  Stress: Not on file  Social Connections: Not on file   Intimate Partner Violence: Not on file    Family History  Problem Relation Age of Onset   Heart failure Father     Current Outpatient Medications  Medication Sig Dispense Refill   ACCU-CHEK GUIDE test strip USE 1 TEST STRIP TWICE DAILY     aspirin EC 81 MG tablet Take 81 mg by mouth at bedtime.     clopidogrel (PLAVIX) 75 MG tablet Take 75 mg by mouth daily.     Evolocumab (REPATHA SURECLICK) 140 MG/ML SOAJ Inject 1 pen. into the skin every 14 (fourteen) days. 6 mL 3   fenofibrate (TRICOR) 145 MG tablet Take 1 tablet (145 mg total) by mouth daily. 90 tablet 3   GLYXAMBI 25-5 MG TABS Take 1 tablet by mouth daily.     lisinopril (PRINIVIL,ZESTRIL) 10 MG tablet Take 10 mg by mouth in the morning and at bedtime.     Magnesium 400 MG CAPS Take 400-800 mg by mouth See admin instructions. 400mg  in am 800mg   in pm     metoprolol succinate (TOPROL-XL) 25 MG 24 hr tablet Take 0.5 tablets (12.5 mg total) by mouth daily. Take with or immediately following a meal. 45 tablet 0   Multiple Vitamins-Minerals (MULTIVITAMIN WITH MINERALS) tablet Take 1 tablet by mouth daily.     nitroGLYCERIN (NITRODUR - DOSED IN MG/24 HR) 0.2 mg/hr patch APPLY 1/4 (ONE-FOURTH) PATCH TO AFFECTED ELBOW, CHANGE DAILY 30 patch 1   NOVOLIN 70/30 RELION (70-30) 100 UNIT/ML injection Inject 30 Units into the skin 2 (two) times daily with a meal. SLIDING Scale     omeprazole (PRILOSEC) 20 MG capsule Take 20 mg by mouth daily.     Potassium 99 MG TABS Take 99 mg by mouth daily.     RELION INSULIN SYRINGE 31G X 15/64" 0.5 ML MISC USE 1 THREE TIMES DAILY     rosuvastatin (CRESTOR) 10 MG tablet Take 10 mg by mouth every Wednesday.     oxyCODONE-acetaminophen (PERCOCET/ROXICET) 5-325 MG tablet Take 1 tablet by mouth every 6 (six) hours as needed for moderate pain. (Patient not taking: Reported on 09/23/2022) 20 tablet 0   No current facility-administered medications for this visit.   Facility-Administered Medications Ordered in  Other Visits  Medication Dose Route Frequency Provider Last Rate Last Admin   iodixanol (VISIPAQUE) 320 MG/ML injection    PRN Tuesday, MD   120 mL at 08/20/22 1145    Allergies  Allergen Reactions   Codeine Itching    Can take in lower doses   Keflex [Cephalexin] Other (See Comments)    Looked older   Penicillins Other (See Comments)    Childhood     REVIEW OF SYSTEMS:   [X]  denotes positive finding, [ ]  denotes negative finding Cardiac  Comments:  Chest pain or chest pressure:    Shortness of  breath upon exertion:    Short of breath when lying flat:    Irregular heart rhythm:        Vascular    Pain in calf, thigh, or hip brought on by ambulation:    Pain in feet at night that wakes you up from your sleep:     Blood clot in your veins:    Leg swelling:         Pulmonary    Oxygen at home:    Productive cough:     Wheezing:         Neurologic    Sudden weakness in arms or legs:     Sudden numbness in arms or legs:     Sudden onset of difficulty speaking or slurred speech:    Temporary loss of vision in one eye:     Problems with dizziness:         Gastrointestinal    Blood in stool:     Vomited blood:         Genitourinary    Burning when urinating:     Blood in urine:        Psychiatric    Major depression:         Hematologic    Bleeding problems:    Problems with blood clotting too easily:        Skin    Rashes or ulcers:        Constitutional    Fever or chills:      PHYSICAL EXAMINATION:  Vitals:   09/23/22 1538  BP: 135/75  Pulse: 85  Resp: 16  Temp: 97.9 F (36.6 C)  TempSrc: Temporal  SpO2: 95%  Weight: 203 lb (92.1 kg)  Height: 6\' 1"  (1.854 m)    General:  WDWN in NAD; vital signs documented above Gait: Normal HENT: WNL, normocephalic Pulmonary: normal non-labored breathing , without wheezing Cardiac: regular HR Vascular Exam/Pulses:2+ right Dp, 1+ left Dp. Bilateral feet warm and well perfused. Chronic venous  stasis changes with reticular veins and hemosiderin staining of left > right distal leg. Mild edema R> L Extremities: without ischemic changes, without Gangrene , without cellulitis; without open wounds Musculoskeletal: no muscle wasting or atrophy  Neurologic: A&O X 3;  No focal weakness or paresthesias are detected Psychiatric:  The pt has Normal affect.   Non-Invasive Vascular Imaging:  09/23/22 +-------+-----------+-----------+------------+------------+  ABI/TBIToday's ABIToday's TBIPrevious ABIPrevious TBI  +-------+-----------+-----------+------------+------------+  Right  1.06       0.62       0.74        0.38          +-------+-----------+-----------+------------+------------+  Left   0.74       0.57       0.73        0.46          +-------+-----------+-----------+------------+------------+   VAS 11/23/22 Lower extremity Arterial Duplex:  Summary: Right: Patent stent with no evidence of stenosis in the Popliteal artery.   ASSESSMENT/PLAN:: 66 y.o. male here for follow up for PAD. On 08/20/22 he underwent Aortogram, Arteriogram RLE with atherectomy and DCB angioplasty of the right popliteal artery and stenting of the right popliteal artery by Dr. 08/22/22. This was performed due to recurrent claudication following right iliofemoral endarterectomy with bovine pericardial patch angioplasty 04/26/22. - His ABI today shows significant improvement on the right leg following intervention. The left ABI is stable - Duplex shows patent right lower extremity and popliteal artery stent - Continue  Aspirin, statin, Plavix - Encourage continued exercise regimen - He has known left popliteal occlusion. He would like me to discuss with Dr. Myra Gianotti timing of intervention on left leg since he is having claudication on this leg. I will discuss this with Dr. Myra Gianotti and arrange appropriate follow up. For now he can follow up as needed   Graceann Congress, PA-C [facility] [phone]  Clinic MD:   Myra Gianotti

## 2022-09-30 ENCOUNTER — Telehealth: Payer: Self-pay

## 2022-09-30 DIAGNOSIS — I739 Peripheral vascular disease, unspecified: Secondary | ICD-10-CM

## 2022-09-30 DIAGNOSIS — I70213 Atherosclerosis of native arteries of extremities with intermittent claudication, bilateral legs: Secondary | ICD-10-CM

## 2022-09-30 NOTE — Telephone Encounter (Signed)
Pt called stating that he has a 100% blockage in his L leg and needs an intervention.  Reviewed pt's chart, spoke with Corrie PA who had spoken with Dr Trula Slade to aid in [place], returned call for clarification, two identifiers used. Pt had discussed transitioning to Medicare for insurance and wanted to wait until the surgery would be covered. An additional [place] would be needed before determination of possible intervention.  Pt called and wants to proceed with intervention because he is now limping with the foot pain. Confirmed with Corrie, PA on an [place]. Called pt, no answer, appts for [place] and Dr Trula Slade scheduled, lf vm detailing info and to call office if that date/time didn't work. Pt may call to check for cancellations as well.

## 2022-10-03 ENCOUNTER — Ambulatory Visit: Payer: Managed Care, Other (non HMO) | Admitting: Sports Medicine

## 2022-10-07 ENCOUNTER — Ambulatory Visit: Payer: Managed Care, Other (non HMO) | Admitting: Physician Assistant

## 2022-10-07 NOTE — Progress Notes (Deleted)
Cardiology Office Note:    Date:  10/07/2022   ID:  Shawn Daniels, DOB 1956-05-27, MRN [id number]  PCP:  Shawn Chroman, MD  [facility] Cardiologist:  Mertie Moores, MD (previously Dr. Einar Gip) [place] Electrophysiologist:  None   Chief Complaint: 6 months follow up   History of Present Illness:    Shawn Daniels is a 66 y.o. male with a hx of PVD followed by Dr. Trula Slade, CAD by coronary CT, diabetes and hypertension seen for follow-up.  Coronary calcium score 08/16/2021: LM-20 LAD 167 LCx 41 RCA 394. Total [place] score 1523. MESA database percentile: 97 Ascending and descending thoracic aorta are normal in measurements.  Aortic atherosclerosis. No significant extra cardiac abnormality.  Hx of PAD s/p femoral endarterectomy and most recent intervention 08/20/22 Impression:             #1  Nearly occlusive heavily calcified lesion within the right popliteal artery behind the patella successfully treated using a orbital atherectomy CSI 1.5 classic device followed by 4 mm drug-coated balloon angioplasty and ultimate 6 x 40 drug-coated stent placement.             #2 two-vessel runoff via the anterior tibial and peroneal artery.  The posterior tibial does reconstitute in the mid calf and across the ankle.  Here today for follow up.     Past Medical History:  Diagnosis Date   Diabetes mellitus without complication (HCC)    GERD (gastroesophageal reflux disease)    Hypertension    Peripheral vascular disease ([place])     Past Surgical History:  Procedure Laterality Date   ABDOMINAL AORTOGRAM W/LOWER EXTREMITY Right 08/20/2022   Procedure: ABDOMINAL AORTOGRAM W/LOWER EXTREMITY;  Surgeon: Serafina Mitchell, MD;  Location: [place] [facility];  Service: Cardiovascular;  Laterality: Right;   ENDARTERECTOMY FEMORAL Right 04/26/2022   Procedure: RIGHT FEMORAL ENDARTERECTOMY;  Surgeon: Serafina Mitchell, MD;  [facility];  Service: Vascular;  Laterality: Right;   KNEE  SURGERY     LOWER EXTREMITY ANGIOGRAPHY N/A 01/01/2022   Procedure: LOWER EXTREMITY ANGIOGRAPHY;  Surgeon: Nigel Mormon, MD;  Location: [place] [facility];  Service: Cardiovascular;  Laterality: N/A;   PATCH ANGIOPLASTY Right 04/26/2022   Procedure: PATCH ANGIOPLASTY OF RIGHT FEMORAL ARTERY USING XENOSURE BOVINE [place];  Surgeon: Serafina Mitchell, MD;  [facility];  Service: Vascular;  Laterality: Right;   PENILE PROSTHESIS IMPLANT     PERIPHERAL VASCULAR ATHERECTOMY  08/20/2022   Procedure: PERIPHERAL VASCULAR ATHERECTOMY;  Surgeon: Serafina Mitchell, MD;  Location: [place] [facility];  Service: Cardiovascular;;  popliteal   PERIPHERAL VASCULAR BALLOON ANGIOPLASTY  01/08/2022   Procedure: PERIPHERAL VASCULAR BALLOON ANGIOPLASTY;  Surgeon: Adrian Prows, MD;  Location: [place] [facility];  Service: Cardiovascular;;   WRIST SURGERY      Current Medications: No outpatient medications have been marked as taking for the 10/07/22 encounter (Appointment) with Leanor Kail, [place].     Allergies:   Codeine, Keflex [cephalexin], and Penicillins   Social History   Socioeconomic History   Marital status: Married    Spouse name: Not on file   Number of children: 0   Years of education: Not on file   Highest education level: Not on file  Occupational History   Not on file  Tobacco Use   Smoking status: Former    Packs/day: 1.50    Years: 30.00    Total pack years: 45.00    Types: Cigarettes, Cigars  Quit date: 2009    Years since quitting: 14.7   Smokeless tobacco: Never  Vaping Use   Vaping Use: Never used  Substance and Sexual Activity   Alcohol use: Yes    Alcohol/week: 3.0 standard drinks of alcohol    Types: 3 Cans of beer per week    Comment: daily   Drug use: Never   Sexual activity: Not on file  Other Topics Concern   Not on file  Social History Narrative   Not on file   Social Determinants of Health   Financial Resource Strain: Not on file  Food  Insecurity: Not on file  Transportation Needs: Not on file  Physical Activity: Not on file  Stress: Not on file  Social Connections: Not on file     Family History: The patient's family history includes Heart failure in his father.  ***  ROS:   Please see the history of present illness.    All other systems reviewed and are negative. ***  EKGs/Labs/Other Studies Reviewed:    The following studies were reviewed today: As above  EKG:  EKG is *** ordered today.  The ekg ordered today demonstrates ***  Recent Labs: 04/27/2022: Platelets 108 06/18/2022: ALT 30 08/20/2022: BUN 19; Creatinine, Ser 0.80; Hemoglobin 13.9; Potassium 5.5; Sodium 138  Recent Lipid Panel    Component Value Date/Time   CHOL 188 06/18/2022 1010   TRIG 305 (H) 06/18/2022 1010   HDL 62 06/18/2022 1010   CHOLHDL 3.0 06/18/2022 1010   LDLCALC 77 06/18/2022 1010     Risk Assessment/Calculations:   {Does this patient have ATRIAL FIBRILLATION?[phone]}   Physical Exam:    VS:  There were no vitals taken for this visit.    Wt Readings from Last 3 Encounters:  09/23/22 203 lb (92.1 kg)  08/20/22 202 lb (91.6 kg)  08/05/22 206 lb (93.4 kg)     GEN: *** Well nourished, well developed in no acute distress HEENT: Normal NECK: No JVD; No carotid bruits LYMPHATICS: No lymphadenopathy CARDIAC: ***RRR, no murmurs, rubs, gallops RESPIRATORY:  Clear to auscultation without rales, wheezing or rhonchi  ABDOMEN: Soft, non-tender, non-distended MUSCULOSKELETAL:  No edema; No deformity  SKIN: Warm and dry NEUROLOGIC:  Alert and oriented x 3 PSYCHIATRIC:  Normal affect   ASSESSMENT AND PLAN:    CAD  2. PAD  3. HLD  Medication Adjustments/Labs and Tests Ordered: Current medicines are reviewed at length with the patient today.  Concerns regarding medicines are outlined above.  No orders of the defined types were placed in this encounter.  No orders of the defined types were placed in this  encounter.   There are no Patient Instructions on file for this visit.   Jarrett Soho, [place]  10/07/2022 12:27 PM    [facility]

## 2022-10-08 ENCOUNTER — Encounter: Payer: Self-pay | Admitting: Cardiovascular Disease

## 2022-10-08 ENCOUNTER — Ambulatory Visit: Payer: Managed Care, Other (non HMO) | Attending: Physician Assistant | Admitting: Cardiovascular Disease

## 2022-10-08 VITALS — BP 130/70 | HR 113 | Ht 73.0 in | Wt 203.0 lb

## 2022-10-08 DIAGNOSIS — I10 Essential (primary) hypertension: Secondary | ICD-10-CM | POA: Diagnosis not present

## 2022-10-08 DIAGNOSIS — R000 Tachycardia, unspecified: Secondary | ICD-10-CM

## 2022-10-08 DIAGNOSIS — I739 Peripheral vascular disease, unspecified: Secondary | ICD-10-CM | POA: Diagnosis not present

## 2022-10-08 DIAGNOSIS — E7800 Pure hypercholesterolemia, unspecified: Secondary | ICD-10-CM

## 2022-10-08 DIAGNOSIS — E782 Mixed hyperlipidemia: Secondary | ICD-10-CM

## 2022-10-08 MED ORDER — METOPROLOL SUCCINATE ER 25 MG PO TB24: 25.0000 mg | ORAL_TABLET | Freq: Every day | ORAL | 3 refills | Status: DC

## 2022-10-08 NOTE — Patient Instructions (Addendum)
Medication Instructions:  INCREASE Metoprolol Succinate (Toprol XL) to 25mg  daily *If you need a refill on your cardiac medications before your next appointment, please call your pharmacy*   Lab Work: LIPIDS (this week) If you have labs (blood work) drawn today and your tests are completely normal, you will receive your results only by: [place] (if you have MyChart) OR A paper copy in the mail If you have any lab test that is abnormal or we need to change your treatment, we will call you to review the results.   Testing/Procedures: NONE   Follow-Up: At [facility], you and your health needs are our priority.  As part of our continuing mission to provide you with exceptional heart care, we have created designated Provider Care Teams.  These Care Teams include your primary Cardiologist (physician) and Advanced Practice Providers (APPs -  Physician Assistants and Nurse Practitioners) who all work together to provide you with the care you need, when you need it.  Your next appointment:   3 month(s)  The format for your next appointment:   In Person  Provider:   Christen Bame, NP    Important Information About Sugar         For your  leg edema you  should do  the following 1. Leg elevation - I recommend the Lounge Dr. Leg rest.  See below for details  2. Salt restriction  -  Use potassium chloride instead of regular salt as a salt substitute. 3. Walk regularly 4. Compression hose - Medical Supply store  5. Weight loss    Available on [email or web address] Or  Go to [email or web address]

## 2022-10-08 NOTE — Progress Notes (Signed)
Cardiology Office Note:    Date:  10/08/2022   ID:  Shawn Daniels, DOB 03/24/1956, MRN [id number]  PCP:  Glenda Chroman, MD   [facility] Providers Cardiologist:   Cobie Marcoux     Referring MD: Glenda Chroman, MD   Chief Complaint  Patient presents with   PAD     History of Present Illness:    Shawn Daniels is a 66 y.o. male with a hx of peripheral vascular disease, status post percutaneous angioplasty, coronary artery calcification ( coronary calcium score revealed total score 1523 placing patient in the 97th percentile.  )  Was previously see by [facility]  Had 2 separate PAD procedures but Dr. Nadyne Coombes was not able to place a stent .  Had a mid R SFA stenosis, treated with andioplasty  - no stent was placed.  His symptoms returned within 3-4 weeks .  [place] the [place] and thought it was altitude. By his claudication symptoms did not improve with return to [employer] .    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 His last lipid profile from June, 2023 shows a total cholesterol of 188, HDL 62, triglyceride level is 305, LDL is 77  Had left leg stenting by [facility] recently open procedures procedures  He wants to recheck his lipids . If his Trigs  Past Medical History:  Diagnosis Date   Diabetes mellitus without complication (HCC)    GERD (gastroesophageal reflux disease)    Hypertension    Peripheral vascular disease ([place])     Past Surgical History:  Procedure Laterality Date   ABDOMINAL AORTOGRAM W/LOWER EXTREMITY Right 08/20/2022    Procedure: ABDOMINAL AORTOGRAM W/LOWER EXTREMITY;  Surgeon: Serafina Mitchell, MD;  Location: [place] [facility];  Service: Cardiovascular;  Laterality: Right;   ENDARTERECTOMY FEMORAL Right 04/26/2022   Procedure: RIGHT FEMORAL ENDARTERECTOMY;  Surgeon: Serafina Mitchell, MD;  Location: [facility];  Service: Vascular;  Laterality: Right;   KNEE SURGERY     LOWER EXTREMITY ANGIOGRAPHY N/A 01/01/2022   Procedure: LOWER EXTREMITY ANGIOGRAPHY;  Surgeon: Nigel Mormon, MD;  Location: [place] [facility];  Service: Cardiovascular;  Laterality: N/A;   PATCH ANGIOPLASTY Right 04/26/2022   Procedure: PATCH ANGIOPLASTY OF RIGHT FEMORAL ARTERY USING XENOSURE BOVINE [place];  Surgeon: Serafina Mitchell, MD;  Location: [facility];  Service: Vascular;  Laterality: Right;   PENILE PROSTHESIS IMPLANT     PERIPHERAL VASCULAR ATHERECTOMY  08/20/2022   Procedure: PERIPHERAL VASCULAR ATHERECTOMY;  Surgeon: Serafina Mitchell, MD;  Location: [place] [facility];  Service: Cardiovascular;;  popliteal   PERIPHERAL VASCULAR BALLOON ANGIOPLASTY  01/08/2022   Procedure: PERIPHERAL VASCULAR BALLOON ANGIOPLASTY;  Surgeon: Adrian Prows, MD;  Location: [place] [facility];  Service: Cardiovascular;;   WRIST SURGERY      Current Medications: Current Meds  Medication Sig   ACCU-CHEK GUIDE test strip USE 1 TEST STRIP TWICE DAILY   aspirin  EC 81 MG tablet Take 81 mg by mouth at bedtime.   clopidogrel (PLAVIX) 75 MG tablet Take 75 mg by mouth daily.   Evolocumab (REPATHA SURECLICK) XX123456 MG/ML SOAJ Inject 1 pen. into the skin every 14 (fourteen) days.   fenofibrate (TRICOR) 145 MG tablet Take 1 tablet (145 mg total) by mouth daily.   GLYXAMBI 25-5 MG TABS Take 1 tablet by mouth daily.   lisinopril (PRINIVIL,ZESTRIL) 10 MG tablet Take 10 mg by mouth in the morning and at bedtime.   Magnesium 400 MG CAPS Take 400-800 mg by mouth See admin instructions. 400mg  in am 800mg   in pm   metoprolol succinate (TOPROL XL) 25 MG 24 hr tablet Take  1 tablet (25 mg total) by mouth daily.   Multiple Vitamins-Minerals (MULTIVITAMIN WITH MINERALS) tablet Take 1 tablet by mouth daily.   nitroGLYCERIN (NITRODUR - DOSED IN MG/24 HR) 0.2 mg/hr patch APPLY 1/4 (ONE-FOURTH) PATCH TO AFFECTED ELBOW, CHANGE DAILY   NOVOLIN 70/30 RELION (70-30) 100 UNIT/ML injection Inject 30 Units into the skin 2 (two) times daily with a meal. SLIDING Scale   omeprazole (PRILOSEC) 20 MG capsule Take 20 mg by mouth daily.   Potassium 99 MG TABS Take 99 mg by mouth daily.   RELION INSULIN SYRINGE 31G X 15/64" 0.5 ML MISC USE 1 THREE TIMES DAILY   rosuvastatin (CRESTOR) 10 MG tablet Take 10 mg by mouth every Wednesday.   [DISCONTINUED] metoprolol succinate (TOPROL-XL) 25 MG 24 hr tablet Take 0.5 tablets (12.5 mg total) by mouth daily. Take with or immediately following a meal.     Allergies:   Codeine, Keflex [cephalexin], and Penicillins   Social History   Socioeconomic History   Marital status: Married    Spouse name: Not on file   Number of children: 0   Years of education: Not on file   Highest education level: Not on file  Occupational History   Not on file  Tobacco Use   Smoking status: Former    Packs/day: 1.50    Years: 30.00    Total pack years: 45.00    Types: Cigarettes, Cigars    Quit date: 2009    Years since quitting: 14.8   Smokeless tobacco: Never  Vaping Use   Vaping Use: Never used  Substance and Sexual Activity   Alcohol use: Yes    Alcohol/week: 3.0 standard drinks of alcohol    Types: 3 Cans of beer per week    Comment: daily   Drug use: Never   Sexual activity: Not on file  Other Topics Concern   Not on file  Social History Narrative   Not on file   Social Determinants of Health   Financial Resource Strain: Not on file  Food Insecurity: Not on file  Transportation Needs: Not on file  Physical Activity: Not on file  Stress: Not on file  Social Connections: Not on file     Family History: The patient's family  history includes Heart failure in his father.  ROS:   Please see the history of present illness.     All other systems reviewed and are negative.  EKGs/Labs/Other Studies Reviewed:    The following studies were reviewed today:   EKG:     Recent Labs: 04/27/2022: Platelets 108 06/18/2022: ALT 30 08/20/2022: BUN 19; Creatinine, Ser 0.80; Hemoglobin 13.9; Potassium 5.5; Sodium 138  Recent Lipid Panel    Component Value Date/Time   CHOL 188 06/18/2022 1010   TRIG 305 (H) 06/18/2022  1010   HDL 62 06/18/2022 1010   CHOLHDL 3.0 06/18/2022 1010   LDLCALC 77 06/18/2022 1010     Risk Assessment/Calculations:           Physical Exam:    Physical Exam: Blood pressure 130/70, pulse (!) 113, height 6\' 1"  (1.854 m), weight 203 lb (92.1 kg), SpO2 94 %.      GEN:  Well nourished, well developed in no acute distress HEENT: Normal NECK: No JVD; No carotid bruits LYMPHATICS: No lymphadenopathy CARDIAC: RRR , no murmurs, rubs, gallops RESPIRATORY:  Clear to auscultation without rales, wheezing or rhonchi  ABDOMEN: Soft, non-tender, non-distended MUSCULOSKELETAL:  No edema; No deformity  SKIN: Warm and dry NEUROLOGIC:  Alert and oriented x 3   ASSESSMENT:    1. Hypercholesteremia   2. Sinus tachycardia   3. Primary hypertension   4. PAD (peripheral artery disease) ([place])   5. Mixed hyperlipidemia    PLAN:       Claudication:  follow up with Dr. Ilsa Iha   2.  Hyperlipidemia:   Trigs are still elevated.   Advised much better diet , Will recheck lipids on Friday  If his trigs are still elevated.  , would add Vascepa 2 gm PO BID   3.  Sinus tachycardia :   increase toprol XL 25 mg a day      Will have him see an APP in 6 months      Medication Adjustments/Labs and Tests Ordered: Current medicines are reviewed at length with the patient today.  Concerns regarding medicines are outlined above.  Orders Placed This Encounter  Procedures   Lipid panel   Meds ordered  this encounter  Medications   metoprolol succinate (TOPROL XL) 25 MG 24 hr tablet    Sig: Take 1 tablet (25 mg total) by mouth daily.    Dispense:  90 tablet    Refill:  3    Patient Instructions  Medication Instructions:  INCREASE Metoprolol Succinate (Toprol XL) to 25mg  daily *If you need a refill on your cardiac medications before your next appointment, please call your pharmacy*   Lab Work: LIPIDS (this week) If you have labs (blood work) drawn today and your tests are completely normal, you will receive your results only by: [place] (if you have MyChart) OR A paper copy in the mail If you have any lab test that is abnormal or we need to change your treatment, we will call you to review the results.   Testing/Procedures: NONE   Follow-Up: At [facility], you and your health needs are our priority.  As part of our continuing mission to provide you with exceptional heart care, we have created designated Provider Care Teams.  These Care Teams include your primary Cardiologist (physician) and Advanced Practice Providers (APPs -  Physician Assistants and Nurse Practitioners) who all work together to provide you with the care you need, when you need it.  Your next appointment:   3 month(s)  The format for your next appointment:   In Person  Provider:   Christen Bame, NP    Important Information About Sugar         For your  leg edema you  should do  the following 1. Leg elevation - I recommend the Lounge Dr. Leg rest.  See below for details  2. Salt restriction  -  Use potassium chloride instead of regular salt as a salt substitute. 3. Walk regularly 4. Compression hose - [facility] [employer]  store  5. Weight loss    Available on [email or web address] Or  Go to [email or web address]       Signed, Mertie Moores, MD  10/08/2022 5:03 PM    [place]

## 2022-10-11 ENCOUNTER — Other Ambulatory Visit: Payer: Managed Care, Other (non HMO)

## 2022-10-14 ENCOUNTER — Ambulatory Visit: Payer: Managed Care, Other (non HMO) | Attending: Cardiovascular Disease

## 2022-10-14 DIAGNOSIS — R000 Tachycardia, unspecified: Secondary | ICD-10-CM

## 2022-10-14 DIAGNOSIS — E7800 Pure hypercholesterolemia, unspecified: Secondary | ICD-10-CM

## 2022-10-14 DIAGNOSIS — E782 Mixed hyperlipidemia: Secondary | ICD-10-CM

## 2022-10-14 DIAGNOSIS — I739 Peripheral vascular disease, unspecified: Secondary | ICD-10-CM

## 2022-10-14 DIAGNOSIS — I10 Essential (primary) hypertension: Secondary | ICD-10-CM

## 2022-10-14 LAB — LIPID PANEL
Chol/HDL Ratio: 3.9 ratio (ref 0.0–5.0)
Cholesterol, Total: 229 mg/dL — ABNORMAL HIGH (ref 100–199)
HDL: 59 mg/dL (ref >39)
LDL Chol Calc (NIH): 131 mg/dL — ABNORMAL HIGH (ref 0–99)
Triglycerides: 221 mg/dL — ABNORMAL HIGH (ref 0–149)
VLDL Cholesterol Cal: 39 mg/dL (ref 5–40)

## 2022-10-16 ENCOUNTER — Encounter: Payer: Self-pay | Admitting: Cardiovascular Disease

## 2022-10-18 ENCOUNTER — Telehealth: Payer: Self-pay

## 2022-10-18 DIAGNOSIS — Z79899 Other long term (current) drug therapy: Secondary | ICD-10-CM

## 2022-10-18 DIAGNOSIS — E7800 Pure hypercholesterolemia, unspecified: Secondary | ICD-10-CM

## 2022-10-18 NOTE — Telephone Encounter (Signed)
-----   Message from Thayer Headings, MD sent at 10/16/2022  5:04 PM EDT ----- His LDL has increased from 77 up to 131.  Is he still taking the rosuvastatin? Triglycerides have improved.   Triglyceride level has come down from 305 to 221.  Continue fenofibrate.  Recheck labs in 6 months.

## 2022-10-18 NOTE — Telephone Encounter (Signed)
Returned call to patient who states that his PCP had advised him to decrease his Crestor to once weekly once he started using the [place]. Patient states PCP told him he wasn't sure how the injection worked and wanted to be on the safe side. Patient states that when his lab results came up, he called his PCP about it and was told to resume taking Crestor at 5 days per week. Questioned patient on who was managing the medication and if they were going to be doing repeat labs, he states they do not (aside from his annual check-up). Advised we will place repeat orders to be done in 6 months. He agrees to plan, labs scheduled for 04/22/23.

## 2022-10-21 ENCOUNTER — Encounter (HOSPITAL_COMMUNITY): Payer: Managed Care, Other (non HMO)

## 2022-10-21 ENCOUNTER — Ambulatory Visit: Payer: Managed Care, Other (non HMO) | Admitting: Surgery

## 2022-10-21 ENCOUNTER — Ambulatory Visit (HOSPITAL_COMMUNITY)
Admission: RE | Admit: 2022-10-21 | Discharge: 2022-10-21 | Disposition: A | Discharge status: Home / Self Care | Payer: Managed Care, Other (non HMO) | Source: Ambulatory Visit | Attending: Surgery | Admitting: Surgery

## 2022-10-21 ENCOUNTER — Encounter: Payer: Self-pay | Admitting: Surgery

## 2022-10-21 VITALS — BP 131/78 | HR 82 | Temp 98.3°F | Resp 20 | Ht 73.0 in | Wt 203.0 lb

## 2022-10-21 DIAGNOSIS — I739 Peripheral vascular disease, unspecified: Secondary | ICD-10-CM | POA: Diagnosis present

## 2022-10-21 DIAGNOSIS — I70213 Atherosclerosis of native arteries of extremities with intermittent claudication, bilateral legs: Secondary | ICD-10-CM | POA: Insufficient documentation

## 2022-10-21 NOTE — Progress Notes (Signed)
 [facility] [place]  Patient name: Shawn Daniels MRN: [id number] DOB: 06/16/1956 Sex: male   REASON FOR VISIT:    Follow up  HISOTRY OF PRESENT ILLNESS:    Shawn Daniels is a 65 y.o. male who is status post right iliofemoral endarterectomy with bovine pericardial patch angioplasty on 04/2022 for severe claudication.  Intraoperative findings included a nearly occlusive calcific plaque in the common femoral artery extending up into the distal external iliac artery.  The plaque also extended down onto the superficial femoral artery and profundofemoral artery.  A long patch approximately 8 cm was utilized, starting in the proximal common femoral artery and going down onto the superficial femoral artery for approximately 3 cm. HIs claudication symptoms improved, however he still was not satisfied and so on 08/20/2022 he underwent orbital atherectomy and drug-coated balloon angioplasty followed by stenting of the right popliteal artery.  He is happy with the results of his right leg and wants to discuss treatment of his left    He is on dual antiplatelet therapy as well as a statin for hypercholesterolemia.  He is a former smoker. PAST MEDICAL HISTORY:   Past Medical History:  Diagnosis Date   Diabetes mellitus without complication (HCC)    GERD (gastroesophageal reflux disease)    Hypertension    Peripheral vascular disease (HCC)      FAMILY HISTORY:   Family History  Problem Relation Age of Onset   Heart failure Father     SOCIAL HISTORY:   Social History   Tobacco Use   Smoking status: Former    Packs/day: 1.50    Years: 30.00    Total pack years: 45.00    Types: Cigarettes, Cigars    Quit date: 2009    Years since quitting: 14.8   Smokeless tobacco: Never  Substance Use Topics   Alcohol use: Yes    Alcohol/week: 3.0 standard drinks of alcohol    Types: 3 Cans of beer per week    Comment: daily      ALLERGIES:   Allergies  Allergen Reactions   Codeine Itching    Can take in lower doses   Keflex [Cephalexin] Other (See Comments)    Looked older   Penicillins Other (See Comments)    Childhood     CURRENT MEDICATIONS:   Current Outpatient Medications  Medication Sig Dispense Refill   ACCU-CHEK GUIDE test strip USE 1 TEST STRIP TWICE DAILY     aspirin EC 81 MG tablet Take 81 mg by mouth at bedtime.     clopidogrel (PLAVIX) 75 MG tablet Take 75 mg by mouth daily.     Evolocumab (REPATHA SURECLICK) 140 MG/ML SOAJ Inject 1 pen. into the skin every 14 (fourteen) days. 6 mL 3   fenofibrate (TRICOR) 145 MG tablet Take 1 tablet (145 mg total) by mouth daily. 90 tablet 3   GLYXAMBI 25-5 MG TABS Take 1 tablet by mouth daily.     lisinopril (PRINIVIL,ZESTRIL) 10 MG tablet Take 10 mg by mouth in the morning and at bedtime.     Magnesium 400 MG CAPS Take 400-800 mg by mouth See admin instructions. 400mg in am 800mg  in pm     metoprolol succinate (TOPROL XL) 25 MG 24 hr tablet Take 1 tablet (25 mg total) by mouth daily. 90 tablet 3   Multiple Vitamins-Minerals (MULTIVITAMIN WITH MINERALS) tablet Take 1 tablet by mouth daily.     nitroGLYCERIN (NITRODUR - DOSED IN MG/24 HR)   0.2 mg/hr patch APPLY 1/4 (ONE-FOURTH) PATCH TO AFFECTED ELBOW, CHANGE DAILY 30 patch 1   NOVOLIN 70/30 RELION (70-30) 100 UNIT/ML injection Inject 30 Units into the skin 2 (two) times daily with a meal. SLIDING Scale     omeprazole (PRILOSEC) 20 MG capsule Take 20 mg by mouth daily.     Potassium 99 MG TABS Take 99 mg by mouth daily.     RELION INSULIN SYRINGE 31G X 15/64" 0.5 ML MISC USE 1 THREE TIMES DAILY     rosuvastatin (CRESTOR) 10 MG tablet Take 10 mg by mouth daily. Taking 5 days a week     No current facility-administered medications for this visit.   Facility-Administered Medications Ordered in Other Visits  Medication Dose Route Frequency Provider Last Rate Last Admin   iodixanol (VISIPAQUE) 320 MG/ML  injection    PRN Elaysia Devargas W, MD   120 mL at 08/20/22 1145    REVIEW OF SYSTEMS:   [X] denotes positive finding, [ ] denotes negative finding Cardiac  Comments:  Chest pain or chest pressure:    Shortness of breath upon exertion:    Short of breath when lying flat:    Irregular heart rhythm:        Vascular    Pain in calf, thigh, or hip brought on by ambulation: x   Pain in feet at night that wakes you up from your sleep:     Blood clot in your veins:    Leg swelling:         Pulmonary    Oxygen at home:    Productive cough:     Wheezing:         Neurologic    Sudden weakness in arms or legs:     Sudden numbness in arms or legs:     Sudden onset of difficulty speaking or slurred speech:    Temporary loss of vision in one eye:     Problems with dizziness:         Gastrointestinal    Blood in stool:     Vomited blood:         Genitourinary    Burning when urinating:     Blood in urine:        Psychiatric    Major depression:         Hematologic    Bleeding problems:    Problems with blood clotting too easily:        Skin    Rashes or ulcers:        Constitutional    Fever or chills:      PHYSICAL EXAM:   Vitals:   10/21/22 1437  BP: 131/78  Pulse: 82  Resp: 20  Temp: 98.3 F (36.8 C)  SpO2: 94%  Weight: 203 lb (92.1 kg)  Height: 6' 1" (1.854 m)    GENERAL: The patient is a well-nourished male, in no acute distress. The vital signs are documented above. CARDIAC: There is a regular rate and rhythm.  VASCULAR: Palpable dorsalis pedis pulse on the right PULMONARY: Non-labored respirations MUSCULOSKELETAL: There are no major deformities or cyanosis. NEUROLOGIC: No focal weakness or paresthesias are detected. SKIN: There are no ulcers or rashes noted. PSYCHIATRIC: The patient has a normal affect.  STUDIES:   I have reviewed the following:  Left: 50-74% stenosis noted in the superficial femoral artery.   MEDICAL ISSUES:   Bilateral  claudication: The patient has undergone successful revascularization of the right leg including   right iliofemoral endarterectomy and right popliteal atherectomy with stenting.  He is now having significant issues with the left leg including cramping with activity.  We discussed proceeding with angiography via a right femoral approach and intervention on the left leg if indicated.  This will be scheduled in the near future.    Leia Alf, MD, FACS [facility] [place] [phone] Pager [phone]

## 2022-10-21 NOTE — H&P (View-Only) (Signed)
[facility] [place]  Patient name: Shawn Daniels MRN: [id number] DOB: 1956-03-28 Sex: male   REASON FOR VISIT:    Follow up  HISOTRY OF PRESENT ILLNESS:    Shawn Daniels is a 66 y.o. male who is status post right iliofemoral endarterectomy with bovine pericardial patch angioplasty on 04/2022 for severe claudication.  Intraoperative findings included a nearly occlusive calcific plaque in the common femoral artery extending up into the distal external iliac artery.  The plaque also extended down onto the superficial femoral artery and profundofemoral artery.  A long patch approximately 8 cm was utilized, starting in the proximal common femoral artery and going down onto the superficial femoral artery for approximately 3 cm. HIs claudication symptoms improved, however he still was not satisfied and so on 08/20/2022 he underwent orbital atherectomy and drug-coated balloon angioplasty followed by stenting of the right popliteal artery.  He is happy with the results of his right leg and wants to discuss treatment of his left    He is on dual antiplatelet therapy as well as a statin for hypercholesterolemia.  He is a former smoker. PAST MEDICAL HISTORY:   Past Medical History:  Diagnosis Date   Diabetes mellitus without complication (HCC)    GERD (gastroesophageal reflux disease)    Hypertension    Peripheral vascular disease ([place])      FAMILY HISTORY:   Family History  Problem Relation Age of Onset   Heart failure Father     SOCIAL HISTORY:   Social History   Tobacco Use   Smoking status: Former    Packs/day: 1.50    Years: 30.00    Total pack years: 45.00    Types: Cigarettes, Cigars    Quit date: 2009    Years since quitting: 14.8   Smokeless tobacco: Never  Substance Use Topics   Alcohol use: Yes    Alcohol/week: 3.0 standard drinks of alcohol    Types: 3 Cans of beer per week    Comment: daily      ALLERGIES:   Allergies  Allergen Reactions   Codeine Itching    Can take in lower doses   Keflex [Cephalexin] Other (See Comments)    Looked older   Penicillins Other (See Comments)    Childhood     CURRENT MEDICATIONS:   Current Outpatient Medications  Medication Sig Dispense Refill   ACCU-CHEK GUIDE test strip USE 1 TEST STRIP TWICE DAILY     aspirin EC 81 MG tablet Take 81 mg by mouth at bedtime.     clopidogrel (PLAVIX) 75 MG tablet Take 75 mg by mouth daily.     Evolocumab (REPATHA SURECLICK) [id number] MG/ML SOAJ Inject 1 pen. into the skin every 14 (fourteen) days. 6 mL 3   fenofibrate (TRICOR) 145 MG tablet Take 1 tablet (145 mg total) by mouth daily. 90 tablet 3   GLYXAMBI 25-5 MG TABS Take 1 tablet by mouth daily.     lisinopril (PRINIVIL,ZESTRIL) 10 MG tablet Take 10 mg by mouth in the morning and at bedtime.     Magnesium 400 MG CAPS Take 400-800 mg by mouth See admin instructions. 400mg  in am 800mg   in pm     metoprolol succinate (TOPROL XL) 25 MG 24 hr tablet Take 1 tablet (25 mg total) by mouth daily. 90 tablet 3   Multiple Vitamins-Minerals (MULTIVITAMIN WITH MINERALS) tablet Take 1 tablet by mouth daily.     nitroGLYCERIN (NITRODUR - DOSED IN MG/24 HR)  0.2 mg/hr patch APPLY 1/4 (ONE-FOURTH) PATCH TO AFFECTED ELBOW, CHANGE DAILY 30 patch 1   NOVOLIN 70/30 RELION (70-30) 100 UNIT/ML injection Inject 30 Units into the skin 2 (two) times daily with a meal. SLIDING Scale     omeprazole (PRILOSEC) 20 MG capsule Take 20 mg by mouth daily.     Potassium 99 MG TABS Take 99 mg by mouth daily.     RELION INSULIN SYRINGE 31G X 15/64" 0.5 ML MISC USE 1 THREE TIMES DAILY     rosuvastatin (CRESTOR) 10 MG tablet Take 10 mg by mouth daily. Taking 5 days a week     No current facility-administered medications for this visit.   Facility-Administered Medications Ordered in Other Visits  Medication Dose Route Frequency Provider Last Rate Last Admin   iodixanol (VISIPAQUE) 320 MG/ML  injection    PRN Serafina Mitchell, MD   120 mL at 08/20/22 1145    REVIEW OF SYSTEMS:   [X]  denotes positive finding, [ ]  denotes negative finding Cardiac  Comments:  Chest pain or chest pressure:    Shortness of breath upon exertion:    Short of breath when lying flat:    Irregular heart rhythm:        Vascular    Pain in calf, thigh, or hip brought on by ambulation: x   Pain in feet at night that wakes you up from your sleep:     Blood clot in your veins:    Leg swelling:         Pulmonary    Oxygen at home:    Productive cough:     Wheezing:         Neurologic    Sudden weakness in arms or legs:     Sudden numbness in arms or legs:     Sudden onset of difficulty speaking or slurred speech:    Temporary loss of vision in one eye:     Problems with dizziness:         Gastrointestinal    Blood in stool:     Vomited blood:         Genitourinary    Burning when urinating:     Blood in urine:        Psychiatric    Major depression:         Hematologic    Bleeding problems:    Problems with blood clotting too easily:        Skin    Rashes or ulcers:        Constitutional    Fever or chills:      PHYSICAL EXAM:   Vitals:   10/21/22 1437  BP: 131/78  Pulse: 82  Resp: 20  Temp: 98.3 F (36.8 C)  SpO2: 94%  Weight: 203 lb (92.1 kg)  Height: 6\' 1"  (1.854 m)    GENERAL: The patient is a well-nourished male, in no acute distress. The vital signs are documented above. CARDIAC: There is a regular rate and rhythm.  VASCULAR: Palpable dorsalis pedis pulse on the right PULMONARY: Non-labored respirations MUSCULOSKELETAL: There are no major deformities or cyanosis. NEUROLOGIC: No focal weakness or paresthesias are detected. SKIN: There are no ulcers or rashes noted. PSYCHIATRIC: The patient has a normal affect.  STUDIES:   I have reviewed the following:  Left: 50-74% stenosis noted in the superficial femoral artery.   MEDICAL ISSUES:   Bilateral  claudication: The patient has undergone successful revascularization of the right leg including  right iliofemoral endarterectomy and right popliteal atherectomy with stenting.  He is now having significant issues with the left leg including cramping with activity.  We discussed proceeding with angiography via a right femoral approach and intervention on the left leg if indicated.  This will be scheduled in the near future.    Leia Alf, MD, FACS [facility] [place] [phone] Pager [phone]

## 2022-10-23 ENCOUNTER — Other Ambulatory Visit: Payer: Self-pay

## 2022-10-23 DIAGNOSIS — I70213 Atherosclerosis of native arteries of extremities with intermittent claudication, bilateral legs: Secondary | ICD-10-CM

## 2022-10-23 DIAGNOSIS — I739 Peripheral vascular disease, unspecified: Secondary | ICD-10-CM

## 2022-11-19 ENCOUNTER — Ambulatory Visit (HOSPITAL_COMMUNITY)
Admission: RE | Admit: 2022-11-19 | Discharge: 2022-11-19 | Disposition: A | Discharge status: Home / Self Care | Payer: Managed Care, Other (non HMO) | Attending: Surgery | Admitting: Surgery

## 2022-11-19 ENCOUNTER — Ambulatory Visit (HOSPITAL_COMMUNITY)
Admission: RE | Disposition: A | Discharge status: Home / Self Care | Payer: Self-pay | Source: Home / Self Care | Attending: Surgery

## 2022-11-19 ENCOUNTER — Other Ambulatory Visit: Payer: Self-pay

## 2022-11-19 DIAGNOSIS — Z87891 Personal history of nicotine dependence: Secondary | ICD-10-CM | POA: Insufficient documentation

## 2022-11-19 DIAGNOSIS — I70212 Atherosclerosis of native arteries of extremities with intermittent claudication, left leg: Secondary | ICD-10-CM | POA: Insufficient documentation

## 2022-11-19 DIAGNOSIS — Z95828 Presence of other vascular implants and grafts: Secondary | ICD-10-CM | POA: Insufficient documentation

## 2022-11-19 DIAGNOSIS — I70213 Atherosclerosis of native arteries of extremities with intermittent claudication, bilateral legs: Secondary | ICD-10-CM

## 2022-11-19 DIAGNOSIS — I739 Peripheral vascular disease, unspecified: Secondary | ICD-10-CM

## 2022-11-19 DIAGNOSIS — E7800 Pure hypercholesterolemia, unspecified: Secondary | ICD-10-CM | POA: Insufficient documentation

## 2022-11-19 HISTORY — PX: PERIPHERAL VASCULAR INTERVENTION: CATH118257

## 2022-11-19 HISTORY — PX: PERIPHERAL INTRAVASCULAR LITHOTRIPSY: CATH118324

## 2022-11-19 HISTORY — PX: ABDOMINAL AORTOGRAM W/LOWER EXTREMITY: CATH118223

## 2022-11-19 LAB — POCT I-STAT, CHEM 8
BUN: 18 mg/dL (ref 8–23)
Calcium, Ion: 1.23 mmol/L (ref 1.15–1.40)
Chloride: 100 mmol/L (ref 98–111)
Creatinine, Ser: 0.8 mg/dL (ref 0.61–1.24)
Glucose, Bld: 89 mg/dL (ref 70–99)
HCT: 39 % (ref 39.0–52.0)
Hemoglobin: 13.3 g/dL (ref 13.0–17.0)
Potassium: 3.9 mmol/L (ref 3.5–5.1)
Sodium: 138 mmol/L (ref 135–145)
TCO2: 25 mmol/L (ref 22–32)

## 2022-11-19 LAB — GLUCOSE, CAPILLARY: Glucose-Capillary: 111 mg/dL — ABNORMAL HIGH (ref 70–99)

## 2022-11-19 LAB — POCT ACTIVATED CLOTTING TIME
Activated Clotting Time: 251 seconds
Activated Clotting Time: 173 seconds
Activated Clotting Time: 203 seconds

## 2022-11-19 SURGERY — ABDOMINAL AORTOGRAM W/LOWER EXTREMITY
Anesthesia: LOCAL

## 2022-11-19 MED ORDER — FENTANYL CITRATE (PF) 100 MCG/2ML IJ SOLN
INTRAMUSCULAR | Status: AC
  Filled 2022-11-19: qty 2

## 2022-11-19 MED ORDER — SODIUM CHLORIDE 0.9% FLUSH: 3.0000 mL | Freq: Two times a day (BID) | INTRAVENOUS | Status: DC

## 2022-11-19 MED ORDER — MIDAZOLAM HCL 2 MG/2ML IJ SOLN
INTRAMUSCULAR | Status: AC
  Filled 2022-11-19: qty 2

## 2022-11-19 MED ORDER — LIDOCAINE HCL (PF) 1 % IJ SOLN
INTRAMUSCULAR | Status: AC
  Filled 2022-11-19: qty 30

## 2022-11-19 MED ORDER — ACETAMINOPHEN 325 MG PO TABS: 650.0000 mg | ORAL_TABLET | ORAL | Status: DC | PRN

## 2022-11-19 MED ORDER — CLOPIDOGREL BISULFATE 300 MG PO TABS
ORAL_TABLET | ORAL | Status: DC | PRN
  Administered 2022-11-19: 75 mg via ORAL

## 2022-11-19 MED ORDER — MIDAZOLAM HCL 2 MG/2ML IJ SOLN
INTRAMUSCULAR | Status: DC | PRN
  Administered 2022-11-19: 1 mg via INTRAVENOUS
  Administered 2022-11-19: 2 mg via INTRAVENOUS
  Administered 2022-11-19: 1 mg via INTRAVENOUS

## 2022-11-19 MED ORDER — OXYCODONE HCL 5 MG PO TABS: 5.0000 mg | ORAL_TABLET | ORAL | Status: DC | PRN

## 2022-11-19 MED ORDER — LIDOCAINE HCL (PF) 1 % IJ SOLN
INTRAMUSCULAR | Status: DC | PRN
  Administered 2022-11-19: 10 mL

## 2022-11-19 MED ORDER — HEPARIN SODIUM (PORCINE) 1000 UNIT/ML IJ SOLN
INTRAMUSCULAR | Status: DC | PRN
  Administered 2022-11-19: 9000 [IU] via INTRAVENOUS

## 2022-11-19 MED ORDER — CLOPIDOGREL BISULFATE 75 MG PO TABS
ORAL_TABLET | ORAL | Status: AC
  Filled 2022-11-19: qty 1

## 2022-11-19 MED ORDER — SODIUM CHLORIDE 0.9% FLUSH: 3.0000 mL | INTRAVENOUS | Status: DC | PRN

## 2022-11-19 MED ORDER — LABETALOL HCL 5 MG/ML IV SOLN: 10.0000 mg | INTRAVENOUS | Status: DC | PRN

## 2022-11-19 MED ORDER — HYDRALAZINE HCL 20 MG/ML IJ SOLN: 5.0000 mg | INTRAMUSCULAR | Status: DC | PRN

## 2022-11-19 MED ORDER — IODIXANOL 320 MG/ML IV SOLN
INTRAVENOUS | Status: DC | PRN
  Administered 2022-11-19: 120 mL

## 2022-11-19 MED ORDER — HEPARIN (PORCINE) IN NACL 1000-0.9 UT/500ML-% IV SOLN
INTRAVENOUS | Status: DC | PRN
  Administered 2022-11-19 (×2): 500 mL

## 2022-11-19 MED ORDER — SODIUM CHLORIDE 0.9 % IV SOLN: INTRAVENOUS | Status: DC

## 2022-11-19 MED ORDER — SODIUM CHLORIDE 0.9 % WEIGHT BASED INFUSION: 1.0000 mL/kg/h | INTRAVENOUS | Status: DC

## 2022-11-19 MED ORDER — ONDANSETRON HCL 4 MG/2ML IJ SOLN: 4.0000 mg | Freq: Four times a day (QID) | INTRAMUSCULAR | Status: DC | PRN

## 2022-11-19 MED ORDER — SODIUM CHLORIDE 0.9 % IV SOLN: 250.0000 mL | INTRAVENOUS | Status: DC | PRN

## 2022-11-19 MED ORDER — FENTANYL CITRATE (PF) 100 MCG/2ML IJ SOLN
INTRAMUSCULAR | Status: DC | PRN
  Administered 2022-11-19: 25 ug via INTRAVENOUS
  Administered 2022-11-19: 50 ug via INTRAVENOUS
  Administered 2022-11-19: 25 ug via INTRAVENOUS

## 2022-11-19 MED ORDER — HEPARIN SODIUM (PORCINE) 1000 UNIT/ML IJ SOLN
INTRAMUSCULAR | Status: AC
  Filled 2022-11-19: qty 10

## 2022-11-19 MED ORDER — HEPARIN (PORCINE) IN NACL 1000-0.9 UT/500ML-% IV SOLN
INTRAVENOUS | Status: AC
  Filled 2022-11-19: qty 1000

## 2022-11-19 MED ORDER — HYDROMORPHONE HCL 1 MG/ML IJ SOLN: 0.5000 mg | INTRAMUSCULAR | Status: DC | PRN

## 2022-11-19 SURGICAL SUPPLY — 27 items
BALLN MUSTANG 5.0X40 135 (BALLOONS) ×2
BALLOON MUSTANG 5.0X40 135 (BALLOONS) IMPLANT
CATH ANGIO 5F BER2 65CM (CATHETERS) IMPLANT
CATH NAVICROSS ANG 65CM (CATHETERS) IMPLANT
CATH OMNI FLUSH 5F 65CM (CATHETERS) IMPLANT
CATH QUICKCROSS .035X135CM (MICROCATHETER) IMPLANT
CATH QUICKCROSS SUPP .035X90CM (MICROCATHETER) IMPLANT
CATH SHOCKWAVE M5 4.5X60 (CATHETERS) IMPLANT
CATHETER NAVICROSS ANG 65CM (CATHETERS) ×2
DEVICE TORQUE H2O (MISCELLANEOUS) IMPLANT
GLIDEWIRE ADV .035X260CM (WIRE) IMPLANT
GUIDEWIRE ANGLED .035X150CM (WIRE) IMPLANT
KIT ENCORE 26 ADVANTAGE (KITS) IMPLANT
KIT MICROPUNCTURE NIT STIFF (SHEATH) IMPLANT
KIT PV (KITS) ×3 IMPLANT
SHEATH CATAPULT 6FR 45 (SHEATH) IMPLANT
SHEATH PINNACLE 5F 10CM (SHEATH) IMPLANT
SHEATH PINNACLE 6F 10CM (SHEATH) IMPLANT
SHEATH PINNACLE 7F 10CM (SHEATH) IMPLANT
SHEATH PINNACLE 8F 10CM (SHEATH) IMPLANT
STENT ELUVIA 6X40X130 (Permanent Stent) IMPLANT
SYR MEDRAD MARK V 150ML (SYRINGE) IMPLANT
TRANSDUCER W/STOPCOCK (MISCELLANEOUS) ×3 IMPLANT
TRAY PV CATH (CUSTOM PROCEDURE TRAY) ×3 IMPLANT
WIRE BENTSON .035X145CM (WIRE) IMPLANT
WIRE ROSEN-J .035X180CM (WIRE) IMPLANT
WIRE SPARTACORE .014X300CM (WIRE) IMPLANT

## 2022-11-19 NOTE — Progress Notes (Signed)
Up and walked and tolerated well; right groin stable, no bleeding or hematoma 

## 2022-11-19 NOTE — Interval H&P Note (Signed)
History and Physical Interval Note:  11/19/2022 7:39 AM  Shawn Daniels  has presented today for surgery, with the diagnosis of claudication.  The various methods of treatment have been discussed with the patient and family. After consideration of risks, benefits and other options for treatment, the patient has consented to  Procedure(s): ABDOMINAL AORTOGRAM W/LOWER EXTREMITY (N/A) as a surgical intervention.  The patient's history has been reviewed, patient examined, no change in status, stable for surgery.  I have reviewed the patient's chart and labs.  Questions were answered to the patient's satisfaction.     Durene Cal

## 2022-11-19 NOTE — Op Note (Signed)
Patient name: Shawn Daniels MRN: [id number] DOB: [birth year] Sex: male  11/19/2022 Pre-operative Diagnosis: left leg claudication Post-operative diagnosis:  Same Surgeon:  Annamarie Major Procedure Performed:  1.  Ultrasound-guided access, right femoral artery  2.  Abdominal aortogram  3.  Left lower extremity runoff  4.  Shock wave intra-arterial lithotripsy, left popliteal artery  5.  Stent, left popliteal artery  6.  Conscious sedation, 81 minutes     Indications: The patient has previously undergone right leg revascularization via femoral endarterectomy and superficial femoral artery stenting.  He has heavily calcified arteries.  He is having worsening symptoms in his left leg.  He comes in today for angiogram and possible intervention  Procedure:  The patient was identified in the holding area and taken to room 8.  The patient was then placed supine on the table and prepped and draped in the usual sterile fashion.  A time out was called.  Conscious sedation was administered with the use of IV fentanyl and Versed under continuous physician and nurse monitoring.  Heart rate, blood pressure, and oxygen saturation were continuously monitored.  Total sedation time was 81 minutes.  Ultrasound was used to evaluate the right common femoral artery.  It was patent .  A digital ultrasound image was acquired.  A micropuncture needle was used to access the right common femoral artery under ultrasound guidance.  An 018 wire was advanced without resistance and a micropuncture sheath was placed.  The 018 wire was removed and a benson wire was placed.  The micropuncture sheath was exchanged for a 5 french sheath.  An omniflush catheter was advanced over the wire to the level of L-1.  An abdominal angiogram was obtained.  Next, using the omniflush catheter and a benson wire, the aortic bifurcation was crossed and the catheter was placed into theleft external iliac artery and left runoff was obtained.    Findings:   Aortogram: No significant renal artery stenosis identified.  The infrarenal abdominal aorta is widely patent.  Bilateral common and external iliac arteries are widely patent.  Right Lower Extremity: Not evaluated  Left Lower Extremity: The left common femoral and profundofemoral artery are widely patent.  The superficial femoral artery is widely patent.  There is an occlusion of the popliteal artery at the level of the patella.  There is a heavily calcified plaque at that level.  He reconstitution distally.  There is single-vessel runoff via the peroneal  Intervention: After the above images were acquired the decision was made to proceed with intervention.  I had to use multiple dilators in order to get through the scar tissue in the right groin.  Ultimately a 6 x 45 catapulted sheath was placed over a Rosen wire into the left external iliac artery.  The patient was fully heparinized.  Using a 035 glide wire advantage and a quick cross catheter, I was able to cross the lesion.  I then selected a 4.5 x 60 shockwave balloon and perform intra-arterial lithotripsy at 2 atm and 4 atm and 6 atm.  A total of 180 pulses were given.  This was done over a 014 wire.  I then switched out back to the Glidewire advantage and deployed a 6 x 40 Elluvia stent which was postdilated with a 5 mm balloon.  Completion imaging revealed resolution of the stenosis and inline flow through the peroneal artery with no change in runoff.  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  This was treated with intra-arterial lithotripsy followed by stenting using a 6 x 40 drug-eluting stent  #2  Single-vessel runoff via the peroneal artery    V. Annamarie Major, M.D., [facility] [place] Office: [phone] Pager:   [phone]

## 2022-11-19 NOTE — Progress Notes (Signed)
Site area- right  Site Prior to Removal- 0   Pressure Applied For-  20 MInutes   Bedrest Beginning at - 1105   Manual- Yes   Patient Status During Pull- Stable    Post Pull Groin Site- 0   Post Pull Instructions Given- Yes   Post Pull Pulses Present- Yes    Dressing Applied- Tegaderm and Gauze Dressing    Comments:

## 2022-11-20 ENCOUNTER — Encounter (HOSPITAL_COMMUNITY): Payer: Self-pay | Admitting: Surgery

## 2022-11-20 MED FILL — Clopidogrel Bisulfate Tab 75 MG (Base Equiv): ORAL | Qty: 1 | Status: AC

## 2022-12-12 ENCOUNTER — Other Ambulatory Visit: Payer: Self-pay | Admitting: *Deleted

## 2022-12-12 DIAGNOSIS — I70211 Atherosclerosis of native arteries of extremities with intermittent claudication, right leg: Secondary | ICD-10-CM

## 2022-12-12 DIAGNOSIS — I739 Peripheral vascular disease, unspecified: Secondary | ICD-10-CM

## 2022-12-30 ENCOUNTER — Ambulatory Visit (INDEPENDENT_AMBULATORY_CARE_PROVIDER_SITE_OTHER): Payer: Managed Care, Other (non HMO) | Admitting: Surgery

## 2022-12-30 ENCOUNTER — Encounter: Payer: Self-pay | Admitting: Surgery

## 2022-12-30 ENCOUNTER — Ambulatory Visit (INDEPENDENT_AMBULATORY_CARE_PROVIDER_SITE_OTHER)
Admission: RE | Admit: 2022-12-30 | Discharge: 2022-12-30 | Disposition: A | Discharge status: Home / Self Care | Payer: Managed Care, Other (non HMO) | Source: Ambulatory Visit | Attending: Surgery | Admitting: Surgery

## 2022-12-30 ENCOUNTER — Ambulatory Visit (HOSPITAL_COMMUNITY)
Admission: RE | Admit: 2022-12-30 | Discharge: 2022-12-30 | Disposition: A | Discharge status: Home / Self Care | Payer: Managed Care, Other (non HMO) | Source: Ambulatory Visit | Attending: Surgery | Admitting: Surgery

## 2022-12-30 VITALS — BP 116/71 | HR 74 | Temp 98.2°F | Resp 20 | Ht 73.0 in | Wt 203.0 lb

## 2022-12-30 DIAGNOSIS — I739 Peripheral vascular disease, unspecified: Secondary | ICD-10-CM

## 2022-12-30 DIAGNOSIS — I70211 Atherosclerosis of native arteries of extremities with intermittent claudication, right leg: Secondary | ICD-10-CM

## 2022-12-30 DIAGNOSIS — I70213 Atherosclerosis of native arteries of extremities with intermittent claudication, bilateral legs: Secondary | ICD-10-CM

## 2022-12-30 LAB — VAS US ABI WITH/WO TBI
Left ABI: 0.94
Right ABI: 1.09

## 2022-12-30 NOTE — Progress Notes (Signed)
[facility]  Patient name: Shawn Daniels MRN: [id number] DOB: [birth year] Sex: male   REASON FOR VISIT:    Follow-up  HISOTRY OF PRESENT ILLNESS:   Shawn Daniels is a 67 y.o. male who is status post right iliofemoral endarterectomy with bovine pericardial patch angioplasty on 04/2022 for severe claudication.  Intraoperative findings included a nearly occlusive calcific plaque in the common femoral artery extending up into the distal external iliac artery.  The plaque also extended down onto the superficial femoral artery and profundofemoral artery.  A long patch approximately 8 cm was utilized, starting in the proximal common femoral artery and going down onto the superficial femoral artery for approximately 3 cm. HIs claudication symptoms improved, however he still was not satisfied and so on 08/20/2022 he underwent orbital atherectomy and drug-coated balloon angioplasty followed by stenting of the right popliteal artery.  Because he had such good results with the right leg, he wanted the left leg addressed.  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  This was treated with crossing of the lesion followed by intra-arterial lithotripsy and subsequent drug-eluting stent placement.  He has single-vessel runoff via the peroneal artery.  He has no complaints today.  He no longer has claudication symptoms     He is on dual antiplatelet therapy as well as a statin for hypercholesterolemia.  He is a former smoker.   PAST MEDICAL HISTORY:   Past Medical History:  Diagnosis Date   Diabetes mellitus without complication (HCC)    GERD (gastroesophageal reflux disease)    Hypertension    Peripheral vascular disease (HCC)      FAMILY HISTORY:   Family History  Problem Relation Age of Onset   Heart failure Father     SOCIAL HISTORY:    Social History   Tobacco Use   Smoking status: Former    Packs/day: 1.50    Years: 30.00    Total pack years: 45.00    Types: Cigarettes, Cigars    Quit date: 2009    Years since quitting: 15.0   Smokeless tobacco: Never  Substance Use Topics   Alcohol use: Yes    Alcohol/week: 3.0 standard drinks of alcohol    Types: 3 Cans of beer per week    Comment: daily     ALLERGIES:   Allergies  Allergen Reactions   Codeine Itching    Can take in lower doses   Keflex [Cephalexin] Other (See Comments)    Looked older   Penicillins Other (See Comments)    Childhood     CURRENT MEDICATIONS:   Current Outpatient Medications  Medication Sig Dispense Refill   ACCU-CHEK GUIDE test strip USE 1 TEST STRIP TWICE DAILY     aspirin EC 81 MG tablet Take 81 mg by mouth at bedtime.     clopidogrel (PLAVIX) 75 MG tablet Take 75 mg by mouth daily.     Evolocumab (REPATHA SURECLICK) 140 MG/ML SOAJ Inject 1 pen. into the skin every 14 (fourteen) days. 6 mL 3   fenofibrate (TRICOR) 145 MG tablet Take 1 tablet (145 mg total) by mouth daily. 90 tablet 3   GLYXAMBI 25-5 MG TABS Take 1 tablet by mouth daily.     lisinopril (PRINIVIL,ZESTRIL) 10 MG tablet Take 10 mg by mouth in the morning and at bedtime.     Magnesium 400 MG CAPS Take 400-800 mg by  mouth See admin instructions. 400mg  in am 800mg   in pm     metoprolol succinate (TOPROL XL) 25 MG 24 hr tablet Take 1 tablet (25 mg total) by mouth daily. 90 tablet 3   Multiple Vitamins-Minerals (MULTIVITAMIN WITH MINERALS) tablet Take 1 tablet by mouth daily.     nitroGLYCERIN (NITRODUR - DOSED IN MG/24 HR) 0.2 mg/hr patch APPLY 1/4 (ONE-FOURTH) PATCH TO AFFECTED ELBOW, CHANGE DAILY (Patient taking differently: Place 0.1 mg onto the skin See admin instructions. APPLY 1/2 PATCH TO AFFECTED ELBOW, AS NEEDED FOR TENNIS ELBOW) 30 patch 1   NOVOLIN 70/30 RELION (70-30) 100 UNIT/ML injection Inject 30 Units into the skin 2 (two) times daily with a meal.  SLIDING Scale     omeprazole (PRILOSEC) 20 MG capsule Take 20 mg by mouth daily.     RELION INSULIN SYRINGE 31G X 15/64" 0.5 ML MISC USE 1 THREE TIMES DAILY     rosuvastatin (CRESTOR) 10 MG tablet Take 10 mg by mouth See admin instructions. Taking 5 days a week Mon- Fri ONLY     No current facility-administered medications for this visit.   Facility-Administered Medications Ordered in Other Visits  Medication Dose Route Frequency Provider Last Rate Last Admin   iodixanol (VISIPAQUE) 320 MG/ML injection    PRN Serafina Mitchell, MD   120 mL at 08/20/22 1145    REVIEW OF SYSTEMS:   [X]  denotes positive finding, [ ]  denotes negative finding Cardiac  Comments:  Chest pain or chest pressure:    Shortness of breath upon exertion:    Short of breath when lying flat:    Irregular heart rhythm:        Vascular    Pain in calf, thigh, or hip brought on by ambulation: x   Pain in feet at night that wakes you up from your sleep:     Blood clot in your veins:    Leg swelling:         Pulmonary    Oxygen at home:    Productive cough:     Wheezing:         Neurologic    Sudden weakness in arms or legs:     Sudden numbness in arms or legs:     Sudden onset of difficulty speaking or slurred speech:    Temporary loss of vision in one eye:     Problems with dizziness:         Gastrointestinal    Blood in stool:     Vomited blood:         Genitourinary    Burning when urinating:     Blood in urine:        Psychiatric    Major depression:         Hematologic    Bleeding problems:    Problems with blood clotting too easily:        Skin    Rashes or ulcers:        Constitutional    Fever or chills:      PHYSICAL EXAM:   Vitals:   12/30/22 1501  BP: 116/71  Pulse: 74  Resp: 20  Temp: 98.2 F (36.8 C)  SpO2: 94%  Weight: 203 lb (92.1 kg)  Height: 6\' 1"  (1.854 m)    GENERAL: The patient is a well-nourished male, in no acute distress. The vital signs are documented  above. CARDIAC: There is a regular rate and rhythm.  VASCULAR: Palpable dorsalis pedis pulses  bilaterally PULMONARY: Non-labored respirations MUSCULOSKELETAL: There are no major deformities or cyanosis. NEUROLOGIC: No focal weakness or paresthesias are detected. SKIN: There are no ulcers or rashes noted. PSYCHIATRIC: The patient has a normal affect.  STUDIES:   I have reviewed the following studies: ABI/TBIToday's ABIToday's TBIPrevious ABIPrevious TBI  +-------+-----------+-----------+------------+------------+  Right 1.09       0.60       1.06        0.62          +-------+-----------+-----------+------------+------------+  Left  0.94       0.48       0.74        0.57          +-------+-----------+-----------+------------+------------+   Left: Patent stent with no evidence of stenosis in the Left Popliteal  artery.    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  I discussed that because of the significant calcification within his popliteal arteries that he is at risk for recurrence.  I would like for him to stay on dual antiplatelet therapy indefinitely.  He will follow-up in 6 months with bilateral lower extremity duplex    Durene Cal, IV, MD, FACS [facility] [phone] Pager [phone]

## 2022-12-31 ENCOUNTER — Other Ambulatory Visit: Payer: Self-pay

## 2022-12-31 DIAGNOSIS — I70211 Atherosclerosis of native arteries of extremities with intermittent claudication, right leg: Secondary | ICD-10-CM

## 2022-12-31 DIAGNOSIS — I70213 Atherosclerosis of native arteries of extremities with intermittent claudication, bilateral legs: Secondary | ICD-10-CM

## 2022-12-31 DIAGNOSIS — I739 Peripheral vascular disease, unspecified: Secondary | ICD-10-CM

## 2023-01-02 ENCOUNTER — Other Ambulatory Visit: Payer: Self-pay

## 2023-01-03 ENCOUNTER — Other Ambulatory Visit: Payer: Self-pay

## 2023-01-03 MED ORDER — CLOPIDOGREL BISULFATE 75 MG PO TABS: 75.0000 mg | ORAL_TABLET | Freq: Every day | ORAL | 2 refills | Status: DC

## 2023-01-05 ENCOUNTER — Encounter (HOSPITAL_BASED_OUTPATIENT_CLINIC_OR_DEPARTMENT_OTHER): Payer: Self-pay | Admitting: Emergency Medicine

## 2023-01-05 ENCOUNTER — Other Ambulatory Visit: Payer: Self-pay

## 2023-01-05 ENCOUNTER — Emergency Department (HOSPITAL_BASED_OUTPATIENT_CLINIC_OR_DEPARTMENT_OTHER): Payer: Managed Care, Other (non HMO)

## 2023-01-05 ENCOUNTER — Inpatient Hospital Stay (HOSPITAL_BASED_OUTPATIENT_CLINIC_OR_DEPARTMENT_OTHER)
Admission: EM | Admit: 2023-01-05 | Discharge: 2023-01-14 | DRG: 516 | Disposition: A | Discharge status: Home / Self Care | Payer: Managed Care, Other (non HMO) | Attending: Internal Medicine | Admitting: Internal Medicine

## 2023-01-05 ENCOUNTER — Encounter (HOSPITAL_COMMUNITY): Payer: Self-pay

## 2023-01-05 DIAGNOSIS — Z885 Allergy status to narcotic agent status: Secondary | ICD-10-CM

## 2023-01-05 DIAGNOSIS — Y92009 Unspecified place in unspecified non-institutional (private) residence as the place of occurrence of the external cause: Secondary | ICD-10-CM | POA: Diagnosis not present

## 2023-01-05 DIAGNOSIS — I739 Peripheral vascular disease, unspecified: Secondary | ICD-10-CM | POA: Diagnosis present

## 2023-01-05 DIAGNOSIS — I10 Essential (primary) hypertension: Secondary | ICD-10-CM | POA: Diagnosis present

## 2023-01-05 DIAGNOSIS — Z8249 Family history of ischemic heart disease and other diseases of the circulatory system: Secondary | ICD-10-CM | POA: Diagnosis not present

## 2023-01-05 DIAGNOSIS — N179 Acute kidney failure, unspecified: Secondary | ICD-10-CM | POA: Diagnosis present

## 2023-01-05 DIAGNOSIS — Z794 Long term (current) use of insulin: Secondary | ICD-10-CM

## 2023-01-05 DIAGNOSIS — Z881 Allergy status to other antibiotic agents status: Secondary | ICD-10-CM

## 2023-01-05 DIAGNOSIS — Z79899 Other long term (current) drug therapy: Secondary | ICD-10-CM | POA: Diagnosis not present

## 2023-01-05 DIAGNOSIS — N320 Bladder-neck obstruction: Secondary | ICD-10-CM | POA: Diagnosis present

## 2023-01-05 DIAGNOSIS — Z9689 Presence of other specified functional implants: Secondary | ICD-10-CM | POA: Diagnosis present

## 2023-01-05 DIAGNOSIS — D649 Anemia, unspecified: Secondary | ICD-10-CM | POA: Diagnosis not present

## 2023-01-05 DIAGNOSIS — Z7902 Long term (current) use of antithrombotics/antiplatelets: Secondary | ICD-10-CM | POA: Diagnosis not present

## 2023-01-05 DIAGNOSIS — M4856XA Collapsed vertebra, not elsewhere classified, lumbar region, initial encounter for fracture: Secondary | ICD-10-CM | POA: Diagnosis present

## 2023-01-05 DIAGNOSIS — N486 Induration penis plastica: Secondary | ICD-10-CM | POA: Diagnosis present

## 2023-01-05 DIAGNOSIS — Z880 Allergy status to penicillin: Secondary | ICD-10-CM

## 2023-01-05 DIAGNOSIS — Z7982 Long term (current) use of aspirin: Secondary | ICD-10-CM | POA: Diagnosis not present

## 2023-01-05 DIAGNOSIS — E1151 Type 2 diabetes mellitus with diabetic peripheral angiopathy without gangrene: Secondary | ICD-10-CM | POA: Diagnosis present

## 2023-01-05 DIAGNOSIS — E871 Hypo-osmolality and hyponatremia: Secondary | ICD-10-CM | POA: Diagnosis present

## 2023-01-05 DIAGNOSIS — S32010A Wedge compression fracture of first lumbar vertebra, initial encounter for closed fracture: Principal | ICD-10-CM

## 2023-01-05 DIAGNOSIS — F1020 Alcohol dependence, uncomplicated: Secondary | ICD-10-CM | POA: Diagnosis present

## 2023-01-05 DIAGNOSIS — D696 Thrombocytopenia, unspecified: Secondary | ICD-10-CM | POA: Diagnosis not present

## 2023-01-05 DIAGNOSIS — K219 Gastro-esophageal reflux disease without esophagitis: Secondary | ICD-10-CM | POA: Diagnosis present

## 2023-01-05 DIAGNOSIS — Z87891 Personal history of nicotine dependence: Secondary | ICD-10-CM | POA: Diagnosis not present

## 2023-01-05 DIAGNOSIS — S32000A Wedge compression fracture of unspecified lumbar vertebra, initial encounter for closed fracture: Secondary | ICD-10-CM | POA: Diagnosis present

## 2023-01-05 DIAGNOSIS — E785 Hyperlipidemia, unspecified: Secondary | ICD-10-CM | POA: Diagnosis present

## 2023-01-05 DIAGNOSIS — W1839XA Other fall on same level, initial encounter: Secondary | ICD-10-CM | POA: Diagnosis present

## 2023-01-05 DIAGNOSIS — E109 Type 1 diabetes mellitus without complications: Secondary | ICD-10-CM | POA: Diagnosis present

## 2023-01-05 DIAGNOSIS — Z95820 Peripheral vascular angioplasty status with implants and grafts: Secondary | ICD-10-CM

## 2023-01-05 LAB — CBC WITH DIFFERENTIAL/PLATELET
Abs Immature Granulocytes: 0.05 10*3/uL (ref 0.00–0.07)
Basophils Absolute: 0 10*3/uL (ref 0.0–0.1)
Basophils Relative: 0 %
Eosinophils Absolute: 0 10*3/uL (ref 0.0–0.5)
Eosinophils Relative: 0 %
HCT: 37.7 % — ABNORMAL LOW (ref 39.0–52.0)
Hemoglobin: 13.4 g/dL (ref 13.0–17.0)
Immature Granulocytes: 1 %
Lymphocytes Relative: 6 %
Lymphs Abs: 0.5 10*3/uL — ABNORMAL LOW (ref 0.7–4.0)
MCH: 33 pg (ref 26.0–34.0)
MCHC: 35.5 g/dL (ref 30.0–36.0)
MCV: 92.9 fL (ref 80.0–100.0)
Monocytes Absolute: 0.6 10*3/uL (ref 0.1–1.0)
Monocytes Relative: 6 %
Neutro Abs: 8.5 10*3/uL — ABNORMAL HIGH (ref 1.7–7.7)
Neutrophils Relative %: 87 %
Platelets: 130 10*3/uL — ABNORMAL LOW (ref 150–400)
RBC: 4.06 MIL/uL — ABNORMAL LOW (ref 4.22–5.81)
RDW: 12 % (ref 11.5–15.5)
WBC: 9.7 10*3/uL (ref 4.0–10.5)
nRBC: 0 % (ref 0.0–0.2)

## 2023-01-05 LAB — BASIC METABOLIC PANEL
Anion gap: 16 — ABNORMAL HIGH (ref 5–15)
BUN: 17 mg/dL (ref 8–23)
CO2: 23 mmol/L (ref 22–32)
Calcium: 9 mg/dL (ref 8.9–10.3)
Chloride: 97 mmol/L — ABNORMAL LOW (ref 98–111)
Creatinine, Ser: 0.79 mg/dL (ref 0.61–1.24)
GFR, Estimated: >60 mL/min (ref >60)
Glucose, Bld: 128 mg/dL — ABNORMAL HIGH (ref 70–99)
Potassium: 4 mmol/L (ref 3.5–5.1)
Sodium: 136 mmol/L (ref 135–145)

## 2023-01-05 LAB — GLUCOSE, CAPILLARY: Glucose-Capillary: 187 mg/dL — ABNORMAL HIGH (ref 70–99)

## 2023-01-05 LAB — CBG MONITORING, ED
Glucose-Capillary: 146 mg/dL — ABNORMAL HIGH (ref 70–99)
Glucose-Capillary: 141 mg/dL — ABNORMAL HIGH (ref 70–99)

## 2023-01-05 MED ORDER — FOLIC ACID 1 MG PO TABS
1.0000 mg | ORAL_TABLET | Freq: Every day | ORAL | Status: DC
  Administered 2023-01-05 – 2023-01-14 (×10): 1 mg via ORAL
  Filled 2023-01-05 (×10): qty 1

## 2023-01-05 MED ORDER — BISACODYL 5 MG PO TBEC: 5.0000 mg | DELAYED_RELEASE_TABLET | Freq: Every day | ORAL | Status: DC | PRN

## 2023-01-05 MED ORDER — THIAMINE HCL 100 MG/ML IJ SOLN: 100.0000 mg | Freq: Every day | INTRAMUSCULAR | Status: DC

## 2023-01-05 MED ORDER — FENOFIBRATE 160 MG PO TABS
160.0000 mg | ORAL_TABLET | Freq: Every day | ORAL | Status: DC
  Administered 2023-01-06 – 2023-01-14 (×9): 160 mg via ORAL
  Filled 2023-01-05 (×9): qty 1

## 2023-01-05 MED ORDER — OXYCODONE HCL 5 MG PO TABS
5.0000 mg | ORAL_TABLET | Freq: Once | ORAL | Status: AC
  Administered 2023-01-05: 5 mg via ORAL
  Filled 2023-01-05: qty 1

## 2023-01-05 MED ORDER — FENTANYL CITRATE PF 50 MCG/ML IJ SOSY
50.0000 ug | PREFILLED_SYRINGE | Freq: Once | INTRAMUSCULAR | Status: AC
  Administered 2023-01-05: 50 ug via INTRAVENOUS
  Filled 2023-01-05: qty 1

## 2023-01-05 MED ORDER — METHOCARBAMOL 1000 MG/10ML IJ SOLN
500.0000 mg | Freq: Four times a day (QID) | INTRAVENOUS | Status: DC | PRN
  Filled 2023-01-05: qty 5

## 2023-01-05 MED ORDER — HYDROMORPHONE HCL 1 MG/ML IJ SOLN
1.0000 mg | INTRAMUSCULAR | Status: DC | PRN
  Administered 2023-01-05 – 2023-01-14 (×49): 1 mg via INTRAVENOUS
  Filled 2023-01-05 (×51): qty 1

## 2023-01-05 MED ORDER — ROSUVASTATIN CALCIUM 5 MG PO TABS
10.0000 mg | ORAL_TABLET | Freq: Every day | ORAL | Status: DC
  Administered 2023-01-05 – 2023-01-13 (×9): 10 mg via ORAL
  Filled 2023-01-05 (×9): qty 2

## 2023-01-05 MED ORDER — PANTOPRAZOLE SODIUM 40 MG PO TBEC
40.0000 mg | DELAYED_RELEASE_TABLET | Freq: Every day | ORAL | Status: DC
  Administered 2023-01-06 – 2023-01-14 (×9): 40 mg via ORAL
  Filled 2023-01-05 (×9): qty 1

## 2023-01-05 MED ORDER — LACTATED RINGERS IV SOLN: INTRAVENOUS | Status: DC

## 2023-01-05 MED ORDER — ONDANSETRON HCL 4 MG/2ML IJ SOLN
4.0000 mg | Freq: Four times a day (QID) | INTRAMUSCULAR | Status: DC | PRN
  Administered 2023-01-05 – 2023-01-06 (×2): 4 mg via INTRAVENOUS
  Filled 2023-01-05 (×2): qty 2

## 2023-01-05 MED ORDER — ONDANSETRON 4 MG PO TBDP
4.0000 mg | ORAL_TABLET | Freq: Once | ORAL | Status: AC
  Administered 2023-01-05: 4 mg via ORAL
  Filled 2023-01-05: qty 1

## 2023-01-05 MED ORDER — MAGNESIUM OXIDE -MG SUPPLEMENT 400 (240 MG) MG PO TABS
400.0000 mg | ORAL_TABLET | Freq: Two times a day (BID) | ORAL | Status: DC
  Administered 2023-01-05 – 2023-01-14 (×18): 400 mg via ORAL
  Filled 2023-01-05 (×18): qty 1

## 2023-01-05 MED ORDER — INSULIN ASPART 100 UNIT/ML IJ SOLN
0.0000 [IU] | Freq: Every day | INTRAMUSCULAR | Status: DC
  Administered 2023-01-06 – 2023-01-13 (×7): 2 [IU] via SUBCUTANEOUS

## 2023-01-05 MED ORDER — ASPIRIN 81 MG PO TBEC
81.0000 mg | DELAYED_RELEASE_TABLET | Freq: Every day | ORAL | Status: DC
  Administered 2023-01-05 – 2023-01-13 (×9): 81 mg via ORAL
  Filled 2023-01-05 (×9): qty 1

## 2023-01-05 MED ORDER — POLYETHYLENE GLYCOL 3350 17 G PO PACK: 17.0000 g | PACK | Freq: Every day | ORAL | Status: DC | PRN

## 2023-01-05 MED ORDER — DOCUSATE SODIUM 100 MG PO CAPS
100.0000 mg | ORAL_CAPSULE | Freq: Two times a day (BID) | ORAL | Status: DC
  Administered 2023-01-05 – 2023-01-14 (×18): 100 mg via ORAL
  Filled 2023-01-05 (×18): qty 1

## 2023-01-05 MED ORDER — ADULT MULTIVITAMIN W/MINERALS CH
1.0000 | ORAL_TABLET | Freq: Every day | ORAL | Status: DC
  Administered 2023-01-06 – 2023-01-14 (×9): 1 via ORAL
  Filled 2023-01-05 (×9): qty 1

## 2023-01-05 MED ORDER — ONDANSETRON HCL 4 MG/2ML IJ SOLN
4.0000 mg | Freq: Once | INTRAMUSCULAR | Status: AC
  Administered 2023-01-05: 4 mg via INTRAVENOUS
  Filled 2023-01-05: qty 2

## 2023-01-05 MED ORDER — ACETAMINOPHEN 650 MG RE SUPP: 650.0000 mg | Freq: Four times a day (QID) | RECTAL | Status: DC | PRN

## 2023-01-05 MED ORDER — THIAMINE MONONITRATE 100 MG PO TABS
100.0000 mg | ORAL_TABLET | Freq: Every day | ORAL | Status: DC
  Administered 2023-01-05 – 2023-01-14 (×10): 100 mg via ORAL
  Filled 2023-01-05 (×10): qty 1

## 2023-01-05 MED ORDER — LORAZEPAM 1 MG PO TABS: 1.0000 mg | ORAL_TABLET | ORAL | Status: AC | PRN

## 2023-01-05 MED ORDER — HYDRALAZINE HCL 20 MG/ML IJ SOLN: 5.0000 mg | INTRAMUSCULAR | Status: DC | PRN

## 2023-01-05 MED ORDER — MAGNESIUM 200 MG PO TABS
400.0000 mg | ORAL_TABLET | Freq: Two times a day (BID) | ORAL | Status: DC
  Filled 2023-01-05: qty 2

## 2023-01-05 MED ORDER — LISINOPRIL 10 MG PO TABS
10.0000 mg | ORAL_TABLET | Freq: Two times a day (BID) | ORAL | Status: DC
  Administered 2023-01-05 – 2023-01-06 (×2): 10 mg via ORAL
  Filled 2023-01-05 (×2): qty 1

## 2023-01-05 MED ORDER — CYCLOBENZAPRINE HCL 10 MG PO TABS
10.0000 mg | ORAL_TABLET | Freq: Once | ORAL | Status: AC
  Administered 2023-01-05: 10 mg via ORAL
  Filled 2023-01-05: qty 1

## 2023-01-05 MED ORDER — LIDOCAINE 5 % EX PTCH
3.0000 | MEDICATED_PATCH | CUTANEOUS | Status: DC
  Administered 2023-01-05 – 2023-01-08 (×2): 3 via TRANSDERMAL
  Filled 2023-01-05 (×7): qty 3

## 2023-01-05 MED ORDER — CLOPIDOGREL BISULFATE 75 MG PO TABS
75.0000 mg | ORAL_TABLET | Freq: Every day | ORAL | Status: DC
  Administered 2023-01-05 – 2023-01-06 (×2): 75 mg via ORAL
  Filled 2023-01-05 (×2): qty 1

## 2023-01-05 MED ORDER — METOPROLOL SUCCINATE ER 25 MG PO TB24
12.5000 mg | ORAL_TABLET | Freq: Every day | ORAL | Status: DC
  Administered 2023-01-05 – 2023-01-13 (×9): 12.5 mg via ORAL
  Filled 2023-01-05 (×9): qty 1

## 2023-01-05 MED ORDER — OXYCODONE HCL 5 MG PO TABS
5.0000 mg | ORAL_TABLET | ORAL | Status: DC | PRN
  Filled 2023-01-05: qty 1

## 2023-01-05 MED ORDER — INSULIN ASPART 100 UNIT/ML IJ SOLN
0.0000 [IU] | Freq: Three times a day (TID) | INTRAMUSCULAR | Status: DC
  Administered 2023-01-06: 15 [IU] via SUBCUTANEOUS
  Administered 2023-01-07: 3 [IU] via SUBCUTANEOUS
  Administered 2023-01-07: 5 [IU] via SUBCUTANEOUS
  Administered 2023-01-08: 3 [IU] via SUBCUTANEOUS
  Administered 2023-01-08: 8 [IU] via SUBCUTANEOUS
  Administered 2023-01-08 – 2023-01-09 (×2): 5 [IU] via SUBCUTANEOUS
  Administered 2023-01-09: 8 [IU] via SUBCUTANEOUS
  Administered 2023-01-10: 6 [IU] via SUBCUTANEOUS
  Administered 2023-01-10: 8 [IU] via SUBCUTANEOUS
  Administered 2023-01-11: 3 [IU] via SUBCUTANEOUS
  Administered 2023-01-11: 2 [IU] via SUBCUTANEOUS
  Administered 2023-01-11: 6 [IU] via SUBCUTANEOUS
  Administered 2023-01-12: 2 [IU] via SUBCUTANEOUS
  Administered 2023-01-12 (×2): 5 [IU] via SUBCUTANEOUS
  Administered 2023-01-13: 3 [IU] via SUBCUTANEOUS
  Administered 2023-01-13: 5 [IU] via SUBCUTANEOUS
  Administered 2023-01-13: 8 [IU] via SUBCUTANEOUS
  Administered 2023-01-14: 11 [IU] via SUBCUTANEOUS

## 2023-01-05 MED ORDER — ACETAMINOPHEN 325 MG PO TABS
650.0000 mg | ORAL_TABLET | Freq: Four times a day (QID) | ORAL | Status: DC | PRN
  Administered 2023-01-11 – 2023-01-14 (×6): 650 mg via ORAL
  Filled 2023-01-05 (×6): qty 2

## 2023-01-05 MED ORDER — ONDANSETRON HCL 4 MG PO TABS: 4.0000 mg | ORAL_TABLET | Freq: Four times a day (QID) | ORAL | Status: DC | PRN

## 2023-01-05 NOTE — ED Notes (Signed)
Pt holding his breath and shallow breathing due to pain. Placed on 1LPM  via [place]. No tingling or numbness noted in his lower extremities. Pt cold, provided with multiple blankets and placed in position of comfort.

## 2023-01-05 NOTE — ED Notes (Signed)
O2 sat noted to be 90% on RA; pt alert, talking and requesting pain medication; O2 applied at 2L  prior to medication administration

## 2023-01-05 NOTE — Progress Notes (Signed)
Plan of Care Note for accepted transfer   Patient: JUD FANGUY MRN: [id number]   [place]: 01/05/2023  Facility requesting transfer: [facility] Requesting Provider: Ronnald Nian Reason for transfer: Compression fracture  Facility course: Patient with h/o DM, HTN, and PVD presenting with a fall.  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  Glucose 50 this AM, went to take glucose tab and passed out, hitting his low back.  No LOC.  Compression fracture at L1 with 60% height loss.  Glucose ok with EMS and in ER.  Still having pain, feels unable to get around safely.  Ordered TLSO brace.  Will plan to obs on Med Surg for pain control.  In the meantime, if he feels better with the brace and wants to go home that is also reasonable.  Plan of care: The patient is accepted for observation to [place]  unit, at [facility].    Author: Karmen Bongo, MD 01/05/2023  Check [email or web address] for on-call coverage.  Nursing staff, Please call [place] number on Amion as soon as patient's arrival, so appropriate admitting provider can evaluate the pt.

## 2023-01-05 NOTE — H&P (Signed)
History and Physical    Patient: Shawn Daniels BPZ:[id number] DOB: 14-Jul-1956 DOA: 01/05/2023 DOS: the patient was seen and examined on 01/05/2023 PCP: Ignatius Specking, MD  Patient coming from: Home - lives with wife; NOK: Wife, Shawn Daniels, [phone]   Chief Complaint:  back pain  HPI: Shawn Daniels is a 67 y.o. male with medical history significant of DM, HTN, and PVD presenting with a fall.  He reports that his blood sugar was low.  He got up to get some glucose tabs and fell onto the fire place hearth.  He had to crawl to the bathroom.  He doesn't think his glucose is low often.  He does not normally have back pain.  He is opposed to the lidoderm, doesn't think it will help.  Brace isn't helping much.  No radicular symptoms.    ER Course:  [facility] to [facility] transfer:  Glucose 50 this AM, went to take glucose tab and passed out, hitting his low back. No LOC. Compression fracture at L1 with 60% height loss. Glucose ok with EMS and in ER. Still having pain, feels unable to get around safely. Ordered TLSO brace. Will plan to obs on Med Surg for pain control. In the meantime, if he feels better with the brace and wants to go home that is also reasonable.      Review of Systems: As mentioned in the history of present illness. All other systems reviewed and are negative. Past Medical History:  Diagnosis Date   Diabetes mellitus without complication (HCC)    GERD (gastroesophageal reflux disease)    Hypertension    Peripheral vascular disease (HCC)    Past Surgical History:  Procedure Laterality Date   ABDOMINAL AORTOGRAM W/LOWER EXTREMITY Right 08/20/2022   Procedure: ABDOMINAL AORTOGRAM W/LOWER EXTREMITY;  Surgeon: Nada Libman, MD;  Location: [facility];  Service: Cardiovascular;  Laterality: Right;   ABDOMINAL AORTOGRAM W/LOWER EXTREMITY N/A 11/19/2022   Procedure: ABDOMINAL AORTOGRAM W/LOWER EXTREMITY;  Surgeon: Nada Libman, MD;  Location: [facility];  Service: Cardiovascular;  Laterality: N/A;   ENDARTERECTOMY FEMORAL Right 04/26/2022   Procedure: RIGHT FEMORAL ENDARTERECTOMY;  Surgeon: Nada Libman, MD;  Location: [facility] OR;  Service: Vascular;  Laterality: Right;   INTRAVASCULAR LITHOTRIPSY Left 11/19/2022   Procedure: INTRAVASCULAR LITHOTRIPSY;  Surgeon: Nada Libman, MD;  Location: [facility];  Service: Cardiovascular;  Laterality: Left;   KNEE SURGERY     LOWER EXTREMITY ANGIOGRAPHY N/A 01/01/2022   Procedure: LOWER EXTREMITY ANGIOGRAPHY;  Surgeon: Elder Negus, MD;  Location: [facility];  Service: Cardiovascular;  Laterality: N/A;   PATCH ANGIOPLASTY Right 04/26/2022   Procedure: PATCH ANGIOPLASTY OF RIGHT FEMORAL ARTERY USING XENOSURE BOVINE PERCARDIUM PATCH;  Surgeon: Nada Libman, MD;  Location: [facility] OR;  Service: Vascular;  Laterality: Right;   PENILE PROSTHESIS IMPLANT     PERIPHERAL VASCULAR ATHERECTOMY  08/20/2022   Procedure: PERIPHERAL VASCULAR ATHERECTOMY;  Surgeon: Nada Libman, MD;  Location: [facility];  Service: Cardiovascular;;  popliteal   PERIPHERAL VASCULAR BALLOON ANGIOPLASTY  01/08/2022   Procedure: PERIPHERAL VASCULAR BALLOON ANGIOPLASTY;  Surgeon: Letzy Gullickson Decamp, MD;  Location: [facility];  Service: Cardiovascular;;   PERIPHERAL VASCULAR INTERVENTION Left 11/19/2022   Procedure: PERIPHERAL VASCULAR INTERVENTION;  Surgeon: Nada Libman, MD;  Location: [facility];  Service: Cardiovascular;  Laterality: Left;   WRIST SURGERY     Social History:  reports that  he quit smoking about 15 years ago. His smoking use included cigarettes and cigars. He has a 45.00 pack-year smoking history. He has never used smokeless tobacco. He reports current alcohol use of about 3.0 standard drinks of alcohol per week. He reports that he does not use drugs.  Allergies  Allergen Reactions   Codeine Itching and Other (See Comments)    Can take/tolerate in lower doses   Keflex  [Cephalexin] Other (See Comments)    "Shriveled my skin"   Penicillins Other (See Comments)    Reaction from childhood not recalled    Family History  Problem Relation Age of Onset   Heart failure Father     Prior to Admission medications   Medication Sig Start Date End Date Taking? Authorizing Provider  ACCU-CHEK GUIDE test strip USE 1 TEST STRIP TWICE DAILY 04/25/20   [provider]  aspirin EC 81 MG tablet Take 81 mg by mouth at bedtime.    [provider]  clopidogrel (PLAVIX) 75 MG tablet Take 1 tablet (75 mg total) by mouth daily. 01/03/23   Nahser, Wonda Cheng, MD  Evolocumab (REPATHA SURECLICK) 409 MG/ML SOAJ Inject 1 pen. into the skin every 14 (fourteen) days. 04/30/22   Nahser, Wonda Cheng, MD  fenofibrate (TRICOR) 145 MG tablet Take 1 tablet (145 mg total) by mouth daily. 06/20/22   Nahser, Wonda Cheng, MD  GLYXAMBI 25-5 MG TABS Take 1 tablet by mouth daily. 05/30/20   [provider]  lisinopril (PRINIVIL,ZESTRIL) 10 MG tablet Take 10 mg by mouth in the morning and at bedtime.    [provider]  Magnesium 400 MG CAPS Take 400-800 mg by mouth See admin instructions. 400mg  in am 800mg   in pm    [provider]  metoprolol succinate (TOPROL XL) 25 MG 24 hr tablet Take 1 tablet (25 mg total) by mouth daily. 10/08/22   Nahser, Wonda Cheng, MD  Multiple Vitamins-Minerals (MULTIVITAMIN WITH MINERALS) tablet Take 1 tablet by mouth daily.    [provider]  nitroGLYCERIN (NITRODUR - DOSED IN MG/24 HR) 0.2 mg/hr patch APPLY 1/4 (ONE-FOURTH) PATCH TO AFFECTED ELBOW, CHANGE DAILY Patient taking differently: Place 0.1 mg onto the skin daily as needed (to affected elbow). 09/09/22   Hudnall, Sharyn Lull, MD  NOVOLIN 70/30 RELION (70-30) 100 UNIT/ML injection Inject 30 Units into the skin 2 (two) times daily with a meal. SLIDING Scale 06/05/20   [provider]  omeprazole (PRILOSEC) 20 MG capsule Take 20 mg by mouth daily. 05/10/18   [provider]  [place] X 15/64" 0.5 ML MISC USE 1 THREE TIMES DAILY 05/04/20   [provider]  rosuvastatin (CRESTOR) 10 MG tablet Take 10 mg by mouth See admin instructions. Taking 5 days a week Mon- Fri ONLY    [provider]    Physical Exam: Vitals:   01/05/23 1215 01/05/23 1230 01/05/23 1440 01/05/23 1541  BP: (!) 168/94 (!) 158/92 (!) 181/91 (!) 173/84  Pulse: 78 80 91 91  Resp: 16 15 16 17   Temp:   98.1 F (36.7 C) 98.3 F (36.8 C)  TempSrc:   Oral Oral  SpO2: 95% 95% 95% 96%  Weight:      Height:       General:  Appears very uncomfortable and miserable despite TLSO Eyes:  EOMI, normal lids, iris ENT:  grossly normal hearing, lips & tongue, mmm Neck:  no LAD, masses or thyromegaly Cardiovascular:  RRR, no m/r/g. No  LE edema.  Respiratory:   CTA bilaterally with no wheezes/rales/rhonchi.  Normal respiratory effort. Abdomen:  soft, NT, ND Skin:  no rash or induration seen on limited exam Musculoskeletal:  grossly normal tone BUE/BLE, good ROM, no bony abnormality Psychiatric:  blunted mood and affect, speech fluent and appropriate, AOx3 Neurologic:  CN 2-12 grossly intact, moves all extremities in coordinated fashion   Radiological Exams on Admission: Independently reviewed - see discussion in A/P where applicable  DG Pelvis 1-2 Views  Result Date: 01/05/2023 CLINICAL DATA:  67 year old male status post fall with pain. EXAM: PELVIS - 1-2 VIEW COMPARISON:  CT Abdomen and Pelvis 06/01/2017. FINDINGS: AP view the pelvis 1050 hours. Extensive Aortoiliac calcified atherosclerosis. Femoral heads are normally located. Pelvis appears stable and intact. Grossly intact proximal femurs. Negative lower abdominal and pelvic visceral contours. A penile implant may be partially visible now. IMPRESSION: 1.  No acute fracture or dislocation identified about the pelvis. 2.  Aortic Atherosclerosis (ICD10-I70.0). Electronically Signed   By: Genevie Ann M.D.    On: 01/05/2023 11:10   DG Chest 2 View  Result Date: 01/05/2023 CLINICAL DATA:  67 year old male with fall and back pain. EXAM: CHEST - 2 VIEW COMPARISON:  Cardiac CT 07/27/2021. FINDINGS: AP and lateral views of the chest at 1056 hours. Lower lung volumes. Mediastinal contours remain normal. Visualized tracheal air column is within normal limits. No pneumothorax, pleural effusion, pulmonary edema or confluent lung opacity. Chronic right posterior 7th rib fracture was present on the CT last year. No acute osseous abnormality identified. Paucity of bowel gas in the visible abdomen. IMPRESSION: No acute cardiopulmonary abnormality or acute traumatic injury identified. Electronically Signed   By: Genevie Ann M.D.   On: 01/05/2023 11:08   DG Lumbar Spine Complete  Result Date: 01/05/2023 CLINICAL DATA:  Back pain.  Fall. EXAM: LUMBAR SPINE - COMPLETE 4+ VIEW COMPARISON:  CT renal stone protocol 06/01/2017 FINDINGS: Anterior compression fracture is present at L1 with 60% loss of height. No definite retropulsed bone is present. Vertebral body heights are otherwise maintained. Alignment is anatomic. Mild straightening of the normal cervical lordosis is present. Atherosclerotic calcifications are present within the aorta without evidence for aneurysm. IMPRESSION: 1. Anterior compression fracture at L1 with 60% loss of height. This was not present in 2018 and may be acute. 2. Aortic atherosclerosis. Electronically Signed   By: San Morelle M.D.   On: 01/05/2023 11:06    EKG: not done   Labs on Admission: I have personally reviewed the available labs and imaging studies at the time of the admission.  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)   PAD  (peripheral artery disease) (HCC)   Alcohol dependence (HCC)    Lumbar compression fracture -Mechanical fall resulting in lumbar compression fracture -IR consult in AM for kyphoplasty -Will not make patient NPO at this time since insurance authorization is usually required and this can take several days -Kyphoplasty is indicated for patients with a >15% height loss with fracture in <3 months as well as for acute, intractable pain associated with fracture -Start Lovenox for DVT prophylaxis -Pain control with Dilaudid, Robaxin, and lidocaine patch pre-procedure -May need PT evaluation post-procedure but currently unable to participate due to pain  DM -A1c at  last check was 7.0 -hold Glyxambi -Hold 70/30 given hypoglycemia -Cover with moderate-scale SSI   HTN -Continue Toprol XL, lisinopril  HLD -Continue Repatha, fenofibrate, rosuvastatin  PVD -Continue ASA and Plavix  ETOH dependence -Patient with chronic ETOH dependence -He denies significant issue with ETOH but his wife acknowledges dependence that has actually led to recent marital separation -His wife was out of town all weekend and he may have been drinking excessively this weekend while he was away  -CIWA protocol -Folate, thiamine, and MVI ordered -Will provide symptom-triggered BZD (ativan per CIWA protocol) only since the patient is able to communicate; is not showing current signs of delirium; and has no history of severe withdrawal. -TOC team consult for substance abuse counseling -Consider offering a medication for Alcohol Use Disorder at the time of d/c, to include Disulfuram; Naltrexone; or Acamprosate.    Advance Care Planning:   Code Status: Full Code  - Code status was discussed with the patient and/or family at the time of admission.  The patient would want to receive full resuscitative measures at this time.   Consults: IR; TOC team; nutrition; likely to need PT/OT once pain is controlled  DVT  Prophylaxis: SCDs  Family Communication: None present; I spoke with the wife (estranged) at the time of admission by telephone  Severity of Illness: The appropriate patient status for this patient is INPATIENT. Inpatient status is judged to be reasonable and necessary in order to provide the required intensity of service to ensure the patient's safety. The patient's presenting symptoms, physical exam findings, and initial radiographic and laboratory data in the context of their chronic comorbidities is felt to place them at high risk for further clinical deterioration. Furthermore, it is not anticipated that the patient will be medically stable for discharge from the hospital within 2 midnights of admission.   * I certify that at the point of admission it is my clinical judgment that the patient will require inpatient hospital care spanning beyond 2 midnights from the point of admission due to high intensity of service, high risk for further deterioration and high frequency of surveillance required.*  Author: Jonah Blue, MD 01/05/2023 6:38 PM  For on call review [email or web address].

## 2023-01-05 NOTE — ED Provider Notes (Signed)
[facility] EMERGENCY DEPARTMENT Provider Note   CSN: [id number] Arrival date & time: 01/05/23  0932     History  Chief Complaint  Patient presents with   Shawn Daniels    Shawn Daniels is a 67 y.o. male.  Patient woke up this morning and was checking his blood sugar because he was feeling lightheaded and his blood sugar was 50.  He ended up falling and hitting his low back.  Did not hit his head or lose consciousness.  He has no headache or neck pain.  Blood sugar tablets were taken at home and blood sugar was normal with EMS.  He comes here with severe low back pain.  Denies any weakness or numbness.  Denies any nausea or vomiting or abdominal pain.  Nothing makes it worse or better.  Hurts to move or take a deep breath.  Patient is on Plavix.  The history is provided by the patient.       Home Medications Prior to Admission medications   Medication Sig Start Date End Date Taking? Authorizing Provider  ACCU-CHEK GUIDE test strip USE 1 TEST STRIP TWICE DAILY 04/25/20   [provider]  aspirin EC 81 MG tablet Take 81 mg by mouth at bedtime.    [provider]  clopidogrel (PLAVIX) 75 MG tablet Take 1 tablet (75 mg total) by mouth daily. 01/03/23   Nahser, Deloris Ping, MD  Evolocumab (REPATHA SURECLICK) 140 MG/ML SOAJ Inject 1 pen. into the skin every 14 (fourteen) days. 04/30/22   Nahser, Deloris Ping, MD  fenofibrate (TRICOR) 145 MG tablet Take 1 tablet (145 mg total) by mouth daily. 06/20/22   Nahser, Deloris Ping, MD  GLYXAMBI 25-5 MG TABS Take 1 tablet by mouth daily. 05/30/20   [provider]  lisinopril (PRINIVIL,ZESTRIL) 10 MG tablet Take 10 mg by mouth in the morning and at bedtime.    [provider]  Magnesium 400 MG CAPS Take 400-800 mg by mouth See admin instructions. 400mg  in am 800mg   in pm    [provider]  metoprolol succinate (TOPROL XL) 25 MG 24 hr tablet Take 1 tablet (25 mg total) by mouth daily. 10/08/22   Nahser, ,  MD  Multiple Vitamins-Minerals (MULTIVITAMIN WITH MINERALS) tablet Take 1 tablet by mouth daily.    [provider]  nitroGLYCERIN (NITRODUR - DOSED IN MG/24 HR) 0.2 mg/hr patch APPLY 1/4 (ONE-FOURTH) PATCH TO AFFECTED ELBOW, CHANGE DAILY Patient taking differently: Place 0.1 mg onto the skin See admin instructions. APPLY 1/2 PATCH TO AFFECTED ELBOW, AS NEEDED FOR TENNIS ELBOW 09/09/22   Hudnall, Deloris Ping, MD  NOVOLIN 70/30 RELION (70-30) 100 UNIT/ML injection Inject 30 Units into the skin 2 (two) times daily with a meal. SLIDING Scale 06/05/20   [provider]  omeprazole (PRILOSEC) 20 MG capsule Take 20 mg by mouth daily. 05/10/18   [provider]  RELION INSULIN SYRINGE 31G X 15/64" 0.5 ML MISC USE 1 THREE TIMES DAILY 05/04/20   [provider]  rosuvastatin (CRESTOR) 10 MG tablet Take 10 mg by mouth See admin instructions. Taking 5 days a week Mon- Fri ONLY    [provider]      Allergies    Codeine, Keflex [cephalexin], and Penicillins    Review of Systems   Review of Systems  Physical Exam Updated Vital Signs BP (!) 168/94   Pulse 78   Temp 98.1 F (36.7 C) (Oral)   Resp 16   Ht  6\' 1"  (1.854 m)   Wt 93.4 kg   SpO2 95%   BMI 27.18 kg/m  Physical Exam Vitals and nursing note reviewed.  Constitutional:      General: He is not in acute distress.    Appearance: He is well-developed. He is ill-appearing.  HENT:     Head: Normocephalic and atraumatic.     Nose: Nose normal.     Mouth/Throat:     Mouth: Mucous membranes are moist.  Eyes:     Extraocular Movements: Extraocular movements intact.     Conjunctiva/sclera: Conjunctivae normal.     Pupils: Pupils are equal, round, and reactive to light.  Neck:     Comments: No midline spinal tenderness of the cervical and upper thoracic spine Cardiovascular:     Rate and Rhythm: Normal rate and regular rhythm.     Heart sounds: No murmur heard. Pulmonary:     Effort: Pulmonary effort  is normal. No respiratory distress.     Breath sounds: Normal breath sounds.  Abdominal:     Palpations: Abdomen is soft.     Tenderness: There is no abdominal tenderness.  Musculoskeletal:        General: Tenderness present. No swelling.     Cervical back: Normal range of motion and neck supple. No tenderness.     Comments: Tenderness to the low back  Skin:    General: Skin is warm and dry.     Capillary Refill: Capillary refill takes less than 2 seconds.  Neurological:     General: No focal deficit present.     Mental Status: He is alert and oriented to person, place, and time.     Sensory: No sensory deficit.     Motor: No weakness.     Comments: 5+ out of 5 strength throughout, normal sensation, no drift, normal finger-nose-finger, normal speech  Psychiatric:        Mood and Affect: Mood normal.     ED Results / Procedures / Treatments   Labs (all labs ordered are listed, but only abnormal results are displayed) Labs Reviewed  CBC WITH DIFFERENTIAL/PLATELET - Abnormal; Notable for the following components:      Result Value   RBC 4.06 (*)    HCT 37.7 (*)    Platelets 130 (*)    Neutro Abs 8.5 (*)    Lymphs Abs 0.5 (*)    All other components within normal limits  BASIC METABOLIC PANEL - Abnormal; Notable for the following components:   Chloride 97 (*)    Glucose, Bld 128 (*)    Anion gap 16 (*)    All other components within normal limits  CBG MONITORING, ED - Abnormal; Notable for the following components:   Glucose-Capillary 146 (*)    All other components within normal limits    EKG None  Radiology DG Pelvis 1-2 Views  Result Date: 01/05/2023 CLINICAL DATA:  67 year old male status post fall with pain. EXAM: PELVIS - 1-2 VIEW COMPARISON:  CT Abdomen and Pelvis 06/01/2017. FINDINGS: AP view the pelvis 1050 hours. Extensive Aortoiliac calcified atherosclerosis. Femoral heads are normally located. Pelvis appears stable and intact. Grossly intact proximal femurs.  Negative lower abdominal and pelvic visceral contours. A penile implant may be partially visible now. IMPRESSION: 1.  No acute fracture or dislocation identified about the pelvis. 2.  Aortic Atherosclerosis (ICD10-I70.0). Electronically Signed   By: Genevie Ann M.D.   On: 01/05/2023 11:10   DG Chest 2 View  Result Date: 01/05/2023  CLINICAL DATA:  67 year old male with fall and back pain. EXAM: CHEST - 2 VIEW COMPARISON:  Cardiac CT 07/27/2021. FINDINGS: AP and lateral views of the chest at 1056 hours. Lower lung volumes. Mediastinal contours remain normal. Visualized tracheal air column is within normal limits. No pneumothorax, pleural effusion, pulmonary edema or confluent lung opacity. Chronic right posterior 7th rib fracture was present on the CT last year. No acute osseous abnormality identified. Paucity of bowel gas in the visible abdomen. IMPRESSION: No acute cardiopulmonary abnormality or acute traumatic injury identified. Electronically Signed   By: Genevie Ann M.D.   On: 01/05/2023 11:08   DG Lumbar Spine Complete  Result Date: 01/05/2023 CLINICAL DATA:  Back pain.  Fall. EXAM: LUMBAR SPINE - COMPLETE 4+ VIEW COMPARISON:  CT renal stone protocol 06/01/2017 FINDINGS: Anterior compression fracture is present at L1 with 60% loss of height. No definite retropulsed bone is present. Vertebral body heights are otherwise maintained. Alignment is anatomic. Mild straightening of the normal cervical lordosis is present. Atherosclerotic calcifications are present within the aorta without evidence for aneurysm. IMPRESSION: 1. Anterior compression fracture at L1 with 60% loss of height. This was not present in 2018 and may be acute. 2. Aortic atherosclerosis. Electronically Signed   By: San Morelle M.D.   On: 01/05/2023 11:06    Procedures Procedures    Medications Ordered in ED Medications  oxyCODONE (Oxy IR/ROXICODONE) immediate release tablet 5 mg (5 mg Oral Given 01/05/23 1025)  ondansetron  (ZOFRAN-ODT) disintegrating tablet 4 mg (4 mg Oral Given 01/05/23 1025)  fentaNYL (SUBLIMAZE) injection 50 mcg (50 mcg Intravenous Given 01/05/23 1156)  cyclobenzaprine (FLEXERIL) tablet 10 mg (10 mg Oral Given 01/05/23 1154)    ED Course/ Medical Decision Making/ A&P                             Medical Decision Making Amount and/or Complexity of Data Reviewed Labs: ordered. Radiology: ordered.  Risk Prescription drug management. Decision regarding hospitalization.   VALERIO PINARD is here with low back pain after fall this morning.  He states that his blood sugar was running low this morning and prior to him being able to take some sugar tablets he fell and hit his low back.  Did not hit his head or lose consciousness.  Is not having any head or neck pain.  Blood sugars improved after some sugar tablets at home.  Does take insulin for diabetes.  Did not take any this morning.  He is having pain mostly in the low back.  Denies any weakness or numbness.  No other pain elsewhere.  No upper extremity pain.  X-rays obtained of the chest, pelvis, low back including basic labs.  Blood sugar normal upon arrival.  Neurovascular neuromuscular intact otherwise.  Patient did not want head CT or neck CT.  He is adamant he did not hit his head.  Patient per radiology report has acute compression fracture at L1 with 60% loss of height.  Otherwise images are unremarkable per my review and interpretation.  Blood work unremarkable per my review and interpretation.  Blood sugars been stable.  He is still in a fair amount of pain despite some oral and IV pain medications and muscle relaxants.   Patient still feeling really uncomfortable.  Does not feel like he will be able to safely go home.  Does not think his wife will be able to help him around the house.  He  still looks pretty uncomfortable on exam.  I will admit to the hospitalist.  I have ordered a TLSO brace.  Will need further pain management, may be PT  OT, additional assistive devices at home.  This chart was dictated using voice recognition software.  Despite best efforts to proofread,  errors can occur which can change the documentation meaning.         Final Clinical Impression(s) / ED Diagnoses Final diagnoses:  Compression fracture of L1 vertebra, initial encounter [facility])    Rx / DC Orders ED Discharge Orders     None         Virgina Norfolk, DO 01/05/23 1246

## 2023-01-05 NOTE — ED Notes (Signed)
Pt states he is in a lot of pain. Pt states his pain is worse pain ever 10/10

## 2023-01-05 NOTE — ED Triage Notes (Signed)
PT BIB [facility], reports fall when attempting to get "glucose tabs. Pt endorses thinners. PT c/o lower back pain, hit back on fireplace hearth, denies loc, denies head trauma

## 2023-01-05 NOTE — ED Notes (Signed)
Report given to [facility] RN, CL, ETA 15 minutes.

## 2023-01-05 NOTE — ED Notes (Addendum)
Spoke with [employer] answering service for tech to fit for [employer]. Will page it out

## 2023-01-06 ENCOUNTER — Telehealth: Payer: Self-pay | Admitting: Cardiovascular Disease

## 2023-01-06 ENCOUNTER — Inpatient Hospital Stay (HOSPITAL_COMMUNITY): Payer: Managed Care, Other (non HMO)

## 2023-01-06 DIAGNOSIS — S32000A Wedge compression fracture of unspecified lumbar vertebra, initial encounter for closed fracture: Secondary | ICD-10-CM | POA: Diagnosis not present

## 2023-01-06 LAB — URINALYSIS, ROUTINE W REFLEX MICROSCOPIC
Bacteria, UA: NONE SEEN
Bilirubin Urine: NEGATIVE
Glucose, UA: >=500 mg/dL — AB
Hgb urine dipstick: NEGATIVE
Ketones, ur: 80 mg/dL — AB
Leukocytes,Ua: NEGATIVE
Nitrite: NEGATIVE
Protein, ur: NEGATIVE mg/dL
Specific Gravity, Urine: 1.021 (ref 1.005–1.030)
pH: 5 (ref 5.0–8.0)

## 2023-01-06 LAB — BASIC METABOLIC PANEL
Anion gap: 22 — ABNORMAL HIGH (ref 5–15)
BUN: 25 mg/dL — ABNORMAL HIGH (ref 8–23)
CO2: 17 mmol/L — ABNORMAL LOW (ref 22–32)
Calcium: 9 mg/dL (ref 8.9–10.3)
Chloride: 94 mmol/L — ABNORMAL LOW (ref 98–111)
Creatinine, Ser: 1.5 mg/dL — ABNORMAL HIGH (ref 0.61–1.24)
GFR, Estimated: 51 mL/min — ABNORMAL LOW (ref >60)
Glucose, Bld: 274 mg/dL — ABNORMAL HIGH (ref 70–99)
Potassium: 5.5 mmol/L — ABNORMAL HIGH (ref 3.5–5.1)
Sodium: 133 mmol/L — ABNORMAL LOW (ref 135–145)

## 2023-01-06 LAB — CORTISOL: Cortisol, Plasma: 21.5 ug/dL

## 2023-01-06 LAB — GLUCOSE, CAPILLARY
Glucose-Capillary: 418 mg/dL — ABNORMAL HIGH (ref 70–99)
Glucose-Capillary: 287 mg/dL — ABNORMAL HIGH (ref 70–99)
Glucose-Capillary: 223 mg/dL — ABNORMAL HIGH (ref 70–99)
Glucose-Capillary: 207 mg/dL — ABNORMAL HIGH (ref 70–99)

## 2023-01-06 LAB — CK
Total CK: 246 U/L (ref 49–397)
Total CK: 214 U/L (ref 49–397)

## 2023-01-06 LAB — CBC
HCT: 36.3 % — ABNORMAL LOW (ref 39.0–52.0)
Hemoglobin: 12.5 g/dL — ABNORMAL LOW (ref 13.0–17.0)
MCH: 33.6 pg (ref 26.0–34.0)
MCHC: 34.4 g/dL (ref 30.0–36.0)
MCV: 97.6 fL (ref 80.0–100.0)
Platelets: 118 10*3/uL — ABNORMAL LOW (ref 150–400)
RBC: 3.72 MIL/uL — ABNORMAL LOW (ref 4.22–5.81)
RDW: 12.5 % (ref 11.5–15.5)
WBC: 8.5 10*3/uL (ref 4.0–10.5)
nRBC: 0 % (ref 0.0–0.2)

## 2023-01-06 LAB — RENAL FUNCTION PANEL
Albumin: 3.9 g/dL (ref 3.5–5.0)
Anion gap: 21 — ABNORMAL HIGH (ref 5–15)
BUN: 38 mg/dL — ABNORMAL HIGH (ref 8–23)
CO2: 17 mmol/L — ABNORMAL LOW (ref 22–32)
Calcium: 8.7 mg/dL — ABNORMAL LOW (ref 8.9–10.3)
Chloride: 90 mmol/L — ABNORMAL LOW (ref 98–111)
Creatinine, Ser: 1.97 mg/dL — ABNORMAL HIGH (ref 0.61–1.24)
GFR, Estimated: 37 mL/min — ABNORMAL LOW (ref >60)
Glucose, Bld: 307 mg/dL — ABNORMAL HIGH (ref 70–99)
Phosphorus: 4.9 mg/dL — ABNORMAL HIGH (ref 2.5–4.6)
Potassium: 4.5 mmol/L (ref 3.5–5.1)
Sodium: 128 mmol/L — ABNORMAL LOW (ref 135–145)

## 2023-01-06 LAB — HEMOGLOBIN A1C
Hgb A1c MFr Bld: 6.9 % — ABNORMAL HIGH (ref 4.8–5.6)
Mean Plasma Glucose: 151.33 mg/dL

## 2023-01-06 LAB — SODIUM, URINE, RANDOM: Sodium, Ur: 38 mmol/L

## 2023-01-06 LAB — TSH: TSH: 1.192 u[IU]/mL (ref 0.350–4.500)

## 2023-01-06 LAB — HIV ANTIBODY (ROUTINE TESTING W REFLEX): HIV Screen 4th Generation wRfx: NONREACTIVE

## 2023-01-06 MED ORDER — ALUM & MAG HYDROXIDE-SIMETH 200-200-20 MG/5ML PO SUSP
30.0000 mL | Freq: Four times a day (QID) | ORAL | Status: DC | PRN
  Administered 2023-01-06: 30 mL via ORAL
  Filled 2023-01-06: qty 30

## 2023-01-06 MED ORDER — GLUCERNA SHAKE PO LIQD
237.0000 mL | Freq: Three times a day (TID) | ORAL | Status: DC
  Administered 2023-01-06 – 2023-01-14 (×16): 237 mL via ORAL

## 2023-01-06 MED ORDER — STERILE WATER FOR INJECTION IV SOLN
INTRAVENOUS | Status: DC
  Filled 2023-01-06 (×2): qty 1000

## 2023-01-06 MED ORDER — INSULIN ASPART PROT & ASPART (70-30 MIX) 100 UNIT/ML ~~LOC~~ SUSP
30.0000 [IU] | Freq: Two times a day (BID) | SUBCUTANEOUS | Status: DC
  Administered 2023-01-06: 15 [IU] via SUBCUTANEOUS
  Administered 2023-01-06 – 2023-01-07 (×2): 30 [IU] via SUBCUTANEOUS
  Filled 2023-01-06: qty 10

## 2023-01-06 MED ORDER — CLOPIDOGREL BISULFATE 75 MG PO TABS: 75.0000 mg | ORAL_TABLET | Freq: Every day | ORAL | 2 refills | Status: DC

## 2023-01-06 NOTE — Progress Notes (Addendum)
PROGRESS NOTE    Shawn Daniels  NLG:[id number] DOB: [birth year] DOA: 01/05/2023 PCP: Ignatius Specking, MD  Outpatient Specialists:     Brief Narrative:  Patient is a 67 year old male with past medical history significant for diabetes mellitus, hypertension and PVD.  Patient was admitted with a fall was related to hypoglycemic episode.  Imaging studies revealed L1 compression fracture.  The plan is to proceed with MRI of the thoracic and lumbar regions without contrast.  The plan is to proceed with kyphoplasty.  Apparently, patient also has Peyronie's disease and has had penile implant.  MRI was canceled earlier today to ensure that the penile implant was compatible with MRI.  Patient continues to report back pain.   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 -Patient will proceed with MRI of the thoracic and lumbar spine once penile implant is deemed compatible with MRI. -Optimize pain control. -The goal remains for patient to proceed with kyphoplasty when possible. -Pain control with Dilaudid, Robaxin, and lidocaine patch pre-procedure   DM -A1c at last check was 7.0 -h blood sugar was greater than 400 earlier today. -Restart 70/30 insulin 30 units twice daily. -Accu-Cheks and SSI coverage. -Low threshold to consult the diabetes coordinator.   HTN -Continue Toprol XL, lisinopril   HLD -Continue Repatha, fenofibrate, rosuvastatin   PVD -Continue ASA and Plavix   ETOH dependence -CIWA  protocol -Folate, thiamine, and MVI   -Symptom-triggered BZD (ativan per CIWA protocol) only since the patient is able to communicate; is not showing current signs of delirium; and has no history of severe withdrawal. -TOC team consult for substance abuse counseling  Acute kidney injury: -Urinalysis -Urine sodium -Check CPK. -Repeat renal panel -Aggressively hydrate patient. -Renal ultrasound. -Bladder scanning. -Renal panel in the morning.  Hyponatremia: -Likely volume related. -Cannot rule out SIADH. -Check urine osmolality. -Check serum osmolality. -Low threshold to check TSH and  DVT prophylaxis: SCD Code Status: Full code Family Communication:  Disposition Plan: This will depend on hospital course   Consultants:  IR  Procedures:  None for now.  Antimicrobials:  None for now.   Subjective: Patient continues to report back pain. Blood sugar has been up-and-down.  Objective: Vitals:   01/06/23 0127 01/06/23 0613 01/06/23 0737 01/06/23 1624  BP: 129/71 (!) 130/56 (!) 138/57 99/61  Pulse: 95 93 (!) 101 98  Resp: 18 18 17 16   Temp: 98.5 F (36.9 C) 98.6 F (37 C) 98.2 F (36.8 C) 97.9 F (36.6 C)  TempSrc: Oral Oral Oral Oral  SpO2: 95% 95% 96% 90%  Weight:      Height:        Intake/Output Summary (Last 24 hours) at 01/06/2023 1739 Last data filed at 01/06/2023 0606 Gross per 24 hour  Intake --  Output 1700 ml  Net -1700 ml   Filed Weights   01/05/23 0953  Weight: 93.4 kg    Examination:  General exam: Appears calm and comfortable  Respiratory system: Clear to auscultation.  Cardiovascular system: S1 & S2 heard. Gastrointestinal system: Abdomen is nondistended, soft and nontender.  No organomegaly or masses felt. Normal bowel sounds heard. Central nervous system: Alert and oriented.  Patient moves all extremities. Extremities: No leg edema.   Data Reviewed: I have personally reviewed following labs and imaging studies  CBC: Recent Labs   Lab 01/05/23 1111 01/06/23 0256  WBC 9.7 8.5  NEUTROABS 8.5*  --   HGB 13.4 12.5*  HCT 37.7* 36.3*  MCV 92.9 97.6  PLT 130* 756*   Basic Metabolic Panel: Recent Labs  Lab 01/05/23 1111 01/06/23 0256 01/06/23 1121  NA 136 133* 128*  K 4.0 5.5* 4.5  CL 97* 94* 90*  CO2 23 17* 17*  GLUCOSE 128* 274* 307*  BUN 17 25* 38*  CREATININE 0.79 1.50* 1.97*  CALCIUM 9.0 9.0 8.7*  PHOS  --   --  4.9*   GFR: Estimated Creatinine Clearance: 42.2 mL/min (A) (by C-G formula based on SCr of 1.97 mg/dL (H)). Liver Function Tests: Recent Labs  Lab 01/06/23 1121  ALBUMIN 3.9   No results for input(s): "LIPASE", "AMYLASE" in the last 168 hours. No results for input(s): "AMMONIA" in the last 168 hours. Coagulation Profile: No results for input(s): "INR", "PROTIME" in the last 168 hours. Cardiac Enzymes: Recent Labs  Lab 01/06/23 1121  CKTOTAL 246   BNP (last 3 results) No results for input(s): "PROBNP" in the last 8760 hours. HbA1C: Recent Labs    01/06/23 0256  HGBA1C 6.9*   CBG: Recent Labs  Lab 01/05/23 1452 01/05/23 2049 01/06/23 0739 01/06/23 1139 01/06/23 1706  GLUCAP 141* 187* 418* 287* 223*   Lipid Profile: No results for input(s): "CHOL", "HDL", "LDLCALC", "TRIG", "CHOLHDL", "LDLDIRECT" in the last 72 hours. Thyroid Function Tests: No results for input(s): "TSH", "T4TOTAL", "FREET4", "T3FREE", "THYROIDAB" in the last 72 hours. Anemia Panel: No results for input(s): "VITAMINB12", "FOLATE", "FERRITIN", "TIBC", "IRON", "RETICCTPCT" in the last 72 hours. Urine analysis:    Component Value Date/Time   COLORURINE YELLOW 01/06/2023 1304   APPEARANCEUR CLEAR 01/06/2023 1304   LABSPEC 1.021 01/06/2023 1304   PHURINE 5.0 01/06/2023 1304   GLUCOSEU >=500 (A) 01/06/2023 1304   HGBUR NEGATIVE 01/06/2023 1304   BILIRUBINUR NEGATIVE 01/06/2023 1304   KETONESUR 80 (A) 01/06/2023 1304   PROTEINUR NEGATIVE 01/06/2023 1304   NITRITE NEGATIVE 01/06/2023 1304    LEUKOCYTESUR NEGATIVE 01/06/2023 1304   Sepsis Labs: @LABRCNTIP (procalcitonin:4,lacticidven:4)  )No results found for this or any previous visit (from the past 240 hour(s)).       Radiology Studies: DG Pelvis 1-2 Views  Result Date: 01/05/2023 CLINICAL DATA:  67 year old male status post fall with pain. EXAM: PELVIS - 1-2 VIEW COMPARISON:  CT Abdomen and Pelvis 06/01/2017. FINDINGS: AP view the pelvis 1050 hours. Extensive Aortoiliac calcified atherosclerosis. Femoral heads are normally located. Pelvis appears stable and intact. Grossly intact proximal femurs. Negative lower abdominal and pelvic visceral contours. A penile implant may be partially visible now. IMPRESSION: 1.  No acute fracture or dislocation identified about the pelvis. 2.  Aortic Atherosclerosis (ICD10-I70.0). Electronically Signed   By: Genevie Ann M.D.   On: 01/05/2023 11:10   DG Chest 2 View  Result Date: 01/05/2023 CLINICAL DATA:  67 year old male with fall and back pain. EXAM: CHEST - 2 VIEW COMPARISON:  Cardiac CT 07/27/2021. FINDINGS: AP and lateral views of the chest at 1056 hours. Lower lung volumes. Mediastinal contours remain normal. Visualized tracheal air column is within normal limits. No pneumothorax, pleural effusion, pulmonary edema or confluent lung opacity. Chronic right posterior 7th rib fracture was present  on the CT last year. No acute osseous abnormality identified. Paucity of bowel gas in the visible abdomen. IMPRESSION: No acute cardiopulmonary abnormality or acute traumatic injury identified. Electronically Signed   By: Genevie Ann M.D.   On: 01/05/2023 11:08   DG Lumbar Spine Complete  Result Date: 01/05/2023 CLINICAL DATA:  Back pain.  Fall. EXAM: LUMBAR SPINE - COMPLETE 4+ VIEW COMPARISON:  CT renal stone protocol 06/01/2017 FINDINGS: Anterior compression fracture is present at L1 with 60% loss of height. No definite retropulsed bone is present. Vertebral body heights are otherwise maintained. Alignment is  anatomic. Mild straightening of the normal cervical lordosis is present. Atherosclerotic calcifications are present within the aorta without evidence for aneurysm. IMPRESSION: 1. Anterior compression fracture at L1 with 60% loss of height. This was not present in 2018 and may be acute. 2. Aortic atherosclerosis. Electronically Signed   By: San Morelle M.D.   On: 01/05/2023 11:06        Scheduled Meds:  aspirin EC  81 mg Oral QHS   clopidogrel  75 mg Oral QHS   docusate sodium  100 mg Oral BID   fenofibrate  160 mg Oral Daily   folic acid  1 mg Oral Daily   insulin aspart  0-15 Units Subcutaneous TID WC   insulin aspart  0-5 Units Subcutaneous QHS   insulin aspart protamine- aspart  30 Units Subcutaneous BID WC   lidocaine  3 patch Transdermal Q24H   magnesium oxide  400 mg Oral BID   metoprolol succinate  12.5 mg Oral QHS   multivitamin with minerals  1 tablet Oral Daily   pantoprazole  40 mg Oral Daily   rosuvastatin  10 mg Oral QHS   thiamine  100 mg Oral Daily   Or   thiamine  100 mg Intravenous Daily   Continuous Infusions:  lactated ringers 75 mL/hr at 01/06/23 0606   methocarbamol (ROBAXIN) IV       LOS: 1 day    Time spent: 55 minutes.    Dana Allan, MD  [facility] Pager #: [phone] 7PM-7AM contact night coverage as above

## 2023-01-06 NOTE — Progress Notes (Signed)
Per patient urologist His penile implant was done 11/14.2018 [facility]

## 2023-01-06 NOTE — Progress Notes (Signed)
Inpatient Diabetes Program Recommendations  AACE/ADA: New Consensus Statement on Inpatient Glycemic Control (2015)  Target Ranges:  Prepandial:   less than 140 mg/dL      Peak postprandial:   less than 180 mg/dL (1-2 hours)      Critically ill patients:  140 - 180 mg/dL   Lab Results  Component Value Date   GLUCAP 287 (H) 01/06/2023   HGBA1C 6.9 (H) 01/06/2023    Review of Glycemic Control  Latest Reference Range & Units 01/05/23 20:49 01/06/23 07:39 01/06/23 11:39  Glucose-Capillary 70 - 99 mg/dL 187 (H) 418 (H) 287 (H)   Diabetes history: DM (states he has always been on insulin) Outpatient Diabetes medications:  Novolin 70/30- 30 units bid Glyxambe 25-5 q AM Current orders for Inpatient glycemic control:  Novolog 70/30 30 units bid Novolog moderate tid with meals and HS  Inpatient Diabetes Program Recommendations:    Spoke with patient at bedside.  He states that he is frustrated that his blood sugar was so high this morning and states that it was b/c he was not on his home dose of insulin.  Patient has had DM since 1998 and has always been on insulin.  He reports that he knows why his blood sugars dropped.  He has been under a tremendous amount of stress lately as well.  Asked if he had been offered a sensor for monitoring at home in the past.  He states he would like to have one if it is covered by insurance.  He is getting ready to switch over to Medicare.  Will follow up with patient on 01/07/23.    Thanks,  Adah Perl, RN, BC-ADM Inpatient Diabetes Coordinator Pager [phone]  (8a-5p)

## 2023-01-06 NOTE — Progress Notes (Signed)
  Transition of Care [facility]) Screening Note   Patient Details  Name: Shawn Daniels Date of Birth: Oct 23, 1956   Transition of Care [facility]) CM/SW Contact:    Bartholomew Crews, RN Phone Number: [phone] 01/06/2023, 12:43 PM    Transition of Care Department [facility]) has reviewed patient and no TOC needs have been identified at this time. We will continue to monitor patient advancement through interdisciplinary progression rounds. If new patient transition needs arise, please place a TOC consult.

## 2023-01-06 NOTE — Progress Notes (Signed)
Initial Nutrition Assessment  DOCUMENTATION CODES:   Not applicable  INTERVENTION:   Continue Multivitamin w/ minerals daily Glucerna Shake po TID, each supplement provides 220 kcal and 10 grams of protein Encourage good PO intake  NUTRITION DIAGNOSIS:   Increased nutrient needs related to acute illness as evidenced by estimated needs.  GOAL:   Patient will meet greater than or equal to 90% of their needs  MONITOR:   PO intake, Supplement acceptance, Labs, I & O's  REASON FOR ASSESSMENT:   Consult  (Nutritional Goals)  ASSESSMENT:   67 y.o. male presented to the ED after a fall, hitting his low back. PMH includes DM, HTN, HLD, and EtOH abuse. Pt admitted with lumbar compression fracture.  Pt laying in bed. Reports that his PO intake and appetite was good at home, ate well. Shares the since he has been in the hospital he as not been eating due to being on pain medication and often falling asleep. Also shared that his blood sugars have been up and down; states that typically when he has highs he is thirsty and hungry but today he hasn't really been hungry. Pt requesting something to have before is MRI, RD explained for some MRI pt require to be NPO. RD confirmed with RN, keeping NPO just in case for MRI, pt states that he understands.  RD to order nutrition supplement to assist with poor PO intake and increased needs for healing.    Medications reviewed and include: Colace, Folic acid, NovoLog SSI + 30 units BID, Magnesium Oxide, MVI, Protonix, Thiamine Labs reviewed: Sodium 133, Potassium 5.5, BUN 25, Creatinine 1.50, Hgb A1c 6.9%, 24 hr CBGs 141-418   NUTRITION - FOCUSED PHYSICAL EXAM:  Deferred to follow-up.   Diet Order:   Diet Order             Diet heart healthy/carb modified Room service appropriate? Yes; Fluid consistency: Thin  Diet effective now                   EDUCATION NEEDS:   No education needs have been identified at this time  Skin:  Skin  Assessment: Reviewed RN Assessment  Last BM:  Unknown  Height:   Ht Readings from Last 1 Encounters:  01/05/23 6\' 1"  (1.854 m)    Weight:   Wt Readings from Last 1 Encounters:  01/05/23 93.4 kg    Ideal Body Weight:  83.6 kg  BMI:  Body mass index is 27.18 kg/m.  Estimated Nutritional Needs:  Kcal:  2200-2400 Protein:  110-125 grams Fluid:  >/= 2 L    Hermina Barters RD, LDN Clinical Dietitian See [facility] for contact information.

## 2023-01-06 NOTE — Telephone Encounter (Signed)
Pt's medication was sent to pt's pharmacy as requested. Confirmation received.

## 2023-01-06 NOTE — Telephone Encounter (Signed)
*  STAT* If patient is at the pharmacy, call can be transferred to refill team.   1. Which medications need to be refilled? (please list name of each medication and dose if known) clopidogrel (PLAVIX) tablet 75 mg   2. Which pharmacy/location (including street and city if local pharmacy) is medication to be sent to? [place], [address]   3. Do they need a 30 day or 90 day supply? 90 day

## 2023-01-06 NOTE — Progress Notes (Signed)
Patient is refusing bladder scan, he said he doesn't need it. Patient educated.

## 2023-01-07 DIAGNOSIS — S32000A Wedge compression fracture of unspecified lumbar vertebra, initial encounter for closed fracture: Secondary | ICD-10-CM | POA: Diagnosis not present

## 2023-01-07 LAB — CBC WITH DIFFERENTIAL/PLATELET
Abs Immature Granulocytes: 0.04 10*3/uL (ref 0.00–0.07)
Basophils Absolute: 0 10*3/uL (ref 0.0–0.1)
Basophils Relative: 0 %
Eosinophils Absolute: 0 10*3/uL (ref 0.0–0.5)
Eosinophils Relative: 0 %
HCT: 36.4 % — ABNORMAL LOW (ref 39.0–52.0)
Hemoglobin: 12.9 g/dL — ABNORMAL LOW (ref 13.0–17.0)
Immature Granulocytes: 1 %
Lymphocytes Relative: 10 %
Lymphs Abs: 0.8 10*3/uL (ref 0.7–4.0)
MCH: 33.5 pg (ref 26.0–34.0)
MCHC: 35.4 g/dL (ref 30.0–36.0)
MCV: 94.5 fL (ref 80.0–100.0)
Monocytes Absolute: 0.7 10*3/uL (ref 0.1–1.0)
Monocytes Relative: 9 %
Neutro Abs: 6.5 10*3/uL (ref 1.7–7.7)
Neutrophils Relative %: 80 %
Platelets: 115 10*3/uL — ABNORMAL LOW (ref 150–400)
RBC: 3.85 MIL/uL — ABNORMAL LOW (ref 4.22–5.81)
RDW: 12.2 % (ref 11.5–15.5)
WBC: 8.1 10*3/uL (ref 4.0–10.5)
nRBC: 0 % (ref 0.0–0.2)

## 2023-01-07 LAB — RENAL FUNCTION PANEL
Albumin: 3.6 g/dL (ref 3.5–5.0)
Anion gap: 13 (ref 5–15)
BUN: 28 mg/dL — ABNORMAL HIGH (ref 8–23)
CO2: 29 mmol/L (ref 22–32)
Calcium: 8.9 mg/dL (ref 8.9–10.3)
Chloride: 91 mmol/L — ABNORMAL LOW (ref 98–111)
Creatinine, Ser: 1.06 mg/dL (ref 0.61–1.24)
GFR, Estimated: >60 mL/min (ref >60)
Glucose, Bld: 224 mg/dL — ABNORMAL HIGH (ref 70–99)
Phosphorus: 2.1 mg/dL — ABNORMAL LOW (ref 2.5–4.6)
Potassium: 4.3 mmol/L (ref 3.5–5.1)
Sodium: 133 mmol/L — ABNORMAL LOW (ref 135–145)

## 2023-01-07 LAB — MAGNESIUM: Magnesium: 2.3 mg/dL (ref 1.7–2.4)

## 2023-01-07 LAB — GLUCOSE, CAPILLARY
Glucose-Capillary: 187 mg/dL — ABNORMAL HIGH (ref 70–99)
Glucose-Capillary: 213 mg/dL — ABNORMAL HIGH (ref 70–99)
Glucose-Capillary: 81 mg/dL (ref 70–99)
Glucose-Capillary: 139 mg/dL — ABNORMAL HIGH (ref 70–99)

## 2023-01-07 LAB — OSMOLALITY: Osmolality: 302 mOsm/kg — ABNORMAL HIGH (ref 275–295)

## 2023-01-07 MED ORDER — OXYCODONE HCL 5 MG PO TABS: 5.0000 mg | ORAL_TABLET | ORAL | Status: DC | PRN

## 2023-01-07 NOTE — Progress Notes (Signed)
Patient refuses lab draw this AM, he said he wanted to talk to MD first.

## 2023-01-07 NOTE — Plan of Care (Signed)
  Problem: Education: Goal: Knowledge of General Education information will improve Description: Including pain rating scale, medication(s)/side effects and non-pharmacologic comfort measures Outcome: Progressing   Problem: Health Behavior/Discharge Planning: Goal: Ability to manage health-related needs will improve Outcome: Progressing   Problem: Clinical Measurements: Goal: Ability to maintain clinical measurements within normal limits will improve Outcome: Progressing Goal: Respiratory complications will improve Outcome: Progressing   Problem: Nutrition: Goal: Adequate nutrition will be maintained Outcome: Progressing   

## 2023-01-07 NOTE — Progress Notes (Signed)
Dilaudid pain med given to pt as requested. Also gave patient ice water and vanilla glucerna as requested. Patient stated that he didn't need anything else at this time. Will continue to monitor pt.

## 2023-01-07 NOTE — Progress Notes (Signed)
Dilaudid pain med given as requested. Urinal emptied. Pt currently has no other needs at this time.

## 2023-01-07 NOTE — Progress Notes (Addendum)
PROGRESS NOTE    Shawn Daniels  KZS:[id number] DOB: 11-12-1956 DOA: 01/05/2023 PCP: Glenda Chroman, MD    Brief Narrative:   Shawn Daniels is a 67 y.o. male with past medical history significant for type 2 diabetes mellitus, essential hypertension, peripheral vascular disease, history of Peyronie's disease s/p penile implant who presented to [facility] ED on 1/14 with complaints of low back pain following a fall at home.  Patient reports that his blood sugar was low and fell onto the fireplace hearth.  He reportedly had to crawl to the bathroom.  Imaging studies in the ED revealed L1 compression fracture.  TRH was consulted for admission for further evaluation management.  Assessment & Plan:    Lumbar compression fracture Patient presenting to ED with low back pain following fall at home.  Pelvis x-ray with no acute fracture or dislocation.  Lumbar spine x-ray with anterior compression fracture L1 with 60% height loss.  MR T/L-spine with compression fracture L1 with approximately 50% height loss and 3 mm retropulsion, no acute abnormality of the thoracic spine. -- IR consulted for kyphoplasty -- Oxycodone 5-10 mg p.o. every 4 hours as needed moderate/severe pain -- Dilaudid 1 mg IV every 2 hours as needed severe breakthrough pain -- Robaxin 5 mg IV every 6 hours as needed muscle spasms -- Holding Plavix, (need 5 day hold per IR)  Acute renal failure: Resolved Creatinine 0.7 on admission, trended up to 1.97 with correlating findings of bladder distention on imaging.  Likely secondary to bladder outlet obstruction from acute pain as above.  Supported with IV fluid hydration. --Cr 0.79>1.50>1.97>1.06 --Hold home lisinopril -- Continue LR at 72mL/h -- Continue monitor urine output  Type 2 diabetes mellitus Hemoglobin A1c 6.9 on 01/06/2023, well-controlled.  Patient with hypoglycemic episode prior to admission, likely secondary to strenuous exercise in the setting of continued insulin use  and poor oral intake leading to fall as above.  Home regimen includes NPH 70/3030 units twice daily and Glyxambi 25-5 mg PO daily -- NPH 30u [place] BID -- Moderate SSI for coverage -- CBG before every meal/at bedtime  Essential hypertension -- Metoprolol succinate 12.5 mg p.o. daily -- Holding home lisinopril for now secondary to AKI.  HLD: -- Crestor 10 mg p.o. daily -- continue Fenofibrate  -- Resume Repatha injections q. 14 days following discharge  GERD: Continue PPI  Peripheral vascular disease Follows with vascular surgery outpatient, Dr. Trula Slade.  Previously underwent right leg revascularization via femoral endarterectomy and SFA stenting.  Recently underwent left lower extremity aortogram with stent placement to left popliteal artery on 11/19/2022. --Continue aspirin, statin -- holding plavix as above   DVT prophylaxis: SCDs Start: 01/05/23 1731    Code Status: Full Code Family Communication: No family present at bedside this morning.  Disposition Plan:  Level of care: Med-Surg Status is: Inpatient Remains inpatient appropriate because: Awaiting IR for kyphoplasty, will need PT/OT evaluation following for disposition planning    Consultants:  Interventional radiology  Procedures:  None  Antimicrobials:  None   Subjective: Patient seen examined bedside, resting comfortably.  Lying in bed.  Upset that he has not received his procedure yet.  Continues with pain to his lower back, but now able to at least partially sit at edge of bed at times.  Denies any paresthesias, no issues with voiding.  No other specific questions or concerns at this time.  Denies headache, no dizziness, no chest pain, no palpitations, no shortness of breath, no abdominal pain, no  fever/chills/night sweats, no nausea/vomiting/diarrhea, no focal weakness, no fatigue, no paresthesias.  No acute events overnight per nurse staff.  Objective: Vitals:   01/06/23 2001 01/07/23 0335 01/07/23 0813  01/07/23 0822  BP: (!) 109/56 (!) 97/59 123/68   Pulse: 95 93 92 91  Resp: 17 18 18    Temp: 99.6 F (37.6 C) 98.8 F (37.1 C) 98.1 F (36.7 C)   TempSrc: Oral Oral Oral   SpO2: (!) 89% (!) 87% (!) 80% (!) 88%  Weight:      Height:        Intake/Output Summary (Last 24 hours) at 01/07/2023 1213 Last data filed at 01/07/2023 9528 Gross per 24 hour  Intake --  Output 1300 ml  Net -1300 ml   Filed Weights   01/05/23 0953  Weight: 93.4 kg    Examination:  Physical Exam: GEN: NAD, alert and oriented x 3, wd/wn HEENT: NCAT, PERRL, EOMI, sclera clear, MMM PULM: CTAB w/o wheezes/crackles, normal respiratory effort, on room air CV: RRR w/o M/G/R GI: abd soft, NTND, NABS, no R/G/M MSK: no peripheral edema, muscle strength globally intact 5/5 bilateral upper/lower extremities NEURO: CN II-XII intact, no focal deficits, sensation to light touch intact PSYCH: normal mood/affect Integumentary: dry/intact, no rashes or wounds    Data Reviewed: I have personally reviewed following labs and imaging studies  CBC: Recent Labs  Lab 01/05/23 1111 01/06/23 0256 01/07/23 0924  WBC 9.7 8.5 8.1  NEUTROABS 8.5*  --  6.5  HGB 13.4 12.5* 12.9*  HCT 37.7* 36.3* 36.4*  MCV 92.9 97.6 94.5  PLT 130* 118* 413*   Basic Metabolic Panel: Recent Labs  Lab 01/05/23 1111 01/06/23 0256 01/06/23 1121 01/07/23 0924  NA 136 133* 128* 133*  K 4.0 5.5* 4.5 4.3  CL 97* 94* 90* 91*  CO2 23 17* 17* 29  GLUCOSE 128* 274* 307* 224*  BUN 17 25* 38* 28*  CREATININE 0.79 1.50* 1.97* 1.06  CALCIUM 9.0 9.0 8.7* 8.9  MG  --   --   --  2.3  PHOS  --   --  4.9* 2.1*   GFR: Estimated Creatinine Clearance: 78.5 mL/min (by C-G formula based on SCr of 1.06 mg/dL). Liver Function Tests: Recent Labs  Lab 01/06/23 1121 01/07/23 0924  ALBUMIN 3.9 3.6   No results for input(s): "LIPASE", "AMYLASE" in the last 168 hours. No results for input(s): "AMMONIA" in the last 168 hours. Coagulation Profile: No  results for input(s): "INR", "PROTIME" in the last 168 hours. Cardiac Enzymes: Recent Labs  Lab 01/06/23 1121 01/06/23 1904  CKTOTAL 246 214   BNP (last 3 results) No results for input(s): "PROBNP" in the last 8760 hours. HbA1C: Recent Labs    01/06/23 0256  HGBA1C 6.9*   CBG: Recent Labs  Lab 01/06/23 1139 01/06/23 1706 01/06/23 2000 01/07/23 0816 01/07/23 1153  GLUCAP 287* 223* 207* 187* 213*   Lipid Profile: No results for input(s): "CHOL", "HDL", "LDLCALC", "TRIG", "CHOLHDL", "LDLDIRECT" in the last 72 hours. Thyroid Function Tests: Recent Labs    01/06/23 1904  TSH 1.192   Anemia Panel: No results for input(s): "VITAMINB12", "FOLATE", "FERRITIN", "TIBC", "IRON", "RETICCTPCT" in the last 72 hours. Sepsis Labs: No results for input(s): "PROCALCITON", "LATICACIDVEN" in the last 168 hours.  No results found for this or any previous visit (from the past 240 hour(s)).       Radiology Studies: US RENAL  Result Date: 01/06/2023 CLINICAL DATA:  Acute kidney injury. EXAM: RENAL / URINARY TRACT ULTRASOUND  COMPLETE COMPARISON:  None Available. FINDINGS: Right Kidney: Renal measurements: 11.1 cm x 6.0 cm x 5.6 cm = volume: 193 mL. Echogenicity within normal limits. No mass or hydronephrosis visualized. Left Kidney: Renal measurements: 11.6 cm x 6.2 cm x 5.6 cm = volume: 211 mL. Echogenicity within normal limits. No mass or hydronephrosis visualized. Bladder: Appears normal for degree of bladder distention. Other: None. IMPRESSION: Normal renal ultrasound. Electronically Signed   By: Aram Candela M.D.   On: 01/06/2023 20:21   MR THORACIC SPINE WO CONTRAST  Result Date: 01/06/2023 CLINICAL DATA:  Compression fracture EXAM: MRI THORACIC AND LUMBAR SPINE WITHOUT CONTRAST TECHNIQUE: Multiplanar and multiecho pulse sequences of the thoracic and lumbar spine were obtained without intravenous contrast. COMPARISON:  None Available. FINDINGS: MRI THORACIC SPINE FINDINGS  Alignment:  Normal Vertebrae: Chronic compression deformity of T7. No acute osseous abnormality of the thoracic spine Cord:  Normal Paraspinal and other soft tissues: Bilateral dependent atelectasis. Disc levels: No spinal canal stenosis MRI LUMBAR SPINE FINDINGS Segmentation:  Standard. Alignment:  Physiologic. Vertebrae: Compression fracture of L1 with approximately 50% height loss and 3 mm of retropulsion. Moderate diffuse bone marrow edema, extending into both pedicles. Conus medullaris and cauda equina: Conus extends to the L1 level. Conus and cauda equina appear normal. Paraspinal and other soft tissues: Distended urinary bladder Disc levels: T11-12: Normal. T12-L1: Normal. L1-L2: L1 retropulsion mildly narrows the subarticular recess. No spinal canal stenosis. No neural foraminal stenosis. L2-L3: Normal disc space and facet joints. No spinal canal stenosis. No neural foraminal stenosis. L3-L4: Small central disc protrusion. No spinal canal stenosis. No neural foraminal stenosis. L4-L5: Small central disc protrusion. Narrowing of both lateral recesses without central spinal canal stenosis. No neural foraminal stenosis. L5-S1: Normal disc space and facet joints. No spinal canal stenosis. No neural foraminal stenosis. Visualized sacrum: Normal. IMPRESSION: 1. Recent compression fracture of L1 with approximately 50% height loss and 3 mm of retropulsion. 2. No acute abnormality of the thoracic spine. 3. Distended urinary bladder. Correlate for urinary retention. Electronically Signed   By: Deatra Robinson M.D.   On: 01/06/2023 19:57   MR LUMBAR SPINE WO CONTRAST  Result Date: 01/06/2023 CLINICAL DATA:  Compression fracture EXAM: MRI THORACIC AND LUMBAR SPINE WITHOUT CONTRAST TECHNIQUE: Multiplanar and multiecho pulse sequences of the thoracic and lumbar spine were obtained without intravenous contrast. COMPARISON:  None Available. FINDINGS: MRI THORACIC SPINE FINDINGS Alignment:  Normal Vertebrae: Chronic  compression deformity of T7. No acute osseous abnormality of the thoracic spine Cord:  Normal Paraspinal and other soft tissues: Bilateral dependent atelectasis. Disc levels: No spinal canal stenosis MRI LUMBAR SPINE FINDINGS Segmentation:  Standard. Alignment:  Physiologic. Vertebrae: Compression fracture of L1 with approximately 50% height loss and 3 mm of retropulsion. Moderate diffuse bone marrow edema, extending into both pedicles. Conus medullaris and cauda equina: Conus extends to the L1 level. Conus and cauda equina appear normal. Paraspinal and other soft tissues: Distended urinary bladder Disc levels: T11-12: Normal. T12-L1: Normal. L1-L2: L1 retropulsion mildly narrows the subarticular recess. No spinal canal stenosis. No neural foraminal stenosis. L2-L3: Normal disc space and facet joints. No spinal canal stenosis. No neural foraminal stenosis. L3-L4: Small central disc protrusion. No spinal canal stenosis. No neural foraminal stenosis. L4-L5: Small central disc protrusion. Narrowing of both lateral recesses without central spinal canal stenosis. No neural foraminal stenosis. L5-S1: Normal disc space and facet joints. No spinal canal stenosis. No neural foraminal stenosis. Visualized sacrum: Normal. IMPRESSION: 1. Recent compression fracture of L1  with approximately 50% height loss and 3 mm of retropulsion. 2. No acute abnormality of the thoracic spine. 3. Distended urinary bladder. Correlate for urinary retention. Electronically Signed   By: Deatra Robinson M.D.   On: 01/06/2023 19:57        Scheduled Meds:  aspirin EC  81 mg Oral QHS   clopidogrel  75 mg Oral QHS   docusate sodium  100 mg Oral BID   feeding supplement (GLUCERNA SHAKE)  237 mL Oral TID BM   fenofibrate  160 mg Oral Daily   folic acid  1 mg Oral Daily   insulin aspart  0-15 Units Subcutaneous TID WC   insulin aspart  0-5 Units Subcutaneous QHS   insulin aspart protamine- aspart  30 Units Subcutaneous BID WC   lidocaine  3  patch Transdermal Q24H   magnesium oxide  400 mg Oral BID   metoprolol succinate  12.5 mg Oral QHS   multivitamin with minerals  1 tablet Oral Daily   pantoprazole  40 mg Oral Daily   rosuvastatin  10 mg Oral QHS   thiamine  100 mg Oral Daily   Or   thiamine  100 mg Intravenous Daily   Continuous Infusions:  lactated ringers 75 mL/hr at 01/06/23 0606   methocarbamol (ROBAXIN) IV     sodium bicarbonate 150 mEq in sterile water 1,150 mL infusion 100 mL/hr at 01/06/23 2055     LOS: 2 days    Time spent: 50 minutes spent on chart review, discussion with nursing staff, consultants, updating family and interview/physical exam; more than 50% of that time was spent in counseling and/or coordination of care.    Alvira Philips [place], DO [facility] Available via Epic secure chat 7am-7pm After these hours, please refer to coverage provider listed on [email or web address] 01/07/2023, 12:13 PM

## 2023-01-08 ENCOUNTER — Other Ambulatory Visit (HOSPITAL_COMMUNITY): Payer: Self-pay

## 2023-01-08 ENCOUNTER — Encounter (HOSPITAL_COMMUNITY): Payer: Self-pay | Admitting: Internal Medicine

## 2023-01-08 DIAGNOSIS — S32000A Wedge compression fracture of unspecified lumbar vertebra, initial encounter for closed fracture: Secondary | ICD-10-CM | POA: Diagnosis not present

## 2023-01-08 LAB — GLUCOSE, CAPILLARY
Glucose-Capillary: 260 mg/dL — ABNORMAL HIGH (ref 70–99)
Glucose-Capillary: 163 mg/dL — ABNORMAL HIGH (ref 70–99)
Glucose-Capillary: 202 mg/dL — ABNORMAL HIGH (ref 70–99)
Glucose-Capillary: 228 mg/dL — ABNORMAL HIGH (ref 70–99)

## 2023-01-08 MED ORDER — HYDROMORPHONE HCL 2 MG PO TABS
1.0000 mg | ORAL_TABLET | ORAL | Status: DC | PRN
  Administered 2023-01-08: 1 mg via ORAL
  Filled 2023-01-08 (×2): qty 1

## 2023-01-08 MED ORDER — INSULIN ASPART PROT & ASPART (70-30 MIX) 100 UNIT/ML ~~LOC~~ SUSP
15.0000 [IU] | Freq: Two times a day (BID) | SUBCUTANEOUS | Status: DC
  Administered 2023-01-09 – 2023-01-14 (×9): 15 [IU] via SUBCUTANEOUS
  Filled 2023-01-08 (×3): qty 10

## 2023-01-08 NOTE — Progress Notes (Signed)
PROGRESS NOTE    Shawn Daniels  ZOX:[id number] DOB: 03/08/1956 DOA: 01/05/2023 PCP: Glenda Chroman, MD    Brief Narrative:   Shawn Daniels is a 67 y.o. male with past medical history significant for type 2 diabetes mellitus, essential hypertension, peripheral vascular disease, history of Peyronie's disease s/p penile implant who presented to [facility] ED on 1/14 with complaints of low back pain following a fall at home.  Patient reports that his blood sugar was low and fell onto the fireplace hearth.  He reportedly had to crawl to the bathroom.  Imaging studies in the ED revealed L1 compression fracture.  TRH was consulted for admission for further evaluation management.  Assessment & Plan:    Lumbar compression fracture Patient presenting to ED with low back pain following fall at home.  Pelvis x-ray with no acute fracture or dislocation.  Lumbar spine x-ray with anterior compression fracture L1 with 60% height loss.  MR T/L-spine with compression fracture L1 with approximately 50% height loss and 3 mm retropulsion, no acute abnormality of the thoracic spine. -- IR consulted for kyphoplasty; plan for monday -- Oxycodone 5-10 mg p.o. every 4 hours as needed moderate/severe pain -- Dilaudid 1 mg IV every 2 hours as needed severe breakthrough pain -- Robaxin 5 mg IV every 6 hours as needed muscle spasms -- Holding Plavix, (need 5 day hold per IR; last dose PM of 1/15)  Acute renal failure: Resolved Creatinine 0.7 on admission, trended up to 1.97 with correlating findings of bladder distention on imaging.  Likely secondary to bladder outlet obstruction from acute pain as above.  Supported with IV fluid hydration. --Cr 0.79>1.50>1.97>1.06 -- Hold home lisinopril -- Continue LR at 77mL/h -- Continue monitor urine output  Type 2 diabetes mellitus Hemoglobin A1c 6.9 on 01/06/2023, well-controlled.  Patient with hypoglycemic episode prior to admission, likely secondary to strenuous exercise  in the setting of continued insulin use and poor oral intake leading to fall as above.  Home regimen includes NPH 70/3030 units twice daily and Glyxambi 25-5 mg PO daily -- Diabetic educator following, appreciate assistance -- NPH 15u  BID -- Moderate SSI for coverage -- CBG before every meal/at bedtime  Essential hypertension -- Metoprolol succinate 12.5 mg p.o. daily -- Holding home lisinopril for now secondary to AKI.  HLD: -- Crestor 10 mg p.o. daily -- continue Fenofibrate  -- Resume Repatha injections q. 14 days following discharge  GERD: Continue PPI  Peripheral vascular disease Follows with vascular surgery outpatient, Dr. Trula Slade.  Previously underwent right leg revascularization via femoral endarterectomy and SFA stenting.  Recently underwent left lower extremity aortogram with stent placement to left popliteal artery on 11/19/2022. -- Continue aspirin, statin -- holding plavix as above   DVT prophylaxis: SCDs Start: 01/05/23 1731    Code Status: Full Code Family Communication: No family present at bedside this morning.  Disposition Plan:  Level of care: Med-Surg Status is: Inpatient Remains inpatient appropriate because: Awaiting IR for kyphoplasty planned for Monday 1/22, will need PT/OT evaluation following for disposition planning    Consultants:  Interventional radiology  Procedures:  None  Antimicrobials:  None   Subjective: Patient seen examined bedside, resting comfortably.  Lying in bed.  No specific complaints this morning.  Kyphoplasty planned for Monday 1/22, Plavix continues to be on hold.    No other questions or concerns at this time.  Denies headache, no dizziness, no chest pain, no palpitations, no shortness of breath, no abdominal pain, no fever/chills/night  sweats, no nausea/vomiting/diarrhea, no focal weakness, no fatigue, no paresthesias.  No acute events overnight per nurse staff.  Objective: Vitals:   01/07/23 2051 01/08/23 0445  01/08/23 0450 01/08/23 0732  BP:  (!) 149/78  (!) 152/66  Pulse:  75  98  Resp:  20  16  Temp:  98.5 F (36.9 C)  99.6 F (37.6 C)  TempSrc:  Oral  Oral  SpO2: 91% 97% 95% 93%  Weight:      Height:        Intake/Output Summary (Last 24 hours) at 01/08/2023 1456 Last data filed at 01/08/2023 0519 Gross per 24 hour  Intake 3344.92 ml  Output 2350 ml  Net 994.92 ml   Filed Weights   01/05/23 0953  Weight: 93.4 kg    Examination:  Physical Exam: GEN: NAD, alert and oriented x 3, wd/wn HEENT: NCAT, PERRL, EOMI, sclera clear, MMM PULM: CTAB w/o wheezes/crackles, normal respiratory effort, on room air CV: RRR w/o M/G/R GI: abd soft, NTND, NABS, no R/G/M MSK: no peripheral edema, muscle strength globally intact 5/5 bilateral upper/lower extremities NEURO: CN II-XII intact, no focal deficits, sensation to light touch intact PSYCH: normal mood/affect Integumentary: dry/intact, no rashes or wounds    Data Reviewed: I have personally reviewed following labs and imaging studies  CBC: Recent Labs  Lab 01/05/23 1111 01/06/23 0256 01/07/23 0924  WBC 9.7 8.5 8.1  NEUTROABS 8.5*  --  6.5  HGB 13.4 12.5* 12.9*  HCT 37.7* 36.3* 36.4*  MCV 92.9 97.6 94.5  PLT 130* 118* 115*   Basic Metabolic Panel: Recent Labs  Lab 01/05/23 1111 01/06/23 0256 01/06/23 1121 01/07/23 0924  NA 136 133* 128* 133*  K 4.0 5.5* 4.5 4.3  CL 97* 94* 90* 91*  CO2 23 17* 17* 29  GLUCOSE 128* 274* 307* 224*  BUN 17 25* 38* 28*  CREATININE 0.79 1.50* 1.97* 1.06  CALCIUM 9.0 9.0 8.7* 8.9  MG  --   --   --  2.3  PHOS  --   --  4.9* 2.1*   GFR: Estimated Creatinine Clearance: 78.5 mL/min (by C-G formula based on SCr of 1.06 mg/dL). Liver Function Tests: Recent Labs  Lab 01/06/23 1121 01/07/23 0924  ALBUMIN 3.9 3.6   No results for input(s): "LIPASE", "AMYLASE" in the last 168 hours. No results for input(s): "AMMONIA" in the last 168 hours. Coagulation Profile: No results for input(s):  "INR", "PROTIME" in the last 168 hours. Cardiac Enzymes: Recent Labs  Lab 01/06/23 1121 01/06/23 1904  CKTOTAL 246 214   BNP (last 3 results) No results for input(s): "PROBNP" in the last 8760 hours. HbA1C: Recent Labs    01/06/23 0256  HGBA1C 6.9*   CBG: Recent Labs  Lab 01/07/23 1153 01/07/23 1618 01/07/23 2033 01/08/23 0829 01/08/23 1136  GLUCAP 213* 81 139* 260* 163*   Lipid Profile: No results for input(s): "CHOL", "HDL", "LDLCALC", "TRIG", "CHOLHDL", "LDLDIRECT" in the last 72 hours. Thyroid Function Tests: Recent Labs    01/06/23 1904  TSH 1.192   Anemia Panel: No results for input(s): "VITAMINB12", "FOLATE", "FERRITIN", "TIBC", "IRON", "RETICCTPCT" in the last 72 hours. Sepsis Labs: No results for input(s): "PROCALCITON", "LATICACIDVEN" in the last 168 hours.  No results found for this or any previous visit (from the past 240 hour(s)).       Radiology Studies: US RENAL  Result Date: 01/06/2023 CLINICAL DATA:  Acute kidney injury. EXAM: RENAL / URINARY TRACT ULTRASOUND COMPLETE COMPARISON:  None Available. FINDINGS: Right  Kidney: Renal measurements: 11.1 cm x 6.0 cm x 5.6 cm = volume: 193 mL. Echogenicity within normal limits. No mass or hydronephrosis visualized. Left Kidney: Renal measurements: 11.6 cm x 6.2 cm x 5.6 cm = volume: 211 mL. Echogenicity within normal limits. No mass or hydronephrosis visualized. Bladder: Appears normal for degree of bladder distention. Other: None. IMPRESSION: Normal renal ultrasound. Electronically Signed   By: Virgina Norfolk M.D.   On: 01/06/2023 20:21   MR THORACIC SPINE WO CONTRAST  Result Date: 01/06/2023 CLINICAL DATA:  Compression fracture EXAM: MRI THORACIC AND LUMBAR SPINE WITHOUT CONTRAST TECHNIQUE: Multiplanar and multiecho pulse sequences of the thoracic and lumbar spine were obtained without intravenous contrast. COMPARISON:  None Available. FINDINGS: MRI THORACIC SPINE FINDINGS Alignment:  Normal Vertebrae:  Chronic compression deformity of T7. No acute osseous abnormality of the thoracic spine Cord:  Normal Paraspinal and other soft tissues: Bilateral dependent atelectasis. Disc levels: No spinal canal stenosis MRI LUMBAR SPINE FINDINGS Segmentation:  Standard. Alignment:  Physiologic. Vertebrae: Compression fracture of L1 with approximately 50% height loss and 3 mm of retropulsion. Moderate diffuse bone marrow edema, extending into both pedicles. Conus medullaris and cauda equina: Conus extends to the L1 level. Conus and cauda equina appear normal. Paraspinal and other soft tissues: Distended urinary bladder Disc levels: T11-12: Normal. T12-L1: Normal. L1-L2: L1 retropulsion mildly narrows the subarticular recess. No spinal canal stenosis. No neural foraminal stenosis. L2-L3: Normal disc space and facet joints. No spinal canal stenosis. No neural foraminal stenosis. L3-L4: Small central disc protrusion. No spinal canal stenosis. No neural foraminal stenosis. L4-L5: Small central disc protrusion. Narrowing of both lateral recesses without central spinal canal stenosis. No neural foraminal stenosis. L5-S1: Normal disc space and facet joints. No spinal canal stenosis. No neural foraminal stenosis. Visualized sacrum: Normal. IMPRESSION: 1. Recent compression fracture of L1 with approximately 50% height loss and 3 mm of retropulsion. 2. No acute abnormality of the thoracic spine. 3. Distended urinary bladder. Correlate for urinary retention. Electronically Signed   By: Ulyses Jarred M.D.   On: 01/06/2023 19:57   MR LUMBAR SPINE WO CONTRAST  Result Date: 01/06/2023 CLINICAL DATA:  Compression fracture EXAM: MRI THORACIC AND LUMBAR SPINE WITHOUT CONTRAST TECHNIQUE: Multiplanar and multiecho pulse sequences of the thoracic and lumbar spine were obtained without intravenous contrast. COMPARISON:  None Available. FINDINGS: MRI THORACIC SPINE FINDINGS Alignment:  Normal Vertebrae: Chronic compression deformity of T7. No  acute osseous abnormality of the thoracic spine Cord:  Normal Paraspinal and other soft tissues: Bilateral dependent atelectasis. Disc levels: No spinal canal stenosis MRI LUMBAR SPINE FINDINGS Segmentation:  Standard. Alignment:  Physiologic. Vertebrae: Compression fracture of L1 with approximately 50% height loss and 3 mm of retropulsion. Moderate diffuse bone marrow edema, extending into both pedicles. Conus medullaris and cauda equina: Conus extends to the L1 level. Conus and cauda equina appear normal. Paraspinal and other soft tissues: Distended urinary bladder Disc levels: T11-12: Normal. T12-L1: Normal. L1-L2: L1 retropulsion mildly narrows the subarticular recess. No spinal canal stenosis. No neural foraminal stenosis. L2-L3: Normal disc space and facet joints. No spinal canal stenosis. No neural foraminal stenosis. L3-L4: Small central disc protrusion. No spinal canal stenosis. No neural foraminal stenosis. L4-L5: Small central disc protrusion. Narrowing of both lateral recesses without central spinal canal stenosis. No neural foraminal stenosis. L5-S1: Normal disc space and facet joints. No spinal canal stenosis. No neural foraminal stenosis. Visualized sacrum: Normal. IMPRESSION: 1. Recent compression fracture of L1 with approximately 50% height loss and 3  mm of retropulsion. 2. No acute abnormality of the thoracic spine. 3. Distended urinary bladder. Correlate for urinary retention. Electronically Signed   By: Deatra Robinson M.D.   On: 01/06/2023 19:57        Scheduled Meds:  aspirin EC  81 mg Oral QHS   docusate sodium  100 mg Oral BID   feeding supplement (GLUCERNA SHAKE)  237 mL Oral TID BM   fenofibrate  160 mg Oral Daily   folic acid  1 mg Oral Daily   insulin aspart  0-15 Units Subcutaneous TID WC   insulin aspart  0-5 Units Subcutaneous QHS   insulin aspart protamine- aspart  15 Units Subcutaneous BID WC   lidocaine  3 patch Transdermal Q24H   magnesium oxide  400 mg Oral BID    metoprolol succinate  12.5 mg Oral QHS   multivitamin with minerals  1 tablet Oral Daily   pantoprazole  40 mg Oral Daily   rosuvastatin  10 mg Oral QHS   thiamine  100 mg Oral Daily   Or   thiamine  100 mg Intravenous Daily   Continuous Infusions:  lactated ringers 75 mL/hr at 01/07/23 2319   methocarbamol (ROBAXIN) IV       LOS: 3 days    Time spent: 50 minutes spent on chart review, discussion with nursing staff, consultants, updating family and interview/physical exam; more than 50% of that time was spent in counseling and/or coordination of care.    Alvira Philips [place], DO [facility] Available via Epic secure chat 7am-7pm After these hours, please refer to coverage provider listed on [email or web address] 01/08/2023, 2:56 PM

## 2023-01-08 NOTE — Progress Notes (Signed)
Inpatient Diabetes Program Recommendations  AACE/ADA: New Consensus Statement on Inpatient Glycemic Control (2015)  Target Ranges:  Prepandial:   less than 140 mg/dL      Peak postprandial:   less than 180 mg/dL (1-2 hours)      Critically ill patients:  140 - 180 mg/dL   Lab Results  Component Value Date   GLUCAP 260 (H) 01/08/2023   HGBA1C 6.9 (H) 01/06/2023    Review of Glycemic Control  Latest Reference Range & Units 01/07/23 16:18 01/07/23 20:33 01/08/23 08:29  Glucose-Capillary 70 - 99 mg/dL 81 139 (H) 260 (H)   Diabetes history: DM 1 Outpatient Diabetes medications:  70/30 30 units bid Current orders for Inpatient glycemic control:  Novolog moderate tid with meals and HS  Inpatient Diabetes Program Recommendations:    Spoke with patient at bedside about insulin dosing.  He did not take his 70/30 this morning but did receive Novolog 8 units this AM.  He states that appetite is improving.  He is frustrated because the hospital does not check his blood sugars or give insulin the way he is accustomed too.  He has tight control of blood sugars at home and monitors frequently.  He also adjusts insulin doses based on his blood sugars.  Discussed with MD and patient.  Offered patient either Semglee/Novolog regimen or 70/30 regimen.  He prefers to take this insulin that he is accustomed too, but agrees that dose may need reduction. We discussed dose of 70/30 15 units bid and patient and MD agree.  Patient also may benefit from CGM due to hx. Of hypoglycemia and the frequency of blood sugar monitoring he does.  I will ask for benefits check to see which sensor is preferred.  Discussed with RN and will be glad to assist in any way.   Thanks Adah Perl, RN, BC-ADM Inpatient Diabetes Coordinator Pager [phone]  (8a-5p)

## 2023-01-08 NOTE — Progress Notes (Signed)
Messaged pharmacy a couple times. Still waiting for 11am insulin aspart protamine- aspart (NOVOLOG MIX 70/30) 15 unit injection to be tubed to unit

## 2023-01-08 NOTE — Consult Note (Addendum)
Chief Complaint: Patient was seen in consultation today for L1 KP Chief Complaint  Patient presents with   Fall   at the request of Jonah Blue   Referring Physician(s): Jonah Blue   Supervising Physician: Julieanne Cotton  Patient Status: [facility] - In-pt  History of Present Illness: Shawn Daniels is a 67 y.o. male with PMHs of type 2 diabetes mellitus, essential hypertension, peripheral vascular disease, history of Peyronie's disease s/p penile implant, acute L1 compression fx and intractable back pain who presents for L1 KP.   Patient presented to [facility] on 1/14 with complaints of low back pain following a fall at home, MR T and L spine showed recent compression fracture of L1 with approximately 50% height loss and 3 mm of retropulsion. Patient was admitted for pain control, IR was requested for image guided L1 KP.   Case was reviewed and approved by Dr. Corliss Skains, insurance approval received on 01/08/23. Patient is on Plavix which require 5 day hold, last given on 01/06/23.   Patient laying in bed, not in acute distress.  He states that PO oxycodone makes him very nauseous and that is why he has been needing IV Dilaudid, he is planning on trying PO Dilaudid this afternoon. Reports that back pain is 8 out of 10 point scale when pain med wears off, has not been able to ambulate well due to the back pain.  Denise headache, fever, chills, shortness of breath, cough, chest pain, abdominal pain, nausea ,vomiting, and bleeding.   Past Medical History:  Diagnosis Date   Diabetes mellitus without complication (HCC)    GERD (gastroesophageal reflux disease)    Hypertension    Peripheral vascular disease (HCC)     Past Surgical History:  Procedure Laterality Date   ABDOMINAL AORTOGRAM W/LOWER EXTREMITY Right 08/20/2022   Procedure: ABDOMINAL AORTOGRAM W/LOWER EXTREMITY;  Surgeon: Nada Libman, MD;  Location: [facility];  Service: Cardiovascular;  Laterality:  Right;   ABDOMINAL AORTOGRAM W/LOWER EXTREMITY N/A 11/19/2022   Procedure: ABDOMINAL AORTOGRAM W/LOWER EXTREMITY;  Surgeon: Nada Libman, MD;  Location: [facility];  Service: Cardiovascular;  Laterality: N/A;   ENDARTERECTOMY FEMORAL Right 04/26/2022   Procedure: RIGHT FEMORAL ENDARTERECTOMY;  Surgeon: Nada Libman, MD;  Location: [facility] OR;  Service: Vascular;  Laterality: Right;   INTRAVASCULAR LITHOTRIPSY Left 11/19/2022   Procedure: INTRAVASCULAR LITHOTRIPSY;  Surgeon: Nada Libman, MD;  Location: [facility];  Service: Cardiovascular;  Laterality: Left;   KNEE SURGERY     LOWER EXTREMITY ANGIOGRAPHY N/A 01/01/2022   Procedure: LOWER EXTREMITY ANGIOGRAPHY;  Surgeon: Elder Negus, MD;  Location: [facility];  Service: Cardiovascular;  Laterality: N/A;   PATCH ANGIOPLASTY Right 04/26/2022   Procedure: PATCH ANGIOPLASTY OF RIGHT FEMORAL ARTERY USING XENOSURE BOVINE PERCARDIUM PATCH;  Surgeon: Nada Libman, MD;  Location: [facility] OR;  Service: Vascular;  Laterality: Right;   PENILE PROSTHESIS IMPLANT     PERIPHERAL VASCULAR ATHERECTOMY  08/20/2022   Procedure: PERIPHERAL VASCULAR ATHERECTOMY;  Surgeon: Nada Libman, MD;  Location: [facility];  Service: Cardiovascular;;  popliteal   PERIPHERAL VASCULAR BALLOON ANGIOPLASTY  01/08/2022   Procedure: PERIPHERAL VASCULAR BALLOON ANGIOPLASTY;  Surgeon: Yates Decamp, MD;  Location: [facility];  Service: Cardiovascular;;   PERIPHERAL VASCULAR INTERVENTION Left 11/19/2022   Procedure: PERIPHERAL VASCULAR INTERVENTION;  Surgeon: Nada Libman, MD;  Location: [facility];  Service: Cardiovascular;  Laterality: Left;   WRIST  SURGERY      Allergies: Codeine, Keflex [cephalexin], and Penicillins  Medications: Prior to Admission medications   Medication Sig Start Date End Date Taking? Authorizing Provider  aspirin EC 81 MG tablet Take 81 mg by mouth at bedtime.   Yes [provider]   Evolocumab (REPATHA SURECLICK) 140 MG/ML SOAJ Inject 1 pen. into the skin every 14 (fourteen) days. 04/30/22  Yes Nahser, Deloris Ping, MD  fenofibrate (TRICOR) 145 MG tablet Take 1 tablet (145 mg total) by mouth daily. 06/20/22  Yes Nahser, Deloris Ping, MD  GLYXAMBI 25-5 MG TABS Take 1 tablet by mouth in the morning. 05/30/20  Yes [provider]  lisinopril (PRINIVIL,ZESTRIL) 10 MG tablet Take 10 mg by mouth in the morning and at bedtime.   Yes [provider]  Magnesium 400 MG CAPS Take 400 mg by mouth in the morning and at bedtime.   Yes [provider]  metoprolol succinate (TOPROL XL) 25 MG 24 hr tablet Take 1 tablet (25 mg total) by mouth daily. Patient taking differently: Take 12.5 mg by mouth at bedtime. 10/08/22  Yes Nahser, Deloris Ping, MD  Multiple Vitamins-Minerals (MULTIVITAMIN WITH MINERALS) tablet Take 1 tablet by mouth daily.   Yes [provider]  nitroGLYCERIN (NITRODUR - DOSED IN MG/24 HR) 0.2 mg/hr patch APPLY 1/4 (ONE-FOURTH) PATCH TO AFFECTED ELBOW, CHANGE DAILY Patient taking differently: Place 0.05-0.1 mg onto the skin daily as needed (for "tennis elbow"). 09/09/22  Yes Hudnall, Azucena Fallen, MD  NOVOLIN 70/30 RELION (70-30) 100 UNIT/ML injection Inject 30 Units into the skin See admin instructions. Inject 30 units into the skin 2 times a day with meals, per sliding scale 06/05/20  Yes [provider]  omeprazole (PRILOSEC) 20 MG capsule Take 20 mg by mouth daily before breakfast. 05/10/18  Yes [provider]  rosuvastatin (CRESTOR) 10 MG tablet Take 10 mg by mouth at bedtime.   Yes [provider]  ACCU-CHEK GUIDE test strip 1 each by Other route 2 (two) times daily. 04/25/20   [provider]  clopidogrel (PLAVIX) 75 MG tablet Take 1 tablet (75 mg total) by mouth daily. 01/06/23   Nahser, Deloris Ping, MD  RELION INSULIN SYRINGE 31G X 15/64" 0.5 ML MISC USE 1 THREE TIMES DAILY 05/04/20   [provider]     Family  History  Problem Relation Age of Onset   Heart failure Father     Social History   Socioeconomic History   Marital status: Married    Spouse name: Not on file   Number of children: 0   Years of education: Not on file   Highest education level: Not on file  Occupational History   Occupation: deliver truck parts  Tobacco Use   Smoking status: Former    Packs/day: 1.50    Years: 30.00    Total pack years: 45.00    Types: Cigarettes, Cigars    Quit date: 2009    Years since quitting: 15.0   Smokeless tobacco: Never  Vaping Use   Vaping Use: Never used  Substance and Sexual Activity   Alcohol use: Yes    Alcohol/week: 3.0 standard drinks of alcohol    Types: 3 Cans of beer per week    Comment: daily   Drug use: Never   Sexual activity: Not on file  Other Topics Concern   Not on file  Social History Narrative   Not on file   Social Determinants of Health   Financial Resource  Strain: Not on file  Food Insecurity: Not on file  Transportation Needs: Not on file  Physical Activity: Not on file  Stress: Not on file  Social Connections: Not on file     Review of Systems: A 12 point ROS discussed and pertinent positives are indicated in the HPI above.  All other systems are negative.  Vital Signs: BP (!) 152/66 (BP Location: Left Arm)   Pulse 98   Temp 99.6 F (37.6 C) (Oral)   Resp 16   Ht 6\' 1"  (1.854 m)   Wt 206 lb (93.4 kg)   SpO2 93%   BMI 27.18 kg/m    Physical Exam Vitals reviewed.  Constitutional:      General: He is not in acute distress.    Appearance: Normal appearance. He is not ill-appearing.  HENT:     Head: Normocephalic.     Mouth/Throat:     Mouth: Mucous membranes are moist.     Pharynx: Oropharynx is clear.  Cardiovascular:     Rate and Rhythm: Normal rate and regular rhythm.     Heart sounds: Normal heart sounds.  Pulmonary:     Effort: Pulmonary effort is normal.     Breath sounds: Normal breath sounds.  Abdominal:     General:  Abdomen is flat. Bowel sounds are normal.     Palpations: Abdomen is soft.  Musculoskeletal:        General: Tenderness present.     Cervical back: Neck supple.     Comments: TTP on lower back, right side slightly worse than left   Skin:    General: Skin is warm and dry.     Coloration: Skin is not jaundiced or pale.  Neurological:     Mental Status: He is alert and oriented to person, place, and time.  Psychiatric:        Mood and Affect: Mood normal.        Behavior: Behavior normal.        Judgment: Judgment normal.     MD Evaluation Airway: WNL Heart: WNL Abdomen: WNL Chest/ Lungs: WNL ASA  Classification: 3 Mallampati/Airway Score: One  Imaging: US RENAL  Result Date: 01/06/2023 CLINICAL DATA:  Acute kidney injury. EXAM: RENAL / URINARY TRACT ULTRASOUND COMPLETE COMPARISON:  None Available. FINDINGS: Right Kidney: Renal measurements: 11.1 cm x 6.0 cm x 5.6 cm = volume: 193 mL. Echogenicity within normal limits. No mass or hydronephrosis visualized. Left Kidney: Renal measurements: 11.6 cm x 6.2 cm x 5.6 cm = volume: 211 mL. Echogenicity within normal limits. No mass or hydronephrosis visualized. Bladder: Appears normal for degree of bladder distention. Other: None. IMPRESSION: Normal renal ultrasound. Electronically Signed   By: Aram Candelahaddeus  Houston M.D.   On: 01/06/2023 20:21   MR THORACIC SPINE WO CONTRAST  Result Date: 01/06/2023 CLINICAL DATA:  Compression fracture EXAM: MRI THORACIC AND LUMBAR SPINE WITHOUT CONTRAST TECHNIQUE: Multiplanar and multiecho pulse sequences of the thoracic and lumbar spine were obtained without intravenous contrast. COMPARISON:  None Available. FINDINGS: MRI THORACIC SPINE FINDINGS Alignment:  Normal Vertebrae: Chronic compression deformity of T7. No acute osseous abnormality of the thoracic spine Cord:  Normal Paraspinal and other soft tissues: Bilateral dependent atelectasis. Disc levels: No spinal canal stenosis MRI LUMBAR SPINE FINDINGS  Segmentation:  Standard. Alignment:  Physiologic. Vertebrae: Compression fracture of L1 with approximately 50% height loss and 3 mm of retropulsion. Moderate diffuse bone marrow edema, extending into both pedicles. Conus medullaris and cauda equina: Conus extends to the  L1 level. Conus and cauda equina appear normal. Paraspinal and other soft tissues: Distended urinary bladder Disc levels: T11-12: Normal. T12-L1: Normal. L1-L2: L1 retropulsion mildly narrows the subarticular recess. No spinal canal stenosis. No neural foraminal stenosis. L2-L3: Normal disc space and facet joints. No spinal canal stenosis. No neural foraminal stenosis. L3-L4: Small central disc protrusion. No spinal canal stenosis. No neural foraminal stenosis. L4-L5: Small central disc protrusion. Narrowing of both lateral recesses without central spinal canal stenosis. No neural foraminal stenosis. L5-S1: Normal disc space and facet joints. No spinal canal stenosis. No neural foraminal stenosis. Visualized sacrum: Normal. IMPRESSION: 1. Recent compression fracture of L1 with approximately 50% height loss and 3 mm of retropulsion. 2. No acute abnormality of the thoracic spine. 3. Distended urinary bladder. Correlate for urinary retention. Electronically Signed   By: Deatra Robinson M.D.   On: 01/06/2023 19:57   MR LUMBAR SPINE WO CONTRAST  Result Date: 01/06/2023 CLINICAL DATA:  Compression fracture EXAM: MRI THORACIC AND LUMBAR SPINE WITHOUT CONTRAST TECHNIQUE: Multiplanar and multiecho pulse sequences of the thoracic and lumbar spine were obtained without intravenous contrast. COMPARISON:  None Available. FINDINGS: MRI THORACIC SPINE FINDINGS Alignment:  Normal Vertebrae: Chronic compression deformity of T7. No acute osseous abnormality of the thoracic spine Cord:  Normal Paraspinal and other soft tissues: Bilateral dependent atelectasis. Disc levels: No spinal canal stenosis MRI LUMBAR SPINE FINDINGS Segmentation:  Standard. Alignment:   Physiologic. Vertebrae: Compression fracture of L1 with approximately 50% height loss and 3 mm of retropulsion. Moderate diffuse bone marrow edema, extending into both pedicles. Conus medullaris and cauda equina: Conus extends to the L1 level. Conus and cauda equina appear normal. Paraspinal and other soft tissues: Distended urinary bladder Disc levels: T11-12: Normal. T12-L1: Normal. L1-L2: L1 retropulsion mildly narrows the subarticular recess. No spinal canal stenosis. No neural foraminal stenosis. L2-L3: Normal disc space and facet joints. No spinal canal stenosis. No neural foraminal stenosis. L3-L4: Small central disc protrusion. No spinal canal stenosis. No neural foraminal stenosis. L4-L5: Small central disc protrusion. Narrowing of both lateral recesses without central spinal canal stenosis. No neural foraminal stenosis. L5-S1: Normal disc space and facet joints. No spinal canal stenosis. No neural foraminal stenosis. Visualized sacrum: Normal. IMPRESSION: 1. Recent compression fracture of L1 with approximately 50% height loss and 3 mm of retropulsion. 2. No acute abnormality of the thoracic spine. 3. Distended urinary bladder. Correlate for urinary retention. Electronically Signed   By: Deatra Robinson M.D.   On: 01/06/2023 19:57   DG Pelvis 1-2 Views  Result Date: 01/05/2023 CLINICAL DATA:  67 year old male status post fall with pain. EXAM: PELVIS - 1-2 VIEW COMPARISON:  CT Abdomen and Pelvis 06/01/2017. FINDINGS: AP view the pelvis 1050 hours. Extensive Aortoiliac calcified atherosclerosis. Femoral heads are normally located. Pelvis appears stable and intact. Grossly intact proximal femurs. Negative lower abdominal and pelvic visceral contours. A penile implant may be partially visible now. IMPRESSION: 1.  No acute fracture or dislocation identified about the pelvis. 2.  Aortic Atherosclerosis (ICD10-I70.0). Electronically Signed   By: Odessa Fleming M.D.   On: 01/05/2023 11:10   DG Chest 2 View  Result  Date: 01/05/2023 CLINICAL DATA:  67 year old male with fall and back pain. EXAM: CHEST - 2 VIEW COMPARISON:  Cardiac CT 07/27/2021. FINDINGS: AP and lateral views of the chest at 1056 hours. Lower lung volumes. Mediastinal contours remain normal. Visualized tracheal air column is within normal limits. No pneumothorax, pleural effusion, pulmonary edema or confluent lung opacity. Chronic right  posterior 7th rib fracture was present on the CT last year. No acute osseous abnormality identified. Paucity of bowel gas in the visible abdomen. IMPRESSION: No acute cardiopulmonary abnormality or acute traumatic injury identified. Electronically Signed   By: Genevie Ann M.D.   On: 01/05/2023 11:08   DG Lumbar Spine Complete  Result Date: 01/05/2023 CLINICAL DATA:  Back pain.  Fall. EXAM: LUMBAR SPINE - COMPLETE 4+ VIEW COMPARISON:  CT renal stone protocol 06/01/2017 FINDINGS: Anterior compression fracture is present at L1 with 60% loss of height. No definite retropulsed bone is present. Vertebral body heights are otherwise maintained. Alignment is anatomic. Mild straightening of the normal cervical lordosis is present. Atherosclerotic calcifications are present within the aorta without evidence for aneurysm. IMPRESSION: 1. Anterior compression fracture at L1 with 60% loss of height. This was not present in 2018 and may be acute. 2. Aortic atherosclerosis. Electronically Signed   By: San Morelle M.D.   On: 01/05/2023 11:06   VAS [place] LOWER EXTREMITY ARTERIAL DUPLEX  Result Date: 12/30/2022 LOWER EXTREMITY ARTERIAL DUPLEX STUDY Patient Name:  ADARIUS TIGGES  Date of Exam:   12/30/2022 Medical Rec #: [id number]           Accession #:    [id number] Date of Birth: May 22, 1956          Patient Gender: M Patient Age:   83 years Exam Location:  [facility] Procedure:      VAS [place] LOWER EXTREMITY ARTERIAL DUPLEX Referring Phys: Harold Barban  --------------------------------------------------------------------------------  Indications: Peripheral artery disease. High Risk Factors: Hypertension, hyperlipidemia, Diabetes, past history of                    smoking.  Vascular Interventions: 11/19/22 Left Popliteal stent                         08/20/22-Right popliteal stent                         04/26/22 RT FA endarterectomy by Dr Trula Slade. Current ABI:            R 1.09 L 0.94 Comparison Study: 10/21/22 Prior LT LE arterial duplex                   09/23/22 Prior ABI Performing Technologist: Elta Guadeloupe RVT, RDMS  Examination Guidelines: A complete evaluation includes B-mode imaging, spectral Doppler, color Doppler, and power Doppler as needed of all accessible portions of each vessel. Bilateral testing is considered an integral part of a complete examination. Limited examinations for reoccurring indications may be performed as noted.   +----------+--------+-----+---------------+---------+--------+ LEFT      PSV cm/sRatioStenosis       Waveform Comments +----------+--------+-----+---------------+---------+--------+ CFA [id number]                         triphasic         +----------+--------+-----+---------------+---------+--------+ DFA       138                         biphasic          +----------+--------+-----+---------------+---------+--------+ SFA Prox  236          50-74% stenosistriphasic         +----------+--------+-----+---------------+---------+--------+ SFA Mid   119  triphasic         +----------+--------+-----+---------------+---------+--------+ SFA [id number]                         triphasic         +----------+--------+-----+---------------+---------+--------+ POP Prox  101                         biphasic          +----------+--------+-----+---------------+---------+--------+ TP Trunk  74                          triphasic          +----------+--------+-----+---------------+---------+--------+  Left Stent(s): +---------------------+--------+--------+---------+--------+ Left Popliteal arteryPSV cm/sStenosisWaveform Comments +---------------------+--------+--------+---------+--------+ Prox to Stent        101             biphasic          +---------------------+--------+--------+---------+--------+ Proximal Stent       89              triphasic         +---------------------+--------+--------+---------+--------+ Mid Stent            102             triphasic         +---------------------+--------+--------+---------+--------+ Distal Stent         72              biphasic          +---------------------+--------+--------+---------+--------+ Distal to Stent      74              triphasic         +---------------------+--------+--------+---------+--------+    Summary: Left: Patent stent with no evidence of stenosis in the Left Popliteal artery.  See table(s) above for measurements and observations. Electronically signed by Coral ElseVance Brabham MD on 12/30/2022 at 6:49:59 PM.    Final    VAS US ABI WITH/WO TBI  Result Date: 12/30/2022  LOWER EXTREMITY DOPPLER STUDY Patient Name:  Alcide Evenerimothy A Buckman  Date of Exam:   12/30/2022 Medical Rec #: [id number]           Accession #:    [id number] Date of Birth: 29-May-1956          Patient Gender: M Patient Age:   [age] years [facility]am [facility] Procedure:      VAS US ABI WITH/WO TBI Referring Phys: Coral ElseVANCE BRABHAM --------------------------------------------------------------------------------  Indications: Peripheral artery disease. High Risk         Hypertension, hyperlipidemia, Diabetes, past history of Factors:          smoking.  Vascular Interventions: 11/19/22 Left Popliteal stent                         08/20/22-Right popliteal stent                         04/26/22 RT FA endarterectomy by Dr Myra GianottiBrabham. Comparison Study: Prior ABI 09/23/22                    10/21/22 Prior Left LE Arterial duplex. Performing Technologist: Lowell GuitarJohn Varner RVT, RDMS  Examination Guidelines: A complete evaluation includes at minimum, Doppler waveform signals and systolic blood pressure reading at the level of bilateral brachial, anterior tibial, and posterior tibial  arteries, when vessel segments are accessible. Bilateral testing is considered an integral part of a complete examination. Photoelectric Plethysmograph (PPG) waveforms and toe systolic pressure readings are included as required and additional duplex testing as needed. Limited examinations for reoccurring indications may be performed as noted.  ABI Findings: +---------+------------------+-----+-----------+--------+ Right    Rt Pressure (mmHg)IndexWaveform   Comment  +---------+------------------+-----+-----------+--------+ Brachial 126                                        +---------+------------------+-----+-----------+--------+ PTA      137               1.09 multiphasic         +---------+------------------+-----+-----------+--------+ DP       125               0.99 multiphasic         +---------+------------------+-----+-----------+--------+ Great Toe75                0.60 Normal              +---------+------------------+-----+-----------+--------+ +---------+------------------+-----+-----------+-------+ Left     Lt Pressure (mmHg)IndexWaveform   Comment +---------+------------------+-----+-----------+-------+ Brachial 109                                       +---------+------------------+-----+-----------+-------+ PTA      119               0.94 multiphasic        +---------+------------------+-----+-----------+-------+ DP       118               0.94 multiphasic        +---------+------------------+-----+-----------+-------+ Great Toe60                0.48 Normal             +---------+------------------+-----+-----------+-------+  +-------+-----------+-----------+------------+------------+ ABI/TBIToday's ABIToday's TBIPrevious ABIPrevious TBI +-------+-----------+-----------+------------+------------+ Right  1.09       0.60       1.06        0.62         +-------+-----------+-----------+------------+------------+ Left   0.94       0.48       0.74        0.57         +-------+-----------+-----------+------------+------------+  Arterial wall calcification precludes accurate ankle pressures and ABIs. Right ABIs and TBIs appear essentially unchanged. Left ABIs appear increased. Left TBI is decreased.  Summary: Right: Resting right ankle-brachial index is within normal range. The right toe-brachial index is abnormal. Left: Resting left ankle-brachial index indicates mild left lower extremity arterial disease. The left toe-brachial index is abnormal. Left ABI is borderline mild 0.94<0.95. *See table(s) above for measurements and observations.  Electronically signed by Coral ElseVance Brabham MD on 12/30/2022 at 6:45:10 PM.    Final     Labs:  CBC: Recent Labs    04/27/22 0505 08/20/22 [id number] 11/19/22 0555 01/05/23 1111 01/06/23 0256 01/07/23 0924  WBC 6.5  --   --  9.7 8.5 8.1  HGB 11.9*   < > 13.3 13.4 12.5* 12.9*  HCT 35.3*   < > 39.0 37.7* 36.3* 36.4*  PLT 108*  --   --  130* 118* 115*   < > = values in this interval not displayed.  COAGS: Recent Labs    04/23/22 1513  INR 1.0  APTT 27    BMP: Recent Labs    01/05/23 1111 01/06/23 0256 01/06/23 1121 01/07/23 0924  NA 136 133* 128* 133*  K 4.0 5.5* 4.5 4.3  CL 97* 94* 90* 91*  CO2 23 17* 17* 29  GLUCOSE 128* 274* 307* 224*  BUN 17 25* 38* 28*  CALCIUM 9.0 9.0 8.7* 8.9  CREATININE 0.79 1.50* 1.97* 1.06  GFRNONAA >60 51* 37* >60    LIVER FUNCTION TESTS: Recent Labs    04/23/22 1513 06/18/22 1010 01/06/23 1121 01/07/23 0924  BILITOT 0.9 0.6  --   --   AST 35 39  --   --   ALT 27 30  --   --   ALKPHOS 59 80  --   --   PROT 7.5 7.6  --    --   ALBUMIN 4.3 4.8 3.9 3.6    TUMOR MARKERS: No results for input(s): "AFPTM", "CEA", "CA199", "CHROMGRNA" in the last 8760 hours.  Assessment and Plan: 67 y.o. male with intractable back pain due to acute L1 KP after fall who is in need of L1 KP.  VSS On IV Dilaudid 1mg  q2h PRN, Robaxin 5 mg IV q6h PRN  CBC yesterday with WBC wnl, mild anemia and thrombocytopenia  RF yesterday BUN 28, creatinine 1.06, GFR> 60  No INR since May 2023, will update   Risks and benefits of L1 KP were discussed with the patient including, but not limited to education regarding the natural healing process of compression fractures without intervention, bleeding, infection, cement migration which may cause spinal cord damage, paralysis, pulmonary embolism or even death.  This interventional procedure involves the use of X-rays and because of the nature of the planned procedure, it is possible that we will have prolonged use of X-ray fluoroscopy.  Potential radiation risks to you include (but are not limited to) the following: - A slightly elevated risk for cancer  several years later in life. This risk is typically less than 0.5% percent. This risk is low in comparison to the normal incidence of human cancer, which is 33% for women and 50% for men according to the [place]. - Radiation induced injury can include skin redness, resembling a rash, tissue breakdown / ulcers and hair loss (which can be temporary or permanent).   The likelihood of either of these occurring depends on the difficulty of the procedure and whether you are sensitive to radiation due to previous procedures, disease, or genetic conditions.   IF your procedure requires a prolonged use of radiation, you will be notified and given written instructions for further action.  It is your responsibility to monitor the irradiated area for the 2 weeks following the procedure and to notify your physician if you are concerned that you  have suffered a radiation induced injury.    All of the patient's questions were answered, patient is agreeable to proceed.  Consent signed and in chart.  The procedure is tentatively scheduled for Monday pending NIR schedule.  If neurointervention request (cerebral angiogram/intervention) received over the weekend, the KP will be postponed till Tuesday.   PLAN  - NPO Mon MN - INR tomorrow AM  - CBC with diff Mon AM  - Continue to hold Plavix; can be bridged with heparin if needed, sq heparin needs 6 hr hold  - Allergic to keflex and penicillins, 1g Vanc to be given during the procedure, ordered under pre  procedure   Thank you for this interesting consult.  I greatly enjoyed meeting Shawn Daniels and look forward to participating in their care.  A copy of this report was sent to the requesting provider on this date.  Electronically Signed: Willette Brace, PA-C 01/08/2023, 2:00 PM   I spent a total of 20 Minutes    in face to face in clinical consultation, greater than 50% of which was counseling/coordinating care for L1 KP.  This chart was dictated using voice recognition software.  Despite best efforts to proofread,  errors can occur which can change the documentation meaning.

## 2023-01-08 NOTE — Progress Notes (Signed)
Per Rubie Maid, RN and Dr. [place] is aware- Are you okay with Shawn Daniels holding the morning dose of 70/30 and just giving it at 1700 with supper? blood sugar is 163- Thanks. Dr. [place] stated-yes

## 2023-01-08 NOTE — Progress Notes (Signed)
Urinal emptied and phone plugged into outlet per pt request.

## 2023-01-08 NOTE — Progress Notes (Signed)
New bag of LR hung

## 2023-01-08 NOTE — Progress Notes (Signed)
Insulin aspart protamine- aspart (NOVOLOG MIX 70/30) not given patient refused. Patient aslo educated on need and still stated he did not want the injection. [place],DO made aware of patient refusal. Chart on MAR as not given.

## 2023-01-08 NOTE — Progress Notes (Signed)
Dilaudid pain med given as requested. Next dose due @10 :25am. Patient made aware. Also wrote on patients white board. All needs met at this time. Will continue to monitor pt.

## 2023-01-08 NOTE — Progress Notes (Signed)
Dilaudid PRN pain med given to patient @11am . Next dose to be given at 1pm. Patient made aware. Time also written on white board in room

## 2023-01-08 NOTE — Plan of Care (Signed)

## 2023-01-09 ENCOUNTER — Ambulatory Visit: Payer: Managed Care, Other (non HMO) | Admitting: Nurse Practitioner

## 2023-01-09 DIAGNOSIS — S32000A Wedge compression fracture of unspecified lumbar vertebra, initial encounter for closed fracture: Secondary | ICD-10-CM | POA: Diagnosis not present

## 2023-01-09 LAB — BASIC METABOLIC PANEL
Anion gap: 17 — ABNORMAL HIGH (ref 5–15)
BUN: 12 mg/dL (ref 8–23)
CO2: 23 mmol/L (ref 22–32)
Calcium: 8.8 mg/dL — ABNORMAL LOW (ref 8.9–10.3)
Chloride: 90 mmol/L — ABNORMAL LOW (ref 98–111)
Creatinine, Ser: 1.07 mg/dL (ref 0.61–1.24)
GFR, Estimated: >60 mL/min (ref >60)
Glucose, Bld: 203 mg/dL — ABNORMAL HIGH (ref 70–99)
Potassium: 4.9 mmol/L (ref 3.5–5.1)
Sodium: 130 mmol/L — ABNORMAL LOW (ref 135–145)

## 2023-01-09 LAB — PROTIME-INR
INR: 1.1 (ref 0.8–1.2)
Prothrombin Time: 13.9 seconds (ref 11.4–15.2)

## 2023-01-09 LAB — GLUCOSE, CAPILLARY
Glucose-Capillary: 222 mg/dL — ABNORMAL HIGH (ref 70–99)
Glucose-Capillary: 264 mg/dL — ABNORMAL HIGH (ref 70–99)
Glucose-Capillary: 228 mg/dL — ABNORMAL HIGH (ref 70–99)

## 2023-01-09 NOTE — Progress Notes (Signed)
Inpatient Diabetes Program Recommendations  AACE/ADA: New Consensus Statement on Inpatient Glycemic Control (2015)  Target Ranges:  Prepandial:   less than 140 mg/dL      Peak postprandial:   less than 180 mg/dL (1-2 hours)      Critically ill patients:  140 - 180 mg/dL   Lab Results  Component Value Date   GLUCAP 264 (H) 01/09/2023   HGBA1C 6.9 (H) 01/06/2023    Review of Glycemic Control  Latest Reference Range & Units 01/09/23 08:24 01/09/23 12:27  Glucose-Capillary 70 - 99 mg/dL 222 (H) 264 (H)   Diabetes history: DM 1 Outpatient Diabetes medications:  70/30 30 units bid Current orders for Inpatient glycemic control:  Novolog moderate tid with meals and HS 70/30 15 units bid Inpatient Diabetes Program Recommendations:   Attempted to speak with patient regarding sensor and to let him know the Dexcom is preferred for his insurance. Patient was frustrated stating that he just wants to stand up because he is tired of laying in the bed.  Will have DM coord. F/u with patient on 01/10/23.  Reported to RN that patient is requesting to stand up.   Thanks,  Adah Perl, RN, BC-ADM Inpatient Diabetes Coordinator Pager [phone]  (8a-5p)

## 2023-01-09 NOTE — Evaluation (Addendum)
Physical Therapy Evaluation Patient Details Name: Shawn Daniels MRN: [id number] DOB: 30-May-1956 Today's Date: 01/09/2023  History of Present Illness  Pt is a 67 y/o male presenting with a fall.  MRI shows compression fx L1 with 50% loss of height.  Kyphoplasty scheduled for Monday.  PMHx:  DM, HTN, PVD.  Clinical Impression  Pt admitted with/for fall with back pain found to be L1 compression fx.  Pt needing min to min guard assist for basic mobility/gait..   Basic advised back care/prec instruction discussed.   Pt currently limited functionally due to the problems listed below.  (see problems list.)  Pt will benefit from PT to maximize function and safety to be able to get home safely with available assist.        Recommendations for follow up therapy are one component of a multi-disciplinary discharge planning process, led by the attending physician.  Recommendations may be updated based on patient status, additional functional criteria and insurance authorization.  Follow Up Recommendations No PT follow up (unless fails to progress post kyphoplasty as expected.)      Assistance Recommended at Discharge Set up Supervision/Assistance  Patient can return home with the following  A little help with walking and/or transfers;A little help with bathing/dressing/bathroom;Assistance with cooking/housework;Assist for transportation;Help with stairs or ramp for entrance    Equipment Recommendations Other (comment) (TBD)  Recommendations for Other Services       Functional Status Assessment Patient has had a recent decline in their functional status and demonstrates the ability to make significant improvements in function in a reasonable and predictable amount of time.     Precautions / Restrictions Precautions Precautions: Fall      Mobility  Bed Mobility Overal bed mobility: Needs Assistance Bed Mobility: Rolling, Sidelying to Sit, Sit to Supine Rolling: Min guard (with  rail) Sidelying to sit: Min guard (with rail)   Sit to supine: Min guard   General bed mobility comments: cued to roll and come up from the side    Transfers Overall transfer level: Needs assistance   Transfers: Sit to/from Stand Sit to Stand: Min guard                Ambulation/Gait Ambulation/Gait assistance: Min assist Gait Distance (Feet): 25 Feet Assistive device: None, IV Pole Gait Pattern/deviations: Step-through pattern   Gait velocity interpretation: <1.31 ft/sec, indicative of household ambulator   General Gait Details: short, tentative steps, generally steady, but slow  Science writer    Modified Rankin (Stroke Patients Only)       Balance Overall balance assessment: Mild deficits observed, not formally tested, Needs assistance   Sitting balance-Leahy Scale: Good     Standing balance support: Single extremity supported, No upper extremity supported, During functional activity Standing balance-Leahy Scale: Fair                               Pertinent Vitals/Pain Pain Assessment Pain Assessment: Faces Faces Pain Scale: Hurts little more Pain Location: LBP Pain Descriptors / Indicators: Discomfort, Guarding Pain Intervention(s): Monitored during session    Home Living Family/patient expects to be discharged to:: Private residence Living Arrangements: Other (Comment);Other relatives (going to sister's home to stay for a while, separating from wife.) Available Help at Discharge: Family Type of Home: House Home Access: Stairs to enter Entrance Stairs-Rails: Left Entrance Stairs-Number of Steps: 2  Home Layout: One level Home Equipment: Grab bars - toilet;Grab bars - tub/shower;Shower seat - built in Additional Comments: Not sure if this is info from sister's home, but matches what pt's relayed    Prior Function Prior Level of Function : Independent/Modified Independent             Mobility  Comments: 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 [employer]: Left    Extremity/Trunk Assessment   Upper Extremity Assessment Upper Extremity Assessment: Overall WFL for tasks assessed    Lower Extremity Assessment Lower Extremity Assessment: Overall WFL for tasks assessed;Generalized weakness    Cervical / Trunk Assessment Cervical / Trunk Assessment: Normal  Communication   Communication: No difficulties  Cognition Arousal/Alertness: Awake/alert Behavior During Therapy: WFL for tasks assessed/performed Overall Cognitive Status: Within Functional Limits for tasks assessed                                          General Comments General comments (skin integrity, edema, etc.): vss    Exercises     Assessment/Plan    PT Assessment Patient needs continued PT services  PT Problem List Decreased strength;Decreased activity tolerance;Decreased mobility;Decreased knowledge of use of DME;Decreased knowledge of precautions;Pain       PT Treatment Interventions DME instruction;Gait training;Stair training;Functional mobility training;Therapeutic activities;Balance training;Patient/family education    PT Goals (Current goals can be found in the Care Plan section)  Acute Rehab PT Goals Patient Stated Goal: back independent and active PT Goal Formulation: With patient Time For Goal Achievement: 01/23/23 Potential to Achieve Goals: Good    Frequency Min 3X/week     Co-evaluation               AM-PAC PT "6 Clicks" Mobility  Outcome Measure Help needed turning from your back to your side while in a flat bed without using bedrails?: A Little Help needed moving from lying on your back to sitting on the side of a flat bed without using bedrails?: A Little Help needed moving to and from a bed to a chair (including a wheelchair)?: A Little Help needed standing up from a chair using your arms  (e.g., wheelchair or bedside chair)?: A Little Help needed to walk in hospital room?: A Little Help needed climbing 3-5 steps with a railing? : A Little 6 Click Score: 18    End of Session   Activity Tolerance: Patient tolerated treatment well;Patient limited by pain;Patient limited by fatigue Patient left: in bed;with call bell/phone within reach Nurse Communication: Mobility status PT Visit Diagnosis: Other abnormalities of gait and mobility (R26.89);Pain;Difficulty in walking, not elsewhere classified (R26.2) Pain - part of body:  (back)    Time: 5361-4431 PT Time Calculation (min) (ACUTE ONLY): 23 min   Charges:   PT Evaluation $PT Eval Moderate Complexity: 1 Mod PT Treatments $Gait Training: 8-22 mins        01/09/2023  Ginger Carne., PT Acute Rehabilitation Services [phone]  (office)  Tessie Fass Keilynn Marano 01/09/2023, 5:56 PM

## 2023-01-09 NOTE — Progress Notes (Addendum)
PROGRESS NOTE    Shawn Daniels  WUJ:[id number] DOB: 1956/04/16 DOA: 01/05/2023 PCP: Ignatius Specking, MD    Brief Narrative:   Shawn Daniels is a 67 y.o. male with past medical history significant for type 2 diabetes mellitus, essential hypertension, peripheral vascular disease, history of Peyronie's disease s/p penile implant who presented to [facility] ED on 1/14 with complaints of low back pain following a fall at home.  Patient reports that his blood sugar was low and fell onto the fireplace hearth.  He reportedly had to crawl to the bathroom.  Imaging studies in the ED revealed L1 compression fracture.  TRH was consulted for admission for further evaluation management.  Assessment & Plan:    Lumbar compression fracture Patient presenting to ED with low back pain following fall at home.  Pelvis x-ray with no acute fracture or dislocation.  Lumbar spine x-ray with anterior compression fracture L1 with 60% height loss.  MR T/L-spine with compression fracture L1 with approximately 50% height loss and 3 mm retropulsion, no acute abnormality of the thoracic spine. -- IR consulted for kyphoplasty; plan for monday -- Dilaudid 1 mg p.o. every 4 hours as needed moderate pain -- Dilaudid 1 mg IV every 2 hours as needed severe breakthrough pain -- Robaxin 500 mg IV every 6 hours as needed muscle spasms -- Holding Plavix, (need 5 day hold per IR; last dose PM of 1/15) -- PT consulted today  Acute renal failure: Resolved Creatinine 0.7 on admission, trended up to 1.97 with correlating findings of bladder distention on imaging.  Likely secondary to bladder outlet obstruction from acute pain as above.  Supported with IV fluid hydration. --Cr 0.79>1.50>1.97>1.07 -- Hold home lisinopril -- Continue monitor urine output  Type 2 diabetes mellitus Hemoglobin A1c 6.9 on 01/06/2023, well-controlled.  Patient with hypoglycemic episode prior to admission, likely secondary to strenuous exercise in the  setting of continued insulin use and poor oral intake leading to fall as above.  Home regimen includes NPH 70/3030 units twice daily and Glyxambi 25-5 mg PO daily -- Diabetic educator following, appreciate assistance -- NPH 15u Goodman BID -- Moderate SSI for coverage -- CBG before every meal/at bedtime  Essential hypertension -- Metoprolol succinate 12.5 mg p.o. daily -- Holding home lisinopril for now secondary to AKI.  HLD: -- Crestor 10 mg p.o. daily -- continue Fenofibrate  -- Resume Repatha injections q. 14 days following discharge  GERD: Continue PPI  Peripheral vascular disease Follows with vascular surgery outpatient, Dr. Myra Gianotti.  Previously underwent right leg revascularization via femoral endarterectomy and SFA stenting.  Recently underwent left lower extremity aortogram with stent placement to left popliteal artery on 11/19/2022. -- Continue aspirin, statin -- holding plavix as above   DVT prophylaxis: SCDs Start: 01/05/23 1731    Code Status: Full Code Family Communication: No family present at bedside this morning.  Disposition Plan:  Level of care: Med-Surg Status is: Inpatient Remains inpatient appropriate because: Awaiting IR for kyphoplasty planned for Monday 1/22, will need PT/OT evaluation following for disposition planning    Consultants:  Interventional radiology  Procedures:  None  Antimicrobials:  None   Subjective: Patient seen examined bedside, resting comfortably.  Lying in bed.  No specific complaints this morning.  Patient requesting and okay for PT evaluation today.    No other questions or concerns at this time.  Denies headache, no dizziness, no chest pain, no palpitations, no shortness of breath, no abdominal pain, no fever/chills/night sweats, no nausea/vomiting/diarrhea, no  focal weakness, no fatigue, no paresthesias.  No acute events overnight per nurse staff.  Awaiting IR kyphoplasty, tentatively scheduled for  Monday.  Objective: Vitals:   01/08/23 1615 01/08/23 2022 01/09/23 0421 01/09/23 0816  BP: (!) 145/85 (!) 147/74 (!) 142/75 (!) 147/56  Pulse: 91 86 80 87  Resp: 16  16 18   Temp: 98.8 F (37.1 C) 98.4 F (36.9 C) 98.1 F (36.7 C) 98.6 F (37 C)  TempSrc: Oral Oral Oral Oral  SpO2: 90% 94% 93% 100%  Weight:      Height:        Intake/Output Summary (Last 24 hours) at 01/09/2023 1209 Last data filed at 01/09/2023 0816 Gross per 24 hour  Intake --  Output 600 ml  Net -600 ml   Filed Weights   01/05/23 0953  Weight: 93.4 kg    Examination:  Physical Exam: GEN: NAD, alert and oriented x 3, wd/wn HEENT: NCAT, PERRL, EOMI, sclera clear, MMM PULM: CTAB w/o wheezes/crackles, normal respiratory effort, on room air CV: RRR w/o M/G/R GI: abd soft, NTND, NABS, no R/G/M MSK: no peripheral edema, muscle strength globally intact 5/5 bilateral upper/lower extremities NEURO: CN II-XII intact, no focal deficits, sensation to light touch intact PSYCH: normal mood/affect Integumentary: dry/intact, no rashes or wounds    Data Reviewed: I have personally reviewed following labs and imaging studies  CBC: Recent Labs  Lab 01/05/23 1111 01/06/23 0256 01/07/23 0924  WBC 9.7 8.5 8.1  NEUTROABS 8.5*  --  6.5  HGB 13.4 12.5* 12.9*  HCT 37.7* 36.3* 36.4*  MCV 92.9 97.6 94.5  PLT 130* 118* 606*   Basic Metabolic Panel: Recent Labs  Lab 01/05/23 1111 01/06/23 0256 01/06/23 1121 01/07/23 0924 01/09/23 0316  NA 136 133* 128* 133* 130*  K 4.0 5.5* 4.5 4.3 4.9  CL 97* 94* 90* 91* 90*  CO2 23 17* 17* 29 23  GLUCOSE 128* 274* 307* 224* 203*  BUN 17 25* 38* 28* 12  CREATININE 0.79 1.50* 1.97* 1.06 1.07  CALCIUM 9.0 9.0 8.7* 8.9 8.8*  MG  --   --   --  2.3  --   PHOS  --   --  4.9* 2.1*  --    GFR: Estimated Creatinine Clearance: 77.8 mL/min (by C-G formula based on SCr of 1.07 mg/dL). Liver Function Tests: Recent Labs  Lab 01/06/23 1121 01/07/23 0924  ALBUMIN 3.9 3.6    No results for input(s): "LIPASE", "AMYLASE" in the last 168 hours. No results for input(s): "AMMONIA" in the last 168 hours. Coagulation Profile: Recent Labs  Lab 01/09/23 0316  INR 1.1   Cardiac Enzymes: Recent Labs  Lab 01/06/23 1121 01/06/23 1904  CKTOTAL 246 214   BNP (last 3 results) No results for input(s): "PROBNP" in the last 8760 hours. HbA1C: No results for input(s): "HGBA1C" in the last 72 hours.  CBG: Recent Labs  Lab 01/08/23 0829 01/08/23 1136 01/08/23 1710 01/08/23 2019 01/09/23 0824  GLUCAP 260* 163* 202* 228* 222*   Lipid Profile: No results for input(s): "CHOL", "HDL", "LDLCALC", "TRIG", "CHOLHDL", "LDLDIRECT" in the last 72 hours. Thyroid Function Tests: Recent Labs    01/06/23 1904  TSH 1.192   Anemia Panel: No results for input(s): "VITAMINB12", "FOLATE", "FERRITIN", "TIBC", "IRON", "RETICCTPCT" in the last 72 hours. Sepsis Labs: No results for input(s): "PROCALCITON", "LATICACIDVEN" in the last 168 hours.  No results found for this or any previous visit (from the past 240 hour(s)).       Radiology  Studies: No results found.      Scheduled Meds:  aspirin EC  81 mg Oral QHS   docusate sodium  100 mg Oral BID   feeding supplement (GLUCERNA SHAKE)  237 mL Oral TID BM   fenofibrate  160 mg Oral Daily   folic acid  1 mg Oral Daily   insulin aspart  0-15 Units Subcutaneous TID WC   insulin aspart  0-5 Units Subcutaneous QHS   insulin aspart protamine- aspart  15 Units Subcutaneous BID WC   lidocaine  3 patch Transdermal Q24H   magnesium oxide  400 mg Oral BID   metoprolol succinate  12.5 mg Oral QHS   multivitamin with minerals  1 tablet Oral Daily   pantoprazole  40 mg Oral Daily   rosuvastatin  10 mg Oral QHS   thiamine  100 mg Oral Daily   Or   thiamine  100 mg Intravenous Daily   Continuous Infusions:  methocarbamol (ROBAXIN) IV       LOS: 4 days    Time spent: 50 minutes spent on chart review, discussion with  nursing staff, consultants, updating family and interview/physical exam; more than 50% of that time was spent in counseling and/or coordination of care.    Akito Boomhower J [place] DO [facility] Available via Epic secure chat 7am-7pm After these hours, please refer to coverage provider listed on [email or web address] 01/09/2023, 12:09 PM

## 2023-01-10 DIAGNOSIS — S32000A Wedge compression fracture of unspecified lumbar vertebra, initial encounter for closed fracture: Secondary | ICD-10-CM | POA: Diagnosis not present

## 2023-01-10 LAB — GLUCOSE, CAPILLARY
Glucose-Capillary: 243 mg/dL — ABNORMAL HIGH (ref 70–99)
Glucose-Capillary: 361 mg/dL — ABNORMAL HIGH (ref 70–99)
Glucose-Capillary: 287 mg/dL — ABNORMAL HIGH (ref 70–99)
Glucose-Capillary: 281 mg/dL — ABNORMAL HIGH (ref 70–99)
Glucose-Capillary: 204 mg/dL — ABNORMAL HIGH (ref 70–99)
Glucose-Capillary: 213 mg/dL — ABNORMAL HIGH (ref 70–99)

## 2023-01-10 NOTE — Progress Notes (Signed)
Physical Therapy Treatment Patient Details Name: Shawn Daniels MRN: [id number] DOB: 1956/05/27 Today's Date: 01/10/2023   History of Present Illness Pt is a 67 y/o male presenting with a fall.  MRI shows compression fx L1 with 50% loss of height.  Kyphoplasty scheduled for Monday.  PMHx:  DM, HTN, PVD.    PT Comments    Pt was seen for mobility on no AD and after declining to use TLSO.  Talked with him about this declination, esp with pt not using an AD.  Pt is somewhat dismissive of the instructions but does understand them.  Written information about back precautions were given to pt as he did not offer this information when asked.  Follow up with repetitive training of body mechanics, of brace application for walks and for safety with all hallway work.  Acute PT goals are outlined on POC.   Recommendations for follow up therapy are one component of a multi-disciplinary discharge planning process, led by the attending physician.  Recommendations may be updated based on patient status, additional functional criteria and insurance authorization.  Follow Up Recommendations  No PT follow up     Assistance Recommended at Discharge Set up Supervision/Assistance  Patient can return home with the following A little help with walking and/or transfers;A little help with bathing/dressing/bathroom;Assistance with cooking/housework;Assist for transportation;Help with stairs or ramp for entrance   Equipment Recommendations  BSC/3in1    Recommendations for Other Services       Precautions / Restrictions Precautions Precautions: Fall Precaution Comments: has lumbar TLSO Required Braces or Orthoses: Spinal Brace Spinal Brace: Applied in sitting position Restrictions Weight Bearing Restrictions: No     Mobility  Bed Mobility Overal bed mobility: Needs Assistance Bed Mobility: Sidelying to Sit, Rolling, Sit to Sidelying Rolling: Min guard Sidelying to sit: Min guard   Sit to supine:  Min guard Sit to sidelying: Min guard General bed mobility comments: required specific prompts about back precautions every time he moved.    Transfers Overall transfer level: Needs assistance Equipment used: Rolling walker (2 wheels) Transfers: Sit to/from Stand Sit to Stand: Min guard           General transfer comment: requires small reminders of sequence on every trial    Ambulation/Gait Ambulation/Gait assistance: Min guard Gait Distance (Feet): 100 Feet Assistive device: None Gait Pattern/deviations: Step-through pattern, Decreased stride length, Wide base of support Gait velocity: reduced Gait velocity interpretation: <1.31 ft/sec, indicative of household ambulator Pre-gait activities: standing balance ck General Gait Details: short laterally deviating steps with pt reporting he is not in need of brace or AD   Stairs             Wheelchair Mobility    Modified Rankin (Stroke Patients Only)       Balance Overall balance assessment: Needs assistance Sitting-balance support: Feet supported Sitting balance-Leahy Scale: Good     Standing balance support: Single extremity supported, During functional activity Standing balance-Leahy Scale: Fair Standing balance comment: less than fair dynamically at times                            Cognition Arousal/Alertness: Awake/alert Behavior During Therapy: WFL for tasks assessed/performed Overall Cognitive Status: No family/caregiver present to determine baseline cognitive functioning                                 General Comments:  Pt is having trouble committing to instructions for spine support, provided written instructions that he did not attend to initially        Exercises      General Comments General comments (skin integrity, edema, etc.): Pt is declining to use the TLSO, but with the brace could make his balance better as well as slow down his speed and reduce safety of  gait      Pertinent Vitals/Pain Pain Assessment Pain Assessment: Faces Faces Pain Scale: Hurts little more Pain Location: low back Pain Descriptors / Indicators: Guarding, Grimacing Pain Intervention(s): Limited activity within patient's tolerance, Monitored during session, Premedicated before session, Repositioned    Home Living                          Prior Function            PT Goals (current goals can now be found in the care plan section) Acute Rehab PT Goals Patient Stated Goal: to recover PLOF Progress towards PT goals: Progressing toward goals    Frequency    Min 3X/week      PT Plan Equipment recommendations need to be updated    Co-evaluation              AM-PAC PT "6 Clicks" Mobility   Outcome Measure  Help needed turning from your back to your side while in a flat bed without using bedrails?: A Little Help needed moving from lying on your back to sitting on the side of a flat bed without using bedrails?: A Little Help needed moving to and from a bed to a chair (including a wheelchair)?: A Little Help needed standing up from a chair using your arms (e.g., wheelchair or bedside chair)?: A Little Help needed to walk in hospital room?: A Little Help needed climbing 3-5 steps with a railing? : A Little 6 Click Score: 18    End of Session Equipment Utilized During Treatment: Gait belt Activity Tolerance: Patient limited by fatigue;Treatment limited secondary to medical complications (Comment) Patient left: in bed;with call bell/phone within reach;with bed alarm set Nurse Communication: Mobility status PT Visit Diagnosis: Other abnormalities of gait and mobility (R26.89);Pain;Difficulty in walking, not elsewhere classified (R26.2) Pain - Right/Left:  (back) Pain - part of body:  (back)     Time: 1540-0867 PT Time Calculation (min) (ACUTE ONLY): 30 min  Charges:  $Gait Training: 8-22 mins $Therapeutic Activity: 8-22 mins       Ramond Dial 01/10/2023, 4:04 PM  Mee Hives, PT PhD Acute Rehab Dept. Number: [place] [facility] [place]

## 2023-01-10 NOTE — Inpatient Diabetes Management (Signed)
Inpatient Diabetes Program Recommendations  AACE/ADA: New Consensus Statement on Inpatient Glycemic Control (2015)  Target Ranges:  Prepandial:   less than 140 mg/dL      Peak postprandial:   less than 180 mg/dL (1-2 hours)      Critically ill patients:  140 - 180 mg/dL   Lab Results  Component Value Date   GLUCAP 361 (H) 01/10/2023   HGBA1C 6.9 (H) 01/06/2023    Latest Reference Range & Units 01/09/23 08:24 01/09/23 12:27 01/09/23 21:37 01/10/23 04:06 01/10/23 09:03  Glucose-Capillary 70 - 99 mg/dL 222 (H) 264 (H) 228 (H) 243 (H) 361 (H)  (H): Data is abnormally high  Diabetes history: DM 1 Outpatient Diabetes medications:  70/30 30 units bid Current orders for Inpatient glycemic control:  Novolog moderate tid with meals and HS 70/30 15 units bid  Inpatient Diabetes Program Recommendations:   Patient did not receive pm 70/30 dose yesterday. Fasting CBG this am 361.   Thank you, Nani Gasser. Faryal Marxen, RN, MSN, CDE  Diabetes Coordinator Inpatient Glycemic Control Team Team Pager [phone] (8am-5pm) 01/10/2023 11:06 AM

## 2023-01-10 NOTE — Progress Notes (Signed)
PROGRESS NOTE    Shawn Daniels  PXT:[id number] DOB: 1956/06/30 DOA: 01/05/2023 PCP: Glenda Chroman, MD    Brief Narrative:   Shawn Daniels is a 67 y.o. male with past medical history significant for type 2 diabetes mellitus, essential hypertension, peripheral vascular disease, history of Peyronie's disease s/p penile implant who presented to [facility] ED on 1/14 with complaints of low back pain following a fall at home.  Patient reports that his blood sugar was low and fell onto the fireplace hearth.  He reportedly had to crawl to the bathroom.  Imaging studies in the ED revealed L1 compression fracture.  TRH was consulted for admission for further evaluation management.  Assessment & Plan:    Lumbar compression fracture Patient presenting to ED with low back pain following fall at home.  Pelvis x-ray with no acute fracture or dislocation.  Lumbar spine x-ray with anterior compression fracture L1 with 60% height loss.  MR T/L-spine with compression fracture L1 with approximately 50% height loss and 3 mm retropulsion, no acute abnormality of the thoracic spine. -- IR consulted for kyphoplasty; plan for Monday 1/22 -- Dilaudid 1 mg p.o. every 4 hours as needed moderate pain -- Dilaudid 1 mg IV every 2 hours as needed severe breakthrough pain -- Robaxin 500 mg IV every 6 hours as needed muscle spasms -- Holding Plavix, (need 5 day hold per IR; last dose PM of 1/15) -- PT eval on 1/18, no needs identified on discharge  Acute renal failure: Resolved Creatinine 0.7 on admission, trended up to 1.97 with correlating findings of bladder distention on imaging.  Likely secondary to bladder outlet obstruction from acute pain as above.  Supported with IV fluid hydration. --Cr 0.79>1.50>1.97>1.07 -- Hold home lisinopril -- Continue monitor urine output  Type 2 diabetes mellitus Hemoglobin A1c 6.9 on 01/06/2023, well-controlled.  Patient with hypoglycemic episode prior to admission, likely  secondary to strenuous exercise in the setting of continued insulin use and poor oral intake leading to fall as above.  Home regimen includes NPH 70/3030 units twice daily and Glyxambi 25-5 mg PO daily -- Diabetic educator following, appreciate assistance -- NPH 15u Quinlan BID -- Moderate SSI for coverage -- CBG before every meal/at bedtime  Essential hypertension -- Metoprolol succinate 12.5 mg p.o. daily -- Holding home lisinopril for now secondary to AKI.  HLD: -- Crestor 10 mg p.o. daily -- continue Fenofibrate  -- Resume Repatha injections q. 14 days following discharge  GERD: Continue PPI  Peripheral vascular disease Follows with vascular surgery outpatient, Dr. Trula Slade.  Previously underwent right leg revascularization via femoral endarterectomy and SFA stenting.  Recently underwent left lower extremity aortogram with stent placement to left popliteal artery on 11/19/2022. -- Continue aspirin, statin -- holding plavix as above   DVT prophylaxis: SCDs Start: 01/05/23 1731    Code Status: Full Code Family Communication: No family present at bedside this morning.  Disposition Plan:  Level of care: Med-Surg Status is: Inpatient Remains inpatient appropriate because: Awaiting IR for kyphoplasty planned for Monday 1/22, will need PT/OT evaluation following for disposition planning    Consultants:  Interventional radiology  Procedures:  None  Antimicrobials:  None   Subjective: Patient seen examined bedside, resting comfortably.  Lying in bed.  Continues with multiple complaints, mainly related to his diabetes control.  States "staff not doing their job".  Although patient has been offered insulin on multiple occasions, has declined insulin injections, and has not approved of his evening NPH over the  last 2 days.  Discussed with him that if he does not take his longer acting insulin twice daily obviously his glucose will be not well-controlled.  Continues to perseverate on  getting his home insulin from home and doing it himself.  Seen by PT with no recommendations on discharge.   Denies headache, no dizziness, no chest pain, no palpitations, no shortness of breath, no abdominal pain, no fever/chills/night sweats, no nausea/vomiting/diarrhea, no focal weakness, no fatigue, no paresthesias.  No acute events overnight per nurse staff.  Awaiting IR kyphoplasty, tentatively scheduled for Monday.  Objective: Vitals:   01/09/23 0816 01/09/23 1953 01/10/23 0408 01/10/23 0906  BP: (!) 147/56 (!) 158/71 (!) 151/78 123/68  Pulse: 87 84 81 99  Resp: 18 18 18 17   Temp: 98.6 F (37 C) 98.7 F (37.1 C) 98.3 F (36.8 C) 97.7 F (36.5 C)  TempSrc: Oral Oral Oral Oral  SpO2: 100% 95% 99% 98%  Weight:      Height:        Intake/Output Summary (Last 24 hours) at 01/10/2023 1303 Last data filed at 01/09/2023 2121 Gross per 24 hour  Intake 360 ml  Output 900 ml  Net -540 ml   Filed Weights   01/05/23 0953  Weight: 93.4 kg    Examination:  Physical Exam: GEN: NAD, alert and oriented x 3, wd/wn HEENT: NCAT, PERRL, EOMI, sclera clear, MMM PULM: CTAB w/o wheezes/crackles, normal respiratory effort, on room air CV: RRR w/o M/G/R GI: abd soft, NTND, NABS, no R/G/M MSK: no peripheral edema, muscle strength globally intact 5/5 bilateral upper/lower extremities NEURO: CN II-XII intact, no focal deficits, sensation to light touch intact PSYCH: normal mood/affect Integumentary: dry/intact, no rashes or wounds    Data Reviewed: I have personally reviewed following labs and imaging studies  CBC: Recent Labs  Lab 01/05/23 1111 01/06/23 0256 01/07/23 0924  WBC 9.7 8.5 8.1  NEUTROABS 8.5*  --  6.5  HGB 13.4 12.5* 12.9*  HCT 37.7* 36.3* 36.4*  MCV 92.9 97.6 94.5  PLT 130* 118* 115*   Basic Metabolic Panel: Recent Labs  Lab 01/05/23 1111 01/06/23 0256 01/06/23 1121 01/07/23 0924 01/09/23 0316  NA 136 133* 128* 133* 130*  K 4.0 5.5* 4.5 4.3 4.9  CL 97* 94*  90* 91* 90*  CO2 23 17* 17* 29 23  GLUCOSE 128* 274* 307* 224* 203*  BUN 17 25* 38* 28* 12  CREATININE 0.79 1.50* 1.97* 1.06 1.07  CALCIUM 9.0 9.0 8.7* 8.9 8.8*  MG  --   --   --  2.3  --   PHOS  --   --  4.9* 2.1*  --    GFR: Estimated Creatinine Clearance: 77.8 mL/min (by C-G formula based on SCr of 1.07 mg/dL). Liver Function Tests: Recent Labs  Lab 01/06/23 1121 01/07/23 0924  ALBUMIN 3.9 3.6   No results for input(s): "LIPASE", "AMYLASE" in the last 168 hours. No results for input(s): "AMMONIA" in the last 168 hours. Coagulation Profile: Recent Labs  Lab 01/09/23 0316  INR 1.1   Cardiac Enzymes: Recent Labs  Lab 01/06/23 1121 01/06/23 1904  CKTOTAL 246 214   BNP (last 3 results) No results for input(s): "PROBNP" in the last 8760 hours. HbA1C: No results for input(s): "HGBA1C" in the last 72 hours.  CBG: Recent Labs  Lab 01/09/23 1227 01/09/23 2137 01/10/23 0406 01/10/23 0903 01/10/23 1136  GLUCAP 264* 228* 243* 361* 287*   Lipid Profile: No results for input(s): "CHOL", "HDL", "LDLCALC", "TRIG", "  CHOLHDL", "LDLDIRECT" in the last 72 hours. Thyroid Function Tests: No results for input(s): "TSH", "T4TOTAL", "FREET4", "T3FREE", "THYROIDAB" in the last 72 hours.  Anemia Panel: No results for input(s): "VITAMINB12", "FOLATE", "FERRITIN", "TIBC", "IRON", "RETICCTPCT" in the last 72 hours. Sepsis Labs: No results for input(s): "PROCALCITON", "LATICACIDVEN" in the last 168 hours.  No results found for this or any previous visit (from the past 240 hour(s)).       Radiology Studies: No results found.      Scheduled Meds:  aspirin EC  81 mg Oral QHS   docusate sodium  100 mg Oral BID   feeding supplement (GLUCERNA SHAKE)  237 mL Oral TID BM   fenofibrate  160 mg Oral Daily   folic acid  1 mg Oral Daily   insulin aspart  0-15 Units Subcutaneous TID WC   insulin aspart  0-5 Units Subcutaneous QHS   insulin aspart protamine- aspart  15 Units  Subcutaneous BID WC   lidocaine  3 patch Transdermal Q24H   magnesium oxide  400 mg Oral BID   metoprolol succinate  12.5 mg Oral QHS   multivitamin with minerals  1 tablet Oral Daily   pantoprazole  40 mg Oral Daily   rosuvastatin  10 mg Oral QHS   thiamine  100 mg Oral Daily   Or   thiamine  100 mg Intravenous Daily   Continuous Infusions:  methocarbamol (ROBAXIN) IV       LOS: 5 days    Time spent: 50 minutes spent on chart review, discussion with nursing staff, consultants, updating family and interview/physical exam; more than 50% of that time was spent in counseling and/or coordination of care.    Shawn Mena J [place] DO [facility] Available via Epic secure chat 7am-7pm After these hours, please refer to coverage provider listed on [email or web address] 01/10/2023, 1:03 PM

## 2023-01-10 NOTE — Progress Notes (Signed)
Patient wants to sleep and complaining  of not getting his PRN pain medication, patient educated that administration is based of patients's request. Pain medication given on a pain scale of 9/10 and asked to call for his PRN pain medication, will continue to monitor.

## 2023-01-10 NOTE — Progress Notes (Signed)
Notified Dr. [place] about patient's request to take less insulin than prescribed according to sliding scale.

## 2023-01-10 NOTE — Plan of Care (Signed)
  Problem: Education: Goal: Knowledge of General Education information will improve Description: Including pain rating scale, medication(s)/side effects and non-pharmacologic comfort measures Outcome: Progressing

## 2023-01-10 NOTE — Progress Notes (Signed)
Mobility Specialist - Progress Note   01/10/23 1000  Mobility  Activity Ambulated with assistance to bathroom;Ambulated with assistance in room  Level of Assistance Standby assist, set-up cues, supervision of patient - no hands on  Assistive Device Other (Comment) (IV Pole)  Distance Ambulated (ft) 60 ft  Activity Response Tolerated fair  Mobility Referral Yes  $Mobility charge 1 Mobility    Pt received in bed agreeable to mobility. C/o fatigue and SOB, stated this was d/t not receiving insulin at correct time affecting his blood sugar. Also stated recent dose of dilaudid made him feel a little dizzy. Demonstrated understanding of back precautions and log rolling to exit bed. Ambulated laps within room. Left in bed w/ call bell in reach and all needs met.   [place] Specialist Please contact via SecureChat or Rehab office at [phone]

## 2023-01-10 NOTE — Progress Notes (Signed)
Had a long discussion with patient about his diabetes and management while in the hospital. We agreed that a staff member would check CBG as often as patient requested on this nurse's shift with the intention of bring patient's blood sugar in to a range that he is comfortable with and then managing.

## 2023-01-10 NOTE — Progress Notes (Signed)
Patient  is complaining of not getting his insulin,patient received his insulin coverage at bedtime ,patient blood sugar spot checked at 0406 was 243,patient is still complaining of not getting his insulin,on call informed,patient want to sleep,will continue to monitor.

## 2023-01-11 DIAGNOSIS — S32000A Wedge compression fracture of unspecified lumbar vertebra, initial encounter for closed fracture: Secondary | ICD-10-CM | POA: Diagnosis not present

## 2023-01-11 LAB — GLUCOSE, CAPILLARY
Glucose-Capillary: 231 mg/dL — ABNORMAL HIGH (ref 70–99)
Glucose-Capillary: 165 mg/dL — ABNORMAL HIGH (ref 70–99)
Glucose-Capillary: 228 mg/dL — ABNORMAL HIGH (ref 70–99)
Glucose-Capillary: 107 mg/dL — ABNORMAL HIGH (ref 70–99)
Glucose-Capillary: 192 mg/dL — ABNORMAL HIGH (ref 70–99)
Glucose-Capillary: 205 mg/dL — ABNORMAL HIGH (ref 70–99)

## 2023-01-11 NOTE — Progress Notes (Signed)
Physical Therapy Treatment Patient Details Name: Shawn Daniels MRN: [id number] DOB: [birth year] Today's Date: 01/11/2023   History of Present Illness Pt is a 67 y/o male presenting with a fall.  MRI shows compression fx L1 with 50% loss of height.  Kyphoplasty scheduled for Monday.  PMHx:  DM, HTN, PVD.    PT Comments    Pt greeted propped in bed with c/o fatigue and pain. Pt stating he had been up in room ambulating quite a bit and spent time up in chair. Pt able to recall all back precautions, however pt continues to decline TSLO wear despite education and encouragement. Pt agreeable to standing balance challenges only and able to perform all without LOB in static standing. Pt continues to need single UE support during dynamic tasks. Reinforced education re; brace wear and importance, activity recommendations and importance of continued mobility with pt verbalizing understanding. Pt continues to benefit from skilled PT services to progress toward functional mobility goals.    Recommendations for follow up therapy are one component of a multi-disciplinary discharge planning process, led by the attending physician.  Recommendations may be updated based on patient status, additional functional criteria and insurance authorization.  Follow Up Recommendations  No PT follow up     Assistance Recommended at Discharge Set up Supervision/Assistance  Patient can return home with the following A little help with walking and/or transfers;A little help with bathing/dressing/bathroom;Assistance with cooking/housework;Assist for transportation;Help with stairs or ramp for entrance   Equipment Recommendations  BSC/3in1    Recommendations for Other Services       Precautions / Restrictions Precautions Precautions: Fall Precaution Comments: has lumbar TLSO Required Braces or Orthoses: Spinal Brace Spinal Brace: Other (comment) (refused to don) Restrictions Weight Bearing Restrictions: No      Mobility  Bed Mobility Overal bed mobility: Needs Assistance Bed Mobility: Sidelying to Sit, Rolling, Sit to Sidelying Rolling: Min guard Sidelying to sit: Min guard   Sit to supine: Min guard   General bed mobility comments: good log roll technique and good dherence to back precautions    Transfers Overall transfer level: Needs assistance Equipment used: None Transfers: Sit to/from Stand Sit to Stand: Min guard           General transfer comment: min guard for safety    Ambulation/Gait               General Gait Details: pt declining ambulation stating he has been up in room walking and is now too fatigued.   Stairs             Wheelchair Mobility    Modified Rankin (Stroke Patients Only)       Balance Overall balance assessment: Needs assistance Sitting-balance support: Feet supported Sitting balance-Leahy Scale: Good     Standing balance support: During functional activity, No upper extremity supported, Single extremity supported Standing balance-Leahy Scale: Fair Standing balance comment: able to complete static balance challenges without LOB, needing single UE support to come to tandem stance     Tandem Stance - Right Leg: 60 Tandem Stance - Left Leg: 60 [place] - Eyes Opened: 60 [place] - Eyes Closed: 30                Cognition Arousal/Alertness: Awake/alert Behavior During Therapy: WFL for tasks assessed/performed Overall Cognitive Status: No family/caregiver present to determine baseline cognitive functioning  General Comments: Pt is having trouble committing to instructions for spine support, reinforced education on importance of TSLO brace despite pt refusal        Exercises      General Comments General comments (skin integrity, edema, etc.): Pt continues to decline use of the TLSO      Pertinent Vitals/Pain Pain Assessment Pain Assessment: Faces Faces Pain  Scale: Hurts little more Pain Location: low back Pain Descriptors / Indicators: Guarding Pain Intervention(s): Monitored during session, Limited activity within patient's tolerance, Patient requesting pain meds-RN notified    Home Living                          Prior Function            PT Goals (current goals can now be found in the care plan section) Acute Rehab PT Goals Patient Stated Goal: to get back to PLOF PT Goal Formulation: With patient Time For Goal Achievement: 01/23/23 Progress towards PT goals: Progressing toward goals    Frequency    Min 3X/week      PT Plan      Co-evaluation              AM-PAC PT "6 Clicks" Mobility   Outcome Measure  Help needed turning from your back to your side while in a flat bed without using bedrails?: A Little Help needed moving from lying on your back to sitting on the side of a flat bed without using bedrails?: A Little Help needed moving to and from a bed to a chair (including a wheelchair)?: A Little Help needed standing up from a chair using your arms (e.g., wheelchair or bedside chair)?: A Little Help needed to walk in hospital room?: A Little Help needed climbing 3-5 steps with a railing? : A Little 6 Click Score: 18    End of Session   Activity Tolerance: Patient limited by fatigue Patient left: in bed;with call bell/phone within reach (seated EOB) Nurse Communication: Mobility status (pt position at EOB) PT Visit Diagnosis: Other abnormalities of gait and mobility (R26.89);Pain;Difficulty in walking, not elsewhere classified (R26.2) Pain - Right/Left:  (back) Pain - part of body:  (back)     Time: 6283-6629 PT Time Calculation (min) (ACUTE ONLY): 14 min  Charges:  $Therapeutic Activity: 8-22 mins                     Kenwood Rosiak R. PTA Acute Rehabilitation Services Office: [place] 01/11/2023, 3:13 PM

## 2023-01-11 NOTE — Plan of Care (Signed)

## 2023-01-11 NOTE — Progress Notes (Signed)
PROGRESS NOTE    Shawn Daniels  DGL:[id number] DOB: [birth year] DOA: 01/05/2023 PCP: Glenda Chroman, MD    Brief Narrative:   NICOLES SEDLACEK is a 67 y.o. male with past medical history significant for type 2 diabetes mellitus, essential hypertension, peripheral vascular disease, history of Peyronie's disease s/p penile implant who presented to [facility] ED on 1/14 with complaints of low back pain following a fall at home.  Patient reports that his blood sugar was low and fell onto the fireplace hearth.  He reportedly had to crawl to the bathroom.  Imaging studies in the ED revealed L1 compression fracture.  TRH was consulted for admission for further evaluation management.  Assessment & Plan:    Lumbar compression fracture Patient presenting to ED with low back pain following fall at home.  Pelvis x-ray with no acute fracture or dislocation.  Lumbar spine x-ray with anterior compression fracture L1 with 60% height loss.  MR T/L-spine with compression fracture L1 with approximately 50% height loss and 3 mm retropulsion, no acute abnormality of the thoracic spine. -- IR consulted for kyphoplasty; plan for Monday 1/22 -- Dilaudid 1 mg p.o. every 4 hours as needed moderate pain -- Dilaudid 1 mg IV every 2 hours as needed severe breakthrough pain -- Robaxin 500 mg IV every 6 hours as needed muscle spasms -- Holding Plavix, (need 5 day hold per IR; last dose PM of 1/15) -- PT eval on 1/18, no needs identified on discharge  Acute renal failure: Resolved Creatinine 0.7 on admission, trended up to 1.97 with correlating findings of bladder distention on imaging.  Likely secondary to bladder outlet obstruction from acute pain as above.  Supported with IV fluid hydration. --Cr 0.79>1.50>1.97>1.07 -- Hold home lisinopril -- Continue monitor urine output  Type 2 diabetes mellitus Hemoglobin A1c 6.9 on 01/06/2023, well-controlled.  Patient with hypoglycemic episode prior to admission, likely  secondary to strenuous exercise in the setting of continued insulin use and poor oral intake leading to fall as above.  Home regimen includes NPH 70/3030 units twice daily and Glyxambi 25-5 mg PO daily -- Diabetic educator following, appreciate assistance -- NPH 15u [place] BID -- Moderate SSI for coverage -- CBG before every meal/at bedtime  Essential hypertension -- Metoprolol succinate 12.5 mg p.o. daily -- Holding home lisinopril for now secondary to AKI.  HLD: -- Crestor 10 mg p.o. daily -- continue Fenofibrate  -- Resume Repatha injections q. 14 days following discharge  GERD: Continue PPI  Peripheral vascular disease Follows with vascular surgery outpatient, Dr. Trula Slade.  Previously underwent right leg revascularization via femoral endarterectomy and SFA stenting.  Recently underwent left lower extremity aortogram with stent placement to left popliteal artery on 11/19/2022. -- Continue aspirin, statin -- holding plavix as above   DVT prophylaxis: SCDs Start: 01/05/23 1731    Code Status: Full Code Family Communication: No family present at bedside this morning.  Disposition Plan:  Level of care: Med-Surg Status is: Inpatient Remains inpatient appropriate because: Awaiting IR for kyphoplasty planned for Monday 1/22    Consultants:  Interventional radiology  Procedures:  None  Antimicrobials:  None   Subjective: Patient seen examined bedside, resting comfortably.  Lying in bed.  Sleeping but easily arousable.  No specific complaints this morning.  States "finally staff has gotten my insulin and glucose better controlled".  Reports that worked with PT yesterday, did sit in the chair for some time.  Eager to mobilize more.  No other specific questions or concerns  at this time.   Denies headache, no dizziness, no chest pain, no palpitations, no shortness of breath, no abdominal pain, no fever/chills/night sweats, no nausea/vomiting/diarrhea, no focal weakness, no fatigue, no  paresthesias.  No acute events overnight per nurse staff.  Awaiting IR kyphoplasty, tentatively scheduled for Monday.  Objective: Vitals:   01/10/23 1621 01/10/23 2032 01/11/23 0445 01/11/23 0745  BP: 113/72 (!) 141/67 121/75 128/75  Pulse: 98 95 92 83  Resp: 16 17 18 17   Temp: 98.3 F (36.8 C) 97.8 F (36.6 C) 97.8 F (36.6 C) 98.4 F (36.9 C)  TempSrc: Oral Oral Oral Oral  SpO2: 96% 97% 95% 98%  Weight:      Height:        Intake/Output Summary (Last 24 hours) at 01/11/2023 1105 Last data filed at 01/11/2023 0900 Gross per 24 hour  Intake 240 ml  Output --  Net 240 ml   Filed Weights   01/05/23 0953  Weight: 93.4 kg    Examination:  Physical Exam: GEN: NAD, alert and oriented x 3, wd/wn HEENT: NCAT, PERRL, EOMI, sclera clear, MMM PULM: CTAB w/o wheezes/crackles, normal respiratory effort, on room air CV: RRR w/o M/G/R GI: abd soft, NTND, NABS, no R/G/M MSK: no peripheral edema, muscle strength globally intact 5/5 bilateral upper/lower extremities NEURO: CN II-XII intact, no focal deficits, sensation to light touch intact PSYCH: normal mood/affect Integumentary: dry/intact, no rashes or wounds    Data Reviewed: I have personally reviewed following labs and imaging studies  CBC: Recent Labs  Lab 01/05/23 1111 01/06/23 0256 01/07/23 0924  WBC 9.7 8.5 8.1  NEUTROABS 8.5*  --  6.5  HGB 13.4 12.5* 12.9*  HCT 37.7* 36.3* 36.4*  MCV 92.9 97.6 94.5  PLT 130* 118* 115*   Basic Metabolic Panel: Recent Labs  Lab 01/05/23 1111 01/06/23 0256 01/06/23 1121 01/07/23 0924 01/09/23 0316  NA 136 133* 128* 133* 130*  K 4.0 5.5* 4.5 4.3 4.9  CL 97* 94* 90* 91* 90*  CO2 23 17* 17* 29 23  GLUCOSE 128* 274* 307* 224* 203*  BUN 17 25* 38* 28* 12  CREATININE 0.79 1.50* 1.97* 1.06 1.07  CALCIUM 9.0 9.0 8.7* 8.9 8.8*  MG  --   --   --  2.3  --   PHOS  --   --  4.9* 2.1*  --    GFR: Estimated Creatinine Clearance: 77.8 mL/min (by C-G formula based on SCr of 1.07  mg/dL). Liver Function Tests: Recent Labs  Lab 01/06/23 1121 01/07/23 0924  ALBUMIN 3.9 3.6   No results for input(s): "LIPASE", "AMYLASE" in the last 168 hours. No results for input(s): "AMMONIA" in the last 168 hours. Coagulation Profile: Recent Labs  Lab 01/09/23 0316  INR 1.1   Cardiac Enzymes: Recent Labs  Lab 01/06/23 1121 01/06/23 1904  CKTOTAL 246 214   BNP (last 3 results) No results for input(s): "PROBNP" in the last 8760 hours. HbA1C: No results for input(s): "HGBA1C" in the last 72 hours.  CBG: Recent Labs  Lab 01/10/23 1415 01/10/23 1621 01/10/23 2136 01/10/23 2159 01/11/23 0747  GLUCAP 281* 204* 231* 213* 165*   Lipid Profile: No results for input(s): "CHOL", "HDL", "LDLCALC", "TRIG", "CHOLHDL", "LDLDIRECT" in the last 72 hours. Thyroid Function Tests: No results for input(s): "TSH", "T4TOTAL", "FREET4", "T3FREE", "THYROIDAB" in the last 72 hours.  Anemia Panel: No results for input(s): "VITAMINB12", "FOLATE", "FERRITIN", "TIBC", "IRON", "RETICCTPCT" in the last 72 hours. Sepsis Labs: No results for input(s): "PROCALCITON", "LATICACIDVEN"  in the last 168 hours.  No results found for this or any previous visit (from the past 240 hour(s)).       Radiology Studies: No results found.      Scheduled Meds:  aspirin EC  81 mg Oral QHS   docusate sodium  100 mg Oral BID   feeding supplement (GLUCERNA SHAKE)  237 mL Oral TID BM   fenofibrate  160 mg Oral Daily   folic acid  1 mg Oral Daily   insulin aspart  0-15 Units Subcutaneous TID WC   insulin aspart  0-5 Units Subcutaneous QHS   insulin aspart protamine- aspart  15 Units Subcutaneous BID WC   lidocaine  3 patch Transdermal Q24H   magnesium oxide  400 mg Oral BID   metoprolol succinate  12.5 mg Oral QHS   multivitamin with minerals  1 tablet Oral Daily   pantoprazole  40 mg Oral Daily   rosuvastatin  10 mg Oral QHS   thiamine  100 mg Oral Daily   Or   thiamine  100 mg Intravenous  Daily   Continuous Infusions:  methocarbamol (ROBAXIN) IV       LOS: 6 days    Time spent: 50 minutes spent on chart review, discussion with nursing staff, consultants, updating family and interview/physical exam; more than 50% of that time was spent in counseling and/or coordination of care.    Herb Beltre J [place] DO [facility] Available via Epic secure chat 7am-7pm After these hours, please refer to coverage provider listed on [email or web address] 01/11/2023, 11:05 AM

## 2023-01-12 DIAGNOSIS — S32000A Wedge compression fracture of unspecified lumbar vertebra, initial encounter for closed fracture: Secondary | ICD-10-CM | POA: Diagnosis not present

## 2023-01-12 LAB — GLUCOSE, CAPILLARY
Glucose-Capillary: 198 mg/dL — ABNORMAL HIGH (ref 70–99)
Glucose-Capillary: 221 mg/dL — ABNORMAL HIGH (ref 70–99)
Glucose-Capillary: 238 mg/dL — ABNORMAL HIGH (ref 70–99)
Glucose-Capillary: 210 mg/dL — ABNORMAL HIGH (ref 70–99)

## 2023-01-12 MED ORDER — METHOCARBAMOL 500 MG PO TABS
500.0000 mg | ORAL_TABLET | Freq: Four times a day (QID) | ORAL | Status: DC | PRN
  Administered 2023-01-12 – 2023-01-14 (×2): 500 mg via ORAL
  Filled 2023-01-12 (×3): qty 1

## 2023-01-12 NOTE — Plan of Care (Signed)
Will continue to monitor.

## 2023-01-12 NOTE — Progress Notes (Signed)
PROGRESS NOTE    Shawn Daniels  RKY:[id number] DOB: 03-20-1956 DOA: 01/05/2023 PCP: Ignatius Specking, MD    Brief Narrative:   Shawn Daniels is a 67 y.o. male with past medical history significant for type 2 diabetes mellitus, essential hypertension, peripheral vascular disease, history of Peyronie's disease s/p penile implant who presented to [facility] ED on 1/14 with complaints of low back pain following a fall at home.  Patient reports that his blood sugar was low and fell onto the fireplace hearth.  He reportedly had to crawl to the bathroom.  Imaging studies in the ED revealed L1 compression fracture.  TRH was consulted for admission for further evaluation management.  Assessment & Plan:    Lumbar compression fracture Patient presenting to ED with low back pain following fall at home.  Pelvis x-ray with no acute fracture or dislocation.  Lumbar spine x-ray with anterior compression fracture L1 with 60% height loss.  MR T/L-spine with compression fracture L1 with approximately 50% height loss and 3 mm retropulsion, no acute abnormality of the thoracic spine. -- IR consulted for kyphoplasty; plan for Monday 1/22; n.p.o. after midnight -- Dilaudid 1 mg p.o. every 4 hours as needed moderate pain -- Dilaudid 1 mg IV every 2 hours as needed severe breakthrough pain -- Robaxin 500 mg PO every 6 hours as needed muscle spasms -- Holding Plavix, (need 5 day hold per IR; last dose PM of 1/15) -- PT eval on 1/18, no needs identified on discharge  Acute renal failure: Resolved Creatinine 0.7 on admission, trended up to 1.97 with correlating findings of bladder distention on imaging.  Likely secondary to bladder outlet obstruction from acute pain as above.  Supported with IV fluid hydration. --Cr 0.79>1.50>1.97>1.07 -- Hold home lisinopril -- Continue monitor urine output -- BMP in a.m.  Type 2 diabetes mellitus Hemoglobin A1c 6.9 on 01/06/2023, well-controlled.  Patient with hypoglycemic  episode prior to admission, likely secondary to strenuous exercise in the setting of continued insulin use and poor oral intake leading to fall as above.  Home regimen includes NPH 70/3030 units twice daily and Glyxambi 25-5 mg PO daily -- Diabetic educator following, appreciate assistance -- NPH 15u  BID -- Moderate SSI for coverage -- CBG before every meal/at bedtime  Essential hypertension -- Metoprolol succinate 12.5 mg p.o. daily -- Holding home lisinopril for now secondary to AKI.  HLD: -- Crestor 10 mg p.o. daily -- continue Fenofibrate  -- Resume Repatha injections q. 14 days following discharge  GERD: Continue PPI  Peripheral vascular disease Follows with vascular surgery outpatient, Dr. Myra Gianotti.  Previously underwent right leg revascularization via femoral endarterectomy and SFA stenting.  Recently underwent left lower extremity aortogram with stent placement to left popliteal artery on 11/19/2022. -- Continue aspirin, statin -- holding plavix as above   DVT prophylaxis: SCDs Start: 01/05/23 1731    Code Status: Full Code Family Communication: No family present at bedside this morning.  Disposition Plan:  Level of care: Med-Surg Status is: Inpatient Remains inpatient appropriate because: Awaiting IR for kyphoplasty planned for tomorrow, n.p.o. after midnight    Consultants:  Interventional radiology  Procedures:  None  Antimicrobials:  None   Subjective: Patient seen examined bedside, resting comfortably.  Lying in bed.  Reports took a shower this morning, water was "cold".  Feels like he may have "overdid it this morning".  Now complaining of some mild muscle spasms.  Discussed that he has Robaxin ordered if needed.  No other specific  questions or concerns at this time.   Denies headache, no dizziness, no chest pain, no palpitations, no shortness of breath, no abdominal pain, no fever/chills/night sweats, no nausea/vomiting/diarrhea, no focal weakness, no  fatigue, no paresthesias.  No acute events overnight per nurse staff.  Awaiting IR kyphoplasty, tentatively scheduled for tomorrow, n.p.o. after midnight.  Objective: Vitals:   01/11/23 1516 01/11/23 2121 01/12/23 0448 01/12/23 0752  BP: 134/76 138/75 127/70 (!) 147/72  Pulse: 93 88 84 76  Resp: 18 18 18 17   Temp: 98.2 F (36.8 C) 97.7 F (36.5 C) 97.8 F (36.6 C) 98.3 F (36.8 C)  TempSrc: Oral Oral Oral Oral  SpO2: 96% 96% 98% 96%  Weight:      Height:        Intake/Output Summary (Last 24 hours) at 01/12/2023 1004 Last data filed at 01/12/2023 0920 Gross per 24 hour  Intake 840 ml  Output --  Net 840 ml   Filed Weights   01/05/23 0953  Weight: 93.4 kg    Examination:  Physical Exam: GEN: NAD, alert and oriented x 3, wd/wn HEENT: NCAT, PERRL, EOMI, sclera clear, MMM PULM: CTAB w/o wheezes/crackles, normal respiratory effort, on room air CV: RRR w/o M/G/R GI: abd soft, NTND, NABS, no R/G/M MSK: no peripheral edema, muscle strength globally intact 5/5 bilateral upper/lower extremities NEURO: CN II-XII intact, no focal deficits, sensation to light touch intact PSYCH: normal mood/affect Integumentary: dry/intact, no rashes or wounds    Data Reviewed: I have personally reviewed following labs and imaging studies  CBC: Recent Labs  Lab 01/05/23 1111 01/06/23 0256 01/07/23 0924  WBC 9.7 8.5 8.1  NEUTROABS 8.5*  --  6.5  HGB 13.4 12.5* 12.9*  HCT 37.7* 36.3* 36.4*  MCV 92.9 97.6 94.5  PLT 130* 118* 834*   Basic Metabolic Panel: Recent Labs  Lab 01/05/23 1111 01/06/23 0256 01/06/23 1121 01/07/23 0924 01/09/23 0316  NA 136 133* 128* 133* 130*  K 4.0 5.5* 4.5 4.3 4.9  CL 97* 94* 90* 91* 90*  CO2 23 17* 17* 29 23  GLUCOSE 128* 274* 307* 224* 203*  BUN 17 25* 38* 28* 12  CREATININE 0.79 1.50* 1.97* 1.06 1.07  CALCIUM 9.0 9.0 8.7* 8.9 8.8*  MG  --   --   --  2.3  --   PHOS  --   --  4.9* 2.1*  --    GFR: Estimated Creatinine Clearance: 77.8 mL/min  (by C-G formula based on SCr of 1.07 mg/dL). Liver Function Tests: Recent Labs  Lab 01/06/23 1121 01/07/23 0924  ALBUMIN 3.9 3.6   No results for input(s): "LIPASE", "AMYLASE" in the last 168 hours. No results for input(s): "AMMONIA" in the last 168 hours. Coagulation Profile: Recent Labs  Lab 01/09/23 0316  INR 1.1   Cardiac Enzymes: Recent Labs  Lab 01/06/23 1121 01/06/23 1904  CKTOTAL 246 214   BNP (last 3 results) No results for input(s): "PROBNP" in the last 8760 hours. HbA1C: No results for input(s): "HGBA1C" in the last 72 hours.  CBG: Recent Labs  Lab 01/11/23 1150 01/11/23 1555 01/11/23 1917 01/11/23 2155 01/12/23 0754  GLUCAP 228* 107* 192* 205* 198*   Lipid Profile: No results for input(s): "CHOL", "HDL", "LDLCALC", "TRIG", "CHOLHDL", "LDLDIRECT" in the last 72 hours. Thyroid Function Tests: No results for input(s): "TSH", "T4TOTAL", "FREET4", "T3FREE", "THYROIDAB" in the last 72 hours.  Anemia Panel: No results for input(s): "VITAMINB12", "FOLATE", "FERRITIN", "TIBC", "IRON", "RETICCTPCT" in the last 72 hours. Sepsis Labs:  No results for input(s): "PROCALCITON", "LATICACIDVEN" in the last 168 hours.  No results found for this or any previous visit (from the past 240 hour(s)).       Radiology Studies: No results found.      Scheduled Meds:  aspirin EC  81 mg Oral QHS   docusate sodium  100 mg Oral BID   feeding supplement (GLUCERNA SHAKE)  237 mL Oral TID BM   fenofibrate  160 mg Oral Daily   folic acid  1 mg Oral Daily   insulin aspart  0-15 Units Subcutaneous TID WC   insulin aspart  0-5 Units Subcutaneous QHS   insulin aspart protamine- aspart  15 Units Subcutaneous BID WC   lidocaine  3 patch Transdermal Q24H   magnesium oxide  400 mg Oral BID   metoprolol succinate  12.5 mg Oral QHS   multivitamin with minerals  1 tablet Oral Daily   pantoprazole  40 mg Oral Daily   rosuvastatin  10 mg Oral QHS   thiamine  100 mg Oral Daily    Or   thiamine  100 mg Intravenous Daily   Continuous Infusions:     LOS: 7 days    Time spent: 50 minutes spent on chart review, discussion with nursing staff, consultants, updating family and interview/physical exam; more than 50% of that time was spent in counseling and/or coordination of care.    Eva Griffo J [place] DO [facility] Available via Epic secure chat 7am-7pm After these hours, please refer to coverage provider listed on [email or web address] 01/12/2023, 10:04 AM

## 2023-01-13 ENCOUNTER — Other Ambulatory Visit (HOSPITAL_COMMUNITY): Payer: Self-pay

## 2023-01-13 ENCOUNTER — Inpatient Hospital Stay (HOSPITAL_COMMUNITY): Payer: Managed Care, Other (non HMO)

## 2023-01-13 DIAGNOSIS — S32000A Wedge compression fracture of unspecified lumbar vertebra, initial encounter for closed fracture: Secondary | ICD-10-CM | POA: Diagnosis not present

## 2023-01-13 HISTORY — PX: IR KYPHO LUMBAR INC FX REDUCE BONE BX UNI/BIL CANNULATION INC/IMAGING: IMG5519

## 2023-01-13 LAB — CBC WITH DIFFERENTIAL/PLATELET
Abs Immature Granulocytes: 0.02 10*3/uL (ref 0.00–0.07)
Basophils Absolute: 0.1 10*3/uL (ref 0.0–0.1)
Basophils Relative: 1 %
Eosinophils Absolute: 0.2 10*3/uL (ref 0.0–0.5)
Eosinophils Relative: 4 %
HCT: 34.6 % — ABNORMAL LOW (ref 39.0–52.0)
Hemoglobin: 12.5 g/dL — ABNORMAL LOW (ref 13.0–17.0)
Immature Granulocytes: 0 %
Lymphocytes Relative: 27 %
Lymphs Abs: 1.3 10*3/uL (ref 0.7–4.0)
MCH: 33.4 pg (ref 26.0–34.0)
MCHC: 36.1 g/dL — ABNORMAL HIGH (ref 30.0–36.0)
MCV: 92.5 fL (ref 80.0–100.0)
Monocytes Absolute: 0.7 10*3/uL (ref 0.1–1.0)
Monocytes Relative: 14 %
Neutro Abs: 2.6 10*3/uL (ref 1.7–7.7)
Neutrophils Relative %: 54 %
Platelets: 167 10*3/uL (ref 150–400)
RBC: 3.74 MIL/uL — ABNORMAL LOW (ref 4.22–5.81)
RDW: 11.9 % (ref 11.5–15.5)
WBC: 4.9 10*3/uL (ref 4.0–10.5)
nRBC: 0 % (ref 0.0–0.2)

## 2023-01-13 LAB — BASIC METABOLIC PANEL
Anion gap: 8 (ref 5–15)
BUN: 14 mg/dL (ref 8–23)
CO2: 27 mmol/L (ref 22–32)
Calcium: 8.9 mg/dL (ref 8.9–10.3)
Chloride: 97 mmol/L — ABNORMAL LOW (ref 98–111)
Creatinine, Ser: 0.87 mg/dL (ref 0.61–1.24)
GFR, Estimated: >60 mL/min (ref >60)
Glucose, Bld: 200 mg/dL — ABNORMAL HIGH (ref 70–99)
Potassium: 4 mmol/L (ref 3.5–5.1)
Sodium: 132 mmol/L — ABNORMAL LOW (ref 135–145)

## 2023-01-13 LAB — GLUCOSE, CAPILLARY
Glucose-Capillary: 175 mg/dL — ABNORMAL HIGH (ref 70–99)
Glucose-Capillary: 189 mg/dL — ABNORMAL HIGH (ref 70–99)
Glucose-Capillary: 207 mg/dL — ABNORMAL HIGH (ref 70–99)
Glucose-Capillary: 220 mg/dL — ABNORMAL HIGH (ref 70–99)
Glucose-Capillary: 228 mg/dL — ABNORMAL HIGH (ref 70–99)
Glucose-Capillary: 259 mg/dL — ABNORMAL HIGH (ref 70–99)

## 2023-01-13 MED ORDER — MIDAZOLAM HCL 2 MG/2ML IJ SOLN
INTRAMUSCULAR | Status: AC
  Filled 2023-01-13: qty 2

## 2023-01-13 MED ORDER — FENTANYL CITRATE (PF) 100 MCG/2ML IJ SOLN
INTRAMUSCULAR | Status: AC | PRN
  Administered 2023-01-13 (×2): 25 ug via INTRAVENOUS

## 2023-01-13 MED ORDER — MIDAZOLAM HCL 2 MG/2ML IJ SOLN
INTRAMUSCULAR | Status: AC | PRN
  Administered 2023-01-13 (×2): 1 mg via INTRAVENOUS

## 2023-01-13 MED ORDER — BUPIVACAINE HCL (PF) 0.5 % IJ SOLN
INTRAMUSCULAR | Status: AC
  Filled 2023-01-13: qty 30

## 2023-01-13 MED ORDER — LIDOCAINE HCL 1 % IJ SOLN
INTRAMUSCULAR | Status: AC
  Filled 2023-01-13: qty 20

## 2023-01-13 MED ORDER — VANCOMYCIN HCL IN DEXTROSE 1-5 GM/200ML-% IV SOLN
INTRAVENOUS | Status: AC
  Administered 2023-01-13: 1000 mg
  Filled 2023-01-13: qty 200

## 2023-01-13 MED ORDER — TOBRAMYCIN SULFATE 1.2 G IJ SOLR
INTRAMUSCULAR | Status: AC
  Filled 2023-01-13: qty 1.2

## 2023-01-13 MED ORDER — FENTANYL CITRATE (PF) 100 MCG/2ML IJ SOLN
INTRAMUSCULAR | Status: AC
  Filled 2023-01-13: qty 2

## 2023-01-13 NOTE — TOC Initial Note (Deleted)
Transition of Care [facility]) - Initial/Assessment Note    Patient Details  Name: Shawn Daniels MRN: [id number] Date of Birth: 1956-12-04  [facility] CM/SW Contact:    Ninfa Meeker, RN Phone Number: 01/13/2023, 9:50 AM  Clinical Narrative:                  Transition of Care Screening Note: Transition of Care [facility]) Department has reviewed patient and no TOC needs have been identified at this time. We will continue to monitor patient advancement through Interdisciplinary progressions and if new patient needs arise, please place a consult.        Patient Goals and CMS Choice            Expected Discharge Plan and Services                                              Prior Living Arrangements/Services                       Activities of Daily Living      Permission Sought/Granted                  Emotional Assessment              Admission diagnosis:  Lumbar compression fracture, closed, initial encounter ([place]) [S32.000A] Compression fracture of L1 vertebra, initial encounter ([place]) [S32.010A] Patient Active Problem List   Diagnosis Date Noted   Lumbar compression fracture, closed, initial encounter ([place]) 01/05/2023   Alcohol dependence ([place]) 01/05/2023   Sinus tachycardia 10/08/2022   PAD (peripheral artery disease) ([place]) 04/26/2022   Hypertension 04/25/2022   Insulin dependent diabetes mellitus type IA ([place]) 04/25/2022   HLD (hyperlipidemia) 04/03/2022   Claudication in peripheral vascular disease ([place]) 12/31/2021   Extensor tendon disruption 06/19/2020   Traumatic tear of supraspinatus tendon, right, initial encounter 12/02/2018   Rib contusion, right, initial encounter 12/02/2018   Trigger finger, acquired 06/29/2018   Mass of finger of left hand 11/01/2017   Closed fracture of first metacarpal bone of left hand with routine healing, subsequent encounter 09/21/2017   PCP:  Glenda Chroman, MD Pharmacy:    [facility], [address] [place] [address] Phone: [phone] Fax: [phone]     Social Determinants of Health (SDOH) Social History: SDOH Screenings   Tobacco Use: Medium Risk (01/08/2023)   SDOH Interventions:     Readmission Risk Interventions     No data to display

## 2023-01-13 NOTE — Discharge Instructions (Signed)
INR.  1.  No stooping, bending, or lifting weights above 10 pounds for 2 weeks.  2.  No driving for 2 weeks.  3.  Use a walker to ambulate for 2 weeks.  4.  See as needed in  2 weeks.  Arlean Hopping MD

## 2023-01-13 NOTE — Progress Notes (Signed)
Mobility Specialist Progress Note:   01/13/23 1344  Mobility  Activity Refused mobility   Pt refused mobility after Max encouragement d/t awaiting procedure. Will f/u as able.  Andrey Campanile Mobility Specialist Please contact via SecureChat or  Rehab office at [phone]

## 2023-01-13 NOTE — Inpatient Diabetes Management (Signed)
Inpatient Diabetes Program Recommendations  AACE/ADA: New Consensus Statement on Inpatient Glycemic Control (2015)  Target Ranges:  Prepandial:   less than 140 mg/dL      Peak postprandial:   less than 180 mg/dL (1-2 hours)      Critically ill patients:  140 - 180 mg/dL   Lab Results  Component Value Date   GLUCAP 259 (H) 01/13/2023   HGBA1C 6.9 (H) 01/06/2023     Latest Reference Range & Units 01/12/23 07:54 01/12/23 11:49 01/12/23 16:28 01/12/23 22:33 01/13/23 00:33 01/13/23 05:00 01/13/23 07:40  Glucose-Capillary 70 - 99 mg/dL 198 (H) 221 (H) 238 (H) 210 (H) 189 (H) 228 (H) 259 (H)   Diabetes history: DM 1 Outpatient Diabetes medications:  70/30 30 units bid Current orders for Inpatient glycemic control:  70/30 15 units bid  Inpatient Diabetes Program Recommendations:    -  Increase am dose of 70/30 to 20 units/ pm dose remain a 15 units  Thank you, Tama Headings RN, MSN, BC-ADM Inpatient Diabetes Coordinator Team Pager [phone] (8a-5p)

## 2023-01-13 NOTE — Progress Notes (Signed)
Nutrition Follow-up  DOCUMENTATION CODES:  Not applicable  INTERVENTION:  Once diet resumes: Encourage ongoing adequate PO intake Glucerna Shake po TID, each supplement provides 220 kcal and 10 grams of protein MVI with minerals daily Recommend ongoing encouragement of bowel medications  NUTRITION DIAGNOSIS:  Increased nutrient needs related to acute illness as evidenced by estimated needs. - ongoing  GOAL:  Patient will meet greater than or equal to 90% of their needs - goal unmet, addressing via nutrition supplements and meals  MONITOR:  PO intake, Supplement acceptance, Labs, I & O's  REASON FOR ASSESSMENT:  Consult  (Nutritional Goals)  ASSESSMENT:  67 y.o. male presented to the ED after a fall, hitting his low back. PMH includes DM, HTN, HLD, and EtOH abuse. Pt admitted with lumbar compression fracture.  NPO for kyphoplasty today.    Upon entering room, pt reports feelings of frustration this morning remaining NPO for planned procedure and the uncertainty for timing of this procedure. He states that d/t his T1DM he will need to eat a snack shortly if he is unable to go for his procedure soon.   Pt states that his PO intake has remained variable. Per MAR, pt has intermittently been receiving Glucerna shakes but other times refusing d/t it containing "too much sugar." Unable to confirm with pt if and how much he has consumed given ongoing frustrations over procedure timing today.   Meal completions: 1/16: 10% dinner 1/18: 50% lunch 1/21: 75% breakfast, 100% dinner  Medications: colace (given this morning), fenofibrate, folvite, SSI 0-15 units TID, SSI 0-5 units qhs, novolog mix 70/30 15 units BID, mag-ox, MVI, protonix, thiamine  Labs: sodium 132, CBG's 175-259 x24 hours  NUTRITION - FOCUSED PHYSICAL EXAM: Pt frustrated, unable to perform today. Deferred to follow up.   Diet Order:   Diet Order             Diet NPO time specified Except for: Sips with Meds  Diet  effective midnight                   EDUCATION NEEDS:  No education needs have been identified at this time  Skin:  Skin Assessment: Reviewed RN Assessment  Last BM:  1/15- pt noted to be refusing stool softeners, RN continues to encourage pt  Height:  Ht Readings from Last 1 Encounters:  01/05/23 6\' 1"  (1.854 m)    Weight:  Wt Readings from Last 1 Encounters:  01/05/23 93.4 kg    Ideal Body Weight:  83.6 kg  BMI:  Body mass index is 27.18 kg/m.  Estimated Nutritional Needs:   Kcal:  2200-2400  Protein:  110-125 grams  Fluid:  >/= 2 L  Clayborne Dana, RDN, LDN Clinical Nutrition

## 2023-01-13 NOTE — Progress Notes (Signed)
PROGRESS NOTE    Shawn Daniels  CZY:[id number] DOB: 03/01/1956 DOA: 01/05/2023 PCP: Ignatius Specking, MD    Brief Narrative:   Shawn Daniels is a 67 y.o. male with past medical history significant for type 2 diabetes mellitus, essential hypertension, peripheral vascular disease, history of Peyronie's disease s/p penile implant who presented to [facility] ED on 1/14 with complaints of low back pain following a fall at home.  Patient reports that his blood sugar was low and fell onto the fireplace hearth.  He reportedly had to crawl to the bathroom.  Imaging studies in the ED revealed L1 compression fracture.  TRH was consulted for admission for further evaluation management.  Assessment & Plan:    Lumbar compression fracture Patient presenting to ED with low back pain following fall at home.  Pelvis x-ray with no acute fracture or dislocation.  Lumbar spine x-ray with anterior compression fracture L1 with 60% height loss.  MR T/L-spine with compression fracture L1 with approximately 50% height loss and 3 mm retropulsion, no acute abnormality of the thoracic spine. -- IR consulted for kyphoplasty; planned for today, n.p.o. -- Dilaudid 1 mg p.o. every 4 hours as needed moderate pain -- Dilaudid 1 mg IV every 2 hours as needed severe breakthrough pain -- Robaxin 500 mg PO every 6 hours as needed muscle spasms -- Holding Plavix, (need 5 day hold per IR; last dose PM of 1/15) -- PT eval on 1/18, no needs identified on discharge  Acute renal failure: Resolved Creatinine 0.7 on admission, trended up to 1.97 with correlating findings of bladder distention on imaging.  Likely secondary to bladder outlet obstruction from acute pain as above.  Supported with IV fluid hydration. -- Cr 0.79>1.50>1.97>1.07>0.87 -- Hold home lisinopril -- Continue monitor urine output -- BMP in a.m.  Type 2 diabetes mellitus Hemoglobin A1c 6.9 on 01/06/2023, well-controlled.  Patient with hypoglycemic episode prior to  admission, likely secondary to strenuous exercise in the setting of continued insulin use and poor oral intake leading to fall as above.  Home regimen includes NPH 70/3030 units twice daily and Glyxambi 25-5 mg PO daily -- Diabetic educator following, appreciate assistance -- NPH 15u [place] BID -- Moderate SSI for coverage -- CBG before every meal/at bedtime  Essential hypertension -- Metoprolol succinate 12.5 mg p.o. daily -- Holding home lisinopril for now secondary to AKI.  HLD: -- Crestor 10 mg p.o. daily -- continue Fenofibrate  -- Resume Repatha injections q. 14 days following discharge  GERD: Continue PPI  Peripheral vascular disease Follows with vascular surgery outpatient, Dr. Myra Gianotti.  Previously underwent right leg revascularization via femoral endarterectomy and SFA stenting.  Recently underwent left lower extremity aortogram with stent placement to left popliteal artery on 11/19/2022. -- Continue aspirin, statin -- holding plavix as above   DVT prophylaxis: SCDs Start: 01/05/23 1731    Code Status: Full Code Family Communication: No family present at bedside this morning.  Disposition Plan:  Level of care: Med-Surg Status is: Inpatient Remains inpatient appropriate because: Awaiting IR for kyphoplasty planned for tomorrow, n.p.o. after midnight    Consultants:  Interventional radiology  Procedures:  None  Antimicrobials:  None   Subjective: Patient seen examined bedside, resting comfortably.  Lying in bed.  Continues to report pain.  Upset that he has not gone down for the procedure as of yet.  Discussed with him once again that emergencies take precedence and that he will be fitted and throughout the day pending the IR schedule.  States "I am a diabetic and I will need to have some to eat if I do not go for the procedure soon".  No other specific questions or concerns at this time.  Updated RN this morning.  Denies headache, no dizziness, no chest pain, no  palpitations, no shortness of breath, no abdominal pain, no fever/chills/night sweats, no nausea/vomiting/diarrhea, no focal weakness, no fatigue, no paresthesias.  No acute events overnight per nurse staff.   Objective: Vitals:   01/12/23 1627 01/12/23 2050 01/13/23 0504 01/13/23 0740  BP: 139/70 115/76 131/81 (!) 153/70  Pulse: 71 83 78 75  Resp: 17 16 20 17   Temp: 98 F (36.7 C) 98.1 F (36.7 C) 98.2 F (36.8 C) 97.9 F (36.6 C)  TempSrc: Oral Oral  Oral  SpO2: 96% 94% 93% 97%  Weight:      Height:        Intake/Output Summary (Last 24 hours) at 01/13/2023 1058 Last data filed at 01/12/2023 1720 Gross per 24 hour  Intake 400 ml  Output --  Net 400 ml   Filed Weights   01/05/23 0953  Weight: 93.4 kg    Examination:  Physical Exam: GEN: NAD, alert and oriented x 3, wd/wn HEENT: NCAT, PERRL, EOMI, sclera clear, MMM PULM: CTAB w/o wheezes/crackles, normal respiratory effort, on room air CV: RRR w/o M/G/R GI: abd soft, NTND, NABS, no R/G/M MSK: no peripheral edema, muscle strength globally intact 5/5 bilateral upper/lower extremities NEURO: CN II-XII intact, no focal deficits, sensation to light touch intact PSYCH: normal mood/affect Integumentary: dry/intact, no rashes or wounds    Data Reviewed: I have personally reviewed following labs and imaging studies  CBC: Recent Labs  Lab 01/07/23 0924 01/12/23 2330  WBC 8.1 4.9  NEUTROABS 6.5 2.6  HGB 12.9* 12.5*  HCT 36.4* 34.6*  MCV 94.5 92.5  PLT 115* 235   Basic Metabolic Panel: Recent Labs  Lab 01/06/23 1121 01/07/23 0924 01/09/23 0316 01/12/23 2330  NA 128* 133* 130* 132*  K 4.5 4.3 4.9 4.0  CL 90* 91* 90* 97*  CO2 17* 29 23 27   GLUCOSE 307* 224* 203* 200*  BUN 38* 28* 12 14  CREATININE 1.97* 1.06 1.07 0.87  CALCIUM 8.7* 8.9 8.8* 8.9  MG  --  2.3  --   --   PHOS 4.9* 2.1*  --   --    GFR: Estimated Creatinine Clearance: 95.7 mL/min (by C-G formula based on SCr of 0.87 mg/dL). Liver Function  Tests: Recent Labs  Lab 01/06/23 1121 01/07/23 0924  ALBUMIN 3.9 3.6   No results for input(s): "LIPASE", "AMYLASE" in the last 168 hours. No results for input(s): "AMMONIA" in the last 168 hours. Coagulation Profile: Recent Labs  Lab 01/09/23 0316  INR 1.1   Cardiac Enzymes: Recent Labs  Lab 01/06/23 1121 01/06/23 1904  CKTOTAL 246 214   BNP (last 3 results) No results for input(s): "PROBNP" in the last 8760 hours. HbA1C: No results for input(s): "HGBA1C" in the last 72 hours.  CBG: Recent Labs  Lab 01/12/23 1628 01/12/23 2233 01/13/23 0033 01/13/23 0500 01/13/23 0740  GLUCAP 238* 210* 189* 228* 259*   Lipid Profile: No results for input(s): "CHOL", "HDL", "LDLCALC", "TRIG", "CHOLHDL", "LDLDIRECT" in the last 72 hours. Thyroid Function Tests: No results for input(s): "TSH", "T4TOTAL", "FREET4", "T3FREE", "THYROIDAB" in the last 72 hours.  Anemia Panel: No results for input(s): "VITAMINB12", "FOLATE", "FERRITIN", "TIBC", "IRON", "RETICCTPCT" in the last 72 hours. Sepsis Labs: No results for input(s): "PROCALCITON", "  LATICACIDVEN" in the last 168 hours.  No results found for this or any previous visit (from the past 240 hour(s)).       Radiology Studies: No results found.      Scheduled Meds:  aspirin EC  81 mg Oral QHS   docusate sodium  100 mg Oral BID   feeding supplement (GLUCERNA SHAKE)  237 mL Oral TID BM   fenofibrate  160 mg Oral Daily   folic acid  1 mg Oral Daily   insulin aspart  0-15 Units Subcutaneous TID WC   insulin aspart  0-5 Units Subcutaneous QHS   insulin aspart protamine- aspart  15 Units Subcutaneous BID WC   lidocaine  3 patch Transdermal Q24H   magnesium oxide  400 mg Oral BID   metoprolol succinate  12.5 mg Oral QHS   multivitamin with minerals  1 tablet Oral Daily   pantoprazole  40 mg Oral Daily   rosuvastatin  10 mg Oral QHS   thiamine  100 mg Oral Daily   Or   thiamine  100 mg Intravenous Daily   Continuous  Infusions:     LOS: 8 days    Time spent: 50 minutes spent on chart review, discussion with nursing staff, consultants, updating family and interview/physical exam; more than 50% of that time was spent in counseling and/or coordination of care.    Jermani Eberlein J [place] DO [facility] Available via Epic secure chat 7am-7pm After these hours, please refer to coverage provider listed on [email or web address] 01/13/2023, 10:58 AM

## 2023-01-13 NOTE — Procedures (Signed)
INR.  Status post L1 balloon kyphoplasty.  Right transpedicular approach.  No acute complications.  Patient tolerated the procedure well.  Arlean Hopping MD.

## 2023-01-14 DIAGNOSIS — S32000A Wedge compression fracture of unspecified lumbar vertebra, initial encounter for closed fracture: Secondary | ICD-10-CM | POA: Diagnosis not present

## 2023-01-14 LAB — GLUCOSE, CAPILLARY: Glucose-Capillary: 312 mg/dL — ABNORMAL HIGH (ref 70–99)

## 2023-01-14 MED ORDER — CLOPIDOGREL BISULFATE 75 MG PO TABS: 75.0000 mg | ORAL_TABLET | Freq: Every day | ORAL | 2 refills | Status: DC

## 2023-01-14 MED ORDER — HYDROMORPHONE HCL 2 MG PO TABS: 1.0000 mg | ORAL_TABLET | Freq: Four times a day (QID) | ORAL | 0 refills | Status: DC | PRN

## 2023-01-14 MED ORDER — METHOCARBAMOL 500 MG PO TABS: 500.0000 mg | ORAL_TABLET | Freq: Four times a day (QID) | ORAL | 0 refills | Status: DC | PRN

## 2023-01-14 MED ORDER — ASPIRIN 81 MG PO TBEC: 81.0000 mg | DELAYED_RELEASE_TABLET | Freq: Every evening | ORAL | 12 refills | Status: AC

## 2023-01-14 NOTE — Discharge Summary (Signed)
Physician Discharge Summary  Shawn Daniels FHL:[id number] DOB: 1956/04/06 DOA: 01/05/2023  PCP: Glenda Chroman, MD  Admit date: 01/05/2023 Discharge date: 01/14/2023  Admitted From: Home Disposition: Home  Recommendations for Outpatient Follow-up:  Follow up with PCP in 1-2 weeks Follow-up with interventional radiology, Dr. Estanislado Pandy as needed in 2 weeks Recommendations following kyphoplasty per IR, no stooping, bending, lifting weights above 10 pounds for 2 weeks, no driving for 2 weeks, use a walker for ambulation for 2 weeks,  Home Health: None Equipment/Devices: Cane as requested by patient, states he will obtain a walker from "[employer]"  Discharge Condition: Stable CODE STATUS: Full code Diet recommendation: Heart healthy/consistent carbohydrate diet  History of present illness:  Shawn Daniels is a 67 y.o. male with past medical history significant for type 2 diabetes mellitus, essential hypertension, peripheral vascular disease, history of Peyronie's disease s/p penile implant who presented to [facility] ED on 1/14 with complaints of low back pain following a fall at home.  Patient reports that his blood sugar was low and fell onto the fireplace hearth.  He reportedly had to crawl to the bathroom.  Imaging studies in the ED revealed L1 compression fracture.  TRH was consulted for admission for further evaluation management.   Hospital course:  Lumbar compression fracture Patient presenting to ED with low back pain following fall at home.  Pelvis x-ray with no acute fracture or dislocation.  Lumbar spine x-ray with anterior compression fracture L1 with 60% height loss.  MR T/L-spine with compression fracture L1 with approximately 50% height loss and 3 mm retropulsion, no acute abnormality of the thoracic spine.  Interventional radiology was consulted and patient underwent kyphoplasty on 01/13/2023.  Continue Dilaudid 1 mg p.o. every 6 hours as needed for moderate pain and Robaxin as  needed for muscle spasms.  May resume Plavix and aspirin on discharge.  Seen by PT with no home health needs identified.  Interventional radiology recommends no stooping, bending, lifting weights above 10 pounds for 2 weeks, no driving for 2 weeks, use a walker to ambulate for 2 weeks and outpatient follow-up in 2 weeks as needed.   Acute renal failure: Resolved Creatinine 0.7 on admission, trended up to 1.97 with correlating findings of bladder distention on imaging.  Likely secondary to bladder outlet obstruction from acute pain as above.  Supported with IV fluid hydration.  Creatinine proved to 0.87 at time of discharge.  May resume lisinopril on discharge.   Type 2 diabetes mellitus Hemoglobin A1c 6.9 on 01/06/2023, well-controlled.  Patient with hypoglycemic episode prior to admission, likely secondary to strenuous exercise in the setting of continued insulin use and poor oral intake leading to fall as above.  Home regimen includes NPH 70/3030 units twice daily and Glyxambi 25-5 mg PO daily.  Resume home regimen on discharge, follow-up with PCP.  Essential hypertension Continue metoprolol succinate and lisinopril   HLD: Continue Crestor, fenofibrate, Repatha.   GERD: Continue PPI   Peripheral vascular disease Follows with vascular surgery outpatient, Dr. Trula Slade.  Previously underwent right leg revascularization via femoral endarterectomy and SFA stenting.  Recently underwent left lower extremity aortogram with stent placement to left popliteal artery on 11/19/2022. Continue aspirin, Plavix, statin   Discharge Diagnoses:  Principal Problem:   Lumbar compression fracture, closed, initial encounter ([place]) Active Problems:   HLD (hyperlipidemia)   Hypertension   Insulin dependent diabetes mellitus type IA ([place])   PAD (peripheral artery disease) ([place])   Alcohol dependence ([place])    Discharge  Instructions  Discharge Instructions     Call MD for:  difficulty breathing, headache or  visual disturbances   Complete by: As directed    Call MD for:  extreme fatigue   Complete by: As directed    Call MD for:  persistant dizziness or light-headedness   Complete by: As directed    Call MD for:  persistant nausea and vomiting   Complete by: As directed    Call MD for:  severe uncontrolled pain   Complete by: As directed    Call MD for:  temperature >100.4   Complete by: As directed    Diet - low sodium heart healthy   Complete by: As directed    Increase activity slowly   Complete by: As directed       Allergies as of 01/14/2023       Reactions   Codeine Itching, Other (See Comments)   Can take/tolerate in lower doses   Keflex [cephalexin] Other (See Comments)   "Shriveled my skin"   Penicillins Other (See Comments)   Reaction from childhood not recalled        Medication List     TAKE these medications    Accu-Chek Guide test strip Generic drug: glucose blood 1 each by Other route 2 (two) times daily.   aspirin EC 81 MG tablet Take 1 tablet (81 mg total) by mouth at bedtime. Start taking on: January 15, 2023   clopidogrel 75 MG tablet Commonly known as: PLAVIX Take 1 tablet (75 mg total) by mouth daily. Start taking on: January 15, 2023 What changed: when to take this   fenofibrate 145 MG tablet Commonly known as: Tricor Take 1 tablet (145 mg total) by mouth daily.   Glyxambi 25-5 MG Tabs Generic drug: Empagliflozin-linaGLIPtin Take 1 tablet by mouth in the morning.   HYDROmorphone 2 MG tablet Commonly known as: DILAUDID Take 0.5 tablets (1 mg total) by mouth every 6 (six) hours as needed for moderate pain.   lisinopril 10 MG tablet Commonly known as: ZESTRIL Take 10 mg by mouth in the morning and at bedtime.   Magnesium 400 MG Caps Take 400 mg by mouth in the morning and at bedtime.   methocarbamol 500 MG tablet Commonly known as: ROBAXIN Take 1 tablet (500 mg total) by mouth every 6 (six) hours as needed for muscle spasms.    metoprolol succinate 25 MG 24 hr tablet Commonly known as: Toprol XL Take 1 tablet (25 mg total) by mouth daily. What changed:  how much to take when to take this   multivitamin with minerals tablet Take 1 tablet by mouth daily.   nitroGLYCERIN 0.2 mg/hr patch Commonly known as: NITRODUR - Dosed in mg/24 hr APPLY 1/4 (ONE-FOURTH) PATCH TO AFFECTED ELBOW, CHANGE DAILY What changed:  how much to take how to take this when to take this reasons to take this additional instructions   NovoLIN 70/30 ReliOn (70-30) 100 UNIT/ML injection Generic drug: insulin NPH-regular Human Inject 30 Units into the skin See admin instructions. Inject 30 units into the skin 2 times a day with meals, per sliding scale   omeprazole 20 MG capsule Commonly known as: PRILOSEC Take 20 mg by mouth daily before breakfast.   ReliOn Insulin Syringe 31G X 15/64" 0.5 ML Misc Generic drug: Insulin Syringe-Needle U-100 USE 1 THREE TIMES DAILY   Repatha SureClick 140 MG/ML Soaj Generic drug: Evolocumab Inject 1 pen. into the skin every 14 (fourteen) days.   rosuvastatin 10 MG  tablet Commonly known as: CRESTOR Take 10 mg by mouth at bedtime.               Durable Medical Equipment  (From admission, onward)           Start     Ordered   01/14/23 0901  DME Cane  Once        01/14/23 [id number]   01/11/23 0728  For home use only DME 3 n 1  Once        01/11/23 0727   01/11/23 0728  For home use only DME Bedside commode  Once       Question:  Patient needs a bedside commode to treat with the following condition  Answer:  Gait abnormality   01/11/23 [id number]            Follow-up Information     Ignatius SpeckingVyas, Dhruv B, MD. Schedule an appointment as soon as possible for a visit in 1 week(s).   Specialty: Internal Medicine Contact information: [address] [place] [address] [phone]         Julieanne Cottoneveshwar, Sanjeev, MD Follow up.   Specialties: Interventional Radiology, Radiology Why: As  needed Contact information: [address] [place] [address] [phone]                Allergies  Allergen Reactions   Codeine Itching and Other (See Comments)    Can take/tolerate in lower doses   Keflex [Cephalexin] Other (See Comments)    "Shriveled my skin"   Penicillins Other (See Comments)    Reaction from childhood not recalled    Consultations: Interventional Radiology   Procedures/Studies: US RENAL  Result Date: 01/06/2023 CLINICAL DATA:  Acute kidney injury. EXAM: RENAL / URINARY TRACT ULTRASOUND COMPLETE COMPARISON:  None Available. FINDINGS: Right Kidney: Renal measurements: 11.1 cm x 6.0 cm x 5.6 cm = volume: 193 mL. Echogenicity within normal limits. No mass or hydronephrosis visualized. Left Kidney: Renal measurements: 11.6 cm x 6.2 cm x 5.6 cm = volume: 211 mL. Echogenicity within normal limits. No mass or hydronephrosis visualized. Bladder: Appears normal for degree of bladder distention. Other: None. IMPRESSION: Normal renal ultrasound. Electronically Signed   By: Aram Candelahaddeus  Houston M.D.   On: 01/06/2023 20:21   MR THORACIC SPINE WO CONTRAST  Result Date: 01/06/2023 CLINICAL DATA:  Compression fracture EXAM: MRI THORACIC AND LUMBAR SPINE WITHOUT CONTRAST TECHNIQUE: Multiplanar and multiecho pulse sequences of the thoracic and lumbar spine were obtained without intravenous contrast. COMPARISON:  None Available. FINDINGS: MRI THORACIC SPINE FINDINGS Alignment:  Normal Vertebrae: Chronic compression deformity of T7. No acute osseous abnormality of the thoracic spine Cord:  Normal Paraspinal and other soft tissues: Bilateral dependent atelectasis. Disc levels: No spinal canal stenosis MRI LUMBAR SPINE FINDINGS Segmentation:  Standard. Alignment:  Physiologic. Vertebrae: Compression fracture of L1 with approximately 50% height loss and 3 mm of retropulsion. Moderate diffuse bone marrow edema, extending into both pedicles. Conus medullaris and cauda  equina: Conus extends to the L1 level. Conus and cauda equina appear normal. Paraspinal and other soft tissues: Distended urinary bladder Disc levels: T11-12: Normal. T12-L1: Normal. L1-L2: L1 retropulsion mildly narrows the subarticular recess. No spinal canal stenosis. No neural foraminal stenosis. L2-L3: Normal disc space and facet joints. No spinal canal stenosis. No neural foraminal stenosis. L3-L4: Small central disc protrusion. No spinal canal stenosis. No neural foraminal stenosis. L4-L5: Small central disc protrusion. Narrowing of both lateral recesses without central spinal canal stenosis. No  neural foraminal stenosis. L5-S1: Normal disc space and facet joints. No spinal canal stenosis. No neural foraminal stenosis. Visualized sacrum: Normal. IMPRESSION: 1. Recent compression fracture of L1 with approximately 50% height loss and 3 mm of retropulsion. 2. No acute abnormality of the thoracic spine. 3. Distended urinary bladder. Correlate for urinary retention. Electronically Signed   By: Ulyses Jarred M.D.   On: 01/06/2023 19:57   MR LUMBAR SPINE WO CONTRAST  Result Date: 01/06/2023 CLINICAL DATA:  Compression fracture EXAM: MRI THORACIC AND LUMBAR SPINE WITHOUT CONTRAST TECHNIQUE: Multiplanar and multiecho pulse sequences of the thoracic and lumbar spine were obtained without intravenous contrast. COMPARISON:  None Available. FINDINGS: MRI THORACIC SPINE FINDINGS Alignment:  Normal Vertebrae: Chronic compression deformity of T7. No acute osseous abnormality of the thoracic spine Cord:  Normal Paraspinal and other soft tissues: Bilateral dependent atelectasis. Disc levels: No spinal canal stenosis MRI LUMBAR SPINE FINDINGS Segmentation:  Standard. Alignment:  Physiologic. Vertebrae: Compression fracture of L1 with approximately 50% height loss and 3 mm of retropulsion. Moderate diffuse bone marrow edema, extending into both pedicles. Conus medullaris and cauda equina: Conus extends to the L1 level. Conus  and cauda equina appear normal. Paraspinal and other soft tissues: Distended urinary bladder Disc levels: T11-12: Normal. T12-L1: Normal. L1-L2: L1 retropulsion mildly narrows the subarticular recess. No spinal canal stenosis. No neural foraminal stenosis. L2-L3: Normal disc space and facet joints. No spinal canal stenosis. No neural foraminal stenosis. L3-L4: Small central disc protrusion. No spinal canal stenosis. No neural foraminal stenosis. L4-L5: Small central disc protrusion. Narrowing of both lateral recesses without central spinal canal stenosis. No neural foraminal stenosis. L5-S1: Normal disc space and facet joints. No spinal canal stenosis. No neural foraminal stenosis. Visualized sacrum: Normal. IMPRESSION: 1. Recent compression fracture of L1 with approximately 50% height loss and 3 mm of retropulsion. 2. No acute abnormality of the thoracic spine. 3. Distended urinary bladder. Correlate for urinary retention. Electronically Signed   By: Ulyses Jarred M.D.   On: 01/06/2023 19:57   DG Pelvis 1-2 Views  Result Date: 01/05/2023 CLINICAL DATA:  67 year old male status post fall with pain. EXAM: PELVIS - 1-2 VIEW COMPARISON:  CT Abdomen and Pelvis 06/01/2017. FINDINGS: AP view the pelvis 1050 hours. Extensive Aortoiliac calcified atherosclerosis. Femoral heads are normally located. Pelvis appears stable and intact. Grossly intact proximal femurs. Negative lower abdominal and pelvic visceral contours. A penile implant may be partially visible now. IMPRESSION: 1.  No acute fracture or dislocation identified about the pelvis. 2.  Aortic Atherosclerosis (ICD10-I70.0). Electronically Signed   By: Genevie Ann M.D.   On: 01/05/2023 11:10   DG Chest 2 View  Result Date: 01/05/2023 CLINICAL DATA:  67 year old male with fall and back pain. EXAM: CHEST - 2 VIEW COMPARISON:  Cardiac CT 07/27/2021. FINDINGS: AP and lateral views of the chest at 1056 hours. Lower lung volumes. Mediastinal contours remain normal.  Visualized tracheal air column is within normal limits. No pneumothorax, pleural effusion, pulmonary edema or confluent lung opacity. Chronic right posterior 7th rib fracture was present on the CT last year. No acute osseous abnormality identified. Paucity of bowel gas in the visible abdomen. IMPRESSION: No acute cardiopulmonary abnormality or acute traumatic injury identified. Electronically Signed   By: Genevie Ann M.D.   On: 01/05/2023 11:08   DG Lumbar Spine Complete  Result Date: 01/05/2023 CLINICAL DATA:  Back pain.  Fall. EXAM: LUMBAR SPINE - COMPLETE 4+ VIEW COMPARISON:  CT renal stone protocol 06/01/2017 FINDINGS: Anterior compression  fracture is present at L1 with 60% loss of height. No definite retropulsed bone is present. Vertebral body heights are otherwise maintained. Alignment is anatomic. Mild straightening of the normal cervical lordosis is present. Atherosclerotic calcifications are present within the aorta without evidence for aneurysm. IMPRESSION: 1. Anterior compression fracture at L1 with 60% loss of height. This was not present in 2018 and may be acute. 2. Aortic atherosclerosis. Electronically Signed   By: Marin Roberts M.D.   On: 01/05/2023 11:06   VAS [place] LOWER EXTREMITY ARTERIAL DUPLEX  Result Date: 12/30/2022 LOWER EXTREMITY ARTERIAL DUPLEX STUDY Patient Name:  DONOVIN KRAEMER  Date of Exam:   12/30/2022 Medical Rec #: [id number]           Accession #:    [id number] Date of Birth: Aug 24, 1956          Patient Gender: M Patient Age:   17 years Exam Location:  [facility] Procedure:      VAS [place] LOWER EXTREMITY ARTERIAL DUPLEX Referring Phys: Coral Else --------------------------------------------------------------------------------  Indications: Peripheral artery disease. High Risk Factors: Hypertension, hyperlipidemia, Diabetes, past history of                    smoking.  Vascular Interventions: 11/19/22 Left Popliteal stent                          08/20/22-Right popliteal stent                         04/26/22 RT FA endarterectomy by Dr Myra Gianotti. Current ABI:            R 1.09 L 0.94 Comparison Study: 10/21/22 Prior LT LE arterial duplex                   09/23/22 Prior ABI Performing Technologist: Lowell Guitar RVT, RDMS  Examination Guidelines: A complete evaluation includes B-mode imaging, spectral Doppler, color Doppler, and power Doppler as needed of all accessible portions of each vessel. Bilateral testing is considered an integral part of a complete examination. Limited examinations for reoccurring indications may be performed as noted.   +----------+--------+-----+---------------+---------+--------+ LEFT      PSV cm/sRatioStenosis       Waveform Comments +----------+--------+-----+---------------+---------+--------+ CFA [id number]                         triphasic         +----------+--------+-----+---------------+---------+--------+ DFA       138                         biphasic          +----------+--------+-----+---------------+---------+--------+ SFA Prox  236          50-74% stenosistriphasic         +----------+--------+-----+---------------+---------+--------+ SFA Mid   119                         triphasic         +----------+--------+-----+---------------+---------+--------+ SFA Distal135                         triphasic         +----------+--------+-----+---------------+---------+--------+ POP Prox  101  biphasic          +----------+--------+-----+---------------+---------+--------+ TP Trunk  74                          triphasic         +----------+--------+-----+---------------+---------+--------+  Left Stent(s): +---------------------+--------+--------+---------+--------+ Left Popliteal arteryPSV cm/sStenosisWaveform Comments +---------------------+--------+--------+---------+--------+ Prox to Stent        101             biphasic           +---------------------+--------+--------+---------+--------+ Proximal Stent       89              triphasic         +---------------------+--------+--------+---------+--------+ Mid Stent            102             triphasic         +---------------------+--------+--------+---------+--------+ Distal Stent         72              biphasic          +---------------------+--------+--------+---------+--------+ Distal to Stent      74              triphasic         +---------------------+--------+--------+---------+--------+    Summary: Left: Patent stent with no evidence of stenosis in the Left Popliteal artery.  See table(s) above for measurements and observations. Electronically signed by Coral ElseVance Brabham MD on 12/30/2022 at 6:49:59 PM.    Final    VAS US ABI WITH/WO TBI  Result Date: 12/30/2022  LOWER EXTREMITY DOPPLER STUDY Patient Name:  Alcide Evenerimothy A Nissley  Date of Exam:   12/30/2022 Medical Rec #: [id number]           Accession #:    [id number] Date of Birth: 07/19/1956          Patient Gender: M Patient Age:   [age] years [facility] Procedure:      VAS US ABI WITH/WO TBI Referring Phys: Coral ElseVANCE BRABHAM --------------------------------------------------------------------------------  Indications: Peripheral artery disease. High Risk         Hypertension, hyperlipidemia, Diabetes, past history of Factors:          smoking.  Vascular Interventions: 11/19/22 Left Popliteal stent                         08/20/22-Right popliteal stent                         04/26/22 RT FA endarterectomy by Dr Myra GianottiBrabham. Comparison Study: Prior ABI 09/23/22                   10/21/22 Prior Left LE Arterial duplex. Performing Technologist: Lowell GuitarJohn Varner RVT, RDMS  Examination Guidelines: A complete evaluation includes at minimum, Doppler waveform signals and systolic blood pressure reading at the level of bilateral brachial, anterior tibial, and posterior tibial arteries, when vessel segments are  accessible. Bilateral testing is considered an integral part of a complete examination. Photoelectric Plethysmograph (PPG) waveforms and toe systolic pressure readings are included as required and additional duplex testing as needed. Limited examinations for reoccurring indications may be performed as noted.  ABI Findings: +---------+------------------+-----+-----------+--------+ Right    Rt Pressure (mmHg)IndexWaveform   Comment  +---------+------------------+-----+-----------+--------+ Brachial 126                                        +---------+------------------+-----+-----------+--------+  PTA      137               1.09 multiphasic         +---------+------------------+-----+-----------+--------+ DP       125               0.99 multiphasic         +---------+------------------+-----+-----------+--------+ Great Toe75                0.60 Normal              +---------+------------------+-----+-----------+--------+ +---------+------------------+-----+-----------+-------+ Left     Lt Pressure (mmHg)IndexWaveform   Comment +---------+------------------+-----+-----------+-------+ Brachial 109                                       +---------+------------------+-----+-----------+-------+ PTA      119               0.94 multiphasic        +---------+------------------+-----+-----------+-------+ DP       118               0.94 multiphasic        +---------+------------------+-----+-----------+-------+ Great Toe60                0.48 Normal             +---------+------------------+-----+-----------+-------+ +-------+-----------+-----------+------------+------------+ ABI/TBIToday's ABIToday's TBIPrevious ABIPrevious TBI +-------+-----------+-----------+------------+------------+ Right  1.09       0.60       1.06        0.62         +-------+-----------+-----------+------------+------------+ Left   0.94       0.48       0.74        0.57          +-------+-----------+-----------+------------+------------+  Arterial wall calcification precludes accurate ankle pressures and ABIs. Right ABIs and TBIs appear essentially unchanged. Left ABIs appear increased. Left TBI is decreased.  Summary: Right: Resting right ankle-brachial index is within normal range. The right toe-brachial index is abnormal. Left: Resting left ankle-brachial index indicates mild left lower extremity arterial disease. The left toe-brachial index is abnormal. Left ABI is borderline mild 0.94<0.95. *See table(s) above for measurements and observations.  Electronically signed by Coral Else MD on 12/30/2022 at 6:45:10 PM.    Final      Subjective: Patient seen examined bedside, resting calmly.  Sitting at edge of bed.  Angry that it took so long yesterday to have his kyphoplasty done.  "I should have been done much earlier I am a insulin-dependent diabetic and need to eat".  Discussed with him that I am multiple other individuals discussed with him regarding the radiology schedule and that he is case would be fit and surrounding the emergent cases.  Ready for discharge home.  Requesting cane and that he will obtain a walker from "[employer]".  No other complaints or concerns at this time.  Denies headache, no dizziness, no chest pain, no palpitations, no shortness of breath, no abdominal pain, no fever/chills/night sweats, no nausea/vomiting/diarrhea, no focal weakness, no fatigue, no paresthesias.  No acute events overnight per nursing staff.  Discharge Exam: Vitals:   01/14/23 0509 01/14/23 0858  BP: 132/68 126/65  Pulse: 66 76  Resp:  17  Temp: 98.6 F (37 C)   SpO2: 95% 93%   Vitals:   01/13/23 1700 01/13/23 2100 01/14/23 0509 01/14/23 4650  BP: 111/71 121/62 132/68 126/65  Pulse: 75 76 66 76  Resp: 18   17  Temp: 97.6 F (36.4 C) 98 F (36.7 C) 98.6 F (37 C)   TempSrc: Oral     SpO2: 90% 92% 95% 93%  Weight:      Height:        Physical Exam: GEN: NAD, alert  and oriented x 3, wd/wn HEENT: NCAT, PERRL, EOMI, sclera clear, MMM PULM: CTAB w/o wheezes/crackles, normal respiratory effort, on room air CV: RRR w/o M/G/R GI: abd soft, NTND, NABS, no R/G/M MSK: no peripheral edema, muscle strength globally intact 5/5 bilateral upper/lower extremities NEURO: CN II-XII intact, no focal deficits, sensation to light touch intact PSYCH: normal mood/affect Integumentary: dry/intact, no rashes or wounds    The results of significant diagnostics from this hospitalization (including imaging, microbiology, ancillary and laboratory) are listed below for reference.     Microbiology: No results found for this or any previous visit (from the past 240 hour(s)).   Labs: BNP (last 3 results) No results for input(s): "BNP" in the last 8760 hours. Basic Metabolic Panel: Recent Labs  Lab 01/07/23 0924 01/09/23 0316 01/12/23 2330  NA 133* 130* 132*  K 4.3 4.9 4.0  CL 91* 90* 97*  CO2 29 23 27   GLUCOSE 224* 203* 200*  BUN 28* 12 14  CREATININE 1.06 1.07 0.87  CALCIUM 8.9 8.8* 8.9  MG 2.3  --   --   PHOS 2.1*  --   --    Liver Function Tests: Recent Labs  Lab 01/07/23 0924  ALBUMIN 3.6   No results for input(s): "LIPASE", "AMYLASE" in the last 168 hours. No results for input(s): "AMMONIA" in the last 168 hours. CBC: Recent Labs  Lab 01/07/23 0924 01/12/23 2330  WBC 8.1 4.9  NEUTROABS 6.5 2.6  HGB 12.9* 12.5*  HCT 36.4* 34.6*  MCV 94.5 92.5  PLT 115* 167   Cardiac Enzymes: No results for input(s): "CKTOTAL", "CKMB", "CKMBINDEX", "TROPONINI" in the last 168 hours. BNP: Invalid input(s): "POCBNP" CBG: Recent Labs  Lab 01/13/23 0740 01/13/23 1147 01/13/23 1734 01/13/23 2059 01/14/23 0824  GLUCAP 259* 175* 220* 207* 312*   D-Dimer No results for input(s): "DDIMER" in the last 72 hours. Hgb A1c No results for input(s): "HGBA1C" in the last 72 hours. Lipid Profile No results for input(s): "CHOL", "HDL", "LDLCALC", "TRIG", "CHOLHDL",  "LDLDIRECT" in the last 72 hours. Thyroid function studies No results for input(s): "TSH", "T4TOTAL", "T3FREE", "THYROIDAB" in the last 72 hours.  Invalid input(s): "FREET3" Anemia work up No results for input(s): "VITAMINB12", "FOLATE", "FERRITIN", "TIBC", "IRON", "RETICCTPCT" in the last 72 hours. Urinalysis    Component Value Date/Time   COLORURINE YELLOW 01/06/2023 1304   APPEARANCEUR CLEAR 01/06/2023 1304   LABSPEC 1.021 01/06/2023 1304   PHURINE 5.0 01/06/2023 1304   GLUCOSEU >=500 (A) 01/06/2023 1304   HGBUR NEGATIVE 01/06/2023 1304   BILIRUBINUR NEGATIVE 01/06/2023 1304   KETONESUR 80 (A) 01/06/2023 1304   PROTEINUR NEGATIVE 01/06/2023 1304   NITRITE NEGATIVE 01/06/2023 1304   LEUKOCYTESUR NEGATIVE 01/06/2023 1304   Sepsis Labs Recent Labs  Lab 01/07/23 0924 01/12/23 2330  WBC 8.1 4.9   Microbiology No results found for this or any previous visit (from the past 240 hour(s)).   Time coordinating discharge: Over 30 minutes  SIGNED:   Alvira PhilipsEric J [place], DO  [facility] 01/14/2023, 9:02 AM

## 2023-01-14 NOTE — Plan of Care (Signed)
  Problem: Education: Goal: Knowledge of General Education information will improve Description: Including pain rating scale, medication(s)/side effects and non-pharmacologic comfort measures Outcome: Adequate for Discharge   Problem: Health Behavior/Discharge Planning: Goal: Ability to manage health-related needs will improve Outcome: Adequate for Discharge   Problem: Clinical Measurements: Goal: Ability to maintain clinical measurements within normal limits will improve Outcome: Adequate for Discharge Goal: Will remain free from infection Outcome: Adequate for Discharge Goal: Diagnostic test results will improve Outcome: Adequate for Discharge Goal: Respiratory complications will improve Outcome: Adequate for Discharge Goal: Cardiovascular complication will be avoided Outcome: Adequate for Discharge   Problem: Activity: Goal: Risk for activity intolerance will decrease Outcome: Adequate for Discharge   Problem: Nutrition: Goal: Adequate nutrition will be maintained Outcome: Adequate for Discharge   Problem: Coping: Goal: Level of anxiety will decrease Outcome: Adequate for Discharge   Problem: Elimination: Goal: Will not experience complications related to bowel motility Outcome: Adequate for Discharge Goal: Will not experience complications related to urinary retention Outcome: Adequate for Discharge   Problem: Pain Managment: Goal: General experience of comfort will improve Outcome: Adequate for Discharge   Problem: Safety: Goal: Ability to remain free from injury will improve Outcome: Adequate for Discharge   Problem: Skin Integrity: Goal: Risk for impaired skin integrity will decrease Outcome: Adequate for Discharge   Problem: Increased Nutrient Needs (NI-5.1) Goal: Food and/or nutrient delivery Description: Individualized approach for food/nutrient provision. Outcome: Adequate for Discharge   Problem: Acute Rehab PT Goals(only PT should resolve) Goal:  Pt Will Go Supine/Side To Sit Outcome: Adequate for Discharge Goal: Pt Will Go Sit To Supine/Side Outcome: Adequate for Discharge Goal: Patient Will Transfer Sit To/From Stand Outcome: Adequate for Discharge Goal: Pt Will Transfer Bed To Chair/Chair To Bed Outcome: Adequate for Discharge Goal: Pt Will Ambulate Outcome: Adequate for Discharge Goal: Pt Will Go Up/Down Stairs Outcome: Adequate for Discharge

## 2023-01-14 NOTE — Progress Notes (Signed)
Shawn Daniels patient is confined to a room with no bathroom and cannot ambulate to a bathroom therefore needs 3 in1

## 2023-01-14 NOTE — TOC Initial Note (Addendum)
Transition of Care [facility]) - Initial/Assessment Note    Patient Details  Name: Shawn Daniels MRN: [id number] Date of Birth: 26-Mar-1956  Transition of Care [facility]) CM/SW Contact:    Marilu Favre, RN Phone Number: 01/14/2023, 10:37 AM  Clinical Narrative:                  Spoke to patient at bedside. Patient has a walker at home. Orders for cane and 3 in1 . No preference. NCM explained to patient NCM will order DME with [facility]. [employer] will  discuss cost / coverage directly with patient . Patient voiced understanding.    Erasmo Downer with [employer] requesting narrative from MD for insurance to cover . NCM secure chatted MD.     Expected Discharge Plan: Home/Self Care Barriers to Discharge: No Barriers Identified   Patient Goals and CMS Choice Patient states their goals for this hospitalization and ongoing recovery are:: to return tohome CMS Medicare.gov Compare Post Acute Care list provided to:: Patient Choice offered to / list presented to : Patient      Expected Discharge Plan and Services   Discharge Planning Services: CM Consult Post Acute Care Choice: Durable Medical Equipment Living arrangements for the past 2 months: Single Family Home Expected Discharge Date: 01/14/23               DME Arranged: Ron Agee DME Agency: [employer] Date DME Agency Contacted: 01/14/23 Time DME Agency Contacted: [phone] Representative spoke with at DME Agency: [place]: NA          Prior Living Arrangements/Services Living arrangements for the past 2 months: [place] Lives with:: Self Patient language and need for interpreter reviewed:: Yes Do you feel safe going back to the place where you live?: Yes      Need for Family Participation in Patient Care: Yes (Comment) Care giver support system in place?: Yes (comment) Current home services: DME Criminal Activity/Legal Involvement Pertinent to Current Situation/Hospitalization: No - Comment as  needed  Activities of Daily Living      Permission Sought/Granted   Permission granted to share information with : No              Emotional Assessment Appearance:: Appears stated age Attitude/Demeanor/Rapport: Engaged Affect (typically observed): Accepting Orientation: : Oriented to Self, Oriented to Place, Oriented to  Time, Oriented to Situation Alcohol / Substance Use: Not Applicable Psych Involvement: No (comment)  Admission diagnosis:  Lumbar compression fracture, closed, initial encounter ([place]) [S32.000A] Compression fracture of L1 vertebra, initial encounter ([place]) [S32.010A] Patient Active Problem List   Diagnosis Date Noted   Lumbar compression fracture, closed, initial encounter ([place]) 01/05/2023   Alcohol dependence ([place]) 01/05/2023   Sinus tachycardia 10/08/2022   PAD (peripheral artery disease) ([place]) 04/26/2022   Hypertension 04/25/2022   Insulin dependent diabetes mellitus type IA ([place]) 04/25/2022   HLD (hyperlipidemia) 04/03/2022   Claudication in peripheral vascular disease ([place]) 12/31/2021   Extensor tendon disruption 06/19/2020   Traumatic tear of supraspinatus tendon, right, initial encounter 12/02/2018   Rib contusion, right, initial encounter 12/02/2018   Trigger finger, acquired 06/29/2018   Mass of finger of left hand 11/01/2017   Closed fracture of first metacarpal bone of left hand with routine healing, subsequent encounter 09/21/2017   PCP:  Glenda Chroman, MD Pharmacy:   [facility] [address] [place] [address] Phone: [phone] Fax: [phone]  Social Determinants of Health (SDOH) Social History: SDOH Screenings   Tobacco Use: Medium Risk (01/08/2023)   SDOH Interventions:     Readmission Risk Interventions     No data to display

## 2023-01-14 NOTE — Progress Notes (Signed)
Discharge instructions given. Patient verbalized understanding and all questions were answered.  ?

## 2023-01-20 ENCOUNTER — Other Ambulatory Visit (HOSPITAL_COMMUNITY): Payer: Self-pay | Admitting: Interventional Radiology

## 2023-01-20 DIAGNOSIS — S32010A Wedge compression fracture of first lumbar vertebra, initial encounter for closed fracture: Secondary | ICD-10-CM

## 2023-01-22 ENCOUNTER — Telehealth (HOSPITAL_COMMUNITY): Payer: Self-pay

## 2023-01-22 ENCOUNTER — Ambulatory Visit: Payer: Managed Care, Other (non HMO) | Admitting: Cardiovascular Disease

## 2023-01-22 NOTE — Telephone Encounter (Signed)
-----  Message from Theresa Duty, NP sent at 01/20/2023  1:35 PM EST ----- Regarding: RE: pain Yes, I think a follow up consult would be warranted.  Roselyn Reef ----- Message ----- From: Chad Cordial Sent: 01/20/2023   1:34 PM EST To: Theresa Duty, NP Subject: pain                                           Roselyn Reef,   Mr. Westberg called. He had a kyphoplasty done by Dev on 1/22. He says that he is having lots of hip pain following the procedure and he is not sure what to do. Do I need to get him in for a f/u consult?   Thanks,  Lia Foyer

## 2023-01-27 ENCOUNTER — Ambulatory Visit (HOSPITAL_COMMUNITY): Admission: RE | Admit: 2023-01-27 | Payer: Managed Care, Other (non HMO) | Source: Ambulatory Visit

## 2023-01-28 ENCOUNTER — Ambulatory Visit (HOSPITAL_COMMUNITY)
Admission: RE | Admit: 2023-01-28 | Discharge: 2023-01-28 | Disposition: A | Discharge status: Home / Self Care | Payer: Managed Care, Other (non HMO) | Source: Ambulatory Visit | Attending: Interventional Radiology | Admitting: Interventional Radiology

## 2023-01-28 DIAGNOSIS — S32010A Wedge compression fracture of first lumbar vertebra, initial encounter for closed fracture: Secondary | ICD-10-CM

## 2023-01-29 HISTORY — PX: IR RADIOLOGIST EVAL & MGMT: IMG5224

## 2023-02-05 NOTE — Therapy (Incomplete)
OUTPATIENT PHYSICAL THERAPY THORACOLUMBAR EVALUATION   Patient Name: Shawn Daniels MRN: [id number] DOB:August 07, 1956, 67 y.o., male Today's Date: 02/05/2023  END OF SESSION:   Past Medical History:  Diagnosis Date   Diabetes mellitus without complication ([place])    GERD (gastroesophageal reflux disease)    Hypertension    Peripheral vascular disease ([place])    Past Surgical History:  Procedure Laterality Date   ABDOMINAL AORTOGRAM W/LOWER EXTREMITY Right 08/20/2022   Procedure: ABDOMINAL AORTOGRAM W/LOWER EXTREMITY;  Surgeon: Shawn Mitchell, MD;  Location: [facility];  Service: Cardiovascular;  Laterality: Right;   ABDOMINAL AORTOGRAM W/LOWER EXTREMITY N/A 11/19/2022   Procedure: ABDOMINAL AORTOGRAM W/LOWER EXTREMITY;  Surgeon: Shawn Mitchell, MD;  Location: [facility];  Service: Cardiovascular;  Laterality: N/A;   ENDARTERECTOMY FEMORAL Right 04/26/2022   Procedure: RIGHT FEMORAL ENDARTERECTOMY;  Surgeon: Shawn Mitchell, MD;  Location: [facility];  Service: Vascular;  Laterality: Right;   INTRAVASCULAR LITHOTRIPSY Left 11/19/2022   Procedure: INTRAVASCULAR LITHOTRIPSY;  Surgeon: Shawn Mitchell, MD;  Location: [facility];  Service: Cardiovascular;  Laterality: Left;   IR KYPHO LUMBAR INC FX REDUCE BONE BX UNI/BIL CANNULATION INC/IMAGING  01/13/2023   IR RADIOLOGIST EVAL & MGMT  01/29/2023   KNEE SURGERY     LOWER EXTREMITY ANGIOGRAPHY N/A 01/01/2022   Procedure: LOWER EXTREMITY ANGIOGRAPHY;  Surgeon: Shawn Mormon, MD;  Location: [facility];  Service: Cardiovascular;  Laterality: N/A;   PATCH ANGIOPLASTY Right 04/26/2022   Procedure: PATCH ANGIOPLASTY OF RIGHT FEMORAL ARTERY USING XENOSURE BOVINE [place];  Surgeon: Shawn Mitchell, MD;  Location: [facility];  Service: Vascular;  Laterality: Right;   PENILE PROSTHESIS IMPLANT     PERIPHERAL VASCULAR ATHERECTOMY  08/20/2022   Procedure: PERIPHERAL VASCULAR ATHERECTOMY;  Surgeon: Shawn Mitchell,  MD;  Location: [facility];  Service: Cardiovascular;;  popliteal   PERIPHERAL VASCULAR BALLOON ANGIOPLASTY  01/08/2022   Procedure: PERIPHERAL VASCULAR BALLOON ANGIOPLASTY;  Surgeon: Shawn Prows, MD;  Location: [facility];  Service: Cardiovascular;;   PERIPHERAL VASCULAR INTERVENTION Left 11/19/2022   Procedure: PERIPHERAL VASCULAR INTERVENTION;  Surgeon: Shawn Mitchell, MD;  Location: [facility];  Service: Cardiovascular;  Laterality: Left;   WRIST SURGERY     Patient Active Problem List   Diagnosis Date Noted   Lumbar compression fracture, closed, initial encounter ([place]) 01/05/2023   Alcohol dependence ([place]) 01/05/2023   Sinus tachycardia 10/08/2022   PAD (peripheral artery disease) ([place]) 04/26/2022   Hypertension 04/25/2022   Insulin dependent diabetes mellitus type IA ([place]) 04/25/2022   HLD (hyperlipidemia) 04/03/2022   Claudication in peripheral vascular disease ([place]) 12/31/2021   Extensor tendon disruption 06/19/2020   Traumatic tear of supraspinatus tendon, right, initial encounter 12/02/2018   Rib contusion, right, initial encounter 12/02/2018   Trigger finger, acquired 06/29/2018   Mass of finger of left hand 11/01/2017   Closed fracture of first metacarpal bone of left hand with routine healing, subsequent encounter 09/21/2017    PCP: Shawn Daniels  REFERRING PROVIDER: Tamela Daniels  REFERRING DIAG: s/p L1 fracture sx  Rationale for Evaluation and Treatment: Rehabilitation  THERAPY DIAG:  No diagnosis found.  ONSET DATE: 01/05/23  SUBJECTIVE:  SUBJECTIVE STATEMENT: ***  PERTINENT HISTORY:  Shawn Daniels is a 67 y.o. male with past medical history significant for type 2 diabetes mellitus, essential hypertension, peripheral vascular disease, history of  Peyronie's disease s/p penile implant who presented to [facility] ED on 1/14 with complaints of low back pain following a fall at home.  Patient reports that his blood sugar was low and fell onto the fireplace hearth.  He reportedly had to crawl to the bathroom.  Imaging studies in the ED revealed L1 compression fracture.   PAIN:  Are you having pain? {OPRCPAIN:27236}  PRECAUTIONS: {Therapy precautions:24002}  WEIGHT BEARING RESTRICTIONS: {Yes ***/No:24003}  FALLS:  Has patient fallen in last 6 months? {fallsyesno:27318}  LIVING ENVIRONMENT: Lives with: {OPRC lives with:25569::"lives with their family"} Lives in: {Lives in:25570} Stairs: {opstairs:27293} Has following equipment at home: {Assistive devices:23999}  OCCUPATION: ***  PLOF: {PLOF:24004}  PATIENT GOALS: ***  NEXT MD VISIT: ***  OBJECTIVE:   DIAGNOSTIC FINDINGS:  IMPRESSION: 1. Recent compression fracture of L1 with approximately 50% height loss and 3 mm of retropulsion. 2. No acute abnormality of the thoracic spine. 3. Distended urinary bladder. Correlate for urinary retention.  PATIENT SURVEYS:  {rehab surveys:24030}  SCREENING FOR RED FLAGS: Bowel or bladder incontinence: {Yes/No:304960894} Spinal tumors: {Yes/No:304960894} Cauda equina syndrome: {Yes/No:304960894} Compression fracture: {Yes/No:304960894} Abdominal aneurysm: {Yes/No:304960894}  COGNITION: Overall cognitive status: {cognition:24006}     SENSATION: {sensation:27233}  MUSCLE LENGTH: Hamstrings: Right *** deg; Left *** deg Thomas test: Right *** deg; Left *** deg  POSTURE: {posture:25561}  PALPATION: ***  LUMBAR ROM:   AROM eval  Flexion   Extension   Right lateral flexion   Left lateral flexion   Right rotation   Left rotation    (Blank rows = not tested)  LOWER EXTREMITY ROM:     {AROM/PROM:27142}  Right eval Left eval  Hip flexion    Hip extension    Hip abduction    Hip adduction    Hip internal rotation    Hip  external rotation    Knee flexion    Knee extension    Ankle dorsiflexion    Ankle plantarflexion    Ankle inversion    Ankle eversion     (Blank rows = not tested)  LOWER EXTREMITY MMT:    MMT Right eval Left eval  Hip flexion    Hip extension    Hip abduction    Hip adduction    Hip internal rotation    Hip external rotation    Knee flexion    Knee extension    Ankle dorsiflexion    Ankle plantarflexion    Ankle inversion    Ankle eversion     (Blank rows = not tested)  LUMBAR SPECIAL TESTS:  {lumbar special test:25242}  FUNCTIONAL TESTS:  {Functional tests:24029}  GAIT: Distance walked: *** Assistive device utilized: {Assistive devices:23999} Level of assistance: {Levels of assistance:24026} Comments: ***  TODAY'S TREATMENT:  DATE: ***    PATIENT EDUCATION:  Education details: *** Person educated: {Person educated:25204} Education method: {Education Method:25205} Education comprehension: {Education Comprehension:25206}  HOME EXERCISE PROGRAM: ***  ASSESSMENT:  CLINICAL IMPRESSION: Patient is a *** y.o. *** who was seen today for physical therapy evaluation and treatment for ***.   OBJECTIVE IMPAIRMENTS: {opptimpairments:25111}.   ACTIVITY LIMITATIONS: {activitylimitations:27494}  PARTICIPATION LIMITATIONS: {participationrestrictions:25113}  PERSONAL FACTORS: {Personal factors:25162} are also affecting patient's functional outcome.   REHAB POTENTIAL: {rehabpotential:25112}  CLINICAL DECISION MAKING: {clinical decision making:25114}  EVALUATION COMPLEXITY: {Evaluation complexity:25115}   GOALS: Goals reviewed with patient? {yes/no:20286}  SHORT TERM GOALS: Target date: ***  *** Baseline: Goal status: {GOALSTATUS:25110}  2.  *** Baseline:  Goal status: {GOALSTATUS:25110}  3.  *** Baseline:  Goal status:  {GOALSTATUS:25110}  4.  *** Baseline:  Goal status: {GOALSTATUS:25110}  5.  *** Baseline:  Goal status: {GOALSTATUS:25110}  6.  *** Baseline:  Goal status: {GOALSTATUS:25110}  LONG TERM GOALS: Target date: ***  *** Baseline:  Goal status: {GOALSTATUS:25110}  2.  *** Baseline:  Goal status: {GOALSTATUS:25110}  3.  *** Baseline:  Goal status: {GOALSTATUS:25110}  4.  *** Baseline:  Goal status: {GOALSTATUS:25110}  5.  *** Baseline:  Goal status: {GOALSTATUS:25110}  6.  *** Baseline:  Goal status: {GOALSTATUS:25110}  PLAN:  PT FREQUENCY: {rehab frequency:25116}  PT DURATION: {rehab duration:25117}  PLANNED INTERVENTIONS: {rehab planned interventions:25118::"Therapeutic exercises","Therapeutic activity","Neuromuscular re-education","Balance training","Gait training","Patient/Family education","Self Care","Joint mobilization"}.  PLAN FOR NEXT SESSION: Andris Baumann, PT 02/05/2023, 5:43 PM

## 2023-02-06 ENCOUNTER — Inpatient Hospital Stay
Admission: EM | Admit: 2023-02-06 | Discharge: 2023-02-08 | DRG: 637 | Disposition: A | Discharge status: Home / Self Care | Payer: Managed Care, Other (non HMO) | Attending: Family Medicine | Admitting: Family Medicine

## 2023-02-06 ENCOUNTER — Encounter: Payer: Self-pay | Admitting: Pulmonary Disease

## 2023-02-06 ENCOUNTER — Other Ambulatory Visit: Payer: Self-pay

## 2023-02-06 ENCOUNTER — Emergency Department: Payer: Managed Care, Other (non HMO)

## 2023-02-06 ENCOUNTER — Ambulatory Visit: Payer: Managed Care, Other (non HMO) | Attending: Student

## 2023-02-06 DIAGNOSIS — E1151 Type 2 diabetes mellitus with diabetic peripheral angiopathy without gangrene: Secondary | ICD-10-CM | POA: Diagnosis present

## 2023-02-06 DIAGNOSIS — Z9889 Other specified postprocedural states: Secondary | ICD-10-CM | POA: Insufficient documentation

## 2023-02-06 DIAGNOSIS — Z880 Allergy status to penicillin: Secondary | ICD-10-CM

## 2023-02-06 DIAGNOSIS — R579 Shock, unspecified: Secondary | ICD-10-CM | POA: Diagnosis present

## 2023-02-06 DIAGNOSIS — M6281 Muscle weakness (generalized): Secondary | ICD-10-CM | POA: Insufficient documentation

## 2023-02-06 DIAGNOSIS — R739 Hyperglycemia, unspecified: Secondary | ICD-10-CM | POA: Diagnosis not present

## 2023-02-06 DIAGNOSIS — Z79899 Other long term (current) drug therapy: Secondary | ICD-10-CM | POA: Diagnosis not present

## 2023-02-06 DIAGNOSIS — R578 Other shock: Secondary | ICD-10-CM | POA: Diagnosis not present

## 2023-02-06 DIAGNOSIS — K219 Gastro-esophageal reflux disease without esophagitis: Secondary | ICD-10-CM | POA: Diagnosis present

## 2023-02-06 DIAGNOSIS — Z794 Long term (current) use of insulin: Secondary | ICD-10-CM

## 2023-02-06 DIAGNOSIS — Z87891 Personal history of nicotine dependence: Secondary | ICD-10-CM | POA: Diagnosis not present

## 2023-02-06 DIAGNOSIS — Z8249 Family history of ischemic heart disease and other diseases of the circulatory system: Secondary | ICD-10-CM

## 2023-02-06 DIAGNOSIS — M5459 Other low back pain: Secondary | ICD-10-CM | POA: Insufficient documentation

## 2023-02-06 DIAGNOSIS — N170 Acute kidney failure with tubular necrosis: Secondary | ICD-10-CM | POA: Diagnosis present

## 2023-02-06 DIAGNOSIS — Z885 Allergy status to narcotic agent status: Secondary | ICD-10-CM

## 2023-02-06 DIAGNOSIS — Z1152 Encounter for screening for COVID-19: Secondary | ICD-10-CM

## 2023-02-06 DIAGNOSIS — E1110 Type 2 diabetes mellitus with ketoacidosis without coma: Secondary | ICD-10-CM | POA: Diagnosis not present

## 2023-02-06 DIAGNOSIS — I10 Essential (primary) hypertension: Secondary | ICD-10-CM | POA: Diagnosis not present

## 2023-02-06 DIAGNOSIS — M62830 Muscle spasm of back: Secondary | ICD-10-CM | POA: Insufficient documentation

## 2023-02-06 DIAGNOSIS — Z7902 Long term (current) use of antithrombotics/antiplatelets: Secondary | ICD-10-CM | POA: Diagnosis not present

## 2023-02-06 DIAGNOSIS — I5021 Acute systolic (congestive) heart failure: Secondary | ICD-10-CM | POA: Diagnosis not present

## 2023-02-06 DIAGNOSIS — Z743 Need for continuous supervision: Secondary | ICD-10-CM | POA: Diagnosis not present

## 2023-02-06 DIAGNOSIS — Z7982 Long term (current) use of aspirin: Secondary | ICD-10-CM | POA: Diagnosis not present

## 2023-02-06 DIAGNOSIS — Z881 Allergy status to other antibiotic agents status: Secondary | ICD-10-CM

## 2023-02-06 DIAGNOSIS — G4489 Other headache syndrome: Secondary | ICD-10-CM | POA: Diagnosis not present

## 2023-02-06 DIAGNOSIS — R110 Nausea: Secondary | ICD-10-CM | POA: Diagnosis not present

## 2023-02-06 DIAGNOSIS — E785 Hyperlipidemia, unspecified: Secondary | ICD-10-CM | POA: Diagnosis not present

## 2023-02-06 DIAGNOSIS — I959 Hypotension, unspecified: Secondary | ICD-10-CM | POA: Diagnosis not present

## 2023-02-06 LAB — COMPREHENSIVE METABOLIC PANEL
ALT: 39 U/L (ref 0–44)
AST: 46 U/L — ABNORMAL HIGH (ref 15–41)
Albumin: 4.4 g/dL (ref 3.5–5.0)
Alkaline Phosphatase: 72 U/L (ref 38–126)
BUN: 31 mg/dL — ABNORMAL HIGH (ref 8–23)
CO2: <7 mmol/L — ABNORMAL LOW (ref 22–32)
Calcium: 9 mg/dL (ref 8.9–10.3)
Chloride: 92 mmol/L — ABNORMAL LOW (ref 98–111)
Creatinine, Ser: 1.73 mg/dL — ABNORMAL HIGH (ref 0.61–1.24)
GFR, Estimated: 43 mL/min — ABNORMAL LOW (ref >60)
Glucose, Bld: 322 mg/dL — ABNORMAL HIGH (ref 70–99)
Potassium: 6 mmol/L — ABNORMAL HIGH (ref 3.5–5.1)
Sodium: 129 mmol/L — ABNORMAL LOW (ref 135–145)
Total Bilirubin: 2.4 mg/dL — ABNORMAL HIGH (ref 0.3–1.2)
Total Protein: 8 g/dL (ref 6.5–8.1)

## 2023-02-06 LAB — BASIC METABOLIC PANEL
Anion gap: 21 — ABNORMAL HIGH (ref 5–15)
Anion gap: 20 — ABNORMAL HIGH (ref 5–15)
BUN: 31 mg/dL — ABNORMAL HIGH (ref 8–23)
BUN: 23 mg/dL (ref 8–23)
BUN: 24 mg/dL — ABNORMAL HIGH (ref 8–23)
CO2: <7 mmol/L — ABNORMAL LOW (ref 22–32)
CO2: 10 mmol/L — ABNORMAL LOW (ref 22–32)
CO2: 15 mmol/L — ABNORMAL LOW (ref 22–32)
Calcium: 8.3 mg/dL — ABNORMAL LOW (ref 8.9–10.3)
Calcium: 7.7 mg/dL — ABNORMAL LOW (ref 8.9–10.3)
Calcium: 8.5 mg/dL — ABNORMAL LOW (ref 8.9–10.3)
Chloride: 99 mmol/L (ref 98–111)
Chloride: 97 mmol/L — ABNORMAL LOW (ref 98–111)
Chloride: 94 mmol/L — ABNORMAL LOW (ref 98–111)
Creatinine, Ser: 1.7 mg/dL — ABNORMAL HIGH (ref 0.61–1.24)
Creatinine, Ser: 1.3 mg/dL — ABNORMAL HIGH (ref 0.61–1.24)
Creatinine, Ser: 1.2 mg/dL (ref 0.61–1.24)
GFR, Estimated: 44 mL/min — ABNORMAL LOW (ref >60)
GFR, Estimated: >60 mL/min (ref >60)
GFR, Estimated: >60 mL/min (ref >60)
Glucose, Bld: 283 mg/dL — ABNORMAL HIGH (ref 70–99)
Glucose, Bld: 962 mg/dL (ref 70–99)
Glucose, Bld: 134 mg/dL — ABNORMAL HIGH (ref 70–99)
Potassium: 5 mmol/L (ref 3.5–5.1)
Potassium: 4.1 mmol/L (ref 3.5–5.1)
Potassium: 4.4 mmol/L (ref 3.5–5.1)
Sodium: 133 mmol/L — ABNORMAL LOW (ref 135–145)
Sodium: 128 mmol/L — ABNORMAL LOW (ref 135–145)
Sodium: 129 mmol/L — ABNORMAL LOW (ref 135–145)

## 2023-02-06 LAB — URINALYSIS, W/ REFLEX TO CULTURE (INFECTION SUSPECTED)
Bilirubin Urine: NEGATIVE
Glucose, UA: >=500 mg/dL — AB
Ketones, ur: 80 mg/dL — AB
Leukocytes,Ua: NEGATIVE
Nitrite: NEGATIVE
Protein, ur: 30 mg/dL — AB
Specific Gravity, Urine: 1.017 (ref 1.005–1.030)
pH: 5 (ref 5.0–8.0)

## 2023-02-06 LAB — CBC WITH DIFFERENTIAL/PLATELET
Abs Immature Granulocytes: 0.21 10*3/uL — ABNORMAL HIGH (ref 0.00–0.07)
Basophils Absolute: 0.1 10*3/uL (ref 0.0–0.1)
Basophils Relative: 1 %
Eosinophils Absolute: 0 10*3/uL (ref 0.0–0.5)
Eosinophils Relative: 0 %
HCT: 40.7 % (ref 39.0–52.0)
Hemoglobin: 13 g/dL (ref 13.0–17.0)
Immature Granulocytes: 2 %
Lymphocytes Relative: 8 %
Lymphs Abs: 0.8 10*3/uL (ref 0.7–4.0)
MCH: 32.1 pg (ref 26.0–34.0)
MCHC: 31.9 g/dL (ref 30.0–36.0)
MCV: 100.5 fL — ABNORMAL HIGH (ref 80.0–100.0)
Monocytes Absolute: 0.4 10*3/uL (ref 0.1–1.0)
Monocytes Relative: 4 %
Neutro Abs: 8.2 10*3/uL — ABNORMAL HIGH (ref 1.7–7.7)
Neutrophils Relative %: 85 %
Platelets: 154 10*3/uL (ref 150–400)
RBC: 4.05 MIL/uL — ABNORMAL LOW (ref 4.22–5.81)
RDW: 12.4 % (ref 11.5–15.5)
WBC: 9.6 10*3/uL (ref 4.0–10.5)
nRBC: 0 % (ref 0.0–0.2)

## 2023-02-06 LAB — BLOOD GAS, VENOUS
Acid-base deficit: 24.5 mmol/L — ABNORMAL HIGH (ref 0.0–2.0)
Bicarbonate: 4.9 mmol/L — ABNORMAL LOW (ref 20.0–28.0)
O2 Saturation: 67.5 %
Patient temperature: 37
pCO2, Ven: 19 mmHg — CL (ref 44–60)
pH, Ven: 7.02 — CL (ref 7.25–7.43)
pO2, Ven: 42 mmHg (ref 32–45)

## 2023-02-06 LAB — GLUCOSE, CAPILLARY
Glucose-Capillary: 129 mg/dL — ABNORMAL HIGH (ref 70–99)
Glucose-Capillary: 126 mg/dL — ABNORMAL HIGH (ref 70–99)
Glucose-Capillary: 123 mg/dL — ABNORMAL HIGH (ref 70–99)
Glucose-Capillary: 116 mg/dL — ABNORMAL HIGH (ref 70–99)
Glucose-Capillary: 133 mg/dL — ABNORMAL HIGH (ref 70–99)
Glucose-Capillary: 153 mg/dL — ABNORMAL HIGH (ref 70–99)
Glucose-Capillary: 149 mg/dL — ABNORMAL HIGH (ref 70–99)
Glucose-Capillary: 118 mg/dL — ABNORMAL HIGH (ref 70–99)

## 2023-02-06 LAB — RESP PANEL BY RT-PCR (RSV, FLU A&B, COVID)  RVPGX2
Influenza A by PCR: NEGATIVE
Influenza B by PCR: NEGATIVE
Resp Syncytial Virus by PCR: NEGATIVE
SARS Coronavirus 2 by RT PCR: NEGATIVE

## 2023-02-06 LAB — CBG MONITORING, ED
Glucose-Capillary: 254 mg/dL — ABNORMAL HIGH (ref 70–99)
Glucose-Capillary: 292 mg/dL — ABNORMAL HIGH (ref 70–99)
Glucose-Capillary: 301 mg/dL — ABNORMAL HIGH (ref 70–99)
Glucose-Capillary: 278 mg/dL — ABNORMAL HIGH (ref 70–99)
Glucose-Capillary: 233 mg/dL — ABNORMAL HIGH (ref 70–99)
Glucose-Capillary: 190 mg/dL — ABNORMAL HIGH (ref 70–99)
Glucose-Capillary: 151 mg/dL — ABNORMAL HIGH (ref 70–99)
Glucose-Capillary: 142 mg/dL — ABNORMAL HIGH (ref 70–99)

## 2023-02-06 LAB — TROPONIN I (HIGH SENSITIVITY)
Troponin I (High Sensitivity): 6 ng/L (ref <18)
Troponin I (High Sensitivity): 8 ng/L (ref <18)

## 2023-02-06 LAB — MAGNESIUM: Magnesium: 2.1 mg/dL (ref 1.7–2.4)

## 2023-02-06 LAB — LACTIC ACID, PLASMA
Lactic Acid, Venous: 3.5 mmol/L (ref 0.5–1.9)
Lactic Acid, Venous: 3.8 mmol/L (ref 0.5–1.9)

## 2023-02-06 LAB — STREP PNEUMONIAE URINARY ANTIGEN: Strep Pneumo Urinary Antigen: NEGATIVE

## 2023-02-06 LAB — PROCALCITONIN: Procalcitonin: 0.18 ng/mL

## 2023-02-06 LAB — BETA-HYDROXYBUTYRIC ACID
Beta-Hydroxybutyric Acid: >8 mmol/L — ABNORMAL HIGH (ref 0.05–0.27)
Beta-Hydroxybutyric Acid: 7.3 mmol/L — ABNORMAL HIGH (ref 0.05–0.27)

## 2023-02-06 LAB — PHOSPHORUS: Phosphorus: 1.9 mg/dL — ABNORMAL LOW (ref 2.5–4.6)

## 2023-02-06 LAB — MRSA NEXT GEN BY PCR, NASAL: MRSA by PCR Next Gen: NOT DETECTED

## 2023-02-06 MED ORDER — DEXTROSE IN LACTATED RINGERS 5 % IV SOLN: INTRAVENOUS | Status: DC

## 2023-02-06 MED ORDER — LEVOFLOXACIN IN D5W 750 MG/150ML IV SOLN
750.0000 mg | INTRAVENOUS | Status: DC
  Filled 2023-02-06: qty 150

## 2023-02-06 MED ORDER — SODIUM CHLORIDE 0.9 % IV BOLUS
1000.0000 mL | Freq: Once | INTRAVENOUS | Status: AC
  Administered 2023-02-06: 1000 mL via INTRAVENOUS

## 2023-02-06 MED ORDER — ONDANSETRON HCL 4 MG/2ML IJ SOLN
4.0000 mg | Freq: Once | INTRAMUSCULAR | Status: AC
  Administered 2023-02-06: 4 mg via INTRAVENOUS
  Filled 2023-02-06: qty 2

## 2023-02-06 MED ORDER — SODIUM BICARBONATE 8.4 % IV SOLN
50.0000 meq | Freq: Once | INTRAVENOUS | Status: AC
  Administered 2023-02-06: 50 meq via INTRAVENOUS
  Filled 2023-02-06: qty 50

## 2023-02-06 MED ORDER — SODIUM CHLORIDE 0.9 % IV SOLN
250.0000 mL | INTRAVENOUS | Status: DC
  Administered 2023-02-06: 250 mL via INTRAVENOUS

## 2023-02-06 MED ORDER — LACTATED RINGERS IV BOLUS
1000.0000 mL | Freq: Once | INTRAVENOUS | Status: AC
  Administered 2023-02-06: 1000 mL via INTRAVENOUS

## 2023-02-06 MED ORDER — NOREPINEPHRINE 4 MG/250ML-% IV SOLN: 2.0000 ug/min | INTRAVENOUS | Status: DC

## 2023-02-06 MED ORDER — CHLORHEXIDINE GLUCONATE CLOTH 2 % EX PADS
6.0000 | MEDICATED_PAD | Freq: Every day | CUTANEOUS | Status: DC
  Administered 2023-02-06: 6 via TOPICAL
  Filled 2023-02-06: qty 6

## 2023-02-06 MED ORDER — LACTATED RINGERS IV SOLN: INTRAVENOUS | Status: DC

## 2023-02-06 MED ORDER — ALBUTEROL SULFATE (2.5 MG/3ML) 0.083% IN NEBU: 2.5000 mg | INHALATION_SOLUTION | RESPIRATORY_TRACT | Status: DC | PRN

## 2023-02-06 MED ORDER — ASPIRIN 300 MG RE SUPP: 300.0000 mg | RECTAL | Status: AC

## 2023-02-06 MED ORDER — ONDANSETRON HCL 4 MG/2ML IJ SOLN
4.0000 mg | Freq: Four times a day (QID) | INTRAMUSCULAR | Status: DC | PRN
  Administered 2023-02-06: 4 mg via INTRAVENOUS
  Filled 2023-02-06: qty 2

## 2023-02-06 MED ORDER — K PHOS MONO-SOD PHOS DI & MONO 155-852-130 MG PO TABS
500.0000 mg | ORAL_TABLET | ORAL | Status: AC
  Administered 2023-02-06 – 2023-02-07 (×4): 500 mg via ORAL
  Filled 2023-02-06 (×4): qty 2

## 2023-02-06 MED ORDER — INSULIN REGULAR(HUMAN) IN NACL 100-0.9 UT/100ML-% IV SOLN
INTRAVENOUS | Status: DC
  Administered 2023-02-06: 19 [IU]/h via INTRAVENOUS
  Filled 2023-02-06: qty 100

## 2023-02-06 MED ORDER — ASPIRIN 81 MG PO CHEW
324.0000 mg | CHEWABLE_TABLET | ORAL | Status: AC
  Administered 2023-02-06: 324 mg via ORAL
  Filled 2023-02-06: qty 4

## 2023-02-06 MED ORDER — ALBUTEROL SULFATE (2.5 MG/3ML) 0.083% IN NEBU: 2.5000 mg | INHALATION_SOLUTION | RESPIRATORY_TRACT | Status: DC

## 2023-02-06 MED ORDER — DOCUSATE SODIUM 100 MG PO CAPS: 100.0000 mg | ORAL_CAPSULE | Freq: Two times a day (BID) | ORAL | Status: DC | PRN

## 2023-02-06 MED ORDER — NOREPINEPHRINE 4 MG/250ML-% IV SOLN: 0.0000 ug/min | INTRAVENOUS | Status: DC

## 2023-02-06 MED ORDER — LEVOFLOXACIN IN D5W 750 MG/150ML IV SOLN
750.0000 mg | INTRAVENOUS | Status: DC
  Administered 2023-02-06: 750 mg via INTRAVENOUS
  Filled 2023-02-06: qty 150

## 2023-02-06 MED ORDER — DEXTROSE 50 % IV SOLN: 0.0000 mL | INTRAVENOUS | Status: DC | PRN

## 2023-02-06 MED ORDER — LEVOFLOXACIN IN D5W 750 MG/150ML IV SOLN: 750.0000 mg | Freq: Every day | INTRAVENOUS | Status: DC

## 2023-02-06 MED ORDER — NOREPINEPHRINE 4 MG/250ML-% IV SOLN
INTRAVENOUS | Status: AC
  Administered 2023-02-06: 2 ug/min via INTRAVENOUS
  Filled 2023-02-06: qty 250

## 2023-02-06 MED ORDER — POLYETHYLENE GLYCOL 3350 17 G PO PACK: 17.0000 g | PACK | Freq: Every day | ORAL | Status: DC | PRN

## 2023-02-06 NOTE — ED Triage Notes (Signed)
Pt. To ED via EMS for weakness x2 days with nausea and vomiting since yesterday. Pt. C/o chills and SOB. Pt. Had back surgery last month. Has been taking tramadol for back pain since Monday. Last dose 2 days ago. Per EMS BGL 280, pt. Has not been eating and has been taking insulin as prescribed.

## 2023-02-06 NOTE — ED Notes (Signed)
Dr. Lanney Gins at bedside

## 2023-02-06 NOTE — ED Notes (Signed)
Bladder scan at bedside, >924m urine in bladder. External male cath placed on pt. And pt. Encouraged to urinate. Discussed catheter for retention. Pt. States he feels he can urinate.

## 2023-02-06 NOTE — ED Provider Notes (Signed)
[facility] Provider Note    Event Date/Time   First MD Initiated Contact with Patient 02/06/23 [phone]     (approximate)   History     HPI  Shawn Daniels is a 67 y.o. male with a history of type II disease, hypertension, PVD, and Peyronie's disease who presents with generalized weakness nausea vomiting and chills over the last 2 days.  The patient had back surgery last month and states that he had been on Dilaudid but it was making him feel loopy.  3 days ago his doctor switched him to tramadol and the symptoms started around that time, so the patient thought that this was probably a reaction to the medication.  However, he has not taken the tramadol yesterday or today.  The patient denies any abdominal pain or diarrhea.  He has no fever.  He has no chest pain but does endorse labored breathing.  I reviewed past medical records.  Per the discharge summary from 1/23 the patient was admitted due to an L1 compression fracture and had kyphoplasty by IR.  He was prescribed methocarbamol and Dilaudid.   Physical Exam   Triage Vital Signs: ED Triage Vitals  Enc Vitals Group     BP      Pulse      Resp      Temp      Temp src      SpO2      Weight      Height      Head Circumference      Peak Flow      Pain Score      Pain Loc      Pain Edu?      Excl. in [place]?     Most recent vital signs: Vitals:   02/06/23 0900 02/06/23 0943  BP: 119/65   Pulse: (!) 117   Resp:  (!) 26  Temp:    SpO2: 95%      General: Alert and oriented, uncomfortable appearing but in no acute distress. CV:  Good peripheral perfusion.  Normal heart sounds. Resp:  Increased respiratory effort.  Lungs CTAB. Abd:  Left and nontender.  No distention.  Other:  Dry mucous membranes.  Motor intact in all extremities.   ED Results / Procedures / Treatments   Labs (all labs ordered are listed, but only abnormal results are displayed) Labs Reviewed  COMPREHENSIVE METABOLIC  PANEL - Abnormal; Notable for the following components:      Result Value   Sodium 129 (*)    Potassium 6.0 (*)    Chloride 92 (*)    CO2 <7 (*)    Glucose, Bld 322 (*)    BUN 31 (*)    Creatinine, Ser 1.73 (*)    AST 46 (*)    Total Bilirubin 2.4 (*)    GFR, Estimated 43 (*)    All other components within normal limits  CBC WITH DIFFERENTIAL/PLATELET - Abnormal; Notable for the following components:   RBC 4.05 (*)    MCV 100.5 (*)    Neutro Abs 8.2 (*)    Abs Immature Granulocytes 0.21 (*)    All other components within normal limits  LACTIC ACID, PLASMA - Abnormal; Notable for the following components:   Lactic Acid, Venous 3.5 (*)    All other components within normal limits  BLOOD GAS, VENOUS - Abnormal; Notable for the following components:   pH, Ven 7.02 (*)    pCO2, Ven  19 (*)    Bicarbonate 4.9 (*)    Acid-base deficit 24.5 (*)    All other components within normal limits  CBG MONITORING, ED - Abnormal; Notable for the following components:   Glucose-Capillary 254 (*)    All other components within normal limits  CBG MONITORING, ED - Abnormal; Notable for the following components:   Glucose-Capillary 301 (*)    All other components within normal limits  CBG MONITORING, ED - Abnormal; Notable for the following components:   Glucose-Capillary 292 (*)    All other components within normal limits  RESP PANEL BY RT-PCR (RSV, FLU A&B, COVID)  [id number]  CULTURE, BLOOD (ROUTINE X 2)  CULTURE, BLOOD (ROUTINE X 2)  LACTIC ACID, PLASMA  URINALYSIS, ROUTINE W REFLEX MICROSCOPIC  BETA-HYDROXYBUTYRIC ACID  TROPONIN I (HIGH SENSITIVITY)  TROPONIN I (HIGH SENSITIVITY)     EKG  ED ECG REPORT I, Arta Silence, the attending physician, personally viewed and interpreted this ECG.  Date: 02/06/2023 EKG Time: 0813 Rate: 104 Rhythm: Sinus tachycardia QRS Axis: normal Intervals: normal ST/T Wave abnormalities: normal Narrative Interpretation: no evidence of acute  ischemia    RADIOLOGY  Chest x-ray: I independently viewed and interpreted the images; there is no focal consolidation or edema  PROCEDURES:  Critical Care performed: Yes, see critical care procedure note(s)  .Critical Care  Performed by: Arta Silence, MD Authorized by: Arta Silence, MD   Critical care provider statement:    Critical care time (minutes):  30   Critical care time was exclusive of:  Separately billable procedures and treating other patients   Critical care was necessary to treat or prevent imminent or life-threatening deterioration of the following conditions:  Endocrine crisis   Critical care was time spent personally by me on the following activities:  Development of treatment plan with patient or surrogate, discussions with consultants, evaluation of patient's response to treatment, examination of patient, ordering and review of laboratory studies, ordering and review of radiographic studies, ordering and performing treatments and interventions, pulse oximetry, re-evaluation of patient's condition, review of old charts and obtaining history from patient or surrogate   Care discussed with: admitting provider      MEDICATIONS ORDERED IN ED: Medications  insulin regular, human (MYXREDLIN) 100 units/ 100 mL infusion (19 Units/hr Intravenous New Bag/Given 02/06/23 0940)  lactated ringers infusion ( Intravenous New Bag/Given 02/06/23 0932)  dextrose 5 % in lactated ringers infusion (has no administration in time range)  dextrose 50 % solution 0-50 mL (has no administration in time range)  sodium chloride 0.9 % bolus 1,000 mL (0 mLs Intravenous Stopped 02/06/23 1008)  ondansetron (ZOFRAN) injection 4 mg (4 mg Intravenous Given 02/06/23 0930)  lactated ringers bolus 1,000 mL (0 mLs Intravenous Stopped 02/06/23 1007)     IMPRESSION / MDM / [place] / ED COURSE  I reviewed the triage vital signs and the nursing notes.  67 year old male with PMH as  noted above presents with nausea and vomiting, generalized weakness, and shortness of breath over the last 2 days.  Differential diagnosis includes, but is not limited to, acute infection/sepsis, COVID-19 or other viral syndrome, DKA, dehydration, electrolyte abnormality, less likely medication reaction (given that he has not taken the tramadol in the last 2 days) or primary cardiac cause.  We will obtain lab workup, chest x-ray, urinalysis, give fluids, and reassess.  Patient's presentation is most consistent with acute presentation with potential threat to life or bodily function.  The patient is on the cardiac  monitor to evaluate for evidence of arrhythmia and/or significant heart rate changes.  ----------------------------------------- 10:18 AM on 02/06/2023 -----------------------------------------  Workup is most consistent with DKA.  pH is 7.02.  Glucose is 322 but bicarb is below 7 and anion gap is too high to be calculated.  Lactate is also elevated.  There is no leukocytosis.  Potassium is 6.0 but the patient has no EKG changes or indication for treatment with calcium.  I have ordered an infusion, fluid bolus, and continuous infusion of fluids.  I consulted Dr. Lanney Gins from the ICU; based on her discussion he agrees to admit the patient.   FINAL CLINICAL IMPRESSION(S) / ED DIAGNOSES   Final diagnoses:  Diabetic ketoacidosis without coma associated with type 2 diabetes mellitus ([place])     Rx / DC Orders   ED Discharge Orders     None        Note:  This document was prepared using Dragon voice recognition software and may include unintentional dictation errors.    Arta Silence, MD 02/06/23 1019

## 2023-02-06 NOTE — ED Notes (Signed)
MD at bedside for triage

## 2023-02-06 NOTE — ED Notes (Signed)
Date and time results received: 02/06/23 0854 (use smartphrase ".now" to insert current time)  Test: VBG Critical Value: pH 7.02, CO2 19  Name of Provider Notified: Siadecki at [place]  Orders Received? Or Actions Taken?: Orders Received - See Orders for details

## 2023-02-06 NOTE — H&P (Signed)
CRITICAL CARE PROGRESS NOTE    Name: Shawn Daniels MRN: [id number] DOB: Jun 06, 1956     LOS: 0   SUBJECTIVE FINDINGS & SIGNIFICANT EVENTS    History of presenting illness:  67 yo M with hx of DM, denies noncompliance with medication , pVD, HTN came in due to worsening Nausea and vomiting but denies loose stools.  He had Kyphoplasty and was dcd 01/14/23. He was on narcotics last month and stopped this treatment on his own due to feeling abnormal.  He did start new therapy with tramadol and notes worsening symptoms in parallel with this new medication. He denies noncompliance with medication.  He denies sick contacts or travel.   Lines/tubes :   Microbiology/Sepsis markers: Results for orders placed or performed during the hospital encounter of 02/06/23  Resp panel by RT-PCR (RSV, Flu A&B, Covid) Anterior Nasal Swab     Status: None   Collection Time: 02/06/23  8:34 AM   Specimen: Anterior Nasal Swab  Result Value Ref Range Status   SARS Coronavirus 2 by RT PCR NEGATIVE NEGATIVE Final    Comment: (NOTE) SARS-CoV-2 target nucleic acids are NOT DETECTED.  The SARS-CoV-2 RNA is generally detectable in upper respiratory specimens during the acute phase of infection. The lowest concentration of SARS-CoV-2 viral copies this assay can detect is 138 copies/mL. A negative result does not preclude SARS-Cov-2 infection and should not be used as the sole basis for treatment or other patient management decisions. A negative result may occur with  improper specimen collection/handling, submission of specimen other than nasopharyngeal swab, presence of viral mutation(s) within the areas targeted by this assay, and inadequate number of viral copies(<138 copies/mL). A negative result must be combined with clinical  observations, patient history, and epidemiological information. The expected result is Negative.  Fact Sheet for Patients:  EntrepreneurPulse.com.au  Fact Sheet for Healthcare Providers:  IncredibleEmployment.be  This test is no t yet approved or cleared by the [place] FDA and  has been authorized for detection and/or diagnosis of SARS-CoV-2 by FDA under an Emergency Use Authorization (EUA). This EUA will remain  in effect (meaning this test can be used) for the duration of the COVID-19 declaration under Section 564(b)(1) of the Act, 21 U.S.C.section 360bbb-3(b)(1), unless the authorization is terminated  or revoked sooner.       Influenza A by PCR NEGATIVE NEGATIVE Final   Influenza B by PCR NEGATIVE NEGATIVE Final    Comment: (NOTE) The Xpert Xpress SARS-CoV-2/FLU/RSV plus assay is intended as an aid in the diagnosis of influenza from Nasopharyngeal swab specimens and should not be used as a sole basis for treatment. Nasal washings and aspirates are unacceptable for Xpert Xpress SARS-CoV-2/FLU/RSV testing.  Fact Sheet for Patients: EntrepreneurPulse.com.au  Fact Sheet for Healthcare Providers: IncredibleEmployment.be  This test is not yet approved or cleared by the [place] FDA and has been authorized for detection and/or diagnosis of SARS-CoV-2 by FDA under an Emergency Use Authorization (EUA). This EUA will remain in effect (meaning this test can be used) for the duration of the COVID-19 declaration under Section 564(b)(1) of the Act, 21 U.S.C. section 360bbb-3(b)(1), unless the authorization is terminated or revoked.     Resp Syncytial Virus by PCR NEGATIVE NEGATIVE Final    Comment: (NOTE) Fact Sheet for Patients: EntrepreneurPulse.com.au  Fact Sheet for Healthcare Providers: IncredibleEmployment.be  This test is not yet approved or cleared by  the [place] FDA and has been authorized for detection and/or diagnosis of SARS-CoV-2  by FDA under an Emergency Use Authorization (EUA). This EUA will remain in effect (meaning this test can be used) for the duration of the COVID-19 declaration under Section 564(b)(1) of the Act, 21 U.S.C. section 360bbb-3(b)(1), unless the authorization is terminated or revoked.  Performed at [facility], [address] [place], [place] [address]     Anti-infectives:  Anti-infectives (From admission, onward)    None         PAST MEDICAL HISTORY   Past Medical History:  Diagnosis Date   Diabetes mellitus without complication (HCC)    GERD (gastroesophageal reflux disease)    Hypertension    Peripheral vascular disease ([place])      SURGICAL HISTORY   Past Surgical History:  Procedure Laterality Date   ABDOMINAL AORTOGRAM W/LOWER EXTREMITY Right 08/20/2022   Procedure: ABDOMINAL AORTOGRAM W/LOWER EXTREMITY;  Surgeon: Serafina Mitchell, MD;  Location: [place] [facility];  Service: Cardiovascular;  Laterality: Right;   ABDOMINAL AORTOGRAM W/LOWER EXTREMITY N/A 11/19/2022   Procedure: ABDOMINAL AORTOGRAM W/LOWER EXTREMITY;  Surgeon: Serafina Mitchell, MD;  Location: [place] [facility];  Service: Cardiovascular;  Laterality: N/A;   ENDARTERECTOMY FEMORAL Right 04/26/2022   Procedure: RIGHT FEMORAL ENDARTERECTOMY;  Surgeon: Serafina Mitchell, MD;  Location: [place];  Service: Vascular;  Laterality: Right;   INTRAVASCULAR LITHOTRIPSY Left 11/19/2022   Procedure: INTRAVASCULAR LITHOTRIPSY;  Surgeon: Serafina Mitchell, MD;  Location: [place] [facility];  Service: Cardiovascular;  Laterality: Left;   IR KYPHO LUMBAR INC FX REDUCE BONE BX UNI/BIL CANNULATION INC/IMAGING  01/13/2023   IR RADIOLOGIST EVAL & MGMT  01/29/2023   KNEE SURGERY     LOWER EXTREMITY ANGIOGRAPHY N/A 01/01/2022   Procedure: LOWER EXTREMITY ANGIOGRAPHY;  Surgeon: Nigel Mormon, MD;  Location: [place] [facility];   Service: Cardiovascular;  Laterality: N/A;   PATCH ANGIOPLASTY Right 04/26/2022   Procedure: PATCH ANGIOPLASTY OF RIGHT FEMORAL ARTERY USING XENOSURE BOVINE Zearing;  Surgeon: Serafina Mitchell, MD;  Location: [facility];  Service: Vascular;  Laterality: Right;   PENILE PROSTHESIS IMPLANT     PERIPHERAL VASCULAR ATHERECTOMY  08/20/2022   Procedure: PERIPHERAL VASCULAR ATHERECTOMY;  Surgeon: Serafina Mitchell, MD;  Location: [place] [facility];  Service: Cardiovascular;;  popliteal   PERIPHERAL VASCULAR BALLOON ANGIOPLASTY  01/08/2022   Procedure: PERIPHERAL VASCULAR BALLOON ANGIOPLASTY;  Surgeon: Adrian Prows, MD;  Location: [place] [facility];  Service: Cardiovascular;;   PERIPHERAL VASCULAR INTERVENTION Left 11/19/2022   Procedure: PERIPHERAL VASCULAR INTERVENTION;  Surgeon: Serafina Mitchell, MD;  Location: [place] [facility];  Service: Cardiovascular;  Laterality: Left;   WRIST SURGERY       FAMILY HISTORY   Family History  Problem Relation Age of Onset   Heart failure Father      SOCIAL HISTORY   Social History   Tobacco Use   Smoking status: Former    Packs/day: 1.50    Years: 30.00    Total pack years: 45.00    Types: Cigarettes, Cigars    Quit date: 2009    Years since quitting: 15.1   Smokeless tobacco: Never  Vaping Use   Vaping Use: Never used  Substance Use Topics   Alcohol use: Yes    Alcohol/week: 3.0 standard drinks of alcohol    Types: 3 Cans of beer per week    Comment: daily   Drug use: Never     MEDICATIONS   Current Medication:  Current Facility-Administered Medications:  albuterol (PROVENTIL) (2.5 MG/3ML) 0.083% nebulizer solution 2.5 mg, 2.5 mg, Nebulization, Q4H, Lynnae Ludemann, MD   albuterol (PROVENTIL) (2.5 MG/3ML) 0.083% nebulizer solution 2.5 mg, 2.5 mg, Nebulization, Q2H PRN, Ottie Glazier, MD   aspirin chewable tablet 324 mg, 324 mg, Oral, NOW **OR** aspirin suppository 300 mg, 300 mg, Rectal, NOW, Emari Demmer, MD   dextrose 5 %  in lactated ringers infusion, , Intravenous, Continuous, Siadecki, Sebastian, MD   dextrose 50 % solution 0-50 mL, 0-50 mL, Intravenous, PRN, Arta Silence, MD   docusate sodium (COLACE) capsule 100 mg, 100 mg, Oral, BID PRN, Lanney Gins, Darshan Solanki, MD   insulin regular, human (MYXREDLIN) 100 units/ 100 mL infusion, , Intravenous, Continuous, Siadecki, Felix Ahmadi, MD, Last Rate: 19 mL/hr at 02/06/23 1100, 19 Units/hr at 02/06/23 1100   lactated ringers infusion, , Intravenous, Continuous, Siadecki, Felix Ahmadi, MD, Last Rate: 125 mL/hr at 02/06/23 0932, New Bag at 02/06/23 0932   norepinephrine (LEVOPHED) 32m in 2574m(0.016 mg/mL) premix infusion, 0-40 mcg/min, Intravenous, Continuous, Siadecki, SeFelix AhmadiMD, Last Rate: 22.5 mL/hr at 02/06/23 1106, 6 mcg/min at 02/06/23 1106   ondansetron (ZOFRAN) injection 4 mg, 4 mg, Intravenous, Q6H PRN, AlLanney GinsFuad, MD   polyethylene glycol (MIRALAX / GLYCOLAX) packet 17 g, 17 g, Oral, Daily PRN, AlOttie GlazierMD  Current Outpatient Medications:    aspirin EC 81 MG tablet, Take 1 tablet (81 mg total) by mouth at bedtime., Disp: 30 tablet, Rfl: 12   clopidogrel (PLAVIX) 75 MG tablet, Take 1 tablet (75 mg total) by mouth daily., Disp: 90 tablet, Rfl: 2   Empagliflozin-linaGLIPtin 25-5 MG TABS, Take 1 tablet by mouth in the morning., Disp: , Rfl:    Evolocumab (REPATHA SURECLICK) [id number]/ML SOAJ, Inject 1 pen. into the skin every 14 (fourteen) days., Disp: 6 mL, Rfl: 3   fenofibrate (TRICOR) 145 MG tablet, Take 1 tablet (145 mg total) by mouth daily., Disp: 90 tablet, Rfl: 3   lisinopril (PRINIVIL,ZESTRIL) 10 MG tablet, Take 10 mg by mouth in the morning and at bedtime., Disp: , Rfl:    Magnesium 400 MG CAPS, Take 400 mg by mouth in the morning and at bedtime., Disp: , Rfl:    methocarbamol (ROBAXIN) 500 MG tablet, Take 1 tablet (500 mg total) by mouth every 6 (six) hours as needed for muscle spasms., Disp: 30 tablet, Rfl: 0   metoprolol succinate (TOPROL XL)  25 MG 24 hr tablet, Take 1 tablet (25 mg total) by mouth daily. (Patient taking differently: Take 25 mg by mouth at bedtime.), Disp: 90 tablet, Rfl: 3   Multiple Vitamins-Minerals (MULTIVITAMIN WITH MINERALS) tablet, Take 1 tablet by mouth daily., Disp: , Rfl:    nitroGLYCERIN (NITRODUR - DOSED IN MG/24 HR) 0.2 mg/hr patch, APPLY 1/4 (ONE-FOURTH) PATCH TO AFFECTED ELBOW, CHANGE DAILY (Patient taking differently: Place 0.05-0.1 mg onto the skin daily.), Disp: 30 patch, Rfl: 1   NOVOLIN 70/30 RELION (70-30) 100 UNIT/ML injection, Inject 30 Units into the skin See admin instructions. Inject up to 30u under the skin twice daily, according to sliding scale, Disp: , Rfl:    omeprazole (PRILOSEC) 20 MG capsule, Take 20 mg by mouth daily before breakfast., Disp: , Rfl:    rosuvastatin (CRESTOR) 10 MG tablet, Take 10 mg by mouth at bedtime., Disp: , Rfl:    traMADol (ULTRAM) 50 MG tablet, Take 50 mg by mouth 4 (four) times daily as needed for moderate pain., Disp: , Rfl:    HYDROmorphone (DILAUDID) 2 MG tablet, Take 0.5 tablets (1  mg total) by mouth every 6 (six) hours as needed for moderate pain. (Patient not taking: Reported on 02/06/2023), Disp: 20 tablet, Rfl: 0  Facility-Administered Medications Ordered in Other Encounters:    iodixanol (VISIPAQUE) 320 MG/ML injection, , , PRN, Serafina Mitchell, MD, 120 mL at 08/20/22 1145    ALLERGIES   Codeine, Keflex [cephalexin], and Penicillins    REVIEW OF SYSTEMS    10 point ROS done and is negative except as per HPI  PHYSICAL EXAMINATION   Vital Signs: Temp:  [97.1 F (36.2 C)] 97.1 F (36.2 C) (02/15 0812) Pulse Rate:  [94-117] 115 (02/15 1110) Resp:  [20-28] 20 (02/15 1110) BP: (79-134)/(41-72) 89/55 (02/15 1110) SpO2:  [95 %-100 %] 98 % (02/15 1110) Weight:  [88.5 kg] 88.5 kg (02/15 0814)  GENERAL:Age appropriate moderate distress HEAD: Normocephalic, atraumatic.  EYES: Pupils equal, round, reactive to light.  No scleral icterus.  MOUTH:  Moist mucosal membrane. NECK: Supple. No thyromegaly. No nodules. No JVD.  PULMONARY: rhonchi bilaterally CARDIOVASCULAR: S1 and S2. Regular rate and rhythm. No murmurs, rubs, or gallops.  GASTROINTESTINAL: Soft, nontender, non-distended. No masses. Positive bowel sounds. No hepatosplenomegaly.  MUSCULOSKELETAL: No swelling, clubbing, or edema.  NEUROLOGIC: Mild to moderate distress due to acute illness SKIN:intact,warm,dry   PERTINENT DATA     Infusions:  dextrose 5% lactated ringers     insulin 19 Units/hr (02/06/23 1100)   lactated ringers 125 mL/hr at 02/06/23 0932   norepinephrine (LEVOPHED) Adult infusion 6 mcg/min (02/06/23 1106)   Scheduled Medications:  albuterol  2.5 mg Nebulization Q4H   aspirin  324 mg Oral NOW   Or   aspirin  300 mg Rectal NOW   PRN Medications: albuterol, dextrose, docusate sodium, ondansetron (ZOFRAN) IV, polyethylene glycol Hemodynamic parameters:   Intake/Output: No intake/output data recorded.  Ventilator  Settings:    LAB RESULTS:  Basic Metabolic Panel: Recent Labs  Lab 02/06/23 0834  NA 129*  K 6.0*  CL 92*  CO2 <7*  GLUCOSE 322*  BUN 31*  CREATININE 1.73*  CALCIUM 9.0   Liver Function Tests: Recent Labs  Lab 02/06/23 0834  AST 46*  ALT 39  ALKPHOS 72  BILITOT 2.4*  PROT 8.0  ALBUMIN 4.4   No results for input(s): "LIPASE", "AMYLASE" in the last 168 hours. No results for input(s): "AMMONIA" in the last 168 hours. CBC: Recent Labs  Lab 02/06/23 0834  WBC 9.6  NEUTROABS 8.2*  HGB 13.0  HCT 40.7  MCV 100.5*  PLT 154   Cardiac Enzymes: No results for input(s): "CKTOTAL", "CKMB", "CKMBINDEX", "TROPONINI" in the last 168 hours. BNP: Invalid input(s): "POCBNP" CBG: Recent Labs  Lab 02/06/23 0816 02/06/23 0934 02/06/23 1001 02/06/23 1059  GLUCAP 254* 301* 292* 278*       IMAGING RESULTS:  Imaging: DG Chest Port 1 View  Result Date: 02/06/2023 CLINICAL DATA:  Weakness. EXAM: PORTABLE CHEST 1  VIEW COMPARISON:  01/05/2023 FINDINGS: Prominent lung markings are similar to the previous examination. No evidence for acute airspace disease or pulmonary edema. Heart and mediastinum are within normal limits. Trachea is midline. Old right rib fractures. Negative for a pneumothorax. IMPRESSION: No acute cardiopulmonary disease. Electronically Signed   By: Markus Daft M.D.   On: 02/06/2023 08:34   @PROBHOSP$ @ DG Chest Port 1 View  Result Date: 02/06/2023 CLINICAL DATA:  Weakness. EXAM: PORTABLE CHEST 1 VIEW COMPARISON:  01/05/2023 FINDINGS: Prominent lung markings are similar to the previous examination. No evidence for acute airspace disease or  pulmonary edema. Heart and mediastinum are within normal limits. Trachea is midline. Old right rib fractures. Negative for a pneumothorax. IMPRESSION: No acute cardiopulmonary disease. Electronically Signed   By: Markus Daft M.D.   On: 02/06/2023 08:34     ASSESSMENT AND PLAN    -Multidisciplinary rounds held today  Diabetic Ketoacidosis    Continue insulin drip with goal to improve blood glucose by 60-80 per hour      Currently at 19units per hour  - follow phase 1-3 DKA protocol   - patient with large anion gap still with BMP HCO3 <7   Circulatory shock -use vasopressors to keep MAP>65- currently on levophed  -follow ABG and LA -follow up cultures -emperic ABX- Levofloxacin , patient has PCN allergy -consider stress dose steroids   Renal Failure-most likely due to transient hypotension with ischemia -follow chem 7 -follow UO -continue Foley Catheter-assess need daily   ID -continue IV abx as prescibed -follow up cultures  GI/Nutrition GI PROPHYLAXIS as indicated DIET-->TF's as tolerated Constipation protocol as indicated  ENDO - ICU hypoglycemic\Hyperglycemia protocol -diabetic coordinator -check FSBS per protocol   ELECTROLYTES -follow labs as needed -replace as needed -pharmacy consultation   DVT/GI PRX ordered -SCDs   TRANSFUSIONS AS NEEDED MONITOR FSBS ASSESS the need for LABS as needed   Critical care provider statement:   Total critical care time: 33 minutes   Performed by: Lanney Gins MD   Critical care time was exclusive of separately billable procedures and treating other patients.   Critical care was necessary to treat or prevent imminent or life-threatening deterioration.   Critical care was time spent personally by me on the following activities: development of treatment plan with patient and/or surrogate as well as nursing, discussions with consultants, evaluation of patient's response to treatment, examination of patient, obtaining history from patient or surrogate, ordering and performing treatments and interventions, ordering and review of laboratory studies, ordering and review of radiographic studies, pulse oximetry and re-evaluation of patient's condition.    Ottie Glazier, M.D.  Pulmonary & [place]

## 2023-02-06 NOTE — Progress Notes (Signed)
Pharmacy Antibiotic Note  Shawn Daniels is a 67 y.o. male w/ h/O PVD, HTN admitted on 02/06/2023 with shock. Pharmacy has been consulted for levofloxacin dosing.  Allergy to penicillins from childhood as well as allergy to cephalexin with skin reaction.  Plan: adjust levofloxacin IV to 750 mg every 24 hours  ---follow renal function for needed dose adjustments  Continue to monitor renal function and clinical course.  Height: 6' 1"$  (185.4 cm) Weight: 85.4 kg (188 lb 4.4 oz) IBW/kg (Calculated) : 79.9  Temp (24hrs), Avg:97.7 F (36.5 C), Min:97.1 F (36.2 C), Max:98.3 F (36.8 C)  Recent Labs  Lab 02/06/23 0834 02/06/23 1058 02/06/23 1102 02/06/23 1535  WBC 9.6  --   --   --   CREATININE 1.73*  --  1.70* 1.30*  LATICACIDVEN 3.5* 3.8*  --   --      Estimated Creatinine Clearance: 64 mL/min (A) (by C-G formula based on SCr of 1.3 mg/dL (H)).    Allergies  Allergen Reactions   Codeine Itching and Other (See Comments)    Can take/tolerate in lower doses   Keflex [Cephalexin] Other (See Comments)    "Shriveled my skin"   Penicillins Other (See Comments)    Reaction from childhood not recalled    Antimicrobials this admission: levofloxacin 2/15 >>   Microbiology results: 2/15 BCx: in process 2/15 MRSA PCR: negative 2/15 Resp panel: negative  Thank you for allowing pharmacy to be a part of this patient's care.  Vallery Sa, PharmD, BCPS Clinical Pharmacist  02/06/2023 4:32 PM

## 2023-02-06 NOTE — ED Notes (Signed)
RN aware bed assigned ?

## 2023-02-06 NOTE — Progress Notes (Signed)
Pharmacy Antibiotic Note  Shawn Daniels is a 67 y.o. male admitted on 02/06/2023 with shock. Pharmacy has been consulted for levofloxacin dosing.  CrCl 48.1 mL/hr. Allergy to penicillins from childhood as well as allergy to cephalexin with skin reaction.  Plan: Start levofloxacin IV 750 mg every 48 hours  Dose adjusted for renal function  Continue to monitor renal function and clinical course.  Height: 6' 1"$  (185.4 cm) Weight: 88.5 kg (195 lb 3.2 oz) IBW/kg (Calculated) : 79.9  Temp (24hrs), Avg:97.1 F (36.2 C), Min:97.1 F (36.2 C), Max:97.1 F (36.2 C)  Recent Labs  Lab 02/06/23 0834 02/06/23 1058  WBC 9.6  --   CREATININE 1.73*  --   LATICACIDVEN 3.5* 3.8*    Estimated Creatinine Clearance: 48.1 mL/min (A) (by C-G formula based on SCr of 1.73 mg/dL (H)).    Allergies  Allergen Reactions   Codeine Itching and Other (See Comments)    Can take/tolerate in lower doses   Keflex [Cephalexin] Other (See Comments)    "Shriveled my skin"   Penicillins Other (See Comments)    Reaction from childhood not recalled    Antimicrobials this admission: levofloxacin 2/15 >>    Dose adjustments this admission: N/a  Microbiology results: 2/15 BCx: in process  Thank you for allowing pharmacy to be a part of this patient's care.  Glean Salvo, PharmD, BCPS Clinical Pharmacist  02/06/2023 12:00 PM

## 2023-02-06 NOTE — ED Notes (Signed)
[employer] recommends insulin infusion rate 19units/hr. Dr. Cherylann Banas notified and is okay with ordered rate. Will recheck blood sugar in 15-30 minutes.

## 2023-02-06 NOTE — Inpatient Diabetes Management (Signed)
Inpatient Diabetes Program Recommendations  AACE/ADA: New Consensus Statement on Inpatient Glycemic Control   Target Ranges:  Prepandial:   less than 140 mg/dL      Peak postprandial:   less than 180 mg/dL (1-2 hours)      Critically ill patients:  140 - 180 mg/dL    Latest Reference Range & Units 02/06/23 09:34 02/06/23 10:01 02/06/23 10:59 02/06/23 12:02 02/06/23 13:08  Glucose-Capillary 70 - 99 mg/dL 301 (H) 292 (H) 278 (H) 233 (H) 190 (H)    Latest Reference Range & Units 02/06/23 08:34 02/06/23 11:02  CO2 22 - 32 mmol/L <7 (L) <7 (L)  Glucose 70 - 99 mg/dL 322 (H) 283 (H)  BUN 8 - 23 mg/dL 31 (H) 31 (H)  Creatinine 0.61 - 1.24 mg/dL 1.73 (H) 1.70 (H)  Anion gap 5 - 15  NOT CALCULATED NOT CALCULATED    Latest Reference Range & Units 02/06/23 09:47  Beta-Hydroxybutyric Acid 0.05 - 0.27 mmol/L >8.00 (H)     Review of Glycemic Control  Diabetes history: DM Outpatient Diabetes medications: 70/30 30 units BID, Glyambi 25-5 mg QAM Current orders for Inpatient glycemic control: IV insulin per DKA  Inpatient Diabetes Program Recommendations:    Insulin: IV insulin should be continued until acidosis has completely resolved.   NOTE: Patient in ED with weakness, N/V, chills/fever; not eating so not taking insulin. Patient noted to be in DKA and started on IV insulin. IV insulin should be continued until acidosis has completely resolved.  Patient was recently inpatient 01/05/23-01/14/23 with lumbar compression fracture. Inpatient diabetes coordinator spoke with patient on 1/15 and 1/17 during prior admission.  Inpatient diabetes team will follow along and make further recommendations as needed based on glucose trends.  Thanks, Barnie Alderman, RN, MSN, [place] Diabetes Coordinator Inpatient Diabetes Program [phone] (Team Pager from 8am to [place])

## 2023-02-07 ENCOUNTER — Inpatient Hospital Stay
Admit: 2023-02-07 | Discharge: 2023-02-07 | Disposition: A | Discharge status: Home / Self Care | Payer: Managed Care, Other (non HMO) | Attending: Pulmonary Disease | Admitting: Pulmonary Disease

## 2023-02-07 DIAGNOSIS — I5021 Acute systolic (congestive) heart failure: Secondary | ICD-10-CM

## 2023-02-07 DIAGNOSIS — R579 Shock, unspecified: Secondary | ICD-10-CM | POA: Diagnosis not present

## 2023-02-07 LAB — BASIC METABOLIC PANEL
Anion gap: 14 (ref 5–15)
Anion gap: 15 (ref 5–15)
BUN: 15 mg/dL (ref 8–23)
BUN: 18 mg/dL (ref 8–23)
CO2: 19 mmol/L — ABNORMAL LOW (ref 22–32)
CO2: 20 mmol/L — ABNORMAL LOW (ref 22–32)
Calcium: 8.7 mg/dL — ABNORMAL LOW (ref 8.9–10.3)
Calcium: 9 mg/dL (ref 8.9–10.3)
Chloride: 100 mmol/L (ref 98–111)
Chloride: 100 mmol/L (ref 98–111)
Creatinine, Ser: 0.88 mg/dL (ref 0.61–1.24)
Creatinine, Ser: 1 mg/dL (ref 0.61–1.24)
GFR, Estimated: >60 mL/min (ref >60)
GFR, Estimated: >60 mL/min (ref >60)
Glucose, Bld: 115 mg/dL — ABNORMAL HIGH (ref 70–99)
Glucose, Bld: 137 mg/dL — ABNORMAL HIGH (ref 70–99)
Potassium: 4 mmol/L (ref 3.5–5.1)
Potassium: 4.8 mmol/L (ref 3.5–5.1)
Sodium: 134 mmol/L — ABNORMAL LOW (ref 135–145)
Sodium: 134 mmol/L — ABNORMAL LOW (ref 135–145)

## 2023-02-07 LAB — ECHOCARDIOGRAM COMPLETE
AR max vel: 3.14 cm2
AV Area VTI: 2.94 cm2
AV Area mean vel: 2.77 cm2
AV Mean grad: 4 mmHg
AV Peak grad: 6.5 mmHg
Ao pk vel: 1.27 m/s
Area-P 1/2: 3.43 cm2
Height: 73 in
MV VTI: 3.76 cm2
S' Lateral: 3.2 cm
Weight: 3026.47 oz

## 2023-02-07 LAB — BLOOD CULTURE ID PANEL (REFLEXED) - BCID2

## 2023-02-07 LAB — GLUCOSE, CAPILLARY
Glucose-Capillary: 116 mg/dL — ABNORMAL HIGH (ref 70–99)
Glucose-Capillary: 119 mg/dL — ABNORMAL HIGH (ref 70–99)
Glucose-Capillary: 130 mg/dL — ABNORMAL HIGH (ref 70–99)
Glucose-Capillary: 130 mg/dL — ABNORMAL HIGH (ref 70–99)
Glucose-Capillary: 140 mg/dL — ABNORMAL HIGH (ref 70–99)
Glucose-Capillary: 152 mg/dL — ABNORMAL HIGH (ref 70–99)
Glucose-Capillary: 152 mg/dL — ABNORMAL HIGH (ref 70–99)
Glucose-Capillary: 157 mg/dL — ABNORMAL HIGH (ref 70–99)
Glucose-Capillary: 160 mg/dL — ABNORMAL HIGH (ref 70–99)
Glucose-Capillary: 258 mg/dL — ABNORMAL HIGH (ref 70–99)

## 2023-02-07 LAB — LACTIC ACID, PLASMA: Lactic Acid, Venous: 1.5 mmol/L (ref 0.5–1.9)

## 2023-02-07 LAB — CBC
HCT: 33.4 % — ABNORMAL LOW (ref 39.0–52.0)
Hemoglobin: 11.8 g/dL — ABNORMAL LOW (ref 13.0–17.0)
MCH: 32.6 pg (ref 26.0–34.0)
MCHC: 35.3 g/dL (ref 30.0–36.0)
MCV: 92.3 fL (ref 80.0–100.0)
Platelets: 107 10*3/uL — ABNORMAL LOW (ref 150–400)
RBC: 3.62 MIL/uL — ABNORMAL LOW (ref 4.22–5.81)
RDW: 12.6 % (ref 11.5–15.5)
WBC: 7.2 10*3/uL (ref 4.0–10.5)
nRBC: 0 % (ref 0.0–0.2)

## 2023-02-07 LAB — BLOOD GAS, VENOUS
Acid-base deficit: 4.8 mmol/L — ABNORMAL HIGH (ref 0.0–2.0)
Bicarbonate: 20.5 mmol/L (ref 20.0–28.0)
O2 Saturation: 43.4 %
Patient temperature: 37
pCO2, Ven: 38 mmHg — ABNORMAL LOW (ref 44–60)
pH, Ven: 7.34 (ref 7.25–7.43)
pO2, Ven: <31 mmHg — CL (ref 32–45)

## 2023-02-07 LAB — LEGIONELLA PNEUMOPHILA SEROGP 1 UR AG: L. pneumophila Serogp 1 Ur Ag: NEGATIVE

## 2023-02-07 LAB — HEPATIC FUNCTION PANEL
ALT: 95 U/L — ABNORMAL HIGH (ref 0–44)
AST: 125 U/L — ABNORMAL HIGH (ref 15–41)
Albumin: 3.5 g/dL (ref 3.5–5.0)
Alkaline Phosphatase: 52 U/L (ref 38–126)
Bilirubin, Direct: 0.2 mg/dL (ref 0.0–0.2)
Indirect Bilirubin: 1.4 mg/dL — ABNORMAL HIGH (ref 0.3–0.9)
Total Bilirubin: 1.6 mg/dL — ABNORMAL HIGH (ref 0.3–1.2)
Total Protein: 6.2 g/dL — ABNORMAL LOW (ref 6.5–8.1)

## 2023-02-07 LAB — PHOSPHORUS: Phosphorus: 2.7 mg/dL (ref 2.5–4.6)

## 2023-02-07 LAB — PROCALCITONIN: Procalcitonin: 0.16 ng/mL

## 2023-02-07 LAB — MAGNESIUM: Magnesium: 2.1 mg/dL (ref 1.7–2.4)

## 2023-02-07 MED ORDER — INSULIN ASPART 100 UNIT/ML IJ SOLN: 0.0000 [IU] | INTRAMUSCULAR | Status: DC

## 2023-02-07 MED ORDER — INSULIN GLARGINE-YFGN 100 UNIT/ML ~~LOC~~ SOLN
14.0000 [IU] | Freq: Every day | SUBCUTANEOUS | Status: DC
  Administered 2023-02-07: 14 [IU] via SUBCUTANEOUS
  Filled 2023-02-07: qty 0.14

## 2023-02-07 MED ORDER — SODIUM CHLORIDE 0.9 % IV SOLN: 1.0000 g | Freq: Three times a day (TID) | INTRAVENOUS | Status: DC

## 2023-02-07 MED ORDER — FENOFIBRATE 160 MG PO TABS
160.0000 mg | ORAL_TABLET | Freq: Every day | ORAL | Status: DC
  Administered 2023-02-07: 160 mg via ORAL
  Filled 2023-02-07 (×3): qty 1

## 2023-02-07 MED ORDER — LOPERAMIDE HCL 2 MG PO CAPS
2.0000 mg | ORAL_CAPSULE | ORAL | Status: DC | PRN
  Administered 2023-02-07: 2 mg via ORAL
  Filled 2023-02-07: qty 1

## 2023-02-07 MED ORDER — ALPRAZOLAM 0.5 MG PO TABS
0.5000 mg | ORAL_TABLET | Freq: Every evening | ORAL | Status: DC | PRN
  Administered 2023-02-07: 0.5 mg via ORAL
  Filled 2023-02-07: qty 1

## 2023-02-07 MED ORDER — SODIUM CHLORIDE 0.9 % IV SOLN
1.0000 g | Freq: Three times a day (TID) | INTRAVENOUS | Status: DC
  Administered 2023-02-07 – 2023-02-08 (×2): 1 g via INTRAVENOUS
  Filled 2023-02-07 (×3): qty 20

## 2023-02-07 MED ORDER — METOPROLOL SUCCINATE ER 25 MG PO TB24
25.0000 mg | ORAL_TABLET | Freq: Every day | ORAL | Status: DC
  Administered 2023-02-07: 25 mg via ORAL
  Filled 2023-02-07: qty 1

## 2023-02-07 MED ORDER — PANTOPRAZOLE SODIUM 40 MG PO TBEC: 40.0000 mg | DELAYED_RELEASE_TABLET | Freq: Every day | ORAL | Status: DC

## 2023-02-07 MED ORDER — INSULIN ASPART 100 UNIT/ML IJ SOLN
0.0000 [IU] | Freq: Three times a day (TID) | INTRAMUSCULAR | Status: DC
  Administered 2023-02-07: 2 [IU] via SUBCUTANEOUS

## 2023-02-07 MED ORDER — INSULIN ASPART 100 UNIT/ML IJ SOLN: 0.0000 [IU] | Freq: Three times a day (TID) | INTRAMUSCULAR | Status: DC

## 2023-02-07 MED ORDER — CLOPIDOGREL BISULFATE 75 MG PO TABS
75.0000 mg | ORAL_TABLET | Freq: Every day | ORAL | Status: DC
  Administered 2023-02-07: 75 mg via ORAL
  Filled 2023-02-07: qty 1

## 2023-02-07 MED ORDER — ROSUVASTATIN CALCIUM 10 MG PO TABS
10.0000 mg | ORAL_TABLET | Freq: Every day | ORAL | Status: DC
  Administered 2023-02-07: 10 mg via ORAL
  Filled 2023-02-07: qty 1

## 2023-02-07 MED ORDER — ASPIRIN 81 MG PO CHEW
81.0000 mg | CHEWABLE_TABLET | Freq: Every day | ORAL | Status: DC
  Administered 2023-02-07: 81 mg via ORAL
  Filled 2023-02-07: qty 1

## 2023-02-07 MED ORDER — INSULIN ASPART 100 UNIT/ML IJ SOLN
0.0000 [IU] | INTRAMUSCULAR | Status: DC
  Administered 2023-02-07: 5 [IU] via SUBCUTANEOUS
  Administered 2023-02-07 (×2): 2 [IU] via SUBCUTANEOUS
  Filled 2023-02-07 (×4): qty 1

## 2023-02-07 NOTE — Progress Notes (Incomplete)
S:  Increased agitation. Reports not seeing a physician today and wants to know why he hasn't restarted home medication.  Would at least like his home BP and cholesterol meds.  Also reports new RX for Xanax 0.5 mg HS PRN sleep due to active separation/divorce stress.  Not on file.  B:  67 yo M with hx of DM, denies noncompliance with medication , pVD, HTN came in due to worsening Nausea and vomiting but denies loose stools.  He had Kyphoplasty and was dcd 01/14/23. He was on narcotics last month and stopped this treatment on his own due to feeling abnormal.  He did start new therapy with tramadol and notes worsening symptoms in parallel with this new medication.   Diabetic Ketoacidosis    Continue insulin drip with goal to improve blood glucose by 60-80 per hour      Currently at 19units per hour  - follow phase 1-3 DKA protocol   - patient with large anion gap still with BMP HCO3 <7     Circulatory shock -use vasopressors to keep MAP>65- currently on levophed  -follow ABG and LA -follow up cultures -emperic ABX- Levofloxacin , patient has PCN allergy -consider stress dose steroids     Renal Failure-most likely due to transient hypotension with ischemia -follow chem 7 -follow UO -continue Foley Catheter-assess need daily     ID -continue IV abx as prescibed -follow up cultures     2/16: did well overnight. Gap closed. Transitioned off insulin gtt. No pressors.   A:  VS 161/81 MAP 103, HR 93, RR 20, temp 98.2 oral.  Verbally voices frustration (noted above) Home meds metoprolol succinate 25 mg 24hr tablet; omeprazole 62m; rosuvastatin 14m new xanax prescription (mentioned above not on current file)  R: please advise

## 2023-02-07 NOTE — Inpatient Diabetes Management (Signed)
Inpatient Diabetes Program Recommendations  AACE/ADA: New Consensus Statement on Inpatient Glycemic Control (2015)  Target Ranges:  Prepandial:   less than 140 mg/dL      Peak postprandial:   less than 180 mg/dL (1-2 hours)      Critically ill patients:  140 - 180 mg/dL   Lab Results  Component Value Date   GLUCAP 140 (H) 02/07/2023   HGBA1C 6.9 (H) 01/06/2023    Latest Reference Range & Units 02/07/23 05:52  CO2 22 - 32 mmol/L 19 (L)  (L): Data is abnormally low  Latest Reference Range & Units 02/07/23 05:52  Anion gap 5 - 15  15    Diabetes history: DM Outpatient Diabetes medications: 70/30 30 units BID, Glyambi 25-5 mg QAM Current orders for Inpatient glycemic control: Semglee 14 units q hs, Novolog 0-15 units correction tid and hs  Inpatient Diabetes Program Recommendations:   While patient is NPO, please consider: -Change Novolog correction to 0-9 units q 4 hrs.  Thank you, Nani Gasser. Cassey Hurrell, RN, MSN, CDE  Diabetes Coordinator Inpatient Glycemic Control Team Team Pager [phone] (8am-5pm) 02/07/2023 9:12 AM

## 2023-02-07 NOTE — Progress Notes (Signed)
Report given to receiving nurse Brandi, cardiac and oxygen monitoring discontinued per Dr. Gwendalyn Ege.

## 2023-02-07 NOTE — Progress Notes (Signed)
*  PRELIMINARY RESULTS* Echocardiogram 2D Echocardiogram has been performed.  Shawn Daniels 02/07/2023, 11:31 AM

## 2023-02-07 NOTE — Progress Notes (Signed)
PHARMACY - PHYSICIAN COMMUNICATION CRITICAL VALUE ALERT - BLOOD CULTURE IDENTIFICATION (BCID)  Shawn Daniels is an 67 y.o. male who presented to [facility] on 02/06/2023 with a chief complaint of worsening nausea and vomiting.  Assessment:  1/4 bottles (aerobic) growing GNR.  No BCID result.  Name of physician (or Provider) Contacted: Rufina Falco, NP  Current antibiotics: None  Changes to prescribed antibiotics recommended:  Patient has allergy to cephalosporins and penicillins.  Will start meropenem.  Results for orders placed or performed during the hospital encounter of 02/06/23  Blood Culture ID Panel (Reflexed) (Collected: 02/06/2023  9:47 AM)  Result Value Ref Range   Enterococcus faecalis NOT DETECTED NOT DETECTED   Enterococcus Faecium NOT DETECTED NOT DETECTED   Listeria monocytogenes NOT DETECTED NOT DETECTED   Staphylococcus species NOT DETECTED NOT DETECTED   Staphylococcus aureus (BCID) NOT DETECTED NOT DETECTED   Staphylococcus epidermidis NOT DETECTED NOT DETECTED   Staphylococcus lugdunensis NOT DETECTED NOT DETECTED   Streptococcus species NOT DETECTED NOT DETECTED   Streptococcus agalactiae NOT DETECTED NOT DETECTED   Streptococcus pneumoniae NOT DETECTED NOT DETECTED   Streptococcus pyogenes NOT DETECTED NOT DETECTED   A.calcoaceticus-baumannii NOT DETECTED NOT DETECTED   Bacteroides fragilis NOT DETECTED NOT DETECTED   Enterobacterales NOT DETECTED NOT DETECTED   Enterobacter cloacae complex NOT DETECTED NOT DETECTED   Escherichia coli NOT DETECTED NOT DETECTED   Klebsiella aerogenes NOT DETECTED NOT DETECTED   Klebsiella oxytoca NOT DETECTED NOT DETECTED   Klebsiella pneumoniae NOT DETECTED NOT DETECTED   Proteus species NOT DETECTED NOT DETECTED   Salmonella species NOT DETECTED NOT DETECTED   Serratia marcescens NOT DETECTED NOT DETECTED   Haemophilus influenzae NOT DETECTED NOT DETECTED   Neisseria meningitidis NOT DETECTED NOT DETECTED    Pseudomonas aeruginosa NOT DETECTED NOT DETECTED   Stenotrophomonas maltophilia NOT DETECTED NOT DETECTED   Candida albicans NOT DETECTED NOT DETECTED   Candida auris NOT DETECTED NOT DETECTED   Candida glabrata NOT DETECTED NOT DETECTED   Candida krusei NOT DETECTED NOT DETECTED   Candida parapsilosis NOT DETECTED NOT DETECTED   Candida tropicalis NOT DETECTED NOT DETECTED   Cryptococcus neoformans/gattii NOT DETECTED NOT DETECTED    Lorin Picket, PharmD 02/07/2023  8:33 PM

## 2023-02-07 NOTE — Progress Notes (Signed)
Per Dr. Gwendalyn Ege, may start patient on carb mod diet.

## 2023-02-07 NOTE — Progress Notes (Signed)
Pharmacy Antibiotic Note  Shawn Daniels is a 67 y.o. male w/ h/O PVD, HTN admitted on 02/06/2023 with shock. Pharmacy has been consulted for levofloxacin dosing.  Allergy to penicillins from childhood as well as allergy to cephalexin with skin reaction.  Plan: Continue levofloxacin IV 750 mg every 24 hours   Continue to monitor renal function and clinical course.  Height: 6' 1"$  (185.4 cm) Weight: 85.8 kg (189 lb 2.5 oz) IBW/kg (Calculated) : 79.9  Temp (24hrs), Avg:98.2 F (36.8 C), Min:97.1 F (36.2 C), Max:98.5 F (36.9 C)  Recent Labs  Lab 02/06/23 0834 02/06/23 1058 02/06/23 1102 02/06/23 1535 02/06/23 1924 02/07/23 0018 02/07/23 0552  WBC 9.6  --   --   --   --   --  7.2  CREATININE 1.73*  --  1.70* 1.30* 1.20 1.00 0.88  LATICACIDVEN 3.5* 3.8*  --   --   --  1.5  --      Estimated Creatinine Clearance: 94.6 mL/min (by C-G formula based on SCr of 0.88 mg/dL).    Allergies  Allergen Reactions   Codeine Itching and Other (See Comments)    Can take/tolerate in lower doses   Keflex [Cephalexin] Other (See Comments)    "Shriveled my skin"   Penicillins Other (See Comments)    Reaction from childhood not recalled    Antimicrobials this admission: levofloxacin 2/15 >>   Microbiology results: 2/15 BCx: NG < 24 hours 2/15 MRSA PCR: negative 2/15 Resp panel: negative  Thank you for allowing pharmacy to be a part of this patient's care.  Glean Salvo, PharmD, BCPS Clinical Pharmacist  02/07/2023 8:00 AM

## 2023-02-07 NOTE — Progress Notes (Signed)
CRITICAL CARE PROGRESS NOTE    Name: Shawn Daniels MRN: [id number] DOB: 09/17/1956     LOS: 1   SUBJECTIVE FINDINGS & SIGNIFICANT EVENTS    History of presenting illness:  67 yo M with hx of DM, denies noncompliance with medication , pVD, HTN came in due to worsening Nausea and vomiting but denies loose stools.  He had Kyphoplasty and was dcd 01/14/23. He was on narcotics last month and stopped this treatment on his own due to feeling abnormal.  He did start new therapy with tramadol and notes worsening symptoms in parallel with this new medication. He denies noncompliance with medication.  He denies sick contacts or travel.   2/16: did well overnight. Gap closed. Transitioned off insulin gtt. No pressors.   Lines/tubes :   Microbiology/Sepsis markers: Results for orders placed or performed during the hospital encounter of 02/06/23  Resp panel by RT-PCR (RSV, Flu A&B, Covid) Anterior Nasal Swab     Status: None   Collection Time: 02/06/23  8:34 AM   Specimen: Anterior Nasal Swab  Result Value Ref Range Status   SARS Coronavirus 2 by RT PCR NEGATIVE NEGATIVE Final    Comment: (NOTE) SARS-CoV-2 target nucleic acids are NOT DETECTED.  The SARS-CoV-2 RNA is generally detectable in upper respiratory specimens during the acute phase of infection. The lowest concentration of SARS-CoV-2 viral copies this assay can detect is 138 copies/mL. A negative result does not preclude SARS-Cov-2 infection and should not be used as the sole basis for treatment or other patient management decisions. A negative result may occur with  improper specimen collection/handling, submission of specimen other than nasopharyngeal swab, presence of viral mutation(s) within the areas targeted by this assay, and inadequate number of  viral copies(<138 copies/mL). A negative result must be combined with clinical observations, patient history, and epidemiological information. The expected result is Negative.  Fact Sheet for Patients:  EntrepreneurPulse.com.au  Fact Sheet for Healthcare Providers:  IncredibleEmployment.be  This test is no t yet approved or cleared by the [place] FDA and  has been authorized for detection and/or diagnosis of SARS-CoV-2 by FDA under an Emergency Use Authorization (EUA). This EUA will remain  in effect (meaning this test can be used) for the duration of the COVID-19 declaration under Section 564(b)(1) of the Act, 21 U.S.C.section 360bbb-3(b)(1), unless the authorization is terminated  or revoked sooner.       Influenza A by PCR NEGATIVE NEGATIVE Final   Influenza B by PCR NEGATIVE NEGATIVE Final    Comment: (NOTE) The Xpert Xpress SARS-CoV-2/FLU/RSV plus assay is intended as an aid in the diagnosis of influenza from Nasopharyngeal swab specimens and should not be used as a sole basis for treatment. Nasal washings and aspirates are unacceptable for Xpert Xpress SARS-CoV-2/FLU/RSV testing.  Fact Sheet for Patients: EntrepreneurPulse.com.au  Fact Sheet for Healthcare Providers: IncredibleEmployment.be  This test is not yet approved or cleared by the [place] FDA and has been authorized for detection and/or diagnosis of SARS-CoV-2 by FDA under an Emergency Use Authorization (EUA). This EUA will remain in effect (meaning this test can be used) for the duration of the COVID-19 declaration under Section 564(b)(1) of the Act, 21 U.S.C. section 360bbb-3(b)(1), unless the authorization is terminated or revoked.     Resp Syncytial Virus by PCR NEGATIVE NEGATIVE Final    Comment: (NOTE) Fact Sheet for Patients: EntrepreneurPulse.com.au  Fact Sheet for Healthcare  Providers: IncredibleEmployment.be  This test is not yet approved or cleared by  the [employer] and has been authorized for detection and/or diagnosis of SARS-CoV-2 by FDA under an Emergency Use Authorization (EUA). This EUA will remain in effect (meaning this test can be used) for the duration of the COVID-19 declaration under Section 564(b)(1) of the Act, 21 U.S.C. section 360bbb-3(b)(1), unless the authorization is terminated or revoked.  Performed at [facility], [address] [place], [place] [address]   Culture, blood (routine x 2)     Status: None (Preliminary result)   Collection Time: 02/06/23  8:34 AM   Specimen: BLOOD RIGHT ARM  Result Value Ref Range Status   Specimen Description BLOOD RIGHT ARM  Final   Special Requests   Final    BOTTLES DRAWN AEROBIC AND ANAEROBIC Blood Culture adequate volume   Culture   Final    NO GROWTH < 24 HOURS Performed at [facility], [address] [place], [place] [address]    Report Status PENDING  Incomplete  Culture, blood (routine x 2)     Status: None (Preliminary result)   Collection Time: 02/06/23  9:47 AM   Specimen: BLOOD  Result Value Ref Range Status   Specimen Description BLOOD BLOOD LEFT FOREARM  Final   Special Requests   Final    BOTTLES DRAWN AEROBIC AND ANAEROBIC Blood Culture results may not be optimal due to an inadequate volume of blood received in culture bottles   Culture   Final    NO GROWTH < 24 HOURS Performed at [facility], [address] [place], [place] [address]    Report Status PENDING  Incomplete  MRSA Next Gen by PCR, Nasal     Status: None   Collection Time: 02/06/23  3:01 PM   Specimen: Nasal Mucosa; Nasal Swab  Result Value Ref Range Status   MRSA by PCR Next Gen NOT DETECTED NOT DETECTED Final    Comment: (NOTE) The GeneXpert MRSA Assay (FDA approved for NASAL specimens only), is one component of a comprehensive MRSA colonization  surveillance program. It is not intended to diagnose MRSA infection nor to guide or monitor treatment for MRSA infections. Test performance is not FDA approved in patients less than 67 years old. Performed at [facility], [address], [place],  [address]     Anti-infectives:  Anti-infectives (From admission, onward)    Start     Dose/Rate Route Frequency Ordered Stop   02/06/23 1800  levofloxacin (LEVAQUIN) IVPB 750 mg  Status:  Discontinued        750 mg 100 mL/hr over 90 Minutes Intravenous Every 24 hours 02/06/23 1635 02/07/23 1124   02/06/23 1200  levofloxacin (LEVAQUIN) IVPB 750 mg  Status:  Discontinued        750 mg 100 mL/hr over 90 Minutes Intravenous Daily 02/06/23 1140 02/06/23 1157   02/06/23 1200  levofloxacin (LEVAQUIN) IVPB 750 mg  Status:  Discontinued        750 mg 100 mL/hr over 90 Minutes Intravenous Every 48 hours 02/06/23 1157 02/06/23 1635         PAST MEDICAL HISTORY   Past Medical History:  Diagnosis Date   Diabetes mellitus without complication ([place])    GERD (gastroesophageal reflux disease)    Hypertension    Peripheral vascular disease ([place])      SURGICAL HISTORY   Past Surgical History:  Procedure Laterality Date   ABDOMINAL AORTOGRAM W/LOWER EXTREMITY Right 08/20/2022   Procedure: ABDOMINAL AORTOGRAM W/LOWER EXTREMITY;  Surgeon: Serafina Mitchell,  MD;  Location: [place] [facility];  Service: Cardiovascular;  Laterality: Right;   ABDOMINAL AORTOGRAM W/LOWER EXTREMITY N/A 11/19/2022   Procedure: ABDOMINAL AORTOGRAM W/LOWER EXTREMITY;  Surgeon: Serafina Mitchell, MD;  Location: [place] [facility];  Service: Cardiovascular;  Laterality: N/A;   ENDARTERECTOMY FEMORAL Right 04/26/2022   Procedure: RIGHT FEMORAL ENDARTERECTOMY;  Surgeon: Serafina Mitchell, MD;  Location: [place];  Service: Vascular;  Laterality: Right;   INTRAVASCULAR LITHOTRIPSY Left 11/19/2022   Procedure: INTRAVASCULAR LITHOTRIPSY;  Surgeon: Serafina Mitchell, MD;   Location: [facility];  Service: Cardiovascular;  Laterality: Left;   IR KYPHO LUMBAR INC FX REDUCE BONE BX UNI/BIL CANNULATION INC/IMAGING  01/13/2023   IR RADIOLOGIST EVAL & MGMT  01/29/2023   KNEE SURGERY     LOWER EXTREMITY ANGIOGRAPHY N/A 01/01/2022   Procedure: LOWER EXTREMITY ANGIOGRAPHY;  Surgeon: Nigel Mormon, MD;  Location: [place];  Service: Cardiovascular;  Laterality: N/A;   PATCH ANGIOPLASTY Right 04/26/2022   Procedure: PATCH ANGIOPLASTY OF RIGHT FEMORAL ARTERY USING XENOSURE BOVINE [place];  Surgeon: Serafina Mitchell, MD;  Location: [facility];  Service: Vascular;  Laterality: Right;   PENILE PROSTHESIS IMPLANT     PERIPHERAL VASCULAR ATHERECTOMY  08/20/2022   Procedure: PERIPHERAL VASCULAR ATHERECTOMY;  Surgeon: Serafina Mitchell, MD;  Location: [place] [facility];  Service: Cardiovascular;;  popliteal   PERIPHERAL VASCULAR BALLOON ANGIOPLASTY  01/08/2022   Procedure: PERIPHERAL VASCULAR BALLOON ANGIOPLASTY;  Surgeon: Adrian Prows, MD;  Location: [facility];  Service: Cardiovascular;;   PERIPHERAL VASCULAR INTERVENTION Left 11/19/2022   Procedure: PERIPHERAL VASCULAR INTERVENTION;  Surgeon: Serafina Mitchell, MD;  Location: [facility];  Service: Cardiovascular;  Laterality: Left;   WRIST SURGERY       FAMILY HISTORY   Family History  Problem Relation Age of Onset   Heart failure Father      SOCIAL HISTORY   Social History   Tobacco Use   Smoking status: Former    Packs/day: 1.50    Years: 30.00    Total pack years: 45.00    Types: Cigarettes, Cigars    Quit date: 2009    Years since quitting: 15.1   Smokeless tobacco: Never  Vaping Use   Vaping Use: Never used  Substance Use Topics   Alcohol use: Yes    Alcohol/week: 3.0 standard drinks of alcohol    Types: 3 Cans of beer per week    Comment: daily   Drug use: Never     MEDICATIONS   Current Medication:  Current Facility-Administered Medications:    0.9 %   sodium chloride infusion, 250 mL, Intravenous, Continuous, Aleskerov, Fuad, MD, Last Rate: 10 mL/hr at 02/07/23 1200, Infusion Verify at 02/07/23 1200   albuterol (PROVENTIL) (2.5 MG/3ML) 0.083% nebulizer solution 2.5 mg, 2.5 mg, Nebulization, Q2H PRN, Ottie Glazier, MD   aspirin chewable tablet 81 mg, 81 mg, Oral, Daily, Wynelle Cleveland, RPH, 81 mg at 02/07/23 1610   Chlorhexidine Gluconate Cloth 2 % PADS 6 each, 6 each, Topical, Daily, Aleskerov, Fuad, MD, 6 each at 02/06/23 1524   clopidogrel (PLAVIX) tablet 75 mg, 75 mg, Oral, Daily, Wynelle Cleveland, RPH, 75 mg at 02/07/23 1610   dextrose 50 % solution 0-50 mL, 0-50 mL, Intravenous, PRN, Arta Silence, MD   docusate sodium (COLACE) capsule 100 mg, 100 mg, Oral, BID PRN, Ottie Glazier, MD   fenofibrate tablet 160 mg, 160 mg, Oral, Daily, Wynelle Cleveland,  RPH   insulin aspart (novoLOG) injection 0-9 Units, 0-9 Units, Subcutaneous, Q4H, Laderrick Wilk, Earley Abide, MD, 2 Units at 02/07/23 1232   insulin glargine-yfgn (SEMGLEE) injection 14 Units, 14 Units, Subcutaneous, QHS, Stiles Maxcy, Earley Abide, MD   ondansetron [facility]) injection 4 mg, 4 mg, Intravenous, Q6H PRN, Ottie Glazier, MD, 4 mg at 02/06/23 1457   polyethylene glycol (MIRALAX / GLYCOLAX) packet 17 g, 17 g, Oral, Daily PRN, Ottie Glazier, MD  Facility-Administered Medications Ordered in Other Encounters:    iodixanol (VISIPAQUE) 320 MG/ML injection, , , PRN, Serafina Mitchell, MD, 120 mL at 08/20/22 1145    ALLERGIES   Codeine, Keflex [cephalexin], and Penicillins    REVIEW OF SYSTEMS    10 point ROS done and is negative except as per HPI  PHYSICAL EXAMINATION   Vital Signs: Temp:  [97.9 F (36.6 C)-98.5 F (36.9 C)] 98.1 F (36.7 C) (02/16 1600) Pulse Rate:  [91-118] 93 (02/16 1600) Resp:  [13-26] 16 (02/16 1600) BP: (103-167)/(56-77) 133/67 (02/16 1600) SpO2:  [91 %-100 %] 100 % (02/16 1600) Weight:  [85.8 kg] 85.8 kg (02/16 0500)  GENERAL:Age appropriate moderate  distress HEAD: Normocephalic, atraumatic.  EYES: Pupils equal, round, reactive to light.  No scleral icterus.  MOUTH: Moist mucosal membrane. NECK: Supple. No thyromegaly. No nodules. No JVD.  PULMONARY: rhonchi bilaterally CARDIOVASCULAR: S1 and S2. Regular rate and rhythm. No murmurs, rubs, or gallops.  GASTROINTESTINAL: Soft, nontender, non-distended. No masses. Positive bowel sounds. No hepatosplenomegaly.  MUSCULOSKELETAL: No swelling, clubbing, or edema.  NEUROLOGIC: Mild to moderate distress due to acute illness SKIN:intact,warm,dry   PERTINENT DATA     Infusions:  sodium chloride 10 mL/hr at 02/07/23 1200   Scheduled Medications:  aspirin  81 mg Oral Daily   Chlorhexidine Gluconate Cloth  6 each Topical Daily   clopidogrel  75 mg Oral Daily   fenofibrate  160 mg Oral Daily   insulin aspart  0-9 Units Subcutaneous Q4H   insulin glargine-yfgn  14 Units Subcutaneous QHS   PRN Medications: albuterol, dextrose, docusate sodium, ondansetron (ZOFRAN) IV, polyethylene glycol Hemodynamic parameters:   Intake/Output: 02/15 0701 - 02/16 0700 In: 4145.6 [I.V.:1995.6; IV Piggyback:2150] Out: 2550 [Urine:2550]  Ventilator  Settings:    LAB RESULTS:  Basic Metabolic Panel: Recent Labs  Lab 02/06/23 1102 02/06/23 1535 02/06/23 1924 02/07/23 0018 02/07/23 0552  NA 133* 128* 129* 134* 134*  K 5.0 4.1 4.4 4.8 4.0  CL 99 97* 94* 100 100  CO2 <7* 10* 15* 20* 19*  GLUCOSE 283* 962* 134* 137* 115*  BUN 31* 23 24* 18 15  CREATININE 1.70* 1.30* 1.20 1.00 0.88  CALCIUM 8.3* 7.7* 8.5* 9.0 8.7*  MG  --   --  2.1  --  2.1  PHOS  --   --  1.9*  --  2.7   Liver Function Tests: Recent Labs  Lab 02/06/23 0834 02/07/23 0552  AST 46* 125*  ALT 39 95*  ALKPHOS 72 52  BILITOT 2.4* 1.6*  PROT 8.0 6.2*  ALBUMIN 4.4 3.5   No results for input(s): "LIPASE", "AMYLASE" in the last 168 hours. No results for input(s): "AMMONIA" in the last 168 hours. CBC: Recent Labs  Lab  02/06/23 0834 02/07/23 0552  WBC 9.6 7.2  NEUTROABS 8.2*  --   HGB 13.0 11.8*  HCT 40.7 33.4*  MCV 100.5* 92.3  PLT 154 107*   Cardiac Enzymes: No results for input(s): "CKTOTAL", "CKMB", "CKMBINDEX", "TROPONINI" in the last 168 hours. BNP: Invalid input(s): "  POCBNP" CBG: Recent Labs  Lab 02/07/23 0307 02/07/23 0416 02/07/23 0721 02/07/23 1134 02/07/23 1602  GLUCAP 119* 130* 140* 152* 116*       IMAGING RESULTS:  Imaging: ECHOCARDIOGRAM COMPLETE  Result Date: 02/07/2023    ECHOCARDIOGRAM REPORT   Patient Name:   Brayton Layman Date of Exam: 02/07/2023 Medical Rec #:  [id number]          Height:       73.0 in Accession #:    [id number]         Weight:       189.2 lb Date of Birth:  21-Dec-1956         BSA:          2.101 m Patient Age:    2 years           BP:           149/70 mmHg Patient Gender: M                  HR:           102 bpm. Exam Location:  [facility] Procedure: 2D Echo, Cardiac Doppler and Color Doppler Indications:     CHF  History:         Patient has no prior history of Echocardiogram examinations.                  CHF, PAD, Arrythmias:Tachycardia; Risk Factors:Hypertension,                  Diabetes and Dyslipidemia.  Sonographer:     Wenda Low Referring Phys:  [id number] [place] Diagnosing Phys: [place]  1. Left ventricular ejection fraction, by estimation, is 60 to 65%. The left ventricle has normal function. The left ventricle has no regional wall motion abnormalities. There is mild concentric left ventricular hypertrophy. Left ventricular diastolic parameters are consistent with Grade I diastolic dysfunction (impaired relaxation).  2. Right ventricular systolic function is normal. The right ventricular size is normal.  3. Left atrial size was mildly dilated.  4. Right atrial size was mildly dilated.  5. The mitral valve is normal in structure. Trivial mitral valve regurgitation. No evidence of mitral stenosis.  6. The aortic valve is  normal in structure. Aortic valve regurgitation is not visualized. No aortic stenosis is present.  7. The inferior vena cava is normal in size with greater than 50% respiratory variability, suggesting right atrial pressure of 3 mmHg. FINDINGS  Left Ventricle: Left ventricular ejection fraction, by estimation, is 60 to 65%. The left ventricle has normal function. The left ventricle has no regional wall motion abnormalities. The left ventricular internal cavity size was normal in size. There is  mild concentric left ventricular hypertrophy. Left ventricular diastolic parameters are consistent with Grade I diastolic dysfunction (impaired relaxation). Right Ventricle: The right ventricular size is normal. No increase in right ventricular wall thickness. Right ventricular systolic function is normal. Left Atrium: Left atrial size was mildly dilated. Right Atrium: Right atrial size was mildly dilated. Pericardium: There is no evidence of pericardial effusion. Mitral Valve: The mitral valve is normal in structure. Trivial mitral valve regurgitation. No evidence of mitral valve stenosis. MV peak gradient, 3.7 mmHg. The mean mitral valve gradient is 1.0 mmHg. Tricuspid Valve: The tricuspid valve is normal in structure. Tricuspid valve regurgitation is trivial. No evidence of tricuspid stenosis. Aortic Valve: The aortic valve is normal in structure. Aortic valve regurgitation is not visualized. No aortic  stenosis is present. Aortic valve mean gradient measures 4.0 mmHg. Aortic valve peak gradient measures 6.5 mmHg. Aortic valve area, by VTI measures 2.94 cm. Pulmonic Valve: The pulmonic valve was normal in structure. Pulmonic valve regurgitation is not visualized. No evidence of pulmonic stenosis. Aorta: The aortic root is normal in size and structure. Venous: The inferior vena cava is normal in size with greater than 50% respiratory variability, suggesting right atrial pressure of 3 mmHg. IAS/Shunts: No atrial level shunt  detected by color flow Doppler.  LEFT VENTRICLE PLAX 2D LVIDd:         4.90 cm   Diastology LVIDs:         3.20 cm   LV e' medial:    7.83 cm/s LV PW:         1.10 cm   LV E/e' medial:  7.3 LV IVS:        1.00 cm   LV e' lateral:   10.90 cm/s LVOT diam:     2.20 cm   LV E/e' lateral: 5.2 LV SV:         75 LV SV Index:   35 LVOT Area:     3.80 cm  RIGHT VENTRICLE RV Basal diam:  3.35 cm RV Mid diam:    3.50 cm RV S prime:     17.70 cm/s TAPSE (M-mode): 1.9 cm LEFT ATRIUM             Index        RIGHT ATRIUM           Index LA diam:        4.20 cm 2.00 cm/m   RA Area:     19.40 cm LA Vol (A2C):   42.9 ml 20.42 ml/m  RA Volume:   57.60 ml  27.41 ml/m LA Vol (A4C):   42.5 ml 20.23 ml/m LA Biplane Vol: 43.7 ml 20.80 ml/m  AORTIC VALVE                    PULMONIC VALVE AV Area (Vmax):    3.14 cm     PV Vmax:       1.11 m/s AV Area (Vmean):   2.77 cm     PV Peak grad:  4.9 mmHg AV Area (VTI):     2.94 cm AV Vmax:           127.00 cm/s AV Vmean:          90.100 cm/s AV VTI:            0.253 m AV Peak Grad:      6.5 mmHg AV Mean Grad:      4.0 mmHg LVOT Vmax:         105.00 cm/s LVOT Vmean:        65.600 cm/s LVOT VTI:          0.196 m LVOT/AV VTI ratio: 0.77  AORTA Ao Root diam: 3.80 cm MITRAL VALVE MV Area (PHT): 3.43 cm    SHUNTS MV Area VTI:   3.76 cm    Systemic VTI:  0.20 m MV Peak grad:  3.7 mmHg    Systemic Diam: 2.20 cm MV Mean grad:  1.0 mmHg MV Vmax:       0.96 m/s MV Vmean:      55.4 cm/s MV Decel Time: 221 msec MV E velocity: 57.10 cm/s MV A velocity: 82.90 cm/s MV E/A ratio:  0.69 [employer] signed by [employer]  Vernetta Honey Date/Time: 02/07/2023/12:03:02 PM    Final    DG Chest Port 1 View  Result Date: 02/06/2023 CLINICAL DATA:  Weakness. EXAM: PORTABLE CHEST 1 VIEW COMPARISON:  01/05/2023 FINDINGS: Prominent lung markings are similar to the previous examination. No evidence for acute airspace disease or pulmonary edema. Heart and mediastinum are within normal limits. Trachea  is midline. Old right rib fractures. Negative for a pneumothorax. IMPRESSION: No acute cardiopulmonary disease. Electronically Signed   By: Markus Daft M.D.   On: 02/06/2023 08:34   @PROBHOSP$ @ ECHOCARDIOGRAM COMPLETE  Result Date: 02/07/2023    ECHOCARDIOGRAM REPORT   Patient Name:   Brayton Layman Date of Exam: 02/07/2023 Medical Rec #:  [id number]          Height:       73.0 in Accession #:    [id number]         Weight:       189.2 lb Date of Birth:  03/31/1956         BSA:          2.101 m Patient Age:    49 years           BP:           149/70 mmHg Patient Gender: M                  HR:           102 bpm. Exam Location:  [facility] Procedure: 2D Echo, Cardiac Doppler and Color Doppler Indications:     CHF  History:         Patient has no prior history of Echocardiogram examinations.                  CHF, PAD, Arrythmias:Tachycardia; Risk Factors:Hypertension,                  Diabetes and Dyslipidemia.  Sonographer:     Wenda Low Referring Phys:  [id number] [place] Diagnosing Phys: [place]  1. Left ventricular ejection fraction, by estimation, is 60 to 65%. The left ventricle has normal function. The left ventricle has no regional wall motion abnormalities. There is mild concentric left ventricular hypertrophy. Left ventricular diastolic parameters are consistent with Grade I diastolic dysfunction (impaired relaxation).  2. Right ventricular systolic function is normal. The right ventricular size is normal.  3. Left atrial size was mildly dilated.  4. Right atrial size was mildly dilated.  5. The mitral valve is normal in structure. Trivial mitral valve regurgitation. No evidence of mitral stenosis.  6. The aortic valve is normal in structure. Aortic valve regurgitation is not visualized. No aortic stenosis is present.  7. The inferior vena cava is normal in size with greater than 50% respiratory variability, suggesting right atrial pressure of 3 mmHg. FINDINGS  Left Ventricle: Left  ventricular ejection fraction, by estimation, is 60 to 65%. The left ventricle has normal function. The left ventricle has no regional wall motion abnormalities. The left ventricular internal cavity size was normal in size. There is  mild concentric left ventricular hypertrophy. Left ventricular diastolic parameters are consistent with Grade I diastolic dysfunction (impaired relaxation). Right Ventricle: The right ventricular size is normal. No increase in right ventricular wall thickness. Right ventricular systolic function is normal. Left Atrium: Left atrial size was mildly dilated. Right Atrium: Right atrial size was mildly dilated. Pericardium: There is no evidence of pericardial effusion. Mitral Valve: The mitral valve is normal in  structure. Trivial mitral valve regurgitation. No evidence of mitral valve stenosis. MV peak gradient, 3.7 mmHg. The mean mitral valve gradient is 1.0 mmHg. Tricuspid Valve: The tricuspid valve is normal in structure. Tricuspid valve regurgitation is trivial. No evidence of tricuspid stenosis. Aortic Valve: The aortic valve is normal in structure. Aortic valve regurgitation is not visualized. No aortic stenosis is present. Aortic valve mean gradient measures 4.0 mmHg. Aortic valve peak gradient measures 6.5 mmHg. Aortic valve area, by VTI measures 2.94 cm. Pulmonic Valve: The pulmonic valve was normal in structure. Pulmonic valve regurgitation is not visualized. No evidence of pulmonic stenosis. Aorta: The aortic root is normal in size and structure. Venous: The inferior vena cava is normal in size with greater than 50% respiratory variability, suggesting right atrial pressure of 3 mmHg. IAS/Shunts: No atrial level shunt detected by color flow Doppler.  LEFT VENTRICLE PLAX 2D LVIDd:         4.90 cm   Diastology LVIDs:         3.20 cm   LV e' medial:    7.83 cm/s LV PW:         1.10 cm   LV E/e' medial:  7.3 LV IVS:        1.00 cm   LV e' lateral:   10.90 cm/s LVOT diam:     2.20 cm    LV E/e' lateral: 5.2 LV SV:         75 LV SV Index:   35 LVOT Area:     3.80 cm  RIGHT VENTRICLE RV Basal diam:  3.35 cm RV Mid diam:    3.50 cm RV S prime:     17.70 cm/s TAPSE (M-mode): 1.9 cm LEFT ATRIUM             Index        RIGHT ATRIUM           Index LA diam:        4.20 cm 2.00 cm/m   RA Area:     19.40 cm LA Vol (A2C):   42.9 ml 20.42 ml/m  RA Volume:   57.60 ml  27.41 ml/m LA Vol (A4C):   42.5 ml 20.23 ml/m LA Biplane Vol: 43.7 ml 20.80 ml/m  AORTIC VALVE                    PULMONIC VALVE AV Area (Vmax):    3.14 cm     PV Vmax:       1.11 m/s AV Area (Vmean):   2.77 cm     PV Peak grad:  4.9 mmHg AV Area (VTI):     2.94 cm AV Vmax:           127.00 cm/s AV Vmean:          90.100 cm/s AV VTI:            0.253 m AV Peak Grad:      6.5 mmHg AV Mean Grad:      4.0 mmHg LVOT Vmax:         105.00 cm/s LVOT Vmean:        65.600 cm/s LVOT VTI:          0.196 m LVOT/AV VTI ratio: 0.77  AORTA Ao Root diam: 3.80 cm MITRAL VALVE MV Area (PHT): 3.43 cm    SHUNTS MV Area VTI:   3.76 cm    Systemic VTI:  0.20 m MV Peak grad:  3.7 mmHg    Systemic Diam: 2.20 cm MV Mean grad:  1.0 mmHg MV Vmax:       0.96 m/s MV Vmean:      55.4 cm/s MV Decel Time: 221 msec MV E velocity: 57.10 cm/s MV A velocity: 82.90 cm/s MV E/A ratio:  0.69 Shau[employer] Electronically signed by Neoma Laming Signature Date/Time: 02/07/2023/12:03:02 PM    Final      ASSESSMENT AND PLAN    -Multidisciplinary rounds held today  Diabetic Ketoacidosis  Resolved -on scheduled subcutaneous insulin  -on SSI  Circulatory shock -resolved  Renal Failure-most likely due to transient hypotension with ischemia -follow chem 7 -follow UO -continue Foley Catheter-assess need daily  ID -no evidence of infection -abx stopped  GI/Nutrition -tolerating carb mod diet  ENDO - ICU hypoglycemic\Hyperglycemia protocol -diabetic coordinator -check FSBS per protocol  ELECTROLYTES -follow labs as needed -replace as needed -pharmacy  consultation  DVT/GI PRX ordered -SCDs  TRANSFUSIONS AS NEEDED MONITOR FSBS ASSESS the need for LABS as needed  Critical care provider statement:   Total critical care time: 40 minutes   Arcelia Jew MD [place]

## 2023-02-07 NOTE — Progress Notes (Signed)
Patient transferred to new room in hospital bed in stable condition with all belongings.

## 2023-02-07 NOTE — Plan of Care (Signed)
Continuing with plan of care. 

## 2023-02-07 NOTE — Progress Notes (Signed)
PHARMACY CONSULT NOTE - FOLLOW UP  Pharmacy Consult for Electrolyte Monitoring and Replacement   Recent Labs: Potassium (mmol/L)  Date Value  02/07/2023 4.0   Magnesium (mg/dL)  Date Value  02/07/2023 2.1   Calcium (mg/dL)  Date Value  02/07/2023 8.7 (L)   Albumin (g/dL)  Date Value  02/07/2023 3.5  06/18/2022 4.8   Phosphorus (mg/dL)  Date Value  02/07/2023 2.7   Sodium (mmol/L)  Date Value  02/07/2023 134 (L)  12/26/2021 139    Assessment: 67 year old male PMH HTN, pVD admitted with shock and metabolic acidosis due to DKA. Transitioned off IV insulin to [place] insulin 2/15 PM. Anion gap down trending at 15.  Diet: NPO  Goal of Therapy:  Electrolytes within normal limits  Plan:  Phos 1.9 > 2.7 (lower end of normal). Continue Kphos PO neutral 500 mg every 4 hours. K + 4.0 after transitioning off insulin infusion. No replacement warranted. Follow up BMP and phosphorus tomorrow AM   Glean Salvo, PharmD, BCPS Clinical Pharmacist  02/07/2023 7:54 AM

## 2023-02-07 NOTE — TOC Initial Note (Signed)
Transition of Care [facility]) - Initial/Assessment Note    Patient Details  Name: Shawn Daniels MRN: [id number] Date of Birth: 1956/06/29  [facility]) CM/SW Contact:    Shelbie Hutching, RN Phone Number: 02/07/2023, 2:38 PM  Clinical Narrative:                   Transition of Care [facility]) Screening Note   Patient Details  Name: Shawn Daniels Date of Birth: [birth year]   Transition of Care [facility]) CM/SW Contact:    Shelbie Hutching, RN Phone Number: 02/07/2023, 2:38 PM    Transition of Care Department [facility]) has reviewed patient and no TOC needs have been identified at this time. We will continue to monitor patient advancement through interdisciplinary progression rounds. If new patient transition needs arise, please place a TOC consult.         Patient Goals and CMS Choice            Expected Discharge Plan and Services                                              Prior Living Arrangements/Services                       Activities of Daily Living Home Assistive Devices/Equipment: None ADL Screening (condition at time of admission) Patient's cognitive ability adequate to safely complete daily activities?: Yes Is the patient deaf or have difficulty hearing?: No Does the patient have difficulty seeing, even when wearing glasses/contacts?: No Does the patient have difficulty concentrating, remembering, or making decisions?: No Patient able to express need for assistance with ADLs?: Yes Does the patient have difficulty dressing or bathing?: No Independently performs ADLs?: Yes (appropriate for developmental age) Does the patient have difficulty walking or climbing stairs?: No Weakness of Legs: None Weakness of Arms/Hands: None  Permission Sought/Granted                  Emotional Assessment              Admission diagnosis:  Shock circulatory ([place]) [R57.9] Diabetic ketoacidosis without coma associated with type 2  diabetes mellitus ([place]) [E11.10] Patient Active Problem List   Diagnosis Date Noted   Shock circulatory ([place]) 02/06/2023   Lumbar compression fracture, closed, initial encounter ([facility]) 01/05/2023   Alcohol dependence ([place]) 01/05/2023   Sinus tachycardia 10/08/2022   PAD (peripheral artery disease) ([place]) 04/26/2022   Hypertension 04/25/2022   Insulin dependent diabetes mellitus type IA ([place]) 04/25/2022   HLD (hyperlipidemia) 04/03/2022   Claudication in peripheral vascular disease ([place]) 12/31/2021   Extensor tendon disruption 06/19/2020   Traumatic tear of supraspinatus tendon, right, initial encounter 12/02/2018   Rib contusion, right, initial encounter 12/02/2018   Trigger finger, acquired 06/29/2018   Mass of finger of left hand 11/01/2017   Closed fracture of first metacarpal bone of left hand with routine healing, subsequent encounter 09/21/2017   PCP:  Glenda Chroman, MD Pharmacy:   [facility], [place] [address] Phone: [phone] Fax: [phone]     Social Determinants of Health (SDOH) Social History: SDOH Screenings   Food Insecurity: No Food Insecurity (02/06/2023)  Housing: Low Risk  (02/06/2023)  Transportation Needs: No Transportation Needs (02/06/2023)  Utilities: Not  At Risk (02/06/2023)  Tobacco Use: Medium Risk (02/06/2023)   SDOH Interventions:     Readmission Risk Interventions     No data to display

## 2023-02-08 DIAGNOSIS — R579 Shock, unspecified: Secondary | ICD-10-CM | POA: Diagnosis not present

## 2023-02-08 LAB — GLUCOSE, CAPILLARY
Glucose-Capillary: 131 mg/dL — ABNORMAL HIGH (ref 70–99)
Glucose-Capillary: 69 mg/dL — ABNORMAL LOW (ref 70–99)
Glucose-Capillary: 119 mg/dL — ABNORMAL HIGH (ref 70–99)

## 2023-02-08 LAB — PHOSPHORUS: Phosphorus: 2.7 mg/dL (ref 2.5–4.6)

## 2023-02-08 LAB — BASIC METABOLIC PANEL
Anion gap: 14 (ref 5–15)
BUN: 9 mg/dL (ref 8–23)
CO2: 22 mmol/L (ref 22–32)
Calcium: 8.9 mg/dL (ref 8.9–10.3)
Chloride: 99 mmol/L (ref 98–111)
Creatinine, Ser: 0.69 mg/dL (ref 0.61–1.24)
GFR, Estimated: >60 mL/min (ref >60)
Glucose, Bld: 73 mg/dL (ref 70–99)
Potassium: 3.2 mmol/L — ABNORMAL LOW (ref 3.5–5.1)
Sodium: 135 mmol/L (ref 135–145)

## 2023-02-08 LAB — PROCALCITONIN: Procalcitonin: 0.12 ng/mL

## 2023-02-08 MED ORDER — POTASSIUM CHLORIDE CRYS ER 20 MEQ PO TBCR
40.0000 meq | EXTENDED_RELEASE_TABLET | Freq: Once | ORAL | Status: AC
  Administered 2023-02-08: 40 meq via ORAL
  Filled 2023-02-08: qty 2

## 2023-02-08 MED ORDER — CIPROFLOXACIN HCL 500 MG PO TABS: 500.0000 mg | ORAL_TABLET | Freq: Two times a day (BID) | ORAL | 0 refills | Status: AC

## 2023-02-08 MED ORDER — POTASSIUM CHLORIDE 20 MEQ PO PACK: 40.0000 meq | PACK | Freq: Once | ORAL | Status: DC

## 2023-02-08 MED ORDER — K PHOS MONO-SOD PHOS DI & MONO 155-852-130 MG PO TABS
500.0000 mg | ORAL_TABLET | ORAL | Status: DC
  Administered 2023-02-08: 500 mg via ORAL
  Filled 2023-02-08: qty 2

## 2023-02-08 NOTE — Progress Notes (Signed)
Dr Shawna Clamp aware BS 69-orange juice given

## 2023-02-08 NOTE — Discharge Instructions (Signed)
Advised to continue current medications as prescribed. Advised to take ciprofloxacin 500 mg twice daily for 7 days for gram-negative bacteremia. Will request primary care physician to follow-up on blood culture sensitivity.

## 2023-02-08 NOTE — Progress Notes (Signed)
PHARMACY CONSULT NOTE - FOLLOW UP  Pharmacy Consult for Electrolyte Monitoring and Replacement   Recent Labs: Potassium (mmol/L)  Date Value  02/08/2023 3.2 (L)   Magnesium (mg/dL)  Date Value  02/07/2023 2.1   Calcium (mg/dL)  Date Value  02/08/2023 8.9   Albumin (g/dL)  Date Value  02/07/2023 3.5  06/18/2022 4.8   Phosphorus (mg/dL)  Date Value  02/08/2023 2.7   Sodium (mmol/L)  Date Value  02/08/2023 135  12/26/2021 139    Assessment: 67 year old male PMH HTN, pVD admitted with shock and metabolic acidosis due to DKA. Transitioned off IV insulin to [place] insulin 2/15 PM. Anion gap down trending at 15.  Diet: reg- carb modified  Goal of Therapy:  Electrolytes within normal limits  Plan:  Phos 1.9 > 2.7 >2.7 (lower end of normal). Kphos PO neutral 500 mg x 2 doses today K + 3.2  Will order KCL 40 meq PO x 1 F/u K at 1800 Follow up BMP and phosphorus tomorrow AM   Chinita Greenland PharmD Clinical Pharmacist 02/08/2023'

## 2023-02-08 NOTE — Discharge Summary (Signed)
Physician Discharge Summary  Shawn Daniels [id number] DOB: [birth year] DOA: 02/06/2023  PCP: Glenda Chroman, MD  Admit date: 02/06/2023  Discharge date: 02/08/2023  Admitted From: Home  Disposition:  Home.  Recommendations for Outpatient Follow-up:  Follow up with PCP in 1-2 weeks. Please obtain BMP/CBC in one week. Advised to continue current medications as prescribed. Advised to take ciprofloxacin 500 mg twice daily for 7 days for gram-negative bacteremia. Will request primary care physician to follow-up on blood culture sensitivity.  Home Health:None Equipment/Devices:None  Discharge Condition: Stable CODE STATUS:Full code Diet recommendation: Heart Healthy   Brief [facility] Course: This 67 yo M with hx of DM, denies noncompliance with medication , pVD, HTN came in due to worsening Nausea and vomiting but denies loose stools.He had Kyphoplasty and was dcd on 01/14/23. He was on narcotics last month and stopped this treatment on his own due to feeling abnormal.  He did start new therapy with tramadol and notes worsening symptoms in parallel with this new medication. He denies noncompliance with medication.  He denies sick contacts or travel.  Patient was admitted in the ICU for diabetic ketoacidosis and continued on insulin drip and IV fluids as per DKA protocol.  Anion gap closed.  He was successfully transitioned to subcu insulin and insulin drip discontinued.  Patient did not require pressor support.  He was started on carb modified diet.  Patient feels better and wanted to be discharged.  1 of 4 blood culture bottles grew gram-negative rods sensitivity pending.  Patient wanted to be discharged and he will follow-up with his primary doctor.  Patient was discharged on ciprofloxacin 500 mg twice daily for 7 days for gram-negative bacteremia.   Discharge Diagnoses:  Principal Problem:   Shock circulatory ([place])   Diabetic Ketoacidosis  Resolved Continue subcu  insulin.   Circulatory shock -resolved   Renal Failure-most likely due to transient hypotension with ischemia Resolved.  Serum creatinine back to baseline   ID -no evidence of infection -abx stopped   GI/Nutrition -tolerating carb mod diet   ENDO - ICU hypoglycemic\Hyperglycemia protocol -diabetic coordinator -check FSBS per protocol   ELECTROLYTES -follow labs as needed -replace as needed -pharmacy consultation  Discharge Instructions  Discharge Instructions     Call MD for:  difficulty breathing, headache or visual disturbances   Complete by: As directed    Call MD for:  persistant dizziness or light-headedness   Complete by: As directed    Call MD for:  persistant nausea and vomiting   Complete by: As directed    Diet - low sodium heart healthy   Complete by: As directed    Diet Carb Modified   Complete by: As directed    Discharge instructions   Complete by: As directed    Advised to follow-up with primary care physician in 1 week. Advised to continue current medications as prescribed. Advised to take ciprofloxacin 500 mg twice daily for 7 days for gram-negative bacteremia. Will request primary care physician to follow-up on blood culture sensitivity.   Increase activity slowly   Complete by: As directed       Allergies as of 02/08/2023       Reactions   Codeine Itching, Other (See Comments)   Can take/tolerate in lower doses   Keflex [cephalexin] Other (See Comments)   "Shriveled my skin"   Penicillins Other (See Comments)   Reaction from childhood not recalled        Medication List  STOP taking these medications    HYDROmorphone 2 MG tablet Commonly known as: DILAUDID       TAKE these medications    aspirin EC 81 MG tablet Take 1 tablet (81 mg total) by mouth at bedtime.   ciprofloxacin 500 MG tablet Commonly known as: Cipro Take 1 tablet (500 mg total) by mouth 2 (two) times daily for 7 days.   clopidogrel 75 MG  tablet Commonly known as: PLAVIX Take 1 tablet (75 mg total) by mouth daily.   Empagliflozin-linaGLIPtin 25-5 MG Tabs Take 1 tablet by mouth in the morning.   fenofibrate 145 MG tablet Commonly known as: Tricor Take 1 tablet (145 mg total) by mouth daily.   lisinopril 10 MG tablet Commonly known as: ZESTRIL Take 10 mg by mouth in the morning and at bedtime.   Magnesium 400 MG Caps Take 400 mg by mouth in the morning and at bedtime.   methocarbamol 500 MG tablet Commonly known as: ROBAXIN Take 1 tablet (500 mg total) by mouth every 6 (six) hours as needed for muscle spasms.   metoprolol succinate 25 MG 24 hr tablet Commonly known as: Toprol XL Take 1 tablet (25 mg total) by mouth daily. What changed: when to take this   multivitamin with minerals tablet Take 1 tablet by mouth daily.   nitroGLYCERIN 0.2 mg/hr patch Commonly known as: NITRODUR - Dosed in mg/24 hr APPLY 1/4 (ONE-FOURTH) PATCH TO AFFECTED ELBOW, CHANGE DAILY What changed:  how much to take how to take this when to take this additional instructions   NovoLIN 70/30 ReliOn (70-30) 100 UNIT/ML injection Generic drug: insulin NPH-regular Human Inject 30 Units into the skin See admin instructions. Inject up to 30u under the skin twice daily, according to sliding scale   omeprazole 20 MG capsule Commonly known as: PRILOSEC Take 20 mg by mouth daily before breakfast.   Repatha SureClick [id number] MG/ML Soaj Generic drug: Evolocumab Inject 1 pen. into the skin every 14 (fourteen) days.   rosuvastatin 10 MG tablet Commonly known as: CRESTOR Take 10 mg by mouth at bedtime.   traMADol 50 MG tablet Commonly known as: ULTRAM Take 50 mg by mouth 4 (four) times daily as needed for moderate pain.        Follow-up Information     Vyas, Dhruv B, MD Follow up in 1 week(s).   Specialty: Internal Medicine Contact information: [place] [address] [phone]                Allergies  Allergen  Reactions   Codeine Itching and Other (See Comments)    Can take/tolerate in lower doses   Keflex [Cephalexin] Other (See Comments)    "Shriveled my skin"   Penicillins Other (See Comments)    Reaction from childhood not recalled    Consultations: PCCM   Procedures/Studies: ECHOCARDIOGRAM COMPLETE  Result Date: 02/07/2023    ECHOCARDIOGRAM REPORT   Patient Name:   Brayton Layman Date of Exam: 02/07/2023 Medical Rec #:  [id number]          Height:       73.0 in Accession #:    [id number]         Weight:       189.2 lb Date of Birth:  1956/08/31         BSA:          2.101 m Patient Age:    45 years  BP:           149/70 mmHg Patient Gender: M                  HR:           102 bpm. Exam Location:  [facility] Procedure: 2D Echo, Cardiac Doppler and Color Doppler Indications:     CHF  History:         Patient has no prior history of Echocardiogram examinations.                  CHF, PAD, Arrythmias:Tachycardia; Risk Factors:Hypertension,                  Diabetes and Dyslipidemia.  Sonographer:     Wenda Low Referring Phys:  [id number] [place] Diagnosing Phys: [place]  1. Left ventricular ejection fraction, by estimation, is 60 to 65%. The left ventricle has normal function. The left ventricle has no regional wall motion abnormalities. There is mild concentric left ventricular hypertrophy. Left ventricular diastolic parameters are consistent with Grade I diastolic dysfunction (impaired relaxation).  2. Right ventricular systolic function is normal. The right ventricular size is normal.  3. Left atrial size was mildly dilated.  4. Right atrial size was mildly dilated.  5. The mitral valve is normal in structure. Trivial mitral valve regurgitation. No evidence of mitral stenosis.  6. The aortic valve is normal in structure. Aortic valve regurgitation is not visualized. No aortic stenosis is present.  7. The inferior vena cava is normal in size with greater than 50%  respiratory variability, suggesting right atrial pressure of 3 mmHg. FINDINGS  Left Ventricle: Left ventricular ejection fraction, by estimation, is 60 to 65%. The left ventricle has normal function. The left ventricle has no regional wall motion abnormalities. The left ventricular internal cavity size was normal in size. There is  mild concentric left ventricular hypertrophy. Left ventricular diastolic parameters are consistent with Grade I diastolic dysfunction (impaired relaxation). Right Ventricle: The right ventricular size is normal. No increase in right ventricular wall thickness. Right ventricular systolic function is normal. Left Atrium: Left atrial size was mildly dilated. Right Atrium: Right atrial size was mildly dilated. Pericardium: There is no evidence of pericardial effusion. Mitral Valve: The mitral valve is normal in structure. Trivial mitral valve regurgitation. No evidence of mitral valve stenosis. MV peak gradient, 3.7 mmHg. The mean mitral valve gradient is 1.0 mmHg. Tricuspid Valve: The tricuspid valve is normal in structure. Tricuspid valve regurgitation is trivial. No evidence of tricuspid stenosis. Aortic Valve: The aortic valve is normal in structure. Aortic valve regurgitation is not visualized. No aortic stenosis is present. Aortic valve mean gradient measures 4.0 mmHg. Aortic valve peak gradient measures 6.5 mmHg. Aortic valve area, by VTI measures 2.94 cm. Pulmonic Valve: The pulmonic valve was normal in structure. Pulmonic valve regurgitation is not visualized. No evidence of pulmonic stenosis. Aorta: The aortic root is normal in size and structure. Venous: The inferior vena cava is normal in size with greater than 50% respiratory variability, suggesting right atrial pressure of 3 mmHg. IAS/Shunts: No atrial level shunt detected by color flow Doppler.  LEFT VENTRICLE PLAX 2D LVIDd:         4.90 cm   Diastology LVIDs:         3.20 cm   LV e' medial:    7.83 cm/s LV PW:         1.10 cm    LV E/e' medial:  7.3 LV IVS:        1.00 cm   LV e' lateral:   10.90 cm/s LVOT diam:     2.20 cm   LV E/e' lateral: 5.2 LV SV:         75 LV SV Index:   35 LVOT Area:     3.80 cm  RIGHT VENTRICLE RV Basal diam:  3.35 cm RV Mid diam:    3.50 cm RV S prime:     17.70 cm/s TAPSE (M-mode): 1.9 cm LEFT ATRIUM             Index        RIGHT ATRIUM           Index LA diam:        4.20 cm 2.00 cm/m   RA Area:     19.40 cm LA Vol (A2C):   42.9 ml 20.42 ml/m  RA Volume:   57.60 ml  27.41 ml/m LA Vol (A4C):   42.5 ml 20.23 ml/m LA Biplane Vol: 43.7 ml 20.80 ml/m  AORTIC VALVE                    PULMONIC VALVE AV Area (Vmax):    3.14 cm     PV Vmax:       1.11 m/s AV Area (Vmean):   2.77 cm     PV Peak grad:  4.9 mmHg AV Area (VTI):     2.94 cm AV Vmax:           127.00 cm/s AV Vmean:          90.100 cm/s AV VTI:            0.253 m AV Peak Grad:      6.5 mmHg AV Mean Grad:      4.0 mmHg LVOT Vmax:         105.00 cm/s LVOT Vmean:        65.600 cm/s LVOT VTI:          0.196 m LVOT/AV VTI ratio: 0.77  AORTA Ao Root diam: 3.80 cm MITRAL VALVE MV Area (PHT): 3.43 cm    SHUNTS MV Area VTI:   3.76 cm    Systemic VTI:  0.20 m MV Peak grad:  3.7 mmHg    Systemic Diam: 2.20 cm MV Mean grad:  1.0 mmHg MV Vmax:       0.96 m/s MV Vmean:      55.4 cm/s MV Decel Time: 221 msec MV E velocity: 57.10 cm/s MV A velocity: 82.90 cm/s MV E/A ratio:  0.69 Shaukat Khan Electronically signed by Neoma Laming Signature Date/Time: 02/07/2023/12:03:02 PM    Final    DG Chest Port 1 View  Result Date: 02/06/2023 CLINICAL DATA:  Weakness. EXAM: PORTABLE CHEST 1 VIEW COMPARISON:  01/05/2023 FINDINGS: Prominent lung markings are similar to the previous examination. No evidence for acute airspace disease or pulmonary edema. Heart and mediastinum are within normal limits. Trachea is midline. Old right rib fractures. Negative for a pneumothorax. IMPRESSION: No acute cardiopulmonary disease. Electronically Signed   By: Markus Daft M.D.   On:  02/06/2023 08:34   IR Radiologist Eval & Mgmt  Result Date: 01/29/2023 EXAM: ESTABLISHED PATIENT OFFICE VISIT CHIEF COMPLAINT: Follow-up visit. Current Pain Level: 1-10 HISTORY OF PRESENT ILLNESS: 67 year old gentleman who returns in follow-up subsequent to an uneventful balloon kyphoplasty with compression fracture at L1 on January 13, 2023. The patient reports significant relief of his  low back pain following the procedure. Presently, he only complains of intermittent tightness in the lumbosacral region. Denies the real pain that he experienced prior to the procedure. Denies any pain radiation into the lower extremities. Denies any autonomic dysfunction of bowel or bladder. Is more ambulatory independently though he admits to using a cane as a precaution. He lives independently and is able to maintain his daily living activities. Drives without difficulty. Overall, energy is better. His appetite is intact. Denies any recent chills, fever or rigors. Reports being rather depressed due to not being able to see his pet dog which is with his divorced wife. Past Medical History: No change. Medications: No change. Allergies: No change. Social History: No change. Family History:  No change. REVIEW OF SYSTEMS: Negative unless as mentioned above. PHYSICAL EXAMINATION: Briefly, examination reveals patient is somewhat depressed on account of not being able to see his dog. Otherwise, no gross lateralizing neurologic features. The patient does get up with mild difficulty using support of both his hands. Station and gait within normal limits. ASSESSMENT AND PLAN: Patient encouraged to make an appointment with his physical therapist in order to expedite his recovery from the fracture. Patient advised to maintain good hydration and nutrition. Patient advised to keep regular primary care appointment. Electronically Signed   By: Luanne Bras M.D.   On: 01/29/2023 08:06   IR KYPHO LUMBAR INC FX REDUCE BONE BX UNI/BIL  CANNULATION INC/IMAGING  Result Date: 01/15/2023 INDICATION: Low back pain secondary to compression fracture at L1. EXAM: BALLOON KYPHOPLASTY AT L1 COMPARISON:  None Available. MEDICATIONS: As antibiotic prophylaxis, vancomycin IV was ordered pre-procedure and administered intravenously within 1 hour of incision. All current medications are in the EMR and have been reviewed as part of this encounter. ANESTHESIA/SEDATION: Moderate (conscious) sedation was employed during this procedure. A total of Versed 2 mg and Fentanyl 50 mcg was administered intravenously by the radiology nurse. Total intra-service moderate Sedation Time: 25 minutes. The patient's level of consciousness and vital signs were monitored continuously by radiology nursing throughout the procedure under my direct supervision. FLUOROSCOPY: Radiation Exposure Index (as provided by the fluoroscopic device): 9 minutes, 48 seconds. 0000000 MGy Kerma COMPLICATIONS: None immediate. PROCEDURE: Following a full explanation of the procedure along with the potential associated complications, an informed witnessed consent was obtained. The patient was placed prone on the fluoroscopic table. The skin overlying the lumbar region was then prepped and draped in the usual sterile fashion. The right pedicle at L1 was then infiltrated with 0.25% bupivacaine followed by the advancement of an 11-gauge Jamshidi needle through the right pedicle into the posterior one-third at L1. This was then exchanged for a Kyphon advanced osteo introducer system comprised of a working cannula and a Kyphon osteo drill. This combination was then advanced over a Kyphon osteo bone pin until the tip of the Kyphon osteo drill was in the posterior third at L1. At this time, the bone pin was removed. In a medial trajectory, the combination was advanced until the tip of the working cannula was inside the posterior one-third at L1. Through the working cannula, a Kyphon inflatable bone tamp 20 x 3 was  advanced and positioned with the distal marker 5 mm from the anterior aspect. Crossing of the midline was seen on the AP projection. At this time, the balloon was expanded using contrast via a Kyphon inflation syringe device via microtubing. Inflations were continued until there was apposition with the superior and the inferior endplates. At this time,  methylmethacrylate mixture was reconstituted with Tobramycin in the Kyphon bone mixing device system. This was then loaded onto the Kyphon bone fillers. The balloon was deflated and removed followed by the instillation of 4 1/2 bone filler equivalents of methylmethacrylate mixture at L1 with excellent filling in the AP and lateral projections. No extravasation was noted in the disk spaces or posteriorly into the spinal canal. No epidural venous contamination was seen. The working cannula and the bone filler were then retrieved and removed. Hemostasis was achieved at the skin entry site. IMPRESSION: 1. Status post vertebrobasilar augmentation for painful compression fracture at L1 using the balloon kyphoplasty technique. If the patient has known osteoporosis, recommend treatment as clinically indicated. If the patient's bone density status is unknown, DEXA scan is recommended. Electronically Signed   By: Luanne Bras M.D.   On: 01/15/2023 08:42     Subjective: Patient seen and examined at bedside.  Overnight events noted.   Patient reports doing much better and wanted to be discharged.   Patient being discharged home,  denies any nausea,  vomiting and Abdominal pain  Discharge Exam: Vitals:   02/07/23 2028 02/08/23 0119  BP: (!) 161/81 128/79  Pulse: 93 90  Resp: 18 18  Temp: 98.2 F (36.8 C) 97.8 F (36.6 C)  SpO2: 99% 97%   Vitals:   02/07/23 1500 02/07/23 1600 02/07/23 2028 02/08/23 0119  BP:  133/67 (!) 161/81 128/79  Pulse: 93 93 93 90  Resp: (!) 21 16 18 18  $ Temp:  98.1 F (36.7 C) 98.2 F (36.8 C) 97.8 F (36.6 C)  TempSrc:   Oral    SpO2: 94% 100% 99% 97%  Weight:      Height:        General: Pt is alert, awake, not in acute distress Cardiovascular: RRR, S1/S2 +, no rubs, no gallops Respiratory: CTA bilaterally, no wheezing, no rhonchi Abdominal: Soft, NT, ND, bowel sounds + Extremities: no edema, no cyanosis    The results of significant diagnostics from this hospitalization (including imaging, microbiology, ancillary and laboratory) are listed below for reference.     Microbiology: Recent Results (from the past 240 hour(s))  Resp panel by RT-PCR (RSV, Flu A&B, Covid) Anterior Nasal Swab     Status: None   Collection Time: 02/06/23  8:34 AM   Specimen: Anterior Nasal Swab  Result Value Ref Range Status   SARS Coronavirus 2 by RT PCR NEGATIVE NEGATIVE Final    Comment: (NOTE) SARS-CoV-2 target nucleic acids are NOT DETECTED.  The SARS-CoV-2 RNA is generally detectable in upper respiratory specimens during the acute phase of infection. The lowest concentration of SARS-CoV-2 viral copies this assay can detect is 138 copies/mL. A negative result does not preclude SARS-Cov-2 infection and should not be used as the sole basis for treatment or other patient management decisions. A negative result may occur with  improper specimen collection/handling, submission of specimen other than nasopharyngeal swab, presence of viral mutation(s) within the areas targeted by this assay, and inadequate number of viral copies(<138 copies/mL). A negative result must be combined with clinical observations, patient history, and epidemiological information. The expected result is Negative.  Fact Sheet for Patients:  EntrepreneurPulse.com.au  Fact Sheet for Healthcare Providers:  IncredibleEmployment.be  This test is no t yet approved or cleared by the [place] FDA and  has been authorized for detection and/or diagnosis of SARS-CoV-2 by FDA under an Emergency Use  Authorization (EUA). This EUA will remain  in effect (meaning this  test can be used) for the duration of the COVID-19 declaration under Section 564(b)(1) of the Act, 21 U.S.C.section 360bbb-3(b)(1), unless the authorization is terminated  or revoked sooner.       Influenza A by PCR NEGATIVE NEGATIVE Final   Influenza B by PCR NEGATIVE NEGATIVE Final    Comment: (NOTE) The Xpert Xpress SARS-CoV-2/FLU/RSV plus assay is intended as an aid in the diagnosis of influenza from Nasopharyngeal swab specimens and should not be used as a sole basis for treatment. Nasal washings and aspirates are unacceptable for Xpert Xpress SARS-CoV-2/FLU/RSV testing.  Fact Sheet for Patients: EntrepreneurPulse.com.au  Fact Sheet for Healthcare Providers: IncredibleEmployment.be  This test is not yet approved or cleared by the [place] FDA and has been authorized for detection and/or diagnosis of SARS-CoV-2 by FDA under an Emergency Use Authorization (EUA). This EUA will remain in effect (meaning this test can be used) for the duration of the COVID-19 declaration under Section 564(b)(1) of the Act, 21 U.S.C. section 360bbb-3(b)(1), unless the authorization is terminated or revoked.     Resp Syncytial Virus by PCR NEGATIVE NEGATIVE Final    Comment: (NOTE) Fact Sheet for Patients: EntrepreneurPulse.com.au  Fact Sheet for Healthcare Providers: IncredibleEmployment.be  This test is not yet approved or cleared by the [place] FDA and has been authorized for detection and/or diagnosis of SARS-CoV-2 by FDA under an Emergency Use Authorization (EUA). This EUA will remain in effect (meaning this test can be used) for the duration of the COVID-19 declaration under Section 564(b)(1) of the Act, 21 U.S.C. section 360bbb-3(b)(1), unless the authorization is terminated or revoked.  Performed at [facility], [address] [place], [place] [address]   Culture, blood (routine x 2)     Status: None (Preliminary result)   Collection Time: 02/06/23  8:34 AM   Specimen: BLOOD RIGHT ARM  Result Value Ref Range Status   Specimen Description BLOOD RIGHT ARM  Final   Special Requests   Final    BOTTLES DRAWN AEROBIC AND ANAEROBIC Blood Culture adequate volume   Culture   Final    NO GROWTH 2 DAYS Performed at [facility], [address] [place], [place] [address]    Report Status PENDING  Incomplete  Culture, blood (routine x 2)     Status: None (Preliminary result)   Collection Time: 02/06/23  9:47 AM   Specimen: BLOOD  Result Value Ref Range Status   Specimen Description   Final    BLOOD BLOOD LEFT FOREARM Performed at [facility], [address] [place], [place] [address]    Special Requests   Final    BOTTLES DRAWN AEROBIC AND ANAEROBIC Blood Culture results may not be optimal due to an inadequate volume of blood received in culture bottles Performed at [facility], [address] [place],  [address]    Culture  Setup Time   Final    Organism ID to follow [place] CRITICAL RESULT CALLED TO, READ BACK BY AND VERIFIED WITH: TREY GREENWOOD @ 2011 02/07/23 LFD    Culture GRAM NEGATIVE RODS  Final   Report Status PENDING  Incomplete  Blood Culture ID Panel (Reflexed)     Status: None   Collection Time: 02/06/23  9:47 AM  Result Value Ref Range Status   Enterococcus faecalis NOT DETECTED NOT DETECTED Final   Enterococcus Faecium NOT DETECTED NOT DETECTED Final   Listeria monocytogenes NOT DETECTED NOT DETECTED Final  Staphylococcus species NOT DETECTED NOT DETECTED Final   Staphylococcus aureus (BCID) NOT DETECTED NOT DETECTED Final   Staphylococcus epidermidis NOT DETECTED NOT DETECTED Final   Staphylococcus lugdunensis NOT DETECTED NOT DETECTED Final   Streptococcus species NOT DETECTED NOT DETECTED Final   Streptococcus  agalactiae NOT DETECTED NOT DETECTED Final   Streptococcus pneumoniae NOT DETECTED NOT DETECTED Final   Streptococcus pyogenes NOT DETECTED NOT DETECTED Final   A.calcoaceticus-baumannii NOT DETECTED NOT DETECTED Final   Bacteroides fragilis NOT DETECTED NOT DETECTED Final   Enterobacterales NOT DETECTED NOT DETECTED Final   Enterobacter cloacae complex NOT DETECTED NOT DETECTED Final   Escherichia coli NOT DETECTED NOT DETECTED Final   Klebsiella aerogenes NOT DETECTED NOT DETECTED Final   Klebsiella oxytoca NOT DETECTED NOT DETECTED Final   Klebsiella pneumoniae NOT DETECTED NOT DETECTED Final   Proteus species NOT DETECTED NOT DETECTED Final   Salmonella species NOT DETECTED NOT DETECTED Final   Serratia marcescens NOT DETECTED NOT DETECTED Final   Haemophilus influenzae NOT DETECTED NOT DETECTED Final   Neisseria meningitidis NOT DETECTED NOT DETECTED Final   Pseudomonas aeruginosa NOT DETECTED NOT DETECTED Final   Stenotrophomonas maltophilia NOT DETECTED NOT DETECTED Final   Candida albicans NOT DETECTED NOT DETECTED Final   Candida auris NOT DETECTED NOT DETECTED Final   Candida glabrata NOT DETECTED NOT DETECTED Final   Candida krusei NOT DETECTED NOT DETECTED Final   Candida parapsilosis NOT DETECTED NOT DETECTED Final   Candida tropicalis NOT DETECTED NOT DETECTED Final   Cryptococcus neoformans/gattii NOT DETECTED NOT DETECTED Final    Comment: Performed at [facility], [address] [place], [place] [address]  MRSA Next Gen by PCR, Nasal     Status: None   Collection Time: 02/06/23  3:01 PM   Specimen: Nasal Mucosa; Nasal Swab  Result Value Ref Range Status   MRSA by PCR Next Gen NOT DETECTED NOT DETECTED Final    Comment: (NOTE) The GeneXpert MRSA Assay (FDA approved for NASAL specimens only), is one component of a comprehensive MRSA colonization surveillance program. It is not intended to diagnose MRSA infection nor to guide or monitor treatment for MRSA  infections. Test performance is not FDA approved in patients less than 59 years old. Performed at [facility], [address] [place], [place] [address]      Labs: BNP (last 3 results) No results for input(s): "BNP" in the last 8760 hours. Basic Metabolic Panel: Recent Labs  Lab 02/06/23 1535 02/06/23 1924 02/07/23 0018 02/07/23 0552 02/08/23 0542  NA 128* 129* 134* 134* 135  K 4.1 4.4 4.8 4.0 3.2*  CL 97* 94* 100 100 99  CO2 10* 15* 20* 19* 22  GLUCOSE 962* 134* 137* 115* 73  BUN 23 24* 18 15 9  $ CREATININE 1.30* 1.20 1.00 0.88 0.69  CALCIUM 7.7* 8.5* 9.0 8.7* 8.9  MG  --  2.1  --  2.1  --   PHOS  --  1.9*  --  2.7 2.7   Liver Function Tests: Recent Labs  Lab 02/06/23 0834 02/07/23 0552  AST 46* 125*  ALT 39 95*  ALKPHOS 72 52  BILITOT 2.4* 1.6*  PROT 8.0 6.2*  ALBUMIN 4.4 3.5   No results for input(s): "LIPASE", "AMYLASE" in the last 168 hours. No results for input(s): "AMMONIA" in the last 168 hours. CBC: Recent Labs  Lab 02/06/23 0834 02/07/23 0552  WBC 9.6 7.2  NEUTROABS 8.2*  --   HGB 13.0 11.8*  HCT 40.7 33.4*  MCV 100.5* 92.3  PLT 154 107*   Cardiac Enzymes: No results for input(s): "CKTOTAL", "CKMB", "CKMBINDEX", "TROPONINI" in the last 168 hours. BNP: Invalid input(s): "POCBNP" CBG: Recent Labs  Lab 02/07/23 2007 02/07/23 2344 02/08/23 0120 02/08/23 0727 02/08/23 0758  GLUCAP 258* 157* 131* 69* 119*   D-Dimer No results for input(s): "DDIMER" in the last 72 hours. Hgb A1c No results for input(s): "HGBA1C" in the last 72 hours. Lipid Profile No results for input(s): "CHOL", "HDL", "LDLCALC", "TRIG", "CHOLHDL", "LDLDIRECT" in the last 72 hours. Thyroid function studies No results for input(s): "TSH", "T4TOTAL", "T3FREE", "THYROIDAB" in the last 72 hours.  Invalid input(s): "FREET3" Anemia work up No results for input(s): "VITAMINB12", "FOLATE", "FERRITIN", "TIBC", "IRON", "RETICCTPCT" in the last 72  hours. Urinalysis    Component Value Date/Time   COLORURINE STRAW (A) 02/06/2023 1410   APPEARANCEUR CLEAR (A) 02/06/2023 1410   LABSPEC 1.017 02/06/2023 1410   PHURINE 5.0 02/06/2023 1410   GLUCOSEU >=500 (A) 02/06/2023 1410   HGBUR SMALL (A) 02/06/2023 1410   BILIRUBINUR NEGATIVE 02/06/2023 1410   KETONESUR 80 (A) 02/06/2023 1410   PROTEINUR 30 (A) 02/06/2023 1410   NITRITE NEGATIVE 02/06/2023 1410   LEUKOCYTESUR NEGATIVE 02/06/2023 1410   Sepsis Labs Recent Labs  Lab 02/06/23 0834 02/07/23 0552  WBC 9.6 7.2   Microbiology Recent Results (from the past 240 hour(s))  Resp panel by RT-PCR (RSV, Flu A&B, Covid) Anterior Nasal Swab     Status: None   Collection Time: 02/06/23  8:34 AM   Specimen: Anterior Nasal Swab  Result Value Ref Range Status   SARS Coronavirus 2 by RT PCR NEGATIVE NEGATIVE Final    Comment: (NOTE) SARS-CoV-2 target nucleic acids are NOT DETECTED.  The SARS-CoV-2 RNA is generally detectable in upper respiratory specimens during the acute phase of infection. The lowest concentration of SARS-CoV-2 viral copies this assay can detect is 138 copies/mL. A negative result does not preclude SARS-Cov-2 infection and should not be used as the sole basis for treatment or other patient management decisions. A negative result may occur with  improper specimen collection/handling, submission of specimen other than nasopharyngeal swab, presence of viral mutation(s) within the areas targeted by this assay, and inadequate number of viral copies(<138 copies/mL). A negative result must be combined with clinical observations, patient history, and epidemiological information. The expected result is Negative.  Fact Sheet for Patients:  EntrepreneurPulse.com.au  Fact Sheet for Healthcare Providers:  IncredibleEmployment.be  This test is no t yet approved or cleared by the [place] FDA and  has been authorized for detection  and/or diagnosis of SARS-CoV-2 by FDA under an Emergency Use Authorization (EUA). This EUA will remain  in effect (meaning this test can be used) for the duration of the COVID-19 declaration under Section 564(b)(1) of the Act, 21 U.S.C.section 360bbb-3(b)(1), unless the authorization is terminated  or revoked sooner.       Influenza A by PCR NEGATIVE NEGATIVE Final   Influenza B by PCR NEGATIVE NEGATIVE Final    Comment: (NOTE) The Xpert Xpress SARS-CoV-2/FLU/RSV plus assay is intended as an aid in the diagnosis of influenza from Nasopharyngeal swab specimens and should not be used as a sole basis for treatment. Nasal washings and aspirates are unacceptable for Xpert Xpress SARS-CoV-2/FLU/RSV testing.  Fact Sheet for Patients: EntrepreneurPulse.com.au  Fact Sheet for Healthcare Providers: IncredibleEmployment.be  This test is not yet approved or cleared by the [place] FDA and has been authorized for detection  and/or diagnosis of SARS-CoV-2 by FDA under an Emergency Use Authorization (EUA). This EUA will remain in effect (meaning this test can be used) for the duration of the COVID-19 declaration under Section 564(b)(1) of the Act, 21 U.S.C. section 360bbb-3(b)(1), unless the authorization is terminated or revoked.     Resp Syncytial Virus by PCR NEGATIVE NEGATIVE Final    Comment: (NOTE) Fact Sheet for Patients: EntrepreneurPulse.com.au  Fact Sheet for Healthcare Providers: IncredibleEmployment.be  This test is not yet approved or cleared by the [place] FDA and has been authorized for detection and/or diagnosis of SARS-CoV-2 by FDA under an Emergency Use Authorization (EUA). This EUA will remain in effect (meaning this test can be used) for the duration of the COVID-19 declaration under Section 564(b)(1) of the Act, 21 U.S.C. section 360bbb-3(b)(1), unless the authorization is terminated  or revoked.  Performed at [facility], [address] [place], [place] [address]   Culture, blood (routine x 2)     Status: None (Preliminary result)   Collection Time: 02/06/23  8:34 AM   Specimen: BLOOD RIGHT ARM  Result Value Ref Range Status   Specimen Description BLOOD RIGHT ARM  Final   Special Requests   Final    BOTTLES DRAWN AEROBIC AND ANAEROBIC Blood Culture adequate volume   Culture   Final    NO GROWTH 2 DAYS Performed at [facility], [address], [place], [place] [address]    Report Status PENDING  Incomplete  Culture, blood (routine x 2)     Status: None (Preliminary result)   Collection Time: 02/06/23  9:47 AM   Specimen: BLOOD  Result Value Ref Range Status   Specimen Description   Final    BLOOD BLOOD LEFT FOREARM Performed at [facility], [address], [place], [place] [address]    Special Requests   Final    BOTTLES DRAWN AEROBIC AND ANAEROBIC Blood Culture results may not be optimal due to an inadequate volume of blood received in culture bottles Performed at [facility], [address] [place], [place] [address]    Culture  Setup Time   Final    Organism ID to follow [place] CRITICAL RESULT CALLED TO, READ BACK BY AND VERIFIED WITH: TREY GREENWOOD @ 2011 02/07/23 LFD    Culture GRAM NEGATIVE RODS  Final   Report Status PENDING  Incomplete  Blood Culture ID Panel (Reflexed)     Status: None   Collection Time: 02/06/23  9:47 AM  Result Value Ref Range Status   Enterococcus faecalis NOT DETECTED NOT DETECTED Final   Enterococcus Faecium NOT DETECTED NOT DETECTED Final   Listeria monocytogenes NOT DETECTED NOT DETECTED Final   Staphylococcus species NOT DETECTED NOT DETECTED Final   Staphylococcus aureus (BCID) NOT DETECTED NOT DETECTED Final   Staphylococcus epidermidis NOT DETECTED NOT DETECTED Final   Staphylococcus lugdunensis NOT DETECTED NOT DETECTED Final    Streptococcus species NOT DETECTED NOT DETECTED Final   Streptococcus agalactiae NOT DETECTED NOT DETECTED Final   Streptococcus pneumoniae NOT DETECTED NOT DETECTED Final   Streptococcus pyogenes NOT DETECTED NOT DETECTED Final   A.calcoaceticus-baumannii NOT DETECTED NOT DETECTED Final   Bacteroides fragilis NOT DETECTED NOT DETECTED Final   Enterobacterales NOT DETECTED NOT DETECTED Final   Enterobacter cloacae complex NOT DETECTED NOT DETECTED Final   Escherichia coli NOT DETECTED NOT DETECTED Final   Klebsiella aerogenes NOT DETECTED NOT DETECTED Final   Klebsiella oxytoca NOT DETECTED  NOT DETECTED Final   Klebsiella pneumoniae NOT DETECTED NOT DETECTED Final   Proteus species NOT DETECTED NOT DETECTED Final   Salmonella species NOT DETECTED NOT DETECTED Final   Serratia marcescens NOT DETECTED NOT DETECTED Final   Haemophilus influenzae NOT DETECTED NOT DETECTED Final   Neisseria meningitidis NOT DETECTED NOT DETECTED Final   Pseudomonas aeruginosa NOT DETECTED NOT DETECTED Final   Stenotrophomonas maltophilia NOT DETECTED NOT DETECTED Final   Candida albicans NOT DETECTED NOT DETECTED Final   Candida auris NOT DETECTED NOT DETECTED Final   Candida glabrata NOT DETECTED NOT DETECTED Final   Candida krusei NOT DETECTED NOT DETECTED Final   Candida parapsilosis NOT DETECTED NOT DETECTED Final   Candida tropicalis NOT DETECTED NOT DETECTED Final   Cryptococcus neoformans/gattii NOT DETECTED NOT DETECTED Final    Comment: Performed at [facility], [address] [place], [place] [address]  MRSA Next Gen by PCR, Nasal     Status: None   Collection Time: 02/06/23  3:01 PM   Specimen: Nasal Mucosa; Nasal Swab  Result Value Ref Range Status   MRSA by PCR Next Gen NOT DETECTED NOT DETECTED Final    Comment: (NOTE) The GeneXpert MRSA Assay (FDA approved for NASAL specimens only), is one component of a comprehensive MRSA colonization surveillance program. It is not intended  to diagnose MRSA infection nor to guide or monitor treatment for MRSA infections. Test performance is not FDA approved in patients less than 28 years old. Performed at [facility], [address], [place], [place] [address]      Time coordinating discharge: Over 30 minutes  SIGNED:   Shawna Clamp, MD  [facility] 02/08/2023, 2:42 PM Pager   If 7PM-7AM, please contact night-coverage

## 2023-02-08 NOTE — Progress Notes (Signed)
Dr Shawna Clamp made aware that, Pt very rude/angry with staff, refuses all morning meds except insulin, states that he wants to see a MD so that he can be discharged

## 2023-02-11 LAB — CULTURE, BLOOD (ROUTINE X 2)
Culture: NO GROWTH
Special Requests: ADEQUATE

## 2023-02-13 NOTE — Therapy (Signed)
OUTPATIENT PHYSICAL THERAPY THORACOLUMBAR EVALUATION   Patient Name: Shawn Daniels MRN: [id number] DOB:1956/11/22, 67 y.o., male Today's Date: 02/13/2023  END OF SESSION:   Past Medical History:  Diagnosis Date   Diabetes mellitus without complication ([place])    GERD (gastroesophageal reflux disease)    Hypertension    Peripheral vascular disease ([place])    Past Surgical History:  Procedure Laterality Date   ABDOMINAL AORTOGRAM W/LOWER EXTREMITY Right 08/20/2022   Procedure: ABDOMINAL AORTOGRAM W/LOWER EXTREMITY;  Surgeon: Serafina Mitchell, MD;  Location: [facility];  Service: Cardiovascular;  Laterality: Right;   ABDOMINAL AORTOGRAM W/LOWER EXTREMITY N/A 11/19/2022   Procedure: ABDOMINAL AORTOGRAM W/LOWER EXTREMITY;  Surgeon: Serafina Mitchell, MD;  Location: [facility];  Service: Cardiovascular;  Laterality: N/A;   ENDARTERECTOMY FEMORAL Right 04/26/2022   Procedure: RIGHT FEMORAL ENDARTERECTOMY;  Surgeon: Serafina Mitchell, MD;  Location: [facility];  Service: Vascular;  Laterality: Right;   INTRAVASCULAR LITHOTRIPSY Left 11/19/2022   Procedure: INTRAVASCULAR LITHOTRIPSY;  Surgeon: Serafina Mitchell, MD;  Location: [facility];  Service: Cardiovascular;  Laterality: Left;   IR KYPHO LUMBAR INC FX REDUCE BONE BX UNI/BIL CANNULATION INC/IMAGING  01/13/2023   IR RADIOLOGIST EVAL & MGMT  01/29/2023   KNEE SURGERY     LOWER EXTREMITY ANGIOGRAPHY N/A 01/01/2022   Procedure: LOWER EXTREMITY ANGIOGRAPHY;  Surgeon: Nigel Mormon, MD;  Location: [facility];  Service: Cardiovascular;  Laterality: N/A;   PATCH ANGIOPLASTY Right 04/26/2022   Procedure: PATCH ANGIOPLASTY OF RIGHT FEMORAL ARTERY USING XENOSURE BOVINE [place];  Surgeon: Serafina Mitchell, MD;  Location: [facility];  Service: Vascular;  Laterality: Right;   PENILE PROSTHESIS IMPLANT     PERIPHERAL VASCULAR ATHERECTOMY  08/20/2022   Procedure: PERIPHERAL VASCULAR ATHERECTOMY;  Surgeon: Serafina Mitchell,  MD;  Location: [facility];  Service: Cardiovascular;;  popliteal   PERIPHERAL VASCULAR BALLOON ANGIOPLASTY  01/08/2022   Procedure: PERIPHERAL VASCULAR BALLOON ANGIOPLASTY;  Surgeon: Adrian Prows, MD;  Location: [facility];  Service: Cardiovascular;;   PERIPHERAL VASCULAR INTERVENTION Left 11/19/2022   Procedure: PERIPHERAL VASCULAR INTERVENTION;  Surgeon: Serafina Mitchell, MD;  Location: [facility];  Service: Cardiovascular;  Laterality: Left;   WRIST SURGERY     Patient Active Problem List   Diagnosis Date Noted   Shock circulatory ([place]) 02/06/2023   Lumbar compression fracture, closed, initial encounter ([place]) 01/05/2023   Alcohol dependence ([place]) 01/05/2023   Sinus tachycardia 10/08/2022   PAD (peripheral artery disease) ([place]) 04/26/2022   Hypertension 04/25/2022   Insulin dependent diabetes mellitus type IA ([place]) 04/25/2022   HLD (hyperlipidemia) 04/03/2022   Claudication in peripheral vascular disease ([place]) 12/31/2021   Extensor tendon disruption 06/19/2020   Traumatic tear of supraspinatus tendon, right, initial encounter 12/02/2018   Rib contusion, right, initial encounter 12/02/2018   Trigger finger, acquired 06/29/2018   Mass of finger of left hand 11/01/2017   Closed fracture of first metacarpal bone of left hand with routine healing, subsequent encounter 09/21/2017    PCP: Jerene Bears  REFERRING PROVIDER: Tamela Gammon  REFERRING DIAG:  S/P L1 Fracture surgery      Rationale for Evaluation and Treatment: Rehabilitation  THERAPY DIAG:  No diagnosis found.  ONSET DATE: 01/05/23  SUBJECTIVE:  SUBJECTIVE STATEMENT: Not doing well, I had surgery and the rehab has been a "b". I did not get rehab in the hospital, I was doing everything on my own. This time last  week I was in the ICU because I was sick and went to the hospital. I have my own brace and I am moving and lifting stuff.   PERTINENT HISTORY:  Shawn Daniels is a 67 y.o. male with medical history significant of DM, HTN, and PVD presenting with a fall.  He reports that his blood sugar was low.  He got up to get some glucose tabs and fell onto the fire place hearth.  He had to crawl to the bathroom.  He doesn't think his glucose is low often.  He does not normally have back pain.  He is opposed to the lidoderm, doesn't think it will help.  Brace isn't helping much, not wearing TLSO.  No radicular symptoms   Kyphoplasty 01/13/22  PAIN:  Are you having pain? Yes: NPRS scale: 5/10 Pain location: low back Pain description: soreness, tightness  Aggravating factors: overextending myself Relieving factors: Tylenol, rest  PRECAUTIONS: None  WEIGHT BEARING RESTRICTIONS: No  FALLS:  Has patient fallen in last 6 months? Yes. Number of falls 1  LIVING ENVIRONMENT: Lives with: lives with their family Lives in: House/apartment Stairs: No Has following equipment at home: None  OCCUPATION: heavy duty truck Education officer, community, and Software engineer  PLOF: Independent  PATIENT GOALS: get my back so it doesn't hurt    OBJECTIVE:   DIAGNOSTIC FINDINGS:  IMPRESSION: 1. Status post vertebrobasilar augmentation for painful compression fracture at L1 using the balloon kyphoplasty technique.   If the patient has known osteoporosis, recommend treatment as clinically indicated. If the patient's bone density status is unknown, DEXA scan is recommended  SCREENING FOR RED FLAGS: Bowel or bladder incontinence: No Spinal tumors: No Cauda equina syndrome: No Compression fracture: No Abdominal aneurysm: No  COGNITION: Overall cognitive status: Within functional limits for tasks assessed     SENSATION: WFL  MUSCLE LENGTH: Hamstrings: mod tightness BLE  POSTURE: rounded shoulders  LUMBAR  ROM:   AROM eval  Flexion WFL  Extension Very limited  Right lateral flexion Mild limitations  Left lateral flexion Mild limitations  Right rotation 50%  Left rotation 50%   (Blank rows = not tested)  LOWER EXTREMITY ROM:  WFL   LOWER EXTREMITY MMT:    MMT Right eval Left eval  Hip flexion 4 4+  Hip extension    Hip abduction 4 4  Hip adduction 5 5  Hip internal rotation    Hip external rotation    Knee flexion 5 5  Knee extension 5 5  Ankle dorsiflexion    Ankle plantarflexion    Ankle inversion    Ankle eversion     (Blank rows = not tested)  LUMBAR SPECIAL TESTS:  Straight leg raise test: Positive and FABER test: Negative  FUNCTIONAL TESTS:  5 times sit to stand: 20.39s  GAIT: Distance walked: in clinic distances Assistive device utilized: Single point cane and None Level of assistance: Complete Independence Comments: forward trunk lean, antalgic gait, decreased step length and foot clearance   TODAY'S TREATMENT:  DATE: EVAL- 02/14/23    PATIENT EDUCATION:  Education details: POC and HEP Person educated: Patient Education method: Explanation Education comprehension: verbalized understanding  HOME EXERCISE PROGRAM: Access Code: [email or web address] URL: [email or web address] Date: 02/14/2023 Prepared by: Andris Baumann  Exercises - Supine Bridge  - 1 x daily - 7 x weekly - 2 sets - 10 reps - Supine Lower Trunk Rotation  - 1 x daily - 7 x weekly - 2 sets - 10 reps - Supine Piriformis Stretch with Foot on Ground  - 1 x daily - 7 x weekly - 2 reps - 30 hold - Seated Hamstring Stretch  - 1 x daily - 7 x weekly - 2 reps - 30 hold - Supine Single Knee to Chest Stretch  - 1 x daily - 7 x weekly - 2 reps - 30 hold  ASSESSMENT:  CLINICAL IMPRESSION: Patient is a 67 y.o. male who was seen today for physical therapy evaluation and  treatment for low back pain. He had a fall and got a khypoplasty on L1. He was noncompliant with LBP precautions and wearing back brace given at hospital. He presents with some LE weakness, and decreased mobility and flexibility in his low back. Patient was educated to take things slow and not do any heavy lifting, bending, or twisting only ~4 weeks out of surgery and requires healing time. He seems to understand but states he has too many things to get done. He demonstrated increased fatigue during evaluation and functional testing. Patient will benefit from skilled PT intervention to address his low back pain and mobility to be able to return to PLOF and return to gym activities.   REHAB POTENTIAL: Good  CLINICAL DECISION MAKING: Stable/uncomplicated  EVALUATION COMPLEXITY: Low   GOALS: Goals reviewed with patient? Yes  SHORT TERM GOALS: Target date: 03/21/23  Patient will be independent with initial HEP.  Goal status: INITIAL   LONG TERM GOALS: Target date: 04/18/23  Patient will be independent with advanced/ongoing HEP to improve outcomes and carryover.  Goal status: INITIAL  2.  Patient will report 75% improvement in low back pain to improve QOL.  Baseline: 5/10 Goal status: INITIAL  3.  Patient will demonstrate full pain free lumbar ROM to perform ADLs.   Goal status: INITIAL  4.  Patient will demonstrate improved functional strength as demonstrated by <14s on STS. Baseline: 20.39s Goal status: INITIAL   PLAN:  PT FREQUENCY: 1-2x/week  PT DURATION: 8 weeks  PLANNED INTERVENTIONS: Therapeutic exercises, Therapeutic activity, Neuromuscular re-education, Balance training, Gait training, Patient/Family education, Self Care, Joint mobilization, Stair training, Dry Needling, Spinal manipulation, Spinal mobilization, Cryotherapy, Moist heat, Traction, Ionotophoresis '4mg'$ /ml Dexamethasone, and Manual therapy.  PLAN FOR NEXT SESSION: low back stretching and functional  strengthening   Andris Baumann, PT 02/13/2023, 6:51 PM

## 2023-02-14 ENCOUNTER — Ambulatory Visit: Payer: Managed Care, Other (non HMO)

## 2023-02-14 DIAGNOSIS — M5459 Other low back pain: Secondary | ICD-10-CM

## 2023-02-14 DIAGNOSIS — M62830 Muscle spasm of back: Secondary | ICD-10-CM

## 2023-02-14 DIAGNOSIS — Z9889 Other specified postprocedural states: Secondary | ICD-10-CM

## 2023-02-14 DIAGNOSIS — M6281 Muscle weakness (generalized): Secondary | ICD-10-CM

## 2023-02-14 NOTE — Therapy (Signed)
OUTPATIENT PHYSICAL THERAPY THORACOLUMBAR EVALUATION   Patient Name: Shawn Daniels MRN: [id number] DOB:Nov 01, 1956, 67 y.o., male Today's Date: 02/13/2023  END OF SESSION:   Past Medical History:  Diagnosis Date   Diabetes mellitus without complication ([place])    GERD (gastroesophageal reflux disease)    Hypertension    Peripheral vascular disease ([place])    Past Surgical History:  Procedure Laterality Date   ABDOMINAL AORTOGRAM W/LOWER EXTREMITY Right 08/20/2022   Procedure: ABDOMINAL AORTOGRAM W/LOWER EXTREMITY;  Surgeon: Serafina Mitchell, MD;  Location: [facility];  Service: Cardiovascular;  Laterality: Right;   ABDOMINAL AORTOGRAM W/LOWER EXTREMITY N/A 11/19/2022   Procedure: ABDOMINAL AORTOGRAM W/LOWER EXTREMITY;  Surgeon: Serafina Mitchell, MD;  Location: [facility];  Service: Cardiovascular;  Laterality: N/A;   ENDARTERECTOMY FEMORAL Right 04/26/2022   Procedure: RIGHT FEMORAL ENDARTERECTOMY;  Surgeon: Serafina Mitchell, MD;  Location: [facility];  Service: Vascular;  Laterality: Right;   INTRAVASCULAR LITHOTRIPSY Left 11/19/2022   Procedure: INTRAVASCULAR LITHOTRIPSY;  Surgeon: Serafina Mitchell, MD;  Location: [facility];  Service: Cardiovascular;  Laterality: Left;   IR KYPHO LUMBAR INC FX REDUCE BONE BX UNI/BIL CANNULATION INC/IMAGING  01/13/2023   IR RADIOLOGIST EVAL & MGMT  01/29/2023   KNEE SURGERY     LOWER EXTREMITY ANGIOGRAPHY N/A 01/01/2022   Procedure: LOWER EXTREMITY ANGIOGRAPHY;  Surgeon: Nigel Mormon, MD;  Location: [facility];  Service: Cardiovascular;  Laterality: N/A;   PATCH ANGIOPLASTY Right 04/26/2022   Procedure: PATCH ANGIOPLASTY OF RIGHT FEMORAL ARTERY USING XENOSURE BOVINE [place];  Surgeon: Serafina Mitchell, MD;  [facility];  Service: Vascular;  Laterality: Right;   PENILE PROSTHESIS IMPLANT     PERIPHERAL VASCULAR ATHERECTOMY  08/20/2022   Procedure: PERIPHERAL VASCULAR ATHERECTOMY;  Surgeon: Serafina Mitchell,  MD;  Location: [facility];  Service: Cardiovascular;;  popliteal   PERIPHERAL VASCULAR BALLOON ANGIOPLASTY  01/08/2022   Procedure: PERIPHERAL VASCULAR BALLOON ANGIOPLASTY;  Surgeon: Adrian Prows, MD;  Location: [facility];  Service: Cardiovascular;;   PERIPHERAL VASCULAR INTERVENTION Left 11/19/2022   Procedure: PERIPHERAL VASCULAR INTERVENTION;  Surgeon: Serafina Mitchell, MD;  Location: [facility];  Service: Cardiovascular;  Laterality: Left;   WRIST SURGERY     Patient Active Problem List   Diagnosis Date Noted   Shock circulatory ([place]) 02/06/2023   Lumbar compression fracture, closed, initial encounter ([place]) 01/05/2023   Alcohol dependence ([place]) 01/05/2023   Sinus tachycardia 10/08/2022   PAD (peripheral artery disease) ([place]) 04/26/2022   Hypertension 04/25/2022   Insulin dependent diabetes mellitus type IA ([place]) 04/25/2022   HLD (hyperlipidemia) 04/03/2022   Claudication in peripheral vascular disease ([place]) 12/31/2021   Extensor tendon disruption 06/19/2020   Traumatic tear of supraspinatus tendon, right, initial encounter 12/02/2018   Rib contusion, right, initial encounter 12/02/2018   Trigger finger, acquired 06/29/2018   Mass of finger of left hand 11/01/2017   Closed fracture of first metacarpal bone of left hand with routine healing, subsequent encounter 09/21/2017    PCP: Jerene Bears  REFERRING PROVIDER: Tamela Gammon  REFERRING DIAG:  S/P L1 Fracture surgery      Rationale for Evaluation and Treatment: Rehabilitation  THERAPY DIAG:  No diagnosis found.  ONSET DATE: 01/05/23  SUBJECTIVE:  SUBJECTIVE STATEMENT: Not doing well, I had surgery and the rehab has been a "b". I did not get rehab in the hospital, I was doing everything on my own. This time last  week I was in the ICU because I was sick and went to the hospital. I have my own brace and I am moving and lifting stuff.   PERTINENT HISTORY:  Shawn Daniels is a 67 y.o. male with medical history significant of DM, HTN, and PVD presenting with a fall.  He reports that his blood sugar was low.  He got up to get some glucose tabs and fell onto the fire place hearth.  He had to crawl to the bathroom.  He doesn't think his glucose is low often.  He does not normally have back pain.  He is opposed to the lidoderm, doesn't think it will help.  Brace isn't helping much, not wearing TLSO.  No radicular symptoms   Kyphoplasty 01/13/22  PAIN:  Are you having pain? Yes: NPRS scale: 5/10 Pain location: low back Pain description: soreness, tightness  Aggravating factors: overextending myself Relieving factors: Tylenol, rest  PRECAUTIONS: None  WEIGHT BEARING RESTRICTIONS: No  FALLS:  Has patient fallen in last 6 months? Yes. Number of falls 1  LIVING ENVIRONMENT: Lives with: lives with their family Lives in: House/apartment Stairs: No Has following equipment at home: None  OCCUPATION: heavy duty truck Education officer, community, and Software engineer  PLOF: Independent  PATIENT GOALS: get my back so it doesn't hurt    OBJECTIVE:   DIAGNOSTIC FINDINGS:  IMPRESSION: 1. Status post vertebrobasilar augmentation for painful compression fracture at L1 using the balloon kyphoplasty technique.   If the patient has known osteoporosis, recommend treatment as clinically indicated. If the patient's bone density status is unknown, DEXA scan is recommended  SCREENING FOR RED FLAGS: Bowel or bladder incontinence: No Spinal tumors: No Cauda equina syndrome: No Compression fracture: No Abdominal aneurysm: No  COGNITION: Overall cognitive status: Within functional limits for tasks assessed     SENSATION: WFL  MUSCLE LENGTH: Hamstrings: mod tightness BLE  POSTURE: rounded shoulders  LUMBAR  ROM:   AROM eval  Flexion WFL  Extension Very limited  Right lateral flexion Mild limitations  Left lateral flexion Mild limitations  Right rotation 50%  Left rotation 50%   (Blank rows = not tested)  LOWER EXTREMITY ROM:  WFL   LOWER EXTREMITY MMT:    MMT Right eval Left eval  Hip flexion 4 4+  Hip extension    Hip abduction 4 4  Hip adduction 5 5  Hip internal rotation    Hip external rotation    Knee flexion 5 5  Knee extension 5 5  Ankle dorsiflexion    Ankle plantarflexion    Ankle inversion    Ankle eversion     (Blank rows = not tested)  LUMBAR SPECIAL TESTS:  Straight leg raise test: Positive and FABER test: Negative  FUNCTIONAL TESTS:  5 times sit to stand: 20.39s  GAIT: Distance walked: in clinic distances Assistive device utilized: Single point cane and None Level of assistance: Complete Independence Comments: forward trunk lean, antalgic gait, decreased step length and foot clearance   TODAY'S TREATMENT:  DATE: EVAL- 02/14/23    PATIENT EDUCATION:  Education details: POC and HEP Person educated: Patient Education method: Explanation Education comprehension: verbalized understanding  HOME EXERCISE PROGRAM: Access Code: [email or web address] URL: [email or web address] Date: 02/14/2023 Prepared by: Andris Baumann  Exercises - Supine Bridge  - 1 x daily - 7 x weekly - 2 sets - 10 reps - Supine Lower Trunk Rotation  - 1 x daily - 7 x weekly - 2 sets - 10 reps - Supine Piriformis Stretch with Foot on Ground  - 1 x daily - 7 x weekly - 2 reps - 30 hold - Seated Hamstring Stretch  - 1 x daily - 7 x weekly - 2 reps - 30 hold - Supine Single Knee to Chest Stretch  - 1 x daily - 7 x weekly - 2 reps - 30 hold  ASSESSMENT:  CLINICAL IMPRESSION: Patient is a 67 y.o. male who was seen today for physical therapy evaluation and  treatment for low back pain. He had a fall and got a khypoplasty on L1. He was noncompliant with LBP precautions and wearing back brace given at hospital. He presents with some LE weakness, and decreased mobility and flexibility in his low back. Patient was educated to take things slow and not do any heavy lifting, bending, or twisting only ~4 weeks out of surgery and requires healing time. He seems to understand but states he has too many things to get done. He demonstrated increased fatigue during evaluation and functional testing. Patient will benefit from skilled PT intervention to address his low back pain and mobility to be able to return to PLOF and return to gym activities.   REHAB POTENTIAL: Good  CLINICAL DECISION MAKING: Stable/uncomplicated  EVALUATION COMPLEXITY: Low   GOALS: Goals reviewed with patient? Yes  SHORT TERM GOALS: Target date: 03/21/23  Patient will be independent with initial HEP.  Goal status: INITIAL   LONG TERM GOALS: Target date: 04/18/23  Patient will be independent with advanced/ongoing HEP to improve outcomes and carryover.  Goal status: INITIAL  2.  Patient will report 75% improvement in low back pain to improve QOL.  Baseline: 5/10 Goal status: INITIAL  3.  Patient will demonstrate full pain free lumbar ROM to perform ADLs.   Goal status: INITIAL  4.  Patient will demonstrate improved functional strength as demonstrated by <14s on STS. Baseline: 20.39s Goal status: INITIAL   PLAN:  PT FREQUENCY: 1-2x/week  PT DURATION: 8 weeks  PLANNED INTERVENTIONS: Therapeutic exercises, Therapeutic activity, Neuromuscular re-education, Balance training, Gait training, Patient/Family education, Self Care, Joint mobilization, Stair training, Dry Needling, Spinal manipulation, Spinal mobilization, Cryotherapy, Moist heat, Traction, Ionotophoresis '4mg'$ /ml Dexamethasone, and Manual therapy.  PLAN FOR NEXT SESSION: low back stretching and functional  strengthening   Andris Baumann, PT 02/13/2023, 6:51 PM

## 2023-02-18 ENCOUNTER — Other Ambulatory Visit: Payer: Managed Care, Other (non HMO)

## 2023-02-18 ENCOUNTER — Encounter: Payer: Self-pay | Admitting: Physical Therapy

## 2023-02-18 ENCOUNTER — Ambulatory Visit: Payer: Managed Care, Other (non HMO) | Admitting: Physical Therapy

## 2023-02-18 DIAGNOSIS — M5459 Other low back pain: Secondary | ICD-10-CM

## 2023-02-18 DIAGNOSIS — Z9889 Other specified postprocedural states: Secondary | ICD-10-CM

## 2023-02-18 DIAGNOSIS — M6281 Muscle weakness (generalized): Secondary | ICD-10-CM

## 2023-02-18 DIAGNOSIS — M62830 Muscle spasm of back: Secondary | ICD-10-CM

## 2023-02-18 LAB — MISC LABCORP TEST (SEND OUT)
LabCorp test name: 182261
Labcorp test code: 182261

## 2023-02-18 NOTE — Therapy (Signed)
OUTPATIENT PHYSICAL THERAPY THORACOLUMBAR EVALUATION   Patient Name: Shawn Daniels MRN: [id number] DOB:Sep 14, 1956, 67 y.o., male Today's Date: 02/13/2023  END OF SESSION:   Past Medical History:  Diagnosis Date   Diabetes mellitus without complication ([place])    GERD (gastroesophageal reflux disease)    Hypertension    Peripheral vascular disease ([place])    Past Surgical History:  Procedure Laterality Date   ABDOMINAL AORTOGRAM W/LOWER EXTREMITY Right 08/20/2022   Procedure: ABDOMINAL AORTOGRAM W/LOWER EXTREMITY;  Surgeon: Serafina Mitchell, MD;  Location: [facility];  Service: Cardiovascular;  Laterality: Right;   ABDOMINAL AORTOGRAM W/LOWER EXTREMITY N/A 11/19/2022   Procedure: ABDOMINAL AORTOGRAM W/LOWER EXTREMITY;  Surgeon: Serafina Mitchell, MD;  Location: [facility];  Service: Cardiovascular;  Laterality: N/A;   ENDARTERECTOMY FEMORAL Right 04/26/2022   Procedure: RIGHT FEMORAL ENDARTERECTOMY;  Surgeon: Serafina Mitchell, MD;  Location: [facility];  Service: Vascular;  Laterality: Right;   INTRAVASCULAR LITHOTRIPSY Left 11/19/2022   Procedure: INTRAVASCULAR LITHOTRIPSY;  Surgeon: Serafina Mitchell, MD;  Location: [facility];  Service: Cardiovascular;  Laterality: Left;   IR KYPHO LUMBAR INC FX REDUCE BONE BX UNI/BIL CANNULATION INC/IMAGING  01/13/2023   IR RADIOLOGIST EVAL & MGMT  01/29/2023   KNEE SURGERY     LOWER EXTREMITY ANGIOGRAPHY N/A 01/01/2022   Procedure: LOWER EXTREMITY ANGIOGRAPHY;  Surgeon: Nigel Mormon, MD;  Location: [facility];  Service: Cardiovascular;  Laterality: N/A;   PATCH ANGIOPLASTY Right 04/26/2022   Procedure: PATCH ANGIOPLASTY OF RIGHT FEMORAL ARTERY USING XENOSURE BOVINE [place];  Surgeon: Serafina Mitchell, MD;  [facility];  Service: Vascular;  Laterality: Right;   PENILE PROSTHESIS IMPLANT     PERIPHERAL VASCULAR ATHERECTOMY  08/20/2022   Procedure: PERIPHERAL VASCULAR ATHERECTOMY;  Surgeon: Serafina Mitchell,  MD;  Location: [facility];  Service: Cardiovascular;;  popliteal   PERIPHERAL VASCULAR BALLOON ANGIOPLASTY  01/08/2022   Procedure: PERIPHERAL VASCULAR BALLOON ANGIOPLASTY;  Surgeon: Adrian Prows, MD;  Location: [facility];  Service: Cardiovascular;;   PERIPHERAL VASCULAR INTERVENTION Left 11/19/2022   Procedure: PERIPHERAL VASCULAR INTERVENTION;  Surgeon: Serafina Mitchell, MD;  Location: [facility];  Service: Cardiovascular;  Laterality: Left;   WRIST SURGERY     Patient Active Problem List   Diagnosis Date Noted   Shock circulatory ([place]) 02/06/2023   Lumbar compression fracture, closed, initial encounter ([place]) 01/05/2023   Alcohol dependence ([place]) 01/05/2023   Sinus tachycardia 10/08/2022   PAD (peripheral artery disease) ([place]) 04/26/2022   Hypertension 04/25/2022   Insulin dependent diabetes mellitus type IA ([place]) 04/25/2022   HLD (hyperlipidemia) 04/03/2022   Claudication in peripheral vascular disease ([place]) 12/31/2021   Extensor tendon disruption 06/19/2020   Traumatic tear of supraspinatus tendon, right, initial encounter 12/02/2018   Rib contusion, right, initial encounter 12/02/2018   Trigger finger, acquired 06/29/2018   Mass of finger of left hand 11/01/2017   Closed fracture of first metacarpal bone of left hand with routine healing, subsequent encounter 09/21/2017    PCP: Jerene Bears  REFERRING PROVIDER: Tamela Gammon  REFERRING DIAG:  S/P L1 Fracture surgery      Rationale for Evaluation and Treatment: Rehabilitation  THERAPY DIAG:  No diagnosis found.  ONSET DATE: 01/05/23  SUBJECTIVE:  SUBJECTIVE STATEMENT: Patient reports that his HEP exercises are effective, but his sciatica pain seems to be exacerbated. He is having to unload some boxes at home and  may be lifting too much. He is also bending over some to reach the boxes.  PERTINENT HISTORY:  Shawn Daniels is a 67 y.o. male with medical history significant of DM, HTN, and PVD presenting with a fall.  He reports that his blood sugar was low.  He got up to get some glucose tabs and fell onto the fire place hearth.  He had to crawl to the bathroom.  He doesn't think his glucose is low often.  He does not normally have back pain.  He is opposed to the lidoderm, doesn't think it will help.  Brace isn't helping much, not wearing TLSO.  No radicular symptoms   Kyphoplasty 01/13/22  PAIN:  Are you having pain? Yes: NPRS scale: 5/10 Pain location: low back Pain description: soreness, tightness  Aggravating factors: overextending myself Relieving factors: Tylenol, rest  PRECAUTIONS: None  WEIGHT BEARING RESTRICTIONS: No  FALLS:  Has patient fallen in last 6 months? Yes. Number of falls 1  LIVING ENVIRONMENT: Lives with: lives with their family Lives in: House/apartment Stairs: No Has following equipment at home: None  OCCUPATION: heavy duty truck Education officer, community, and Software engineer  PLOF: Independent  PATIENT GOALS: get my back so it doesn't hurt    OBJECTIVE:   DIAGNOSTIC FINDINGS:  IMPRESSION: 1. Status post vertebrobasilar augmentation for painful compression fracture at L1 using the balloon kyphoplasty technique.   If the patient has known osteoporosis, recommend treatment as clinically indicated. If the patient's bone density status is unknown, DEXA scan is recommended  SCREENING FOR RED FLAGS: Bowel or bladder incontinence: No Spinal tumors: No Cauda equina syndrome: No Compression fracture: No Abdominal aneurysm: No  COGNITION: Overall cognitive status: Within functional limits for tasks assessed     SENSATION: WFL  MUSCLE LENGTH: Hamstrings: mod tightness BLE  POSTURE: rounded shoulders  LUMBAR ROM:   AROM eval  Flexion WFL  Extension Very  limited  Right lateral flexion Mild limitations  Left lateral flexion Mild limitations  Right rotation 50%  Left rotation 50%   (Blank rows = not tested)  LOWER EXTREMITY ROM:  WFL   LOWER EXTREMITY MMT:    MMT Right eval Left eval  Hip flexion 4 4+  Hip extension    Hip abduction 4 4  Hip adduction 5 5  Hip internal rotation    Hip external rotation    Knee flexion 5 5  Knee extension 5 5  Ankle dorsiflexion    Ankle plantarflexion    Ankle inversion    Ankle eversion     (Blank rows = not tested)  LUMBAR SPECIAL TESTS:  Straight leg raise test: Positive and FABER test: Negative  FUNCTIONAL TESTS:  5 times sit to stand: 20.39s  GAIT: Distance walked: in clinic distances Assistive device utilized: Single point cane and None Level of assistance: Complete Independence Comments: forward trunk lean, antalgic gait, decreased step length and foot clearance   TODAY'S TREATMENT:  DATE:  02/18/23 NuStep L5 x 6 min Sciatic assessment on R, no TTP, tightness, or TP noted in R glut or piriformis, Prox hs tendon neg for pain or tightness as well Supine stabilization exercises- Isometric hip abd/add Supine abdominal stabilization- mini marches with PPT, emphasizing form. 2 x 10 reps Quadruped stabilization, min TC to flatten back and activate trunk. Initiated alternating reaches with arms. Attempted reaching with legs, but he was too unstable through trunk, so deferred. Paloff press with 5#, 2 x 10 B. Step ups on 6" step with light UE support. Able to perform 6 on each leg before he started to compensate, so therapist stopped him.   EVAL- 02/14/23    PATIENT EDUCATION:  Education details: POC and HEP Person educated: Patient Education method: Explanation Education comprehension: verbalized understanding  HOME EXERCISE PROGRAM: Access Code:  [email or web address] URL: [email or web address] Date: 02/14/2023 Prepared by: Andris Baumann  Exercises - Supine Bridge  - 1 x daily - 7 x weekly - 2 sets - 10 reps - Supine Lower Trunk Rotation  - 1 x daily - 7 x weekly - 2 sets - 10 reps - Supine Piriformis Stretch with Foot on Ground  - 1 x daily - 7 x weekly - 2 reps - 30 hold - Seated Hamstring Stretch  - 1 x daily - 7 x weekly - 2 reps - 30 hold - Supine Single Knee to Chest Stretch  - 1 x daily - 7 x weekly - 2 reps - 30 hold  ASSESSMENT:  CLINICAL IMPRESSION: Patient reports that he has continued to move his boxes out of necessity, but is trying to divide them so they are not so heavy. Treatment continued to assess and treat his trunk stability and strength. Therapist emphasized postural control throughout, stopping when he can no longer maintain stable trunk. He required VC as he tends to push a bit. He fatigued quickly with exercises. Plan to build stability and activity tolerance together.  REHAB POTENTIAL: Good  CLINICAL DECISION MAKING: Stable/uncomplicated  EVALUATION COMPLEXITY: Low   GOALS: Goals reviewed with patient? Yes  SHORT TERM GOALS: Target date: 03/21/23  Patient will be independent with initial HEP.  Goal status: 02/18/23 met   LONG TERM GOALS: Target date: 04/18/23  Patient will be independent with advanced/ongoing HEP to improve outcomes and carryover.  Goal status: INITIAL  2.  Patient will report 75% improvement in low back pain to improve QOL.  Baseline: 5/10 Goal status: INITIAL  3.  Patient will demonstrate full pain free lumbar ROM to perform ADLs.   Goal status: INITIAL  4.  Patient will demonstrate improved functional strength as demonstrated by <14s on STS. Baseline: 20.39s Goal status: INITIAL   PLAN:  PT FREQUENCY: 1-2x/week  PT DURATION: 8 weeks  PLANNED INTERVENTIONS: Therapeutic exercises, Therapeutic activity, Neuromuscular re-education, Balance training, Gait training,  Patient/Family education, Self Care, Joint mobilization, Stair training, Dry Needling, Spinal manipulation, Spinal mobilization, Cryotherapy, Moist heat, Traction, Ionotophoresis '4mg'$ /ml Dexamethasone, and Manual therapy.  PLAN FOR NEXT SESSION: low back stretching and functional strengthening, step ups.   Ethel Rana DPT 02/18/23 1:01 PM

## 2023-02-20 ENCOUNTER — Ambulatory Visit: Payer: Managed Care, Other (non HMO)

## 2023-02-20 ENCOUNTER — Other Ambulatory Visit: Payer: Self-pay

## 2023-02-20 DIAGNOSIS — M6281 Muscle weakness (generalized): Secondary | ICD-10-CM

## 2023-02-20 DIAGNOSIS — M5459 Other low back pain: Secondary | ICD-10-CM

## 2023-02-20 DIAGNOSIS — M62830 Muscle spasm of back: Secondary | ICD-10-CM

## 2023-02-20 DIAGNOSIS — Z9889 Other specified postprocedural states: Secondary | ICD-10-CM

## 2023-02-20 NOTE — Therapy (Signed)
OUTPATIENT PHYSICAL THERAPY THORACOLUMBAR TREATMENT   Patient Name: Shawn Daniels MRN: [id number] DOB:[birth year], 67 y.o., male Today's Date: 02/13/2023  END OF SESSION:   PT End of Session - 02/20/23 1013     Visit Number 3    Date for PT Re-Evaluation 04/18/23    Authorization Type [employer]    PT Start Time 1014    PT Stop Time 1100    PT Time Calculation (min) 46 min    Activity Tolerance Patient tolerated treatment well;Patient limited by fatigue    Behavior During Therapy [facility] for tasks assessed/performed              Past Medical History:  Diagnosis Date   Diabetes mellitus without complication ([place])    GERD (gastroesophageal reflux disease)    Hypertension    Peripheral vascular disease ([place])    Past Surgical History:  Procedure Laterality Date   ABDOMINAL AORTOGRAM W/LOWER EXTREMITY Right 08/20/2022   Procedure: ABDOMINAL AORTOGRAM W/LOWER EXTREMITY;  Surgeon: Serafina Mitchell, MD;  Location: [place] [facility];  Service: Cardiovascular;  Laterality: Right;   ABDOMINAL AORTOGRAM W/LOWER EXTREMITY N/A 11/19/2022   Procedure: ABDOMINAL AORTOGRAM W/LOWER EXTREMITY;  Surgeon: Serafina Mitchell, MD;  Location: [place] [facility];  Service: Cardiovascular;  Laterality: N/A;   ENDARTERECTOMY FEMORAL Right 04/26/2022   Procedure: RIGHT FEMORAL ENDARTERECTOMY;  Surgeon: Serafina Mitchell, MD;  Location: [place];  Service: Vascular;  Laterality: Right;   INTRAVASCULAR LITHOTRIPSY Left 11/19/2022   Procedure: INTRAVASCULAR LITHOTRIPSY;  Surgeon: Serafina Mitchell, MD;  Location: [place] [facility];  Service: Cardiovascular;  Laterality: Left;   IR KYPHO LUMBAR INC FX REDUCE BONE BX UNI/BIL CANNULATION INC/IMAGING  01/13/2023   IR RADIOLOGIST EVAL & MGMT  01/29/2023   KNEE SURGERY     LOWER EXTREMITY ANGIOGRAPHY N/A 01/01/2022   Procedure: LOWER EXTREMITY ANGIOGRAPHY;  Surgeon: Nigel Mormon, MD;  Location: [place] [facility];  Service: Cardiovascular;  Laterality: N/A;    PATCH ANGIOPLASTY Right 04/26/2022   Procedure: PATCH ANGIOPLASTY OF RIGHT FEMORAL ARTERY USING XENOSURE BOVINE [place];  Surgeon: Serafina Mitchell, MD;  [facility];  Service: Vascular;  Laterality: Right;   PENILE PROSTHESIS IMPLANT     PERIPHERAL VASCULAR ATHERECTOMY  08/20/2022   Procedure: PERIPHERAL VASCULAR ATHERECTOMY;  Surgeon: Serafina Mitchell, MD;  Location: [place] [facility];  Service: Cardiovascular;;  popliteal   PERIPHERAL VASCULAR BALLOON ANGIOPLASTY  01/08/2022   Procedure: PERIPHERAL VASCULAR BALLOON ANGIOPLASTY;  Surgeon: Adrian Prows, MD;  Location: [place] [facility];  Service: Cardiovascular;;   PERIPHERAL VASCULAR INTERVENTION Left 11/19/2022   Procedure: PERIPHERAL VASCULAR INTERVENTION;  Surgeon: Serafina Mitchell, MD;  Location: [place] [facility];  Service: Cardiovascular;  Laterality: Left;   WRIST SURGERY     Patient Active Problem List   Diagnosis Date Noted   Shock circulatory ([place]) 02/06/2023   Lumbar compression fracture, closed, initial encounter ([place]) 01/05/2023   Alcohol dependence ([place]) 01/05/2023   Sinus tachycardia 10/08/2022   PAD (peripheral artery disease) ([place]) 04/26/2022   Hypertension 04/25/2022   Insulin dependent diabetes mellitus type IA ([place]) 04/25/2022   HLD (hyperlipidemia) 04/03/2022   Claudication in peripheral vascular disease ([place]) 12/31/2021   Extensor tendon disruption 06/19/2020   Traumatic tear of supraspinatus tendon, right, initial encounter 12/02/2018   Rib contusion, right, initial encounter 12/02/2018   Trigger finger, acquired 06/29/2018   Mass of finger of left hand 11/01/2017   Closed fracture of first metacarpal bone  of left hand with routine healing, subsequent encounter 09/21/2017    PCP: Jerene Bears  REFERRING PROVIDER: Tamela Gammon  REFERRING DIAG:  S/P L1 Fracture surgery      Rationale for Evaluation and Treatment: Rehabilitation  THERAPY DIAG:  No diagnosis found.  ONSET DATE:  01/05/23  SUBJECTIVE:                                                                                                                                                                                           SUBJECTIVE STATEMENT: Patient reports that his HEP exercises are effective, but his sciatica pain seems to be exacerbated. He is having to unload some boxes at home and may be lifting too much. He is also bending over some to reach the boxes.  PERTINENT HISTORY:  Shawn Daniels is a 67 y.o. male with medical history significant of DM, HTN, and PVD presenting with a fall.  He reports that his blood sugar was low.  He got up to get some glucose tabs and fell onto the fire place hearth.  He had to crawl to the bathroom.  He doesn't think his glucose is low often.  He does not normally have back pain.  He is opposed to the lidoderm, doesn't think it will help.  Brace isn't helping much, not wearing TLSO.  No radicular symptoms   Kyphoplasty 01/13/22  PAIN:  Are you having pain? Yes: NPRS scale: 5/10 Pain location: low back Pain description: soreness, tightness  Aggravating factors: overextending myself Relieving factors: Tylenol, rest  PRECAUTIONS: None  WEIGHT BEARING RESTRICTIONS: No  FALLS:  Has patient fallen in last 6 months? Yes. Number of falls 1  LIVING ENVIRONMENT: Lives with: lives with their family Lives in: House/apartment Stairs: No Has following equipment at home: None  OCCUPATION: heavy duty truck Education officer, community, and Software engineer  PLOF: Independent  PATIENT GOALS: get my back so it doesn't hurt    OBJECTIVE:   DIAGNOSTIC FINDINGS:  IMPRESSION: 1. Status post vertebrobasilar augmentation for painful compression fracture at L1 using the balloon kyphoplasty technique.   If the patient has known osteoporosis, recommend treatment as clinically indicated. If the patient's bone density status is unknown, DEXA scan is recommended  SCREENING FOR RED  FLAGS: Bowel or bladder incontinence: No Spinal tumors: No Cauda equina syndrome: No Compression fracture: No Abdominal aneurysm: No  COGNITION: Overall cognitive status: Within functional limits for tasks assessed     SENSATION: WFL  MUSCLE LENGTH: Hamstrings: mod tightness BLE  POSTURE: rounded shoulders  LUMBAR ROM:   AROM eval  Flexion WFL  Extension Very limited  Right lateral flexion  Mild limitations  Left lateral flexion Mild limitations  Right rotation 50%  Left rotation 50%   (Blank rows = not tested)  LOWER EXTREMITY ROM:  WFL   LOWER EXTREMITY MMT:    MMT Right eval Left eval  Hip flexion 4 4+  Hip extension    Hip abduction 4 4  Hip adduction 5 5  Hip internal rotation    Hip external rotation    Knee flexion 5 5  Knee extension 5 5  Ankle dorsiflexion    Ankle plantarflexion    Ankle inversion    Ankle eversion     (Blank rows = not tested)  LUMBAR SPECIAL TESTS:  Straight leg raise test: Positive and FABER test: Negative  FUNCTIONAL TESTS:  5 times sit to stand: 20.39s  GAIT: Distance walked: in clinic distances Assistive device utilized: Single point cane and None Level of assistance: Complete Independence Comments: forward trunk lean, antalgic gait, decreased step length and foot clearance   TODAY'S TREATMENT:                                                                                                                              DATE:  02/20/23: Nustep L5 6 min Supine stabilization exercises- Isometric hip abd/add with ball, belt, alternating, 5 sec holds, 10 reps Supine abdominal stabilization- mini marches with PPT, emphasizing form. 2 x 10 reps Prone with pillow under abdomen B TKE's, 5 sec holds,10 reps, alternating hamstring curls, 10 reps, alt hip ext 10 reps, 2-3 sec holds Side lying for clam shells 10 reps each Paloff press with 5#, 2 x 10 B. Step ups on 6" step with light UE support. Able to perform 6 on each leg  before he started to compensate, so therapist stopped him.  02/18/23 NuStep L5 x 6 min Sciatic assessment on R, no TTP, tightness, or TP noted in R glut or piriformis, Prox hs tendon neg for pain or tightness as well Supine stabilization exercises- Isometric hip abd/add Supine abdominal stabilization- mini marches with PPT, emphasizing form. 2 x 10 reps Quadruped stabilization, min TC to flatten back and activate trunk. Initiated alternating reaches with arms. Attempted reaching with legs, but he was too unstable through trunk, so deferred. Paloff press with 5#, 2 x 10 B. Step ups on 6" step with light UE support. Able to perform 6 on each leg before he started to compensate, so therapist stopped him.   EVAL- 02/14/23    PATIENT EDUCATION:  Education details: POC and HEP Person educated: Patient Education method: Explanation Education comprehension: verbalized understanding  HOME EXERCISE PROGRAM: Access Code: [email or web address] URL: [email or web address] Date: 02/14/2023 Prepared by: Andris Baumann  Exercises - Supine Bridge  - 1 x daily - 7 x weekly - 2 sets - 10 reps - Supine Lower Trunk Rotation  - 1 x daily - 7 x weekly - 2 sets - 10 reps - Supine Piriformis Stretch with Foot on Ground  - 1  x daily - 7 x weekly - 2 reps - 30 hold - Seated Hamstring Stretch  - 1 x daily - 7 x weekly - 2 reps - 30 hold - Supine Single Knee to Chest Stretch  - 1 x daily - 7 x weekly - 2 reps - 30 hold  ASSESSMENT:  CLINICAL IMPRESSION: Returns today for ongoing skilled PT to recover from L1 fx and kyphoplasty. Still moving boxes, but spacing out and lighter loads. Not painful R leg an more.  Added more mat activities in prone and side lying for multi directional stabilization. Tolerated well today  REHAB POTENTIAL: Good  CLINICAL DECISION MAKING: Stable/uncomplicated  EVALUATION COMPLEXITY: Low   GOALS: Goals reviewed with patient? Yes  SHORT TERM GOALS: Target date:  03/21/23  Patient will be independent with initial HEP.  Goal status: 02/18/23 met   LONG TERM GOALS: Target date: 04/18/23  Patient will be independent with advanced/ongoing HEP to improve outcomes and carryover.  Goal status: INITIAL  2.  Patient will report 75% improvement in low back pain to improve QOL.  Baseline: 5/10 Goal status: INITIAL  3.  Patient will demonstrate full pain free lumbar ROM to perform ADLs.   Goal status: INITIAL  4.  Patient will demonstrate improved functional strength as demonstrated by <14s on STS. Baseline: 20.39s Goal status: INITIAL   PLAN:  PT FREQUENCY: 1-2x/week  PT DURATION: 8 weeks  PLANNED INTERVENTIONS: Therapeutic exercises, Therapeutic activity, Neuromuscular re-education, Balance training, Gait training, Patient/Family education, Self Care, Joint mobilization, Stair training, Dry Needling, Spinal manipulation, Spinal mobilization, Cryotherapy, Moist heat, Traction, Ionotophoresis '4mg'$ /ml Dexamethasone, and Manual therapy.  PLAN FOR NEXT SESSION: low back stretching and functional strengthening, step ups.   Hayden Pedro, PT, DPT 02/20/23 10:56 AM

## 2023-02-24 ENCOUNTER — Other Ambulatory Visit: Payer: Self-pay

## 2023-02-24 ENCOUNTER — Ambulatory Visit: Payer: Medicare HMO | Attending: Student

## 2023-02-24 DIAGNOSIS — M6281 Muscle weakness (generalized): Secondary | ICD-10-CM | POA: Insufficient documentation

## 2023-02-24 DIAGNOSIS — M62830 Muscle spasm of back: Secondary | ICD-10-CM | POA: Insufficient documentation

## 2023-02-24 DIAGNOSIS — Z9889 Other specified postprocedural states: Secondary | ICD-10-CM | POA: Insufficient documentation

## 2023-02-24 DIAGNOSIS — M5459 Other low back pain: Secondary | ICD-10-CM | POA: Diagnosis not present

## 2023-02-24 NOTE — Therapy (Signed)
OUTPATIENT PHYSICAL THERAPY THORACOLUMBAR TREATMENT   Patient Name: Shawn Daniels MRN: [id number] DOB:04-20-1956, 67 y.o., male Today's Date: 02/13/2023  END OF SESSION:   PT End of Session - 02/24/23 0929     Visit Number 4    Date for PT Re-Evaluation 04/18/23    Authorization Type [employer]    PT Start Time 0930    PT Stop Time 1015    PT Time Calculation (min) 45 min              Past Medical History:  Diagnosis Date   Diabetes mellitus without complication ([facility])    GERD (gastroesophageal reflux disease)    Hypertension    Peripheral vascular disease ([place])    Past Surgical History:  Procedure Laterality Date   ABDOMINAL AORTOGRAM W/LOWER EXTREMITY Right 08/20/2022   Procedure: ABDOMINAL AORTOGRAM W/LOWER EXTREMITY;  Surgeon: Serafina Mitchell, MD;  Location: [place] [facility];  Service: Cardiovascular;  Laterality: Right;   ABDOMINAL AORTOGRAM W/LOWER EXTREMITY N/A 11/19/2022   Procedure: ABDOMINAL AORTOGRAM W/LOWER EXTREMITY;  Surgeon: Serafina Mitchell, MD;  Location: [place] [facility];  Service: Cardiovascular;  Laterality: N/A;   ENDARTERECTOMY FEMORAL Right 04/26/2022   Procedure: RIGHT FEMORAL ENDARTERECTOMY;  Surgeon: Serafina Mitchell, MD;  Location: [facility];  Service: Vascular;  Laterality: Right;   INTRAVASCULAR LITHOTRIPSY Left 11/19/2022   Procedure: INTRAVASCULAR LITHOTRIPSY;  Surgeon: Serafina Mitchell, MD;  Location: [place] [facility];  Service: Cardiovascular;  Laterality: Left;   IR KYPHO LUMBAR INC FX REDUCE BONE BX UNI/BIL CANNULATION INC/IMAGING  01/13/2023   IR RADIOLOGIST EVAL & MGMT  01/29/2023   KNEE SURGERY     LOWER EXTREMITY ANGIOGRAPHY N/A 01/01/2022   Procedure: LOWER EXTREMITY ANGIOGRAPHY;  Surgeon: Nigel Mormon, MD;  Location: [place] [facility];  Service: Cardiovascular;  Laterality: N/A;   PATCH ANGIOPLASTY Right 04/26/2022   Procedure: PATCH ANGIOPLASTY OF RIGHT FEMORAL ARTERY USING XENOSURE BOVINE [place];  Surgeon: Serafina Mitchell, MD;  [facility];  Service: Vascular;  Laterality: Right;   PENILE PROSTHESIS IMPLANT     PERIPHERAL VASCULAR ATHERECTOMY  08/20/2022   Procedure: PERIPHERAL VASCULAR ATHERECTOMY;  Surgeon: Serafina Mitchell, MD;  Location: [place] [facility];  Service: Cardiovascular;;  popliteal   PERIPHERAL VASCULAR BALLOON ANGIOPLASTY  01/08/2022   Procedure: PERIPHERAL VASCULAR BALLOON ANGIOPLASTY;  Surgeon: Adrian Prows, MD;  Location: [place] [facility];  Service: Cardiovascular;;   PERIPHERAL VASCULAR INTERVENTION Left 11/19/2022   Procedure: PERIPHERAL VASCULAR INTERVENTION;  Surgeon: Serafina Mitchell, MD;  Location: [place] [facility];  Service: Cardiovascular;  Laterality: Left;   WRIST SURGERY     Patient Active Problem List   Diagnosis Date Noted   Shock circulatory ([place]) 02/06/2023   Lumbar compression fracture, closed, initial encounter ([place]) 01/05/2023   Alcohol dependence ([place]) 01/05/2023   Sinus tachycardia 10/08/2022   PAD (peripheral artery disease) ([place]) 04/26/2022   Hypertension 04/25/2022   Insulin dependent diabetes mellitus type IA ([place]) 04/25/2022   HLD (hyperlipidemia) 04/03/2022   Claudication in peripheral vascular disease ([place]) 12/31/2021   Extensor tendon disruption 06/19/2020   Traumatic tear of supraspinatus tendon, right, initial encounter 12/02/2018   Rib contusion, right, initial encounter 12/02/2018   Trigger finger, acquired 06/29/2018   Mass of finger of left hand 11/01/2017   Closed fracture of first metacarpal bone of left hand with routine healing, subsequent encounter 09/21/2017    PCP: Jerene Bears  REFERRING PROVIDER: Tamela Gammon  REFERRING  DIAG:  S/P L1 Fracture surgery      Rationale for Evaluation and Treatment: Rehabilitation  THERAPY DIAG:  No diagnosis found.  ONSET DATE: 01/05/23  SUBJECTIVE:                                                                                                                                                                                            SUBJECTIVE STATEMENT: Patient reports that his HEP exercises are effective,getting in and out of bed easier in the morning, walking up to 2.5 miles PERTINENT HISTORY:  Shawn Daniels is a 67 y.o. male with medical history significant of DM, HTN, and PVD presenting with a fall.  He reports that his blood sugar was low.  He got up to get some glucose tabs and fell onto the fire place hearth.  He had to crawl to the bathroom.  He doesn't think his glucose is low often.  He does not normally have back pain.  He is opposed to the lidoderm, doesn't think it will help.  Brace isn't helping much, not wearing TLSO.  No radicular symptoms   Kyphoplasty 01/13/22  PAIN:  Are you having pain? Yes: NPRS scale: 310 Pain location: low back Pain description: soreness, tightness  Aggravating factors: overextending myself Relieving factors: Tylenol, rest  PRECAUTIONS: None  WEIGHT BEARING RESTRICTIONS: No  FALLS:  Has patient fallen in last 6 months? Yes. Number of falls 1  LIVING ENVIRONMENT: Lives with: lives with their family Lives in: House/apartment Stairs: No Has following equipment at home: None  OCCUPATION: heavy duty truck Education officer, community, and Software engineer  PLOF: Independent  PATIENT GOALS: get my back so it doesn't hurt    OBJECTIVE:   DIAGNOSTIC FINDINGS:  IMPRESSION: 1. Status post vertebrobasilar augmentation for painful compression fracture at L1 using the balloon kyphoplasty technique.   If the patient has known osteoporosis, recommend treatment as clinically indicated. If the patient's bone density status is unknown, DEXA scan is recommended  SCREENING FOR RED FLAGS: Bowel or bladder incontinence: No Spinal tumors: No Cauda equina syndrome: No Compression fracture: No Abdominal aneurysm: No  COGNITION: Overall cognitive status: Within functional limits for tasks assessed     SENSATION: WFL  MUSCLE  LENGTH: Hamstrings: mod tightness BLE  POSTURE: rounded shoulders  LUMBAR ROM:   AROM eval  Flexion WFL  Extension Very limited  Right lateral flexion Mild limitations  Left lateral flexion Mild limitations  Right rotation 50%  Left rotation 50%   (Blank rows = not tested)  LOWER EXTREMITY ROM:  WFL   LOWER EXTREMITY MMT:    MMT Right eval Left eval  Hip flexion 4 4+  Hip extension    Hip abduction 4 4  Hip adduction 5 5  Hip internal rotation    Hip external rotation    Knee flexion 5 5  Knee extension 5 5  Ankle dorsiflexion    Ankle plantarflexion    Ankle inversion    Ankle eversion     (Blank rows = not tested)  LUMBAR SPECIAL TESTS:  Straight leg raise test: Positive and FABER test: Negative  FUNCTIONAL TESTS:  5 times sit to stand: 20.39s  GAIT: Distance walked: in clinic distances Assistive device utilized: Single point cane and None Level of assistance: Complete Independence Comments: forward trunk lean, antalgic gait, decreased step length and foot clearance   TODAY'S TREATMENT:                                                                                                                              DATE:  02/24/23: Nustep L5 6 min Quadriped alt leg extension, alt arm ext 115 x 1 each Biceps curls 7# 15 reps each arm 3 way hip 2# 7 reps Paloff press with blue t band  2 x 10 B Step ups on 2nd step with light UE support B Push ups from rail on steps, cues to engage deep core musculature 15x  Standing post leans on wall for dead bugs emphasis on high marches to engage lower abs Standing facing wall for ladder climbs   02/20/23: Nustep L5 6 min Supine stabilization exercises- Isometric hip abd/add with ball, belt, alternating, 5 sec holds, 10 reps Supine abdominal stabilization- mini marches with PPT, emphasizing form. 2 x 10 reps Prone with pillow under abdomen B TKE's, 5 sec holds,10 reps, alternating hamstring curls, 10 reps, alt hip ext 10  reps, 2-3 sec holds Side lying for clam shells 10 reps each Paloff press with 5#, 2 x 10 B. Step ups on 6" step 15 x each with B UE support   02/18/23 NuStep L5 x 6 min Sciatic assessment on R, no TTP, tightness, or TP noted in R glut or piriformis, Prox hs tendon neg for pain or tightness as well Supine stabilization exercises- Isometric hip abd/add Supine abdominal stabilization- mini marches with PPT, emphasizing form. 2 x 10 reps Quadruped stabilization, min TC to flatten back and activate trunk. Initiated alternating reaches with arms. Attempted reaching with legs, but he was too unstable through trunk, so deferred. Paloff press with 5#, 2 x 10 B. Step ups on 6" step with light UE support. Able to perform 6 on each leg before he started to compensate, so therapist stopped him.   EVAL- 02/14/23    PATIENT EDUCATION:  Education details: POC and HEP Person educated: Patient Education method: Explanation Education comprehension: verbalized understanding  HOME EXERCISE PROGRAM: Access Code: [email or web address] URL: [email or web address] Date: 02/14/2023 Prepared by: Andris Baumann  Exercises - Supine Bridge  - 1 x daily - 7 x weekly - 2 sets -  10 reps - Supine Lower Trunk Rotation  - 1 x daily - 7 x weekly - 2 sets - 10 reps - Supine Piriformis Stretch with Foot on Ground  - 1 x daily - 7 x weekly - 2 reps - 30 hold - Seated Hamstring Stretch  - 1 x daily - 7 x weekly - 2 reps - 30 hold - Supine Single Knee to Chest Stretch  - 1 x daily - 7 x weekly - 2 reps - 30 hold  ASSESSMENT:  CLINICAL IMPRESSION: Returns today for ongoing skilled PT to recover from L1 fx and kyphoplasty 01/14/23. Advanced the intensity of his exercises, monitored for pain.  Performed more upright activities today.  Tolerated well.  Continues to benefit from skilled Pt to address his recovery of strength and function.  REHAB POTENTIAL: Good  CLINICAL DECISION MAKING: Stable/uncomplicated  EVALUATION  COMPLEXITY: Low   GOALS: Goals reviewed with patient? Yes  SHORT TERM GOALS: Target date: 03/21/23  Patient will be independent with initial HEP.  Goal status: 02/18/23 met   LONG TERM GOALS: Target date: 04/18/23  Patient will be independent with advanced/ongoing HEP to improve outcomes and carryover.  Goal status: progressing  2.  Patient will report 75% improvement in low back pain to improve QOL.  Baseline: 5/10 Goal status: progressing  3.  Patient will demonstrate full pain free lumbar ROM to perform ADLs.   Goal status: progressing  4.  Patient will demonstrate improved functional strength as demonstrated by <14s on STS. Baseline: 20.39s Goal status: progressing   PLAN:  PT FREQUENCY: 1-2x/week  PT DURATION: 8 weeks  PLANNED INTERVENTIONS: Therapeutic exercises, Therapeutic activity, Neuromuscular re-education, Balance training, Gait training, Patient/Family education, Self Care, Joint mobilization, Stair training, Dry Needling, Spinal manipulation, Spinal mobilization, Cryotherapy, Moist heat, Traction, Ionotophoresis '4mg'$ /ml Dexamethasone, and Manual therapy.  PLAN FOR NEXT SESSION: progress strengthening core, Ue's, LE's   Virdell Hoiland, PT, DPT 02/24/23 10:10 AM

## 2023-02-26 ENCOUNTER — Encounter: Payer: Self-pay | Admitting: Physical Therapy

## 2023-02-26 ENCOUNTER — Ambulatory Visit: Payer: Medicare HMO | Admitting: Physical Therapy

## 2023-02-26 DIAGNOSIS — M5459 Other low back pain: Secondary | ICD-10-CM | POA: Diagnosis not present

## 2023-02-26 DIAGNOSIS — M62830 Muscle spasm of back: Secondary | ICD-10-CM

## 2023-02-26 DIAGNOSIS — Z9889 Other specified postprocedural states: Secondary | ICD-10-CM | POA: Diagnosis not present

## 2023-02-26 DIAGNOSIS — M6281 Muscle weakness (generalized): Secondary | ICD-10-CM

## 2023-02-26 NOTE — Therapy (Signed)
OUTPATIENT PHYSICAL THERAPY THORACOLUMBAR TREATMENT   Patient Name: Shawn Daniels MRN: [id number] DOB:05/20/1956, 67 y.o., male Today's Date: 02/13/2023  END OF SESSION:   PT End of Session - 02/26/23 0934     Visit Number 5    Date for PT Re-Evaluation 04/18/23    PT Start Time 0930    PT Stop Time 1015    PT Time Calculation (min) 45 min    Activity Tolerance Patient tolerated treatment well;Patient limited by fatigue    Behavior During Therapy [facility] for tasks assessed/performed              Past Medical History:  Diagnosis Date   Diabetes mellitus without complication ([place])    GERD (gastroesophageal reflux disease)    Hypertension    Peripheral vascular disease ([place])    Past Surgical History:  Procedure Laterality Date   ABDOMINAL AORTOGRAM W/LOWER EXTREMITY Right 08/20/2022   Procedure: ABDOMINAL AORTOGRAM W/LOWER EXTREMITY;  Surgeon: Serafina Mitchell, MD;  Location: [place] [facility];  Service: Cardiovascular;  Laterality: Right;   ABDOMINAL AORTOGRAM W/LOWER EXTREMITY N/A 11/19/2022   Procedure: ABDOMINAL AORTOGRAM W/LOWER EXTREMITY;  Surgeon: Serafina Mitchell, MD;  Location: [place] [facility];  Service: Cardiovascular;  Laterality: N/A;   ENDARTERECTOMY FEMORAL Right 04/26/2022   Procedure: RIGHT FEMORAL ENDARTERECTOMY;  Surgeon: Serafina Mitchell, MD;  Location: [place];  Service: Vascular;  Laterality: Right;   INTRAVASCULAR LITHOTRIPSY Left 11/19/2022   Procedure: INTRAVASCULAR LITHOTRIPSY;  Surgeon: Serafina Mitchell, MD;  Location: [place] [facility];  Service: Cardiovascular;  Laterality: Left;   IR KYPHO LUMBAR INC FX REDUCE BONE BX UNI/BIL CANNULATION INC/IMAGING  01/13/2023   IR RADIOLOGIST EVAL & MGMT  01/29/2023   KNEE SURGERY     LOWER EXTREMITY ANGIOGRAPHY N/A 01/01/2022   Procedure: LOWER EXTREMITY ANGIOGRAPHY;  Surgeon: Nigel Mormon, MD;  Location: [place] [facility];  Service: Cardiovascular;  Laterality: N/A;   PATCH ANGIOPLASTY Right 04/26/2022    Procedure: PATCH ANGIOPLASTY OF RIGHT FEMORAL ARTERY USING XENOSURE BOVINE [place];  Surgeon: Serafina Mitchell, MD;  [facility];  Service: Vascular;  Laterality: Right;   PENILE PROSTHESIS IMPLANT     PERIPHERAL VASCULAR ATHERECTOMY  08/20/2022   Procedure: PERIPHERAL VASCULAR ATHERECTOMY;  Surgeon: Serafina Mitchell, MD;  Location: [place] [facility];  Service: Cardiovascular;;  popliteal   PERIPHERAL VASCULAR BALLOON ANGIOPLASTY  01/08/2022   Procedure: PERIPHERAL VASCULAR BALLOON ANGIOPLASTY;  Surgeon: Adrian Prows, MD;  Location: [place] [facility];  Service: Cardiovascular;;   PERIPHERAL VASCULAR INTERVENTION Left 11/19/2022   Procedure: PERIPHERAL VASCULAR INTERVENTION;  Surgeon: Serafina Mitchell, MD;  Location: [place] [facility];  Service: Cardiovascular;  Laterality: Left;   WRIST SURGERY     Patient Active Problem List   Diagnosis Date Noted   Shock circulatory ([place]) 02/06/2023   Lumbar compression fracture, closed, initial encounter ([place]) 01/05/2023   Alcohol dependence ([place]) 01/05/2023   Sinus tachycardia 10/08/2022   PAD (peripheral artery disease) ([place]) 04/26/2022   Hypertension 04/25/2022   Insulin dependent diabetes mellitus type IA ([place]) 04/25/2022   HLD (hyperlipidemia) 04/03/2022   Claudication in peripheral vascular disease ([place]) 12/31/2021   Extensor tendon disruption 06/19/2020   Traumatic tear of supraspinatus tendon, right, initial encounter 12/02/2018   Rib contusion, right, initial encounter 12/02/2018   Trigger finger, acquired 06/29/2018   Mass of finger of left hand 11/01/2017   Closed fracture of first metacarpal bone of left hand with routine healing,  subsequent encounter 09/21/2017    PCP: Jerene Bears  REFERRING PROVIDER: Tamela Gammon  REFERRING DIAG:  S/P L1 Fracture surgery      Rationale for Evaluation and Treatment: Rehabilitation  THERAPY DIAG:  No diagnosis found.  ONSET DATE: 01/05/23  SUBJECTIVE:                                                                                                                                                                                            SUBJECTIVE STATEMENT: "Not bad" PERTINENT HISTORY:  Shawn Daniels is a 67 y.o. male with medical history significant of DM, HTN, and PVD presenting with a fall.  He reports that his blood sugar was low.  He got up to get some glucose tabs and fell onto the fire place hearth.  He had to crawl to the bathroom.  He doesn't think his glucose is low often.  He does not normally have back pain.  He is opposed to the lidoderm, doesn't think it will help.  Brace isn't helping much, not wearing TLSO.  No radicular symptoms   Kyphoplasty 01/13/22  PAIN:  Are you having pain? Yes: NPRS scale: 3/10 Pain location: low back Pain description: soreness, tightness  Aggravating factors: overextending myself Relieving factors: Tylenol, rest  PRECAUTIONS: None  WEIGHT BEARING RESTRICTIONS: No  FALLS:  Has patient fallen in last 6 months? Yes. Number of falls 1  LIVING ENVIRONMENT: Lives with: lives with their family Lives in: House/apartment Stairs: No Has following equipment at home: None  OCCUPATION: heavy duty truck Education officer, community, and Software engineer  PLOF: Independent  PATIENT GOALS: get my back so it doesn't hurt    OBJECTIVE:   DIAGNOSTIC FINDINGS:  IMPRESSION: 1. Status post vertebrobasilar augmentation for painful compression fracture at L1 using the balloon kyphoplasty technique.   If the patient has known osteoporosis, recommend treatment as clinically indicated. If the patient's bone density status is unknown, DEXA scan is recommended  SCREENING FOR RED FLAGS: Bowel or bladder incontinence: No Spinal tumors: No Cauda equina syndrome: No Compression fracture: No Abdominal aneurysm: No  COGNITION: Overall cognitive status: Within functional limits for tasks assessed     SENSATION: WFL  MUSCLE  LENGTH: Hamstrings: mod tightness BLE  POSTURE: rounded shoulders  LUMBAR ROM:   AROM eval  Flexion WFL  Extension Very limited  Right lateral flexion Mild limitations  Left lateral flexion Mild limitations  Right rotation 50%  Left rotation 50%   (Blank rows = not tested)  LOWER EXTREMITY ROM:  WFL   LOWER EXTREMITY MMT:    MMT Right eval Left eval  Hip flexion 4  4+  Hip extension    Hip abduction 4 4  Hip adduction 5 5  Hip internal rotation    Hip external rotation    Knee flexion 5 5  Knee extension 5 5  Ankle dorsiflexion    Ankle plantarflexion    Ankle inversion    Ankle eversion     (Blank rows = not tested)  LUMBAR SPECIAL TESTS:  Straight leg raise test: Positive and FABER test: Negative  FUNCTIONAL TESTS:  5 times sit to stand: 20.39s  GAIT: Distance walked: in clinic distances Assistive device utilized: Single point cane and None Level of assistance: Complete Independence Comments: forward trunk lean, antalgic gait, decreased step length and foot clearance   TODAY'S TREATMENT:                                                                                                                              DATE:  02/26/23 NuStep L5 x 6 min Leg press 20lb 2x12 Seated rows 20lb and lats 15lb 2x10 Shoulder Ext 5lb 2x10 S2S holding red ball 2x10 Supine bridges 2x10 LE on pball small bridges, K2C, Oblq  02/24/23: Nustep L5 6 min Quadriped alt leg extension, alt arm ext 115 x 1 each Biceps curls 7# 15 reps each arm 3 way hip 2# 7 reps Paloff press with blue t band  2 x 10 B Step ups on 2nd step with light UE support B Push ups from rail on steps, cues to engage deep core musculature 15x  Standing post leans on wall for dead bugs emphasis on high marches to engage lower abs Standing facing wall for ladder climbs   02/20/23: Nustep L5 6 min Supine stabilization exercises- Isometric hip abd/add with ball, belt, alternating, 5 sec holds, 10 reps Supine  abdominal stabilization- mini marches with PPT, emphasizing form. 2 x 10 reps Prone with pillow under abdomen B TKE's, 5 sec holds,10 reps, alternating hamstring curls, 10 reps, alt hip ext 10 reps, 2-3 sec holds Side lying for clam shells 10 reps each Paloff press with 5#, 2 x 10 B. Step ups on 6" step 15 x each with B UE support   02/18/23 NuStep L5 x 6 min Sciatic assessment on R, no TTP, tightness, or TP noted in R glut or piriformis, Prox hs tendon neg for pain or tightness as well Supine stabilization exercises- Isometric hip abd/add Supine abdominal stabilization- mini marches with PPT, emphasizing form. 2 x 10 reps Quadruped stabilization, min TC to flatten back and activate trunk. Initiated alternating reaches with arms. Attempted reaching with legs, but he was too unstable through trunk, so deferred. Paloff press with 5#, 2 x 10 B. Step ups on 6" step with light UE support. Able to perform 6 on each leg before he started to compensate, so therapist stopped him.   EVAL- 02/14/23    PATIENT EDUCATION:  Education details: POC and HEP Person educated: Patient Education method: Explanation Education comprehension: verbalized  understanding  HOME EXERCISE PROGRAM: Access Code: [email or web address] URL: [email or web address] Date: 02/14/2023 Prepared by: Andris Baumann  Exercises - Supine Bridge  - 1 x daily - 7 x weekly - 2 sets - 10 reps - Supine Lower Trunk Rotation  - 1 x daily - 7 x weekly - 2 sets - 10 reps - Supine Piriformis Stretch with Foot on Ground  - 1 x daily - 7 x weekly - 2 reps - 30 hold - Seated Hamstring Stretch  - 1 x daily - 7 x weekly - 2 reps - 30 hold - Supine Single Knee to Chest Stretch  - 1 x daily - 7 x weekly - 2 reps - 30 hold  ASSESSMENT:  CLINICAL IMPRESSION: Returns today for ongoing skilled PT to recover from L1 fx and kyphoplasty 01/14/23. Again advanced the intensity of his exercises. No pain throughout session only light stretch at times  with new activities. Pt as a forward head and rounded shoulders at rest.   Continues to benefit from skilled Pt to address his recovery of strength and function.  REHAB POTENTIAL: Good  CLINICAL DECISION MAKING: Stable/uncomplicated  EVALUATION COMPLEXITY: Low   GOALS: Goals reviewed with patient? Yes  SHORT TERM GOALS: Target date: 03/21/23  Patient will be independent with initial HEP.  Goal status: 02/18/23 met   LONG TERM GOALS: Target date: 04/18/23  Patient will be independent with advanced/ongoing HEP to improve outcomes and carryover.  Goal status: progressing  2.  Patient will report 75% improvement in low back pain to improve QOL.  Baseline: 5/10 Goal status: progressing  3.  Patient will demonstrate full pain free lumbar ROM to perform ADLs.   Goal status: progressing  4.  Patient will demonstrate improved functional strength as demonstrated by <14s on STS. Baseline: 20.39s Goal status: progressing   PLAN:  PT FREQUENCY: 1-2x/week  PT DURATION: 8 weeks  PLANNED INTERVENTIONS: Therapeutic exercises, Therapeutic activity, Neuromuscular re-education, Balance training, Gait training, Patient/Family education, Self Care, Joint mobilization, Stair training, Dry Needling, Spinal manipulation, Spinal mobilization, Cryotherapy, Moist heat, Traction, Ionotophoresis '4mg'$ /ml Dexamethasone, and Manual therapy.  PLAN FOR NEXT SESSION: progress strengthening core, Ue's, LE's   Amy Speaks, PT, DPT 02/26/23 9:35 AM

## 2023-03-03 ENCOUNTER — Ambulatory Visit: Payer: Medicare HMO

## 2023-03-03 ENCOUNTER — Other Ambulatory Visit: Payer: Self-pay

## 2023-03-03 DIAGNOSIS — M62830 Muscle spasm of back: Secondary | ICD-10-CM

## 2023-03-03 DIAGNOSIS — M5459 Other low back pain: Secondary | ICD-10-CM

## 2023-03-03 DIAGNOSIS — Z9889 Other specified postprocedural states: Secondary | ICD-10-CM | POA: Diagnosis not present

## 2023-03-03 DIAGNOSIS — M6281 Muscle weakness (generalized): Secondary | ICD-10-CM | POA: Diagnosis not present

## 2023-03-03 NOTE — Therapy (Signed)
OUTPATIENT PHYSICAL THERAPY THORACOLUMBAR TREATMENT   Patient Name: Shawn Daniels MRN: [id number] DOB:May 23, 1956, 67 y.o., male Today's Date: 02/13/2023  END OF SESSION:   PT End of Session - 03/03/23 1201     Visit Number 6    Date for PT Re-Evaluation 04/18/23    Authorization Type [employer]    PT Start Time 1017    PT Stop Time 1100    PT Time Calculation (min) 43 min    Activity Tolerance Patient tolerated treatment well;Patient limited by fatigue    Behavior During Therapy [facility] for tasks assessed/performed               Past Medical History:  Diagnosis Date   Diabetes mellitus without complication ([place])    GERD (gastroesophageal reflux disease)    Hypertension    Peripheral vascular disease ([place])    Past Surgical History:  Procedure Laterality Date   ABDOMINAL AORTOGRAM W/LOWER EXTREMITY Right 08/20/2022   Procedure: ABDOMINAL AORTOGRAM W/LOWER EXTREMITY;  Surgeon: Serafina Mitchell, MD;  Location: [place] [facility];  Service: Cardiovascular;  Laterality: Right;   ABDOMINAL AORTOGRAM W/LOWER EXTREMITY N/A 11/19/2022   Procedure: ABDOMINAL AORTOGRAM W/LOWER EXTREMITY;  Surgeon: Serafina Mitchell, MD;  Location: [place] [facility];  Service: Cardiovascular;  Laterality: N/A;   ENDARTERECTOMY FEMORAL Right 04/26/2022   Procedure: RIGHT FEMORAL ENDARTERECTOMY;  Surgeon: Serafina Mitchell, MD;  Location: [place];  Service: Vascular;  Laterality: Right;   INTRAVASCULAR LITHOTRIPSY Left 11/19/2022   Procedure: INTRAVASCULAR LITHOTRIPSY;  Surgeon: Serafina Mitchell, MD;  Location: [place] [facility];  Service: Cardiovascular;  Laterality: Left;   IR KYPHO LUMBAR INC FX REDUCE BONE BX UNI/BIL CANNULATION INC/IMAGING  01/13/2023   IR RADIOLOGIST EVAL & MGMT  01/29/2023   KNEE SURGERY     LOWER EXTREMITY ANGIOGRAPHY N/A 01/01/2022   Procedure: LOWER EXTREMITY ANGIOGRAPHY;  Surgeon: Nigel Mormon, MD;  Location: [place] [facility];  Service: Cardiovascular;  Laterality: N/A;    PATCH ANGIOPLASTY Right 04/26/2022   Procedure: PATCH ANGIOPLASTY OF RIGHT FEMORAL ARTERY USING XENOSURE BOVINE [place];  Surgeon: Serafina Mitchell, MD;  [facility];  Service: Vascular;  Laterality: Right;   PENILE PROSTHESIS IMPLANT     PERIPHERAL VASCULAR ATHERECTOMY  08/20/2022   Procedure: PERIPHERAL VASCULAR ATHERECTOMY;  Surgeon: Serafina Mitchell, MD;  Location: [place] [facility];  Service: Cardiovascular;;  popliteal   PERIPHERAL VASCULAR BALLOON ANGIOPLASTY  01/08/2022   Procedure: PERIPHERAL VASCULAR BALLOON ANGIOPLASTY;  Surgeon: Adrian Prows, MD;  Location: [place] [facility];  Service: Cardiovascular;;   PERIPHERAL VASCULAR INTERVENTION Left 11/19/2022   Procedure: PERIPHERAL VASCULAR INTERVENTION;  Surgeon: Serafina Mitchell, MD;  Location: [place] [facility];  Service: Cardiovascular;  Laterality: Left;   WRIST SURGERY     Patient Active Problem List   Diagnosis Date Noted   Shock circulatory ([place]) 02/06/2023   Lumbar compression fracture, closed, initial encounter ([place]) 01/05/2023   Alcohol dependence ([place]) 01/05/2023   Sinus tachycardia 10/08/2022   PAD (peripheral artery disease) ([place]) 04/26/2022   Hypertension 04/25/2022   Insulin dependent diabetes mellitus type IA ([place]) 04/25/2022   HLD (hyperlipidemia) 04/03/2022   Claudication in peripheral vascular disease ([place]) 12/31/2021   Extensor tendon disruption 06/19/2020   Traumatic tear of supraspinatus tendon, right, initial encounter 12/02/2018   Rib contusion, right, initial encounter 12/02/2018   Trigger finger, acquired 06/29/2018   Mass of finger of left hand 11/01/2017   Closed fracture of first metacarpal  bone of left hand with routine healing, subsequent encounter 09/21/2017    PCP: Jerene Bears  REFERRING PROVIDER: Tamela Gammon  REFERRING DIAG:  S/P L1 Fracture surgery      Rationale for Evaluation and Treatment: Rehabilitation  THERAPY DIAG:  No diagnosis found.  ONSET DATE:  01/05/23  SUBJECTIVE:                                                                                                                                                                                           SUBJECTIVE STATEMENT: Went to gym for 35 min on Saturday, recumbent bike, leg press.  Frustrated due to my work denied family medical leave and I need to know how I get released back to work.  PERTINENT HISTORY:  Shawn Daniels is a 67 y.o. male with medical history significant of DM, HTN, and PVD presenting with a fall.  He reports that his blood sugar was low.  He got up to get some glucose tabs and fell onto the fire place hearth.  He had to crawl to the bathroom.  He doesn't think his glucose is low often.  He does not normally have back pain.  He is opposed to the lidoderm, doesn't think it will help.  Brace isn't helping much, not wearing TLSO.  No radicular symptoms   Kyphoplasty 01/13/22  PAIN:  Are you having pain? Yes: NPRS scale: 1/10 Pain location: low back Pain description: soreness, tightness  Aggravating factors: overextending myself Relieving factors: Tylenol, rest  PRECAUTIONS: None  WEIGHT BEARING RESTRICTIONS: No  FALLS:  Has patient fallen in last 6 months? Yes. Number of falls 1  LIVING ENVIRONMENT: Lives with: lives with their family Lives in: House/apartment Stairs: No Has following equipment at home: None  OCCUPATION: heavy duty truck Education officer, community, and Software engineer  PLOF: Independent  PATIENT GOALS: get my back so it doesn't hurt    OBJECTIVE:   DIAGNOSTIC FINDINGS:  IMPRESSION: 1. Status post vertebrobasilar augmentation for painful compression fracture at L1 using the balloon kyphoplasty technique.   If the patient has known osteoporosis, recommend treatment as clinically indicated. If the patient's bone density status is unknown, DEXA scan is recommended  SCREENING FOR RED FLAGS: Bowel or bladder incontinence: No Spinal tumors:  No Cauda equina syndrome: No Compression fracture: No Abdominal aneurysm: No  COGNITION: Overall cognitive status: Within functional limits for tasks assessed     SENSATION: WFL  MUSCLE LENGTH: Hamstrings: mod tightness BLE  POSTURE: rounded shoulders  LUMBAR ROM:   AROM eval  Flexion WFL  Extension Very limited  Right lateral flexion Mild limitations  Left lateral flexion  Mild limitations  Right rotation 50%  Left rotation 50%   (Blank rows = not tested)  LOWER EXTREMITY ROM:  WFL   LOWER EXTREMITY MMT:    MMT Right eval Left eval  Hip flexion 4 4+  Hip extension    Hip abduction 4 4  Hip adduction 5 5  Hip internal rotation    Hip external rotation    Knee flexion 5 5  Knee extension 5 5  Ankle dorsiflexion    Ankle plantarflexion    Ankle inversion    Ankle eversion     (Blank rows = not tested)  LUMBAR SPECIAL TESTS:  Straight leg raise test: Positive and FABER test: Negative  FUNCTIONAL TESTS:  5 times sit to stand: 20.39s  GAIT: Distance walked: in clinic distances Assistive device utilized: Single point cane and None Level of assistance: Complete Independence Comments: forward trunk lean, antalgic gait, decreased step length and foot clearance   TODAY'S TREATMENT:                                                                                                                              DATE:  03/03/23: Treadmill level 2 min, then 3.25 incline 4 min, level for final min, varying speed, 2.8 to 3.2 mph Leg press B 30lb 2x15, unilateral 20#, 2 x 15 each Seated rows 35lb and lats 25lb 2x15 Shoulder Ext 10lb 2x10 cables  S2S holding red ball 1x 15, yellow 1 x 15 Calf raises on bar: 15x 2    02/26/23 NuStep L5 x 6 min Leg press 20lb 2x12 Seated rows 20lb and lats 15lb 2x10 Shoulder Ext 5lb 2x10 S2S holding red ball 2x10 Supine bridges 2x10 LE on pball small bridges, K2C, Oblq  02/24/23: Nustep L5 6 min Quadriped alt leg extension, alt arm  ext 115 x 1 each Biceps curls 7# 15 reps each arm 3 way hip 2# 7 reps Paloff press with blue t band  2 x 10 B Step ups on 2nd step with light UE support B Push ups from rail on steps, cues to engage deep core musculature 15x  Standing post leans on wall for dead bugs emphasis on high marches to engage lower abs Standing facing wall for ladder climbs   02/20/23: Nustep L5 6 min Supine stabilization exercises- Isometric hip abd/add with ball, belt, alternating, 5 sec holds, 10 reps Supine abdominal stabilization- mini marches with PPT, emphasizing form. 2 x 10 reps Prone with pillow under abdomen B TKE's, 5 sec holds,10 reps, alternating hamstring curls, 10 reps, alt hip ext 10 reps, 2-3 sec holds Side lying for clam shells 10 reps each Paloff press with 5#, 2 x 10 B. Step ups on 6" step 15 x each with B UE support   02/18/23 NuStep L5 x 6 min Sciatic assessment on R, no TTP, tightness, or TP noted in R glut or piriformis, Prox hs tendon neg for pain or tightness as well Supine  stabilization exercises- Isometric hip abd/add Supine abdominal stabilization- mini marches with PPT, emphasizing form. 2 x 10 reps Quadruped stabilization, min TC to flatten back and activate trunk. Initiated alternating reaches with arms. Attempted reaching with legs, but he was too unstable through trunk, so deferred. Paloff press with 5#, 2 x 10 B. Step ups on 6" step with light UE support. Able to perform 6 on each leg before he started to compensate, so therapist stopped him.   EVAL- 02/14/23    PATIENT EDUCATION:  Education details: POC and HEP Person educated: Patient Education method: Explanation Education comprehension: verbalized understanding  HOME EXERCISE PROGRAM: Access Code: [email or web address] URL: [email or web address] Date: 02/14/2023 Prepared by: Andris Baumann  Exercises - Supine Bridge  - 1 x daily - 7 x weekly - 2 sets - 10 reps - Supine Lower Trunk Rotation  - 1 x daily - 7 x  weekly - 2 sets - 10 reps - Supine Piriformis Stretch with Foot on Ground  - 1 x daily - 7 x weekly - 2 reps - 30 hold - Seated Hamstring Stretch  - 1 x daily - 7 x weekly - 2 reps - 30 hold - Supine Single Knee to Chest Stretch  - 1 x daily - 7 x weekly - 2 reps - 30 hold  ASSESSMENT:  CLINICAL IMPRESSION: Returns today for ongoing skilled PT to recover from L1 fx and kyphoplasty 01/14/23. Advanced bulk strengthening exercises today for LE's, still avoiding bending, twisting with therex due to proximity of his injury. Tolerating very well, no longer getting so fatigued.  He is going to start spacing out his visits for more time between sessions instead of attending on Mon and Weds. Progressing very well at this point. REHAB POTENTIAL: Good  CLINICAL DECISION MAKING: Stable/uncomplicated  EVALUATION COMPLEXITY: Low   GOALS: Goals reviewed with patient? Yes  SHORT TERM GOALS: Target date: 03/21/23  Patient will be independent with initial HEP.  Goal status: 02/18/23 met   LONG TERM GOALS: Target date: 04/18/23  Patient will be independent with advanced/ongoing HEP to improve outcomes and carryover.  Goal status: progressing  2.  Patient will report 75% improvement in low back pain to improve QOL.  Baseline: 5/10 Goal status: progressing  3.  Patient will demonstrate full pain free lumbar ROM to perform ADLs.   Goal status: progressing  4.  Patient will demonstrate improved functional strength as demonstrated by <14s on STS. Baseline: 20.39s Goal status: progressing   PLAN:  PT FREQUENCY: 1-2x/week  PT DURATION: 8 weeks  PLANNED INTERVENTIONS: Therapeutic exercises, Therapeutic activity, Neuromuscular re-education, Balance training, Gait training, Patient/Family education, Self Care, Joint mobilization, Stair training, Dry Needling, Spinal manipulation, Spinal mobilization, Cryotherapy, Moist heat, Traction, Ionotophoresis '4mg'$ /ml Dexamethasone, and Manual therapy.  PLAN FOR  NEXT SESSION: progress strengthening core, Ue's, LE's   Jeree Delcid, PT, DPT 03/03/23 12:12 PM

## 2023-03-05 ENCOUNTER — Ambulatory Visit: Payer: Medicare HMO

## 2023-03-05 DIAGNOSIS — I10 Essential (primary) hypertension: Secondary | ICD-10-CM | POA: Diagnosis not present

## 2023-03-05 DIAGNOSIS — I739 Peripheral vascular disease, unspecified: Secondary | ICD-10-CM | POA: Diagnosis not present

## 2023-03-05 DIAGNOSIS — S32010A Wedge compression fracture of first lumbar vertebra, initial encounter for closed fracture: Secondary | ICD-10-CM | POA: Diagnosis not present

## 2023-03-05 DIAGNOSIS — E1165 Type 2 diabetes mellitus with hyperglycemia: Secondary | ICD-10-CM | POA: Diagnosis not present

## 2023-03-05 DIAGNOSIS — Z299 Encounter for prophylactic measures, unspecified: Secondary | ICD-10-CM | POA: Diagnosis not present

## 2023-03-07 LAB — CULTURE, BLOOD (ROUTINE X 2)

## 2023-03-07 NOTE — Therapy (Signed)
OUTPATIENT PHYSICAL THERAPY THORACOLUMBAR TREATMENT   Patient Name: Shawn Daniels MRN: [id number] DOB:1956-11-28, 67 y.o., male Today's Date: 02/13/2023  END OF SESSION:       Past Medical History:  Diagnosis Date   Diabetes mellitus without complication ([place])    GERD (gastroesophageal reflux disease)    Hypertension    Peripheral vascular disease ([place])    Past Surgical History:  Procedure Laterality Date   ABDOMINAL AORTOGRAM W/LOWER EXTREMITY Right 08/20/2022   Procedure: ABDOMINAL AORTOGRAM W/LOWER EXTREMITY;  Surgeon: Serafina Mitchell, MD;  Location: [facility];  Service: Cardiovascular;  Laterality: Right;   ABDOMINAL AORTOGRAM W/LOWER EXTREMITY N/A 11/19/2022   Procedure: ABDOMINAL AORTOGRAM W/LOWER EXTREMITY;  Surgeon: Serafina Mitchell, MD;  Location: [facility];  Service: Cardiovascular;  Laterality: N/A;   ENDARTERECTOMY FEMORAL Right 04/26/2022   Procedure: RIGHT FEMORAL ENDARTERECTOMY;  Surgeon: Serafina Mitchell, MD;  Location: [facility];  Service: Vascular;  Laterality: Right;   INTRAVASCULAR LITHOTRIPSY Left 11/19/2022   Procedure: INTRAVASCULAR LITHOTRIPSY;  Surgeon: Serafina Mitchell, MD;  Location: [facility];  Service: Cardiovascular;  Laterality: Left;   IR KYPHO LUMBAR INC FX REDUCE BONE BX UNI/BIL CANNULATION INC/IMAGING  01/13/2023   IR RADIOLOGIST EVAL & MGMT  01/29/2023   KNEE SURGERY     LOWER EXTREMITY ANGIOGRAPHY N/A 01/01/2022   Procedure: LOWER EXTREMITY ANGIOGRAPHY;  Surgeon: Nigel Mormon, MD;  Location: [facility];  Service: Cardiovascular;  Laterality: N/A;   PATCH ANGIOPLASTY Right 04/26/2022   Procedure: PATCH ANGIOPLASTY OF RIGHT FEMORAL ARTERY USING XENOSURE BOVINE [place];  Surgeon: Serafina Mitchell, MD;  Location: [facility];  Service: Vascular;  Laterality: Right;   PENILE PROSTHESIS IMPLANT     PERIPHERAL VASCULAR ATHERECTOMY  08/20/2022   Procedure: PERIPHERAL VASCULAR ATHERECTOMY;  Surgeon: Serafina Mitchell, MD;  Location: [facility];  Service: Cardiovascular;;  popliteal   PERIPHERAL VASCULAR BALLOON ANGIOPLASTY  01/08/2022   Procedure: PERIPHERAL VASCULAR BALLOON ANGIOPLASTY;  Surgeon: Adrian Prows, MD;  Location: [facility];  Service: Cardiovascular;;   PERIPHERAL VASCULAR INTERVENTION Left 11/19/2022   Procedure: PERIPHERAL VASCULAR INTERVENTION;  Surgeon: Serafina Mitchell, MD;  Location: [facility];  Service: Cardiovascular;  Laterality: Left;   WRIST SURGERY     Patient Active Problem List   Diagnosis Date Noted   Shock circulatory ([place]) 02/06/2023   Lumbar compression fracture, closed, initial encounter ([place]) 01/05/2023   Alcohol dependence ([place]) 01/05/2023   Sinus tachycardia 10/08/2022   PAD (peripheral artery disease) ([place]) 04/26/2022   Hypertension 04/25/2022   Insulin dependent diabetes mellitus type IA ([place]) 04/25/2022   HLD (hyperlipidemia) 04/03/2022   Claudication in peripheral vascular disease ([place]) 12/31/2021   Extensor tendon disruption 06/19/2020   Traumatic tear of supraspinatus tendon, right, initial encounter 12/02/2018   Rib contusion, right, initial encounter 12/02/2018   Trigger finger, acquired 06/29/2018   Mass of finger of left hand 11/01/2017   Closed fracture of first metacarpal bone of left hand with routine healing, subsequent encounter 09/21/2017    PCP: Jerene Bears  REFERRING PROVIDER: Tamela Gammon  REFERRING DIAG:  S/P L1 Fracture surgery      Rationale for Evaluation and Treatment: Rehabilitation  THERAPY DIAG:  No diagnosis found.  ONSET DATE: 01/05/23  SUBJECTIVE:  SUBJECTIVE STATEMENT: Went to gym for 35 min on Saturday, recumbent bike, leg press.  Frustrated due to my work denied family medical leave and I need to know  how I get released back to work.   PERTINENT HISTORY:  ISIAS Shawn Daniels is a 67 y.o. male with medical history significant of DM, HTN, and PVD presenting with a fall.  He reports that his blood sugar was low.  He got up to get some glucose tabs and fell onto the fire place hearth.  He had to crawl to the bathroom.  He doesn't think his glucose is low often.  He does not normally have back pain.  He is opposed to the lidoderm, doesn't think it will help.  Brace isn't helping much, not wearing TLSO.  No radicular symptoms   Kyphoplasty 01/13/22  PAIN:  Are you having pain? Yes: NPRS scale: 1/10 Pain location: low back Pain description: soreness, tightness  Aggravating factors: overextending myself Relieving factors: Tylenol, rest  PRECAUTIONS: None  WEIGHT BEARING RESTRICTIONS: No  FALLS:  Has patient fallen in last 6 months? Yes. Number of falls 1  LIVING ENVIRONMENT: Lives with: lives with their family Lives in: House/apartment Stairs: No Has following equipment at home: None  OCCUPATION: heavy duty truck Education officer, community, and Software engineer  PLOF: Independent  PATIENT GOALS: get my back so it doesn't hurt    OBJECTIVE:   DIAGNOSTIC FINDINGS:  IMPRESSION: 1. Status post vertebrobasilar augmentation for painful compression fracture at L1 using the balloon kyphoplasty technique.   If the patient has known osteoporosis, recommend treatment as clinically indicated. If the patient's bone density status is unknown, DEXA scan is recommended  SCREENING FOR RED FLAGS: Bowel or bladder incontinence: No Spinal tumors: No Cauda equina syndrome: No Compression fracture: No Abdominal aneurysm: No  COGNITION: Overall cognitive status: Within functional limits for tasks assessed     SENSATION: WFL  MUSCLE LENGTH: Hamstrings: mod tightness BLE  POSTURE: rounded shoulders  LUMBAR ROM:   AROM eval  Flexion WFL  Extension Very limited  Right lateral flexion Mild  limitations  Left lateral flexion Mild limitations  Right rotation 50%  Left rotation 50%   (Blank rows = not tested)  LOWER EXTREMITY ROM:  WFL   LOWER EXTREMITY MMT:    MMT Right eval Left eval  Hip flexion 4 4+  Hip extension    Hip abduction 4 4  Hip adduction 5 5  Hip internal rotation    Hip external rotation    Knee flexion 5 5  Knee extension 5 5  Ankle dorsiflexion    Ankle plantarflexion    Ankle inversion    Ankle eversion     (Blank rows = not tested)  LUMBAR SPECIAL TESTS:  Straight leg raise test: Positive and FABER test: Negative  FUNCTIONAL TESTS:  5 times sit to stand: 20.39s  GAIT: Distance walked: in clinic distances Assistive device utilized: Single point cane and None Level of assistance: Complete Independence Comments: forward trunk lean, antalgic gait, decreased step length and foot clearance   TODAY'S TREATMENT:  DATE:  03/10/23 NuStep Standing shoulder ext Cable rows  Horizontal abd  Deadbugs Ab ball roll ups     03/03/23: Treadmill level 2 min, then 3.25 incline 4 min, level for final min, varying speed, 2.8 to 3.2 mph Leg press B 30lb 2x15, unilateral 20#, 2 x 15 each Seated rows 35lb and lats 25lb 2x15 Shoulder Ext 10lb 2x10 cables  S2S holding red ball 1x 15, yellow 1 x 15 Calf raises on bar: 15x 2    02/26/23 NuStep L5 x 6 min Leg press 20lb 2x12 Seated rows 20lb and lats 15lb 2x10 Shoulder Ext 5lb 2x10 S2S holding red ball 2x10 Supine bridges 2x10 LE on pball small bridges, K2C, Oblq  02/24/23: Nustep L5 6 min Quadriped alt leg extension, alt arm ext 115 x 1 each Biceps curls 7# 15 reps each arm 3 way hip 2# 7 reps Paloff press with blue t band  2 x 10 B Step ups on 2nd step with light UE support B Push ups from rail on steps, cues to engage deep core musculature 15x  Standing post leans on  wall for dead bugs emphasis on high marches to engage lower abs Standing facing wall for ladder climbs   02/20/23: Nustep L5 6 min Supine stabilization exercises- Isometric hip abd/add with ball, belt, alternating, 5 sec holds, 10 reps Supine abdominal stabilization- mini marches with PPT, emphasizing form. 2 x 10 reps Prone with pillow under abdomen B TKE's, 5 sec holds,10 reps, alternating hamstring curls, 10 reps, alt hip ext 10 reps, 2-3 sec holds Side lying for clam shells 10 reps each Paloff press with 5#, 2 x 10 B. Step ups on 6" step 15 x each with B UE support   02/18/23 NuStep L5 x 6 min Sciatic assessment on R, no TTP, tightness, or TP noted in R glut or piriformis, Prox hs tendon neg for pain or tightness as well Supine stabilization exercises- Isometric hip abd/add Supine abdominal stabilization- mini marches with PPT, emphasizing form. 2 x 10 reps Quadruped stabilization, min TC to flatten back and activate trunk. Initiated alternating reaches with arms. Attempted reaching with legs, but he was too unstable through trunk, so deferred. Paloff press with 5#, 2 x 10 B. Step ups on 6" step with light UE support. Able to perform 6 on each leg before he started to compensate, so therapist stopped him.   EVAL- 02/14/23    PATIENT EDUCATION:  Education details: POC and HEP Person educated: Patient Education method: Explanation Education comprehension: verbalized understanding  HOME EXERCISE PROGRAM: Access Code: [email or web address] URL: [email or web address] Date: 02/14/2023 Prepared by: Andris Baumann  Exercises - Supine Bridge  - 1 x daily - 7 x weekly - 2 sets - 10 reps - Supine Lower Trunk Rotation  - 1 x daily - 7 x weekly - 2 sets - 10 reps - Supine Piriformis Stretch with Foot on Ground  - 1 x daily - 7 x weekly - 2 reps - 30 hold - Seated Hamstring Stretch  - 1 x daily - 7 x weekly - 2 reps - 30 hold - Supine Single Knee to Chest Stretch  - 1 x daily - 7 x weekly  - 2 reps - 30 hold  ASSESSMENT:  CLINICAL IMPRESSION: Returns today for ongoing skilled PT to recover from L1 fx and kyphoplasty 01/14/23. Advanced bulk strengthening exercises today for LE's, still avoiding bending, twisting with therex due to proximity of his injury. Tolerating very well, no longer getting so fatigued.  He is going to start spacing out his visits for more time between sessions instead of attending on Mon and Weds. Progressing very well at this point.    REHAB POTENTIAL: Good  CLINICAL DECISION MAKING: Stable/uncomplicated  EVALUATION COMPLEXITY: Low   GOALS: Goals reviewed with patient? Yes  SHORT TERM GOALS: Target date: 03/21/23  Patient will be independent with initial HEP.  Goal status: 02/18/23 met   LONG TERM GOALS: Target date: 04/18/23  Patient will be independent with advanced/ongoing HEP to improve outcomes and carryover.  Goal status: progressing  2.  Patient will report 75% improvement in low back pain to improve QOL.  Baseline: 5/10 Goal status: progressing  3.  Patient will demonstrate full pain free lumbar ROM to perform ADLs.   Goal status: progressing  4.  Patient will demonstrate improved functional strength as demonstrated by <14s on STS. Baseline: 20.39s Goal status: progressing   PLAN:  PT FREQUENCY: 1-2x/week  PT DURATION: 8 weeks  PLANNED INTERVENTIONS: Therapeutic exercises, Therapeutic activity, Neuromuscular re-education, Balance training, Gait training, Patient/Family education, Self Care, Joint mobilization, Stair training, Dry Needling, Spinal manipulation, Spinal mobilization, Cryotherapy, Moist heat, Traction, Ionotophoresis 4mg /ml Dexamethasone, and Manual therapy.  PLAN FOR NEXT SESSION: progress strengthening core, Ue's, LE's   Amy Speaks, PT, DPT 03/07/23 7:52 AM

## 2023-03-10 ENCOUNTER — Ambulatory Visit: Payer: Medicare HMO

## 2023-03-10 DIAGNOSIS — M6281 Muscle weakness (generalized): Secondary | ICD-10-CM | POA: Diagnosis not present

## 2023-03-10 DIAGNOSIS — M62830 Muscle spasm of back: Secondary | ICD-10-CM

## 2023-03-10 DIAGNOSIS — Z9889 Other specified postprocedural states: Secondary | ICD-10-CM

## 2023-03-10 DIAGNOSIS — M5459 Other low back pain: Secondary | ICD-10-CM | POA: Diagnosis not present

## 2023-03-12 ENCOUNTER — Ambulatory Visit: Payer: Medicare HMO

## 2023-03-12 DIAGNOSIS — M5459 Other low back pain: Secondary | ICD-10-CM | POA: Diagnosis not present

## 2023-03-12 DIAGNOSIS — M62830 Muscle spasm of back: Secondary | ICD-10-CM | POA: Diagnosis not present

## 2023-03-12 DIAGNOSIS — Z9889 Other specified postprocedural states: Secondary | ICD-10-CM

## 2023-03-12 DIAGNOSIS — M6281 Muscle weakness (generalized): Secondary | ICD-10-CM

## 2023-03-12 NOTE — Therapy (Signed)
OUTPATIENT PHYSICAL THERAPY THORACOLUMBAR TREATMENT   Patient Name: [name] MRN: [id number] DOB:12-04-1956, 67 y.o., male Today's Date: 02/13/2023  END OF SESSION:   PT End of Session - 03/12/23 1018     Visit Number 8    Date for PT Re-Evaluation 04/18/23    Authorization Type [employer]    PT Start Time 1015    PT Stop Time 1100    PT Time Calculation (min) 45 min    Activity Tolerance Patient tolerated treatment well;Patient limited by fatigue    Behavior During Therapy [facility] for tasks assessed/performed                 Past Medical History:  Diagnosis Date   Diabetes mellitus without complication ([place])    GERD (gastroesophageal reflux disease)    Hypertension    Peripheral vascular disease ([place])    Past Surgical History:  Procedure Laterality Date   ABDOMINAL AORTOGRAM W/LOWER EXTREMITY Right 08/20/2022   Procedure: ABDOMINAL AORTOGRAM W/LOWER EXTREMITY;  Surgeon: Serafina Mitchell, MD;  Location: [facility];  Service: Cardiovascular;  Laterality: Right;   ABDOMINAL AORTOGRAM W/LOWER EXTREMITY N/A 11/19/2022   Procedure: ABDOMINAL AORTOGRAM W/LOWER EXTREMITY;  Surgeon: Serafina Mitchell, MD;  Location: [facility];  Service: Cardiovascular;  Laterality: N/A;   ENDARTERECTOMY FEMORAL Right 04/26/2022   Procedure: RIGHT FEMORAL ENDARTERECTOMY;  Surgeon: Serafina Mitchell, MD;  Location: [facility];  Service: Vascular;  Laterality: Right;   INTRAVASCULAR LITHOTRIPSY Left 11/19/2022   Procedure: INTRAVASCULAR LITHOTRIPSY;  Surgeon: Serafina Mitchell, MD;  Location: [facility];  Service: Cardiovascular;  Laterality: Left;   IR KYPHO LUMBAR INC FX REDUCE BONE BX UNI/BIL CANNULATION INC/IMAGING  01/13/2023   IR RADIOLOGIST EVAL & MGMT  01/29/2023   KNEE SURGERY     LOWER EXTREMITY ANGIOGRAPHY N/A 01/01/2022   Procedure: LOWER EXTREMITY ANGIOGRAPHY;  Surgeon: Nigel Mormon, MD;  Location: [facility];  Service: Cardiovascular;  Laterality: N/A;    PATCH ANGIOPLASTY Right 04/26/2022   Procedure: PATCH ANGIOPLASTY OF RIGHT FEMORAL ARTERY USING XENOSURE BOVINE [place];  Surgeon: Serafina Mitchell, MD;  [facility];  Service: Vascular;  Laterality: Right;   PENILE PROSTHESIS IMPLANT     PERIPHERAL VASCULAR ATHERECTOMY  08/20/2022   Procedure: PERIPHERAL VASCULAR ATHERECTOMY;  Surgeon: Serafina Mitchell, MD;  Location: [facility];  Service: Cardiovascular;;  popliteal   PERIPHERAL VASCULAR BALLOON ANGIOPLASTY  01/08/2022   Procedure: PERIPHERAL VASCULAR BALLOON ANGIOPLASTY;  Surgeon: Adrian Prows, MD;  Location: [facility];  Service: Cardiovascular;;   PERIPHERAL VASCULAR INTERVENTION Left 11/19/2022   Procedure: PERIPHERAL VASCULAR INTERVENTION;  Surgeon: Serafina Mitchell, MD;  Location: [facility];  Service: Cardiovascular;  Laterality: Left;   WRIST SURGERY     Patient Active Problem List   Diagnosis Date Noted   Shock circulatory ([place]) 02/06/2023   Lumbar compression fracture, closed, initial encounter ([place]) 01/05/2023   Alcohol dependence ([place]) 01/05/2023   Sinus tachycardia 10/08/2022   PAD (peripheral artery disease) ([place]) 04/26/2022   Hypertension 04/25/2022   Insulin dependent diabetes mellitus type IA ([place]) 04/25/2022   HLD (hyperlipidemia) 04/03/2022   Claudication in peripheral vascular disease ([place]) 12/31/2021   Extensor tendon disruption 06/19/2020   Traumatic tear of supraspinatus tendon, right, initial encounter 12/02/2018   Rib contusion, right, initial encounter 12/02/2018   Trigger finger, acquired 06/29/2018   Mass of finger of left hand 11/01/2017   Closed fracture of  first metacarpal bone of left hand with routine healing, subsequent encounter 09/21/2017    PCP: Jerene Bears  REFERRING PROVIDER: Tamela Gammon  REFERRING DIAG:  S/P L1 Fracture surgery      Rationale for Evaluation and Treatment: Rehabilitation  THERAPY DIAG:  No diagnosis found.  ONSET DATE:  01/05/23  SUBJECTIVE:                                                                                                                                                                                           SUBJECTIVE STATEMENT: Not too bad, I was sore in my abs.   PERTINENT HISTORY:  Shawn Daniels is a 67 y.o. male with medical history significant of DM, HTN, and PVD presenting with a fall.  He reports that his blood sugar was low.  He got up to get some glucose tabs and fell onto the fire place hearth.  He had to crawl to the bathroom.  He doesn't think his glucose is low often.  He does not normally have back pain.  He is opposed to the lidoderm, doesn't think it will help.  Brace isn't helping much, not wearing TLSO.  No radicular symptoms   Kyphoplasty 01/13/22  PAIN:  Are you having pain? Yes: NPRS scale: 1/10 Pain location: low back Pain description: soreness, tightness  Aggravating factors: overextending myself Relieving factors: Tylenol, rest  PRECAUTIONS: None  WEIGHT BEARING RESTRICTIONS: No  FALLS:  Has patient fallen in last 6 months? Yes. Number of falls 1  LIVING ENVIRONMENT: Lives with: lives with their family Lives in: House/apartment Stairs: No Has following equipment at home: None  OCCUPATION: heavy duty truck Education officer, community, and Software engineer  PLOF: Independent  PATIENT GOALS: get my back so it doesn't hurt    OBJECTIVE:   DIAGNOSTIC FINDINGS:  IMPRESSION: 1. Status post vertebrobasilar augmentation for painful compression fracture at L1 using the balloon kyphoplasty technique.   If the patient has known osteoporosis, recommend treatment as clinically indicated. If the patient's bone density status is unknown, DEXA scan is recommended  SCREENING FOR RED FLAGS: Bowel or bladder incontinence: No Spinal tumors: No Cauda equina syndrome: No Compression fracture: No Abdominal aneurysm: No  COGNITION: Overall cognitive status: Within  functional limits for tasks assessed     SENSATION: WFL  MUSCLE LENGTH: Hamstrings: mod tightness BLE  POSTURE: rounded shoulders  LUMBAR ROM:   AROM eval  Flexion WFL  Extension Very limited  Right lateral flexion Mild limitations  Left lateral flexion Mild limitations  Right rotation 50%  Left rotation 50%   (Blank rows = not tested)  LOWER EXTREMITY ROM:  WFL   LOWER EXTREMITY MMT:    MMT Right eval Left eval  Hip flexion 4 4+  Hip extension    Hip abduction 4 4  Hip adduction 5 5  Hip internal rotation    Hip external rotation    Knee flexion 5 5  Knee extension 5 5  Ankle dorsiflexion    Ankle plantarflexion    Ankle inversion    Ankle eversion     (Blank rows = not tested)  LUMBAR SPECIAL TESTS:  Straight leg raise test: Positive and FABER test: Negative  FUNCTIONAL TESTS:  5 times sit to stand: 20.39s  GAIT: Distance walked: in clinic distances Assistive device utilized: Single point cane and None Level of assistance: Complete Independence Comments: forward trunk lean, antalgic gait, decreased step length and foot clearance   TODAY'S TREATMENT:                                                                                                                              DATE:  03/12/23 Pball flexion and rotations x10 each way  AR press 15# 2x10  Seated lats/rows 35# 2x10 blackTB crunches 2x10  Calf stretch 30s  Calf raises 2x12 Modified sit ups from wedge 2x10 HS stretch 30s x2 each side   03/10/23 Bike L4 x64mins Standing shoulder ext 10# x12, 15# x12 Cable rows 15# x12, 20# x12  Horizontal abd green 2x12 Deadbugs 2x10 Ab ball roll ups 2x10 Stretching HS, SK2C, double K2C, piriformis 30s each  Trunk rotations x10 Blacktb ext 2x10 Leg press 40# 2x10    03/03/23: Treadmill level 2 min, then 3.25 incline 4 min, level for final min, varying speed, 2.8 to 3.2 mph Leg press B 30lb 2x15, unilateral 20#, 2 x 15 each Seated rows 35lb and lats  25lb 2x15 Shoulder Ext 10lb 2x10 cables  S2S holding red ball 1x 15, yellow 1 x 15 Calf raises on bar: 15x 2    02/26/23 NuStep L5 x 6 min Leg press 20lb 2x12 Seated rows 20lb and lats 15lb 2x10 Shoulder Ext 5lb 2x10 S2S holding red ball 2x10 Supine bridges 2x10 LE on pball small bridges, K2C, Oblq  02/24/23: Nustep L5 6 min Quadriped alt leg extension, alt arm ext 115 x 1 each Biceps curls 7# 15 reps each arm 3 way hip 2# 7 reps Paloff press with blue t band  2 x 10 B Step ups on 2nd step with light UE support B Push ups from rail on steps, cues to engage deep core musculature 15x  Standing post leans on wall for dead bugs emphasis on high marches to engage lower abs Standing facing wall for ladder climbs   02/20/23: Nustep L5 6 min Supine stabilization exercises- Isometric hip abd/add with ball, belt, alternating, 5 sec holds, 10 reps Supine abdominal stabilization- mini marches with PPT, emphasizing form. 2 x 10 reps Prone with pillow under abdomen B TKE's, 5 sec holds,10 reps, alternating hamstring curls,  10 reps, alt hip ext 10 reps, 2-3 sec holds Side lying for clam shells 10 reps each Paloff press with 5#, 2 x 10 B. Step ups on 6" step 15 x each with B UE support   02/18/23 NuStep L5 x 6 min Sciatic assessment on R, no TTP, tightness, or TP noted in R glut or piriformis, Prox hs tendon neg for pain or tightness as well Supine stabilization exercises- Isometric hip abd/add Supine abdominal stabilization- mini marches with PPT, emphasizing form. 2 x 10 reps Quadruped stabilization, min TC to flatten back and activate trunk. Initiated alternating reaches with arms. Attempted reaching with legs, but he was too unstable through trunk, so deferred. Paloff press with 5#, 2 x 10 B. Step ups on 6" step with light UE support. Able to perform 6 on each leg before he started to compensate, so therapist stopped him.   EVAL- 02/14/23    PATIENT EDUCATION:  Education details: POC  and HEP Person educated: Patient Education method: Explanation Education comprehension: verbalized understanding  HOME EXERCISE PROGRAM: Access Code: [email or web address] URL: [email or web address] Date: 02/14/2023 Prepared by: Andris Baumann  Exercises - Supine Bridge  - 1 x daily - 7 x weekly - 2 sets - 10 reps - Supine Lower Trunk Rotation  - 1 x daily - 7 x weekly - 2 sets - 10 reps - Supine Piriformis Stretch with Foot on Ground  - 1 x daily - 7 x weekly - 2 reps - 30 hold - Seated Hamstring Stretch  - 1 x daily - 7 x weekly - 2 reps - 30 hold - Supine Single Knee to Chest Stretch  - 1 x daily - 7 x weekly - 2 reps - 30 hold  ASSESSMENT:  CLINICAL IMPRESSION: patient doing well, reports low pain levels. Was sore after last visit. States everything has really been helping and he is feeling better overall. Wants to continue working on core and stretching.   REHAB POTENTIAL: Good  CLINICAL DECISION MAKING: Stable/uncomplicated  EVALUATION COMPLEXITY: Low   GOALS: Goals reviewed with patient? Yes  SHORT TERM GOALS: Target date: 03/21/23  Patient will be independent with initial HEP.  Goal status: 02/18/23 met   LONG TERM GOALS: Target date: 04/18/23  Patient will be independent with advanced/ongoing HEP to improve outcomes and carryover.  Goal status: progressing  2.  Patient will report 75% improvement in low back pain to improve QOL.  Baseline: 5/10 Goal status: progressing  3.  Patient will demonstrate full pain free lumbar ROM to perform ADLs.   Goal status: progressing  4.  Patient will demonstrate improved functional strength as demonstrated by <14s on STS. Baseline: 20.39s Goal status: progressing   PLAN:  PT FREQUENCY: 1-2x/week  PT DURATION: 8 weeks  PLANNED INTERVENTIONS: Therapeutic exercises, Therapeutic activity, Neuromuscular re-education, Balance training, Gait training, Patient/Family education, Self Care, Joint mobilization, Stair training,  Dry Needling, Spinal manipulation, Spinal mobilization, Cryotherapy, Moist heat, Traction, Ionotophoresis 4mg /ml Dexamethasone, and Manual therapy.  PLAN FOR NEXT SESSION: progress strengthening core, Ue's, LE's, AR press, lats/rows, STS with press    Andris Baumann, PT, DPT 03/12/23 11:00 AM

## 2023-03-13 DIAGNOSIS — E1159 Type 2 diabetes mellitus with other circulatory complications: Secondary | ICD-10-CM | POA: Diagnosis not present

## 2023-03-13 DIAGNOSIS — J069 Acute upper respiratory infection, unspecified: Secondary | ICD-10-CM | POA: Diagnosis not present

## 2023-03-13 DIAGNOSIS — Z299 Encounter for prophylactic measures, unspecified: Secondary | ICD-10-CM | POA: Diagnosis not present

## 2023-03-13 DIAGNOSIS — I152 Hypertension secondary to endocrine disorders: Secondary | ICD-10-CM | POA: Diagnosis not present

## 2023-03-13 DIAGNOSIS — R059 Cough, unspecified: Secondary | ICD-10-CM | POA: Diagnosis not present

## 2023-03-19 ENCOUNTER — Encounter: Payer: Self-pay | Admitting: Physical Therapy

## 2023-03-19 ENCOUNTER — Ambulatory Visit: Payer: Medicare HMO | Admitting: Physical Therapy

## 2023-03-19 DIAGNOSIS — M6281 Muscle weakness (generalized): Secondary | ICD-10-CM | POA: Diagnosis not present

## 2023-03-19 DIAGNOSIS — Z9889 Other specified postprocedural states: Secondary | ICD-10-CM

## 2023-03-19 DIAGNOSIS — M62830 Muscle spasm of back: Secondary | ICD-10-CM | POA: Diagnosis not present

## 2023-03-19 DIAGNOSIS — M5459 Other low back pain: Secondary | ICD-10-CM | POA: Diagnosis not present

## 2023-03-19 NOTE — Therapy (Signed)
OUTPATIENT PHYSICAL THERAPY THORACOLUMBAR TREATMENT   Patient Name: Shawn Daniels MRN: [id number] DOB:September 16, 1956, 67 y.o., male Today's Date: 02/13/2023  END OF SESSION:   PT End of Session - 03/19/23 1150     Visit Number 9    Date for PT Re-Evaluation 04/18/23    PT Start Time 1146    PT Stop Time 1225    PT Time Calculation (min) 39 min    Activity Tolerance Patient tolerated treatment well;Patient limited by fatigue    Behavior During Therapy [facility] for tasks assessed/performed                  Past Medical History:  Diagnosis Date   Diabetes mellitus without complication ([place])    GERD (gastroesophageal reflux disease)    Hypertension    Peripheral vascular disease ([place])    Past Surgical History:  Procedure Laterality Date   ABDOMINAL AORTOGRAM W/LOWER EXTREMITY Right 08/20/2022   Procedure: ABDOMINAL AORTOGRAM W/LOWER EXTREMITY;  Surgeon: Serafina Mitchell, MD;  Location: [place] [facility];  Service: Cardiovascular;  Laterality: Right;   ABDOMINAL AORTOGRAM W/LOWER EXTREMITY N/A 11/19/2022   Procedure: ABDOMINAL AORTOGRAM W/LOWER EXTREMITY;  Surgeon: Serafina Mitchell, MD;  Location: [place] [facility];  Service: Cardiovascular;  Laterality: N/A;   ENDARTERECTOMY FEMORAL Right 04/26/2022   Procedure: RIGHT FEMORAL ENDARTERECTOMY;  Surgeon: Serafina Mitchell, MD;  Location: [place];  Service: Vascular;  Laterality: Right;   INTRAVASCULAR LITHOTRIPSY Left 11/19/2022   Procedure: INTRAVASCULAR LITHOTRIPSY;  Surgeon: Serafina Mitchell, MD;  Location: [place] [facility];  Service: Cardiovascular;  Laterality: Left;   IR KYPHO LUMBAR INC FX REDUCE BONE BX UNI/BIL CANNULATION INC/IMAGING  01/13/2023   IR RADIOLOGIST EVAL & MGMT  01/29/2023   KNEE SURGERY     LOWER EXTREMITY ANGIOGRAPHY N/A 01/01/2022   Procedure: LOWER EXTREMITY ANGIOGRAPHY;  Surgeon: Nigel Mormon, MD;  Location: [place] [facility];  Service: Cardiovascular;  Laterality: N/A;   PATCH ANGIOPLASTY Right  04/26/2022   Procedure: PATCH ANGIOPLASTY OF RIGHT FEMORAL ARTERY USING XENOSURE BOVINE [place];  Surgeon: Serafina Mitchell, MD;  Location: [facility];  Service: Vascular;  Laterality: Right;   PENILE PROSTHESIS IMPLANT     PERIPHERAL VASCULAR ATHERECTOMY  08/20/2022   Procedure: PERIPHERAL VASCULAR ATHERECTOMY;  Surgeon: Serafina Mitchell, MD;  Location: [place] [facility];  Service: Cardiovascular;;  popliteal   PERIPHERAL VASCULAR BALLOON ANGIOPLASTY  01/08/2022   Procedure: PERIPHERAL VASCULAR BALLOON ANGIOPLASTY;  Surgeon: Adrian Prows, MD;  Location: [place] [facility];  Service: Cardiovascular;;   PERIPHERAL VASCULAR INTERVENTION Left 11/19/2022   Procedure: PERIPHERAL VASCULAR INTERVENTION;  Surgeon: Serafina Mitchell, MD;  Location: [place] [facility];  Service: Cardiovascular;  Laterality: Left;   WRIST SURGERY     Patient Active Problem List   Diagnosis Date Noted   Shock circulatory ([place]) 02/06/2023   Lumbar compression fracture, closed, initial encounter ([place]) 01/05/2023   Alcohol dependence ([place]) 01/05/2023   Sinus tachycardia 10/08/2022   PAD (peripheral artery disease) ([place]) 04/26/2022   Hypertension 04/25/2022   Insulin dependent diabetes mellitus type IA ([place]) 04/25/2022   HLD (hyperlipidemia) 04/03/2022   Claudication in peripheral vascular disease ([place]) 12/31/2021   Extensor tendon disruption 06/19/2020   Traumatic tear of supraspinatus tendon, right, initial encounter 12/02/2018   Rib contusion, right, initial encounter 12/02/2018   Trigger finger, acquired 06/29/2018   Mass of finger of left hand 11/01/2017   Closed fracture of first metacarpal bone of left  hand with routine healing, subsequent encounter 09/21/2017    PCP: Jerene Bears  REFERRING PROVIDER: Tamela Gammon  REFERRING DIAG:  S/P L1 Fracture surgery      Rationale for Evaluation and Treatment: Rehabilitation  THERAPY DIAG:  No diagnosis found.  ONSET DATE: 01/05/23  SUBJECTIVE:                                                                                                                                                                                            SUBJECTIVE STATEMENT: Not too bad, I was sore in my abs.   PERTINENT HISTORY:  Shawn Daniels is a 67 y.o. male with medical history significant of DM, HTN, and PVD presenting with a fall.  He reports that his blood sugar was low.  He got up to get some glucose tabs and fell onto the fire place hearth.  He had to crawl to the bathroom.  He doesn't think his glucose is low often.  He does not normally have back pain.  He is opposed to the lidoderm, doesn't think it will help.  Brace isn't helping much, not wearing TLSO.  No radicular symptoms   Kyphoplasty 01/13/22  PAIN:  Are you having pain? Yes: NPRS scale: 1/10 Pain location: low back Pain description: soreness, tightness  Aggravating factors: overextending myself Relieving factors: Tylenol, rest  PRECAUTIONS: None  WEIGHT BEARING RESTRICTIONS: No  FALLS:  Has patient fallen in last 6 months? Yes. Number of falls 1  LIVING ENVIRONMENT: Lives with: lives with their family Lives in: House/apartment Stairs: No Has following equipment at home: None  OCCUPATION: heavy duty truck Education officer, community, and Software engineer  PLOF: Independent  PATIENT GOALS: get my back so it doesn't hurt    OBJECTIVE:   DIAGNOSTIC FINDINGS:  IMPRESSION: 1. Status post vertebrobasilar augmentation for painful compression fracture at L1 using the balloon kyphoplasty technique.   If the patient has known osteoporosis, recommend treatment as clinically indicated. If the patient's bone density status is unknown, DEXA scan is recommended  SCREENING FOR RED FLAGS: Bowel or bladder incontinence: No Spinal tumors: No Cauda equina syndrome: No Compression fracture: No Abdominal aneurysm: No  COGNITION: Overall cognitive status: Within functional limits for tasks  assessed     SENSATION: WFL  MUSCLE LENGTH: Hamstrings: mod tightness BLE  POSTURE: rounded shoulders  LUMBAR ROM:   AROM eval  Flexion WFL  Extension Very limited  Right lateral flexion Mild limitations  Left lateral flexion Mild limitations  Right rotation 50%  Left rotation 50%   (Blank rows = not tested)  LOWER EXTREMITY ROM:  WFL   LOWER EXTREMITY  MMT:    MMT Right eval Left eval  Hip flexion 4 4+  Hip extension    Hip abduction 4 4  Hip adduction 5 5  Hip internal rotation    Hip external rotation    Knee flexion 5 5  Knee extension 5 5  Ankle dorsiflexion    Ankle plantarflexion    Ankle inversion    Ankle eversion     (Blank rows = not tested)  LUMBAR SPECIAL TESTS:  Straight leg raise test: Positive and FABER test: Negative  FUNCTIONAL TESTS:  5 times sit to stand: 20.39s  GAIT: Distance walked: in clinic distances Assistive device utilized: Single point cane and None Level of assistance: Complete Independence Comments: forward trunk lean, antalgic gait, decreased step length and foot clearance   TODAY'S TREATMENT:                                                                                                                              DATE:  03/19/23 NuStep L5 x 6 minutes Supine stretching with a strap HS, abd, add, 2 x 30 sec each, BLE. Quadruped, alternate arm reach with 4#, alternate leg ext, 5 each, bird dog, no weights 2 x 5 each side. B side to side step with G tband at knees, 2 x 10 reps. Palof press 10#, 2 x 10 reps each direction. Lat pulls, 10 reps 25#, 10 reps 35# Seated trunk extension against Black Tband resistance, 2 x 10 reps Seated alternately marching knees 2 x 10 reps each.  03/12/23 Pball flexion and rotations x10 each way  AR press 15# 2x10  Seated lats/rows 35# 2x10 blackTB crunches 2x10  Calf stretch 30s  Calf raises 2x12 Modified sit ups from wedge 2x10 HS stretch 30s x2 each side   03/10/23 Bike L4  x74mins Standing shoulder ext 10# x12, 15# x12 Cable rows 15# x12, 20# x12  Horizontal abd green 2x12 Deadbugs 2x10 Ab ball roll ups 2x10 Stretching HS, SK2C, double K2C, piriformis 30s each  Trunk rotations x10 Blacktb ext 2x10 Leg press 40# 2x10  03/03/23: Treadmill level 2 min, then 3.25 incline 4 min, level for final min, varying speed, 2.8 to 3.2 mph Leg press B 30lb 2x15, unilateral 20#, 2 x 15 each Seated rows 35lb and lats 25lb 2x15 Shoulder Ext 10lb 2x10 cables  S2S holding red ball 1x 15, yellow 1 x 15 Calf raises on bar: 15x 2   02/26/23 NuStep L5 x 6 min Leg press 20lb 2x12 Seated rows 20lb and lats 15lb 2x10 Shoulder Ext 5lb 2x10 S2S holding red ball 2x10 Supine bridges 2x10 LE on pball small bridges, K2C, Oblq  02/24/23: Nustep L5 6 min Quadriped alt leg extension, alt arm ext 115 x 1 each Biceps curls 7# 15 reps each arm 3 way hip 2# 7 reps Paloff press with blue t band  2 x 10 B Step ups on 2nd step with light UE support B Push  ups from rail on steps, cues to engage deep core musculature 15x  Standing post leans on wall for dead bugs emphasis on high marches to engage lower abs Standing facing wall for ladder climbs   02/20/23: Nustep L5 6 min Supine stabilization exercises- Isometric hip abd/add with ball, belt, alternating, 5 sec holds, 10 reps Supine abdominal stabilization- mini marches with PPT, emphasizing form. 2 x 10 reps Prone with pillow under abdomen B TKE's, 5 sec holds,10 reps, alternating hamstring curls, 10 reps, alt hip ext 10 reps, 2-3 sec holds Side lying for clam shells 10 reps each Paloff press with 5#, 2 x 10 B. Step ups on 6" step 15 x each with B UE support  02/18/23 NuStep L5 x 6 min Sciatic assessment on R, no TTP, tightness, or TP noted in R glut or piriformis, Prox hs tendon neg for pain or tightness as well Supine stabilization exercises- Isometric hip abd/add Supine abdominal stabilization- mini marches with PPT, emphasizing  form. 2 x 10 reps Quadruped stabilization, min TC to flatten back and activate trunk. Initiated alternating reaches with arms. Attempted reaching with legs, but he was too unstable through trunk, so deferred. Paloff press with 5#, 2 x 10 B. Step ups on 6" step with light UE support. Able to perform 6 on each leg before he started to compensate, so therapist stopped him.  EVAL- 02/14/23   PATIENT EDUCATION:  Education details: POC and HEP Person educated: Patient Education method: Explanation Education comprehension: verbalized understanding  HOME EXERCISE PROGRAM: Access Code: [email or web address] URL: [email or web address] Date: 02/14/2023 Prepared by: Andris Baumann  Exercises - Supine Bridge  - 1 x daily - 7 x weekly - 2 sets - 10 reps - Supine Lower Trunk Rotation  - 1 x daily - 7 x weekly - 2 sets - 10 reps - Supine Piriformis Stretch with Foot on Ground  - 1 x daily - 7 x weekly - 2 reps - 30 hold - Seated Hamstring Stretch  - 1 x daily - 7 x weekly - 2 reps - 30 hold - Supine Single Knee to Chest Stretch  - 1 x daily - 7 x weekly - 2 reps - 30 hold  ASSESSMENT:  CLINICAL IMPRESSION: Patient reports continued progress, decreased pain, improved strength. Continued to address trunk stability with multiple exercises. No problems with pain, good form throughout.   REHAB POTENTIAL: Good  CLINICAL DECISION MAKING: Stable/uncomplicated  EVALUATION COMPLEXITY: Low   GOALS: Goals reviewed with patient? Yes  SHORT TERM GOALS: Target date: 03/21/23  Patient will be independent with initial HEP.  Goal status: 02/18/23 met   LONG TERM GOALS: Target date: 04/18/23  Patient will be independent with advanced/ongoing HEP to improve outcomes and carryover.  Goal status: progressing  2.  Patient will report 75% improvement in low back pain to improve QOL.  Baseline: 5/10 Goal status: 03/19/23- Describes discomfort, rather than pain, tightness. Met  3.  Patient will demonstrate  full pain free lumbar ROM to perform ADLs.   Goal status: progressing  4.  Patient will demonstrate improved functional strength as demonstrated by <14s on STS. Baseline: 20.39s Goal status: progressing   PLAN:  PT FREQUENCY: 1-2x/week  PT DURATION: 8 weeks  PLANNED INTERVENTIONS: Therapeutic exercises, Therapeutic activity, Neuromuscular re-education, Balance training, Gait training, Patient/Family education, Self Care, Joint mobilization, Stair training, Dry Needling, Spinal manipulation, Spinal mobilization, Cryotherapy, Moist heat, Traction, Ionotophoresis 4mg /ml Dexamethasone, and Manual therapy.  PLAN FOR NEXT SESSION: progress strengthening core, Ue's, LE's, AR  press, lats/rows, STS with press    Ethel Rana PT, DPT 03/19/23 12:26 PM

## 2023-03-21 ENCOUNTER — Other Ambulatory Visit: Payer: Self-pay | Admitting: Cardiovascular Disease

## 2023-03-21 DIAGNOSIS — I70213 Atherosclerosis of native arteries of extremities with intermittent claudication, bilateral legs: Secondary | ICD-10-CM

## 2023-03-21 DIAGNOSIS — I739 Peripheral vascular disease, unspecified: Secondary | ICD-10-CM

## 2023-03-21 DIAGNOSIS — E7800 Pure hypercholesterolemia, unspecified: Secondary | ICD-10-CM

## 2023-03-24 ENCOUNTER — Ambulatory Visit: Payer: Medicare HMO

## 2023-03-26 NOTE — Therapy (Signed)
OUTPATIENT PHYSICAL THERAPY THORACOLUMBAR TREATMENT  Progress Note Reporting Period 02/14/23 to 03/27/23  See note below for Objective Data and Assessment of Progress/Goals.     Patient Name: Shawn Daniels MRN: [id number] DOB:October 18, 1956, 67 y.o., male Today's Date: 02/13/2023  END OF SESSION:   PT End of Session - 03/27/23 1459     Visit Number 10    Date for PT Re-Evaluation 04/18/23    PT Start Time 1500    PT Stop Time 1545    PT Time Calculation (min) 45 min    Activity Tolerance Patient tolerated treatment well;Patient limited by fatigue    Behavior During Therapy [facility] for tasks assessed/performed                   Past Medical History:  Diagnosis Date   Diabetes mellitus without complication ([place])    GERD (gastroesophageal reflux disease)    Hypertension    Peripheral vascular disease ([place])    Past Surgical History:  Procedure Laterality Date   ABDOMINAL AORTOGRAM W/LOWER EXTREMITY Right 08/20/2022   Procedure: ABDOMINAL AORTOGRAM W/LOWER EXTREMITY;  Surgeon: Serafina Mitchell, MD;  Location: [place] [facility];  Service: Cardiovascular;  Laterality: Right;   ABDOMINAL AORTOGRAM W/LOWER EXTREMITY N/A 11/19/2022   Procedure: ABDOMINAL AORTOGRAM W/LOWER EXTREMITY;  Surgeon: Serafina Mitchell, MD;  Location: [place] [facility];  Service: Cardiovascular;  Laterality: N/A;   ENDARTERECTOMY FEMORAL Right 04/26/2022   Procedure: RIGHT FEMORAL ENDARTERECTOMY;  Surgeon: Serafina Mitchell, MD;  Location: [place];  Service: Vascular;  Laterality: Right;   INTRAVASCULAR LITHOTRIPSY Left 11/19/2022   Procedure: INTRAVASCULAR LITHOTRIPSY;  Surgeon: Serafina Mitchell, MD;  Location: [place] [facility];  Service: Cardiovascular;  Laterality: Left;   IR KYPHO LUMBAR INC FX REDUCE BONE BX UNI/BIL CANNULATION INC/IMAGING  01/13/2023   IR RADIOLOGIST EVAL & MGMT  01/29/2023   KNEE SURGERY     LOWER EXTREMITY ANGIOGRAPHY N/A 01/01/2022   Procedure: LOWER EXTREMITY ANGIOGRAPHY;  Surgeon:  Nigel Mormon, MD;  Location: [place] [facility];  Service: Cardiovascular;  Laterality: N/A;   PATCH ANGIOPLASTY Right 04/26/2022   Procedure: PATCH ANGIOPLASTY OF RIGHT FEMORAL ARTERY USING XENOSURE BOVINE [place];  Surgeon: Serafina Mitchell, MD;  Location: [facility];  Service: Vascular;  Laterality: Right;   PENILE PROSTHESIS IMPLANT     PERIPHERAL VASCULAR ATHERECTOMY  08/20/2022   Procedure: PERIPHERAL VASCULAR ATHERECTOMY;  Surgeon: Serafina Mitchell, MD;  Location: [place] [facility];  Service: Cardiovascular;;  popliteal   PERIPHERAL VASCULAR BALLOON ANGIOPLASTY  01/08/2022   Procedure: PERIPHERAL VASCULAR BALLOON ANGIOPLASTY;  Surgeon: Adrian Prows, MD;  Location: [place] [facility];  Service: Cardiovascular;;   PERIPHERAL VASCULAR INTERVENTION Left 11/19/2022   Procedure: PERIPHERAL VASCULAR INTERVENTION;  Surgeon: Serafina Mitchell, MD;  Location: [place] [facility];  Service: Cardiovascular;  Laterality: Left;   WRIST SURGERY     Patient Active Problem List   Diagnosis Date Noted   Shock circulatory ([place]) 02/06/2023   Lumbar compression fracture, closed, initial encounter ([place]) 01/05/2023   Alcohol dependence ([place]) 01/05/2023   Sinus tachycardia 10/08/2022   PAD (peripheral artery disease) ([place]) 04/26/2022   Hypertension 04/25/2022   Insulin dependent diabetes mellitus type IA ([place]) 04/25/2022   HLD (hyperlipidemia) 04/03/2022   Claudication in peripheral vascular disease ([place]) 12/31/2021   Extensor tendon disruption 06/19/2020   Traumatic tear of supraspinatus tendon, right, initial encounter 12/02/2018   Rib contusion, right, initial encounter 12/02/2018   Trigger  finger, acquired 06/29/2018   Mass of finger of left hand 11/01/2017   Closed fracture of first metacarpal bone of left hand with routine healing, subsequent encounter 09/21/2017    PCP: Jerene Bears  REFERRING PROVIDER: Tamela Gammon  REFERRING DIAG:  S/P L1 Fracture surgery      Rationale for  Evaluation and Treatment: Rehabilitation  THERAPY DIAG:  No diagnosis found.  ONSET DATE: 01/05/23  SUBJECTIVE:                                                                                                                                                                                           SUBJECTIVE STATEMENT: The back is doing great, but I still get tight and feel like I always need to stretch.   PERTINENT HISTORY:  Shawn Daniels is a 67 y.o. male with medical history significant of DM, HTN, and PVD presenting with a fall.  He reports that his blood sugar was low.  He got up to get some glucose tabs and fell onto the fire place hearth.  He had to crawl to the bathroom.  He doesn't think his glucose is low often.  He does not normally have back pain.  He is opposed to the lidoderm, doesn't think it will help.  Brace isn't helping much, not wearing TLSO.  No radicular symptoms   Kyphoplasty 01/13/22  PAIN:  Are you having pain? Yes: NPRS scale: 0/10 Pain location: low back Pain description: soreness, tightness  Aggravating factors: overextending myself Relieving factors: Tylenol, rest  PRECAUTIONS: None  WEIGHT BEARING RESTRICTIONS: No  FALLS:  Has patient fallen in last 6 months? Yes. Number of falls 1  LIVING ENVIRONMENT: Lives with: lives with their family Lives in: House/apartment Stairs: No Has following equipment at home: None  OCCUPATION: heavy duty truck Education officer, community, and Software engineer  PLOF: Independent  PATIENT GOALS: get my back so it doesn't hurt    OBJECTIVE:   DIAGNOSTIC FINDINGS:  IMPRESSION: 1. Status post vertebrobasilar augmentation for painful compression fracture at L1 using the balloon kyphoplasty technique.   If the patient has known osteoporosis, recommend treatment as clinically indicated. If the patient's bone density status is unknown, DEXA scan is recommended  SCREENING FOR RED FLAGS: Bowel or bladder incontinence:  No Spinal tumors: No Cauda equina syndrome: No Compression fracture: No Abdominal aneurysm: No  COGNITION: Overall cognitive status: Within functional limits for tasks assessed     SENSATION: [facility]  MUSCLE LENGTH: Hamstrings: mod tightness BLE  POSTURE: rounded shoulders  LUMBAR ROM:   AROM eval 03/27/23  Flexion [facility] WFL  Extension Very limited 75%  Right lateral  flexion Mild limitations WFL  Left lateral flexion Mild limitations WFL  Right rotation 50% WFL  Left rotation 50% WFL   (Blank rows = not tested)  LOWER EXTREMITY ROM:  WFL   LOWER EXTREMITY MMT:    MMT Right eval Left eval  Hip flexion 4 4+  Hip extension    Hip abduction 4 4  Hip adduction 5 5  Hip internal rotation    Hip external rotation    Knee flexion 5 5  Knee extension 5 5  Ankle dorsiflexion    Ankle plantarflexion    Ankle inversion    Ankle eversion     (Blank rows = not tested)  LUMBAR SPECIAL TESTS:  Straight leg raise test: Positive and FABER test: Negative  FUNCTIONAL TESTS:  5 times sit to stand: 20.39s  GAIT: Distance walked: in clinic distances Assistive device utilized: Single point cane and None Level of assistance: Complete Independence Comments: forward trunk lean, antalgic gait, decreased step length and foot clearance   TODAY'S TREATMENT:                                                                                                                              DATE:  03/27/23 NuStep L5 x37mins  Recheck ROM and 5xSTS Pec stretch 30s  Holding pull up handles and stretching 30s  Stretching HS, IT band, knees to chest, piriformis 30s each Shoulder ext 10# 2x10 Cable rows 20# 2x10  Leg press 50# 2x10 STS with chest press blue ball 2x10   03/19/23 NuStep L5 x 6 minutes Supine stretching with a strap HS, abd, add, 2 x 30 sec each, BLE. Quadruped, alternate arm reach with 4#, alternate leg ext, 5 each, bird dog, no weights 2 x 5 each side. B side to side step with G  tband at knees, 2 x 10 reps. Palof press 10#, 2 x 10 reps each direction. Lat pulls, 10 reps 25#, 10 reps 35# Seated trunk extension against Black Tband resistance, 2 x 10 reps Seated alternately marching knees 2 x 10 reps each.  03/12/23 Pball flexion and rotations x10 each way  AR press 15# 2x10  Seated lats/rows 35# 2x10 blackTB crunches 2x10  Calf stretch 30s  Calf raises 2x12 Modified sit ups from wedge 2x10 HS stretch 30s x2 each side   03/10/23 Bike L4 x38mins Standing shoulder ext 10# x12, 15# x12 Cable rows 15# x12, 20# x12  Horizontal abd green 2x12 Deadbugs 2x10 Ab ball roll ups 2x10 Stretching HS, SK2C, double K2C, piriformis 30s each  Trunk rotations x10 Blacktb ext 2x10 Leg press 40# 2x10  03/03/23: Treadmill level 2 min, then 3.25 incline 4 min, level for final min, varying speed, 2.8 to 3.2 mph Leg press B 30lb 2x15, unilateral 20#, 2 x 15 each Seated rows 35lb and lats 25lb 2x15 Shoulder Ext 10lb 2x10 cables  S2S holding red ball 1x 15, yellow 1 x 15 Calf raises on bar: 15x  2   02/26/23 NuStep L5 x 6 min Leg press 20lb 2x12 Seated rows 20lb and lats 15lb 2x10 Shoulder Ext 5lb 2x10 S2S holding red ball 2x10 Supine bridges 2x10 LE on pball small bridges, K2C, Oblq  02/24/23: Nustep L5 6 min Quadriped alt leg extension, alt arm ext 115 x 1 each Biceps curls 7# 15 reps each arm 3 way hip 2# 7 reps Paloff press with blue t band  2 x 10 B Step ups on 2nd step with light UE support B Push ups from rail on steps, cues to engage deep core musculature 15x  Standing post leans on wall for dead bugs emphasis on high marches to engage lower abs Standing facing wall for ladder climbs   02/20/23: Nustep L5 6 min Supine stabilization exercises- Isometric hip abd/add with ball, belt, alternating, 5 sec holds, 10 reps Supine abdominal stabilization- mini marches with PPT, emphasizing form. 2 x 10 reps Prone with pillow under abdomen B TKE's, 5 sec holds,10 reps,  alternating hamstring curls, 10 reps, alt hip ext 10 reps, 2-3 sec holds Side lying for clam shells 10 reps each Paloff press with 5#, 2 x 10 B. Step ups on 6" step 15 x each with B UE support  02/18/23 NuStep L5 x 6 min Sciatic assessment on R, no TTP, tightness, or TP noted in R glut or piriformis, Prox hs tendon neg for pain or tightness as well Supine stabilization exercises- Isometric hip abd/add Supine abdominal stabilization- mini marches with PPT, emphasizing form. 2 x 10 reps Quadruped stabilization, min TC to flatten back and activate trunk. Initiated alternating reaches with arms. Attempted reaching with legs, but he was too unstable through trunk, so deferred. Paloff press with 5#, 2 x 10 B. Step ups on 6" step with light UE support. Able to perform 6 on each leg before he started to compensate, so therapist stopped him.  EVAL- 02/14/23   PATIENT EDUCATION:  Education details: POC and HEP Person educated: Patient Education method: Explanation Education comprehension: verbalized understanding  HOME EXERCISE PROGRAM: Access Code: [email or web address] URL: [email or web address] Date: 02/14/2023 Prepared by: Andris Baumann  Exercises - Supine Bridge  - 1 x daily - 7 x weekly - 2 sets - 10 reps - Supine Lower Trunk Rotation  - 1 x daily - 7 x weekly - 2 sets - 10 reps - Supine Piriformis Stretch with Foot on Ground  - 1 x daily - 7 x weekly - 2 reps - 30 hold - Seated Hamstring Stretch  - 1 x daily - 7 x weekly - 2 reps - 30 hold - Supine Single Knee to Chest Stretch  - 1 x daily - 7 x weekly - 2 reps - 30 hold  ASSESSMENT:  CLINICAL IMPRESSION:  Patient reports continued progress, decreased pain, improved strength. Is progressing well towards meeting goals. He will return to work on Monday and has another PT session at [facility], we will see how he tolerates it. Was advised to try doing some seated stretches throughout the day.   REHAB POTENTIAL: Good  CLINICAL DECISION  MAKING: Stable/uncomplicated  EVALUATION COMPLEXITY: Low   GOALS: Goals reviewed with patient? Yes  SHORT TERM GOALS: Target date: 03/21/23  Patient will be independent with initial HEP.  Goal status: 02/18/23 met   LONG TERM GOALS: Target date: 04/18/23  Patient will be independent with advanced/ongoing HEP to improve outcomes and carryover.  Goal status: progressing  2.  Patient will report 75% improvement in low back pain  to improve QOL.  Baseline: 5/10 Goal status: 03/19/23- Describes discomfort, rather than pain, tightness. Met  3.  Patient will demonstrate full pain free lumbar ROM to perform ADLs.   Goal status: progressing  4.  Patient will demonstrate improved functional strength as demonstrated by <14s on 5xSTS. Baseline: 20.39s, 13.6s Goal status: MET   PLAN:  PT FREQUENCY: 1-2x/week  PT DURATION: 8 weeks  PLANNED INTERVENTIONS: Therapeutic exercises, Therapeutic activity, Neuromuscular re-education, Balance training, Gait training, Patient/Family education, Self Care, Joint mobilization, Stair training, Dry Needling, Spinal manipulation, Spinal mobilization, Cryotherapy, Moist heat, Traction, Ionotophoresis 4mg /ml Dexamethasone, and Manual therapy.  PLAN FOR NEXT SESSION: progress strengthening and continue with stretching   Andris Baumann, PT, DPT 03/27/23 3:40 PM

## 2023-03-27 ENCOUNTER — Ambulatory Visit: Payer: Medicare HMO | Attending: Student

## 2023-03-27 DIAGNOSIS — Z9889 Other specified postprocedural states: Secondary | ICD-10-CM | POA: Diagnosis not present

## 2023-03-27 DIAGNOSIS — M5459 Other low back pain: Secondary | ICD-10-CM | POA: Insufficient documentation

## 2023-03-27 DIAGNOSIS — M6281 Muscle weakness (generalized): Secondary | ICD-10-CM | POA: Diagnosis not present

## 2023-03-27 DIAGNOSIS — M62830 Muscle spasm of back: Secondary | ICD-10-CM

## 2023-03-31 ENCOUNTER — Ambulatory Visit: Payer: Medicare HMO

## 2023-04-03 ENCOUNTER — Ambulatory Visit: Payer: Medicare HMO

## 2023-04-03 DIAGNOSIS — M5459 Other low back pain: Secondary | ICD-10-CM | POA: Diagnosis not present

## 2023-04-03 DIAGNOSIS — M62830 Muscle spasm of back: Secondary | ICD-10-CM

## 2023-04-03 DIAGNOSIS — Z9889 Other specified postprocedural states: Secondary | ICD-10-CM

## 2023-04-03 DIAGNOSIS — M6281 Muscle weakness (generalized): Secondary | ICD-10-CM | POA: Diagnosis not present

## 2023-04-03 NOTE — Therapy (Signed)
OUTPATIENT PHYSICAL THERAPY THORACOLUMBAR TREATMENT   Patient Name: Shawn Daniels MRN: [id number] DOB:12-28-1955, 67 y.o., male Today's Date: 02/13/2023  END OF SESSION:   PT End of Session - 04/03/23 1555     Visit Number 11    Date for PT Re-Evaluation 04/18/23    PT Start Time 1555    PT Stop Time 1630    PT Time Calculation (min) 35 min    Activity Tolerance Patient tolerated treatment well;Patient limited by fatigue    Behavior During Therapy [facility] for tasks assessed/performed                    Past Medical History:  Diagnosis Date   Diabetes mellitus without complication (HCC)    GERD (gastroesophageal reflux disease)    Hypertension    Peripheral vascular disease (HCC)    Past Surgical History:  Procedure Laterality Date   ABDOMINAL AORTOGRAM W/LOWER EXTREMITY Right 08/20/2022   Procedure: ABDOMINAL AORTOGRAM W/LOWER EXTREMITY;  Surgeon: Nada Libman, MD;  Location: [facility];  Service: Cardiovascular;  Laterality: Right;   ABDOMINAL AORTOGRAM W/LOWER EXTREMITY N/A 11/19/2022   Procedure: ABDOMINAL AORTOGRAM W/LOWER EXTREMITY;  Surgeon: Nada Libman, MD;  Location: [facility];  Service: Cardiovascular;  Laterality: N/A;   ENDARTERECTOMY FEMORAL Right 04/26/2022   Procedure: RIGHT FEMORAL ENDARTERECTOMY;  Surgeon: Nada Libman, MD;  Location: [facility] OR;  Service: Vascular;  Laterality: Right;   INTRAVASCULAR LITHOTRIPSY Left 11/19/2022   Procedure: INTRAVASCULAR LITHOTRIPSY;  Surgeon: Nada Libman, MD;  Location: [facility];  Service: Cardiovascular;  Laterality: Left;   IR KYPHO LUMBAR INC FX REDUCE BONE BX UNI/BIL CANNULATION INC/IMAGING  01/13/2023   IR RADIOLOGIST EVAL & MGMT  01/29/2023   KNEE SURGERY     LOWER EXTREMITY ANGIOGRAPHY N/A 01/01/2022   Procedure: LOWER EXTREMITY ANGIOGRAPHY;  Surgeon: Elder Negus, MD;  Location: [facility];  Service: Cardiovascular;  Laterality: N/A;   PATCH ANGIOPLASTY  Right 04/26/2022   Procedure: PATCH ANGIOPLASTY OF RIGHT FEMORAL ARTERY USING XENOSURE BOVINE PERCARDIUM PATCH;  Surgeon: Nada Libman, MD;  Location: [facility] OR;  Service: Vascular;  Laterality: Right;   PENILE PROSTHESIS IMPLANT     PERIPHERAL VASCULAR ATHERECTOMY  08/20/2022   Procedure: PERIPHERAL VASCULAR ATHERECTOMY;  Surgeon: Nada Libman, MD;  Location: [facility];  Service: Cardiovascular;;  popliteal   PERIPHERAL VASCULAR BALLOON ANGIOPLASTY  01/08/2022   Procedure: PERIPHERAL VASCULAR BALLOON ANGIOPLASTY;  Surgeon: Yates Decamp, MD;  Location: [facility];  Service: Cardiovascular;;   PERIPHERAL VASCULAR INTERVENTION Left 11/19/2022   Procedure: PERIPHERAL VASCULAR INTERVENTION;  Surgeon: Nada Libman, MD;  Location: [facility];  Service: Cardiovascular;  Laterality: Left;   WRIST SURGERY     Patient Active Problem List   Diagnosis Date Noted   Shock circulatory (HCC) 02/06/2023   Lumbar compression fracture, closed, initial encounter (HCC) 01/05/2023   Alcohol dependence (HCC) 01/05/2023   Sinus tachycardia 10/08/2022   PAD (peripheral artery disease) (HCC) 04/26/2022   Hypertension 04/25/2022   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) 04/25/2022   HLD (hyperlipidemia) 04/03/2022   Claudication in peripheral vascular disease (HCC) 12/31/2021   Extensor tendon disruption 06/19/2020   Traumatic tear of supraspinatus tendon, right, initial encounter 12/02/2018   Rib contusion, right, initial encounter 12/02/2018   Trigger finger, acquired 06/29/2018   Mass of finger of left hand 11/01/2017   Closed fracture of first metacarpal bone  of left hand with routine healing, subsequent encounter 09/21/2017    PCP: Doreen Beam  REFERRING PROVIDER: Sandrea Matte  REFERRING DIAG:  S/P L1 Fracture surgery      Rationale for Evaluation and Treatment: Rehabilitation  THERAPY DIAG:  No diagnosis found.  ONSET DATE: 01/05/23  SUBJECTIVE:                                                                                                                                                                                            SUBJECTIVE STATEMENT: Monday I went back to work and I was way too tired to come. I am getting stronger every day, just tired at end of day. My back is still tight.   PERTINENT HISTORY:  Shawn Daniels is a 67 y.o. male with medical history significant of DM, HTN, and PVD presenting with a fall.  He reports that his blood sugar was low.  He got up to get some glucose tabs and fell onto the fire place hearth.  He had to crawl to the bathroom.  He doesn't think his glucose is low often.  He does not normally have back pain.  He is opposed to the lidoderm, doesn't think it will help.  Brace isn't helping much, not wearing TLSO.  No radicular symptoms   Kyphoplasty 01/13/22  PAIN:  Are you having pain? Yes: NPRS scale: 0/10 Pain location: low back Pain description: soreness, tightness  Aggravating factors: overextending myself Relieving factors: Tylenol, rest  PRECAUTIONS: None  WEIGHT BEARING RESTRICTIONS: No  FALLS:  Has patient fallen in last 6 months? Yes. 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 Status post vertebrobasilar augmentation for painful compression fracture at L1 using the balloon kyphoplasty technique.   If the patient has known osteoporosis, recommend treatment as clinically indicated. If the patient's bone density status is unknown, DEXA scan is recommended  SCREENING FOR RED FLAGS: Bowel or bladder incontinence: No Spinal tumors: No Cauda equina syndrome: No Compression fracture:  No Abdominal aneurysm: No  COGNITION: Overall cognitive status: Within functional limits for tasks assessed     SENSATION: [facility]  MUSCLE LENGTH: Hamstrings: mod tightness BLE  POSTURE: rounded shoulders  LUMBAR ROM:   AROM eval 03/27/23  Flexion [facility] WFL  Extension Very limited 75%  Right lateral flexion Mild limitations WFL  Left lateral  flexion Mild limitations WFL  Right rotation 50% WFL  Left rotation 50% WFL   (Blank rows = not tested)  LOWER EXTREMITY ROM:  WFL   LOWER EXTREMITY MMT:    MMT Right eval Left eval  Hip flexion 4 4+  Hip extension    Hip abduction 4 4  Hip adduction 5 5  Hip internal rotation    Hip external rotation    Knee flexion 5 5  Knee extension 5 5  Ankle dorsiflexion    Ankle plantarflexion    Ankle inversion    Ankle eversion     (Blank rows = not tested)  LUMBAR SPECIAL TESTS:  Straight leg raise test: Positive and FABER test: Negative  FUNCTIONAL TESTS:  5 times sit to stand: 20.39s  GAIT: Distance walked: in clinic distances Assistive device utilized: Single point cane and None Level of assistance: Complete Independence Comments: forward trunk lean, antalgic gait, decreased step length and foot clearance   TODAY'S TREATMENT:                                                                                                                              DATE:  04/03/23 NuStep L6 x16mins Seated Rows 55# 2x10 Lat pull downs 35# 2x10 blackTB ext 2x10 Horizontal abd blue 2x10 Pball stretching forward and rotations Stretching HS, IT band, knees to chest, piriformis 30s each Passive trunk rotations x10   03/27/23 NuStep L5 x52mins  Recheck ROM and 5xSTS Pec stretch 30s  Holding pull up handles and stretching 30s  Stretching HS, IT band, knees to chest, piriformis 30s each Shoulder ext 10# 2x10 Cable rows 20# 2x10  Leg press 50# 2x10 STS with chest press blue ball 2x10   03/19/23 NuStep L5 x 6 minutes Supine stretching with a  strap HS, abd, add, 2 x 30 sec each, BLE. Quadruped, alternate arm reach with 4#, alternate leg ext, 5 each, bird dog, no weights 2 x 5 each side. B side to side step with G tband at knees, 2 x 10 reps. Palof press 10#, 2 x 10 reps each direction. Lat pulls, 10 reps 25#, 10 reps 35# Seated trunk extension against Black Tband resistance, 2 x 10 reps Seated alternately marching knees 2 x 10 reps each.  03/12/23 Pball flexion and rotations x10 each way  AR press 15# 2x10  Seated lats/rows 35# 2x10 blackTB crunches 2x10  Calf stretch 30s  Calf raises 2x12 Modified sit ups from wedge 2x10 HS stretch 30s x2 each side   03/10/23 Bike L4 x76mins Standing shoulder ext 10# x12, 15# x12 Cable rows 15# x12, 20# x12  Horizontal abd green 2x12 Deadbugs 2x10 Ab ball roll ups 2x10 Stretching HS, SK2C, double K2C, piriformis 30s each  Trunk rotations x10 Blacktb ext 2x10 Leg press 40# 2x10  03/03/23: Treadmill level 2 min, then 3.25 incline 4 min, level for final min, varying speed, 2.8 to 3.2 mph Leg press B 30lb 2x15,  unilateral 20#, 2 x 15 each Seated rows 35lb and lats 25lb 2x15 Shoulder Ext 10lb 2x10 cables  S2S holding red ball 1x 15, yellow 1 x 15 Calf raises on bar: 15x 2   02/26/23 NuStep L5 x 6 min Leg press 20lb 2x12 Seated rows 20lb and lats 15lb 2x10 Shoulder Ext 5lb 2x10 S2S holding red ball 2x10 Supine bridges 2x10 LE on pball small bridges, K2C, Oblq  02/24/23: Nustep L5 6 min Quadriped alt leg extension, alt arm ext 115 x 1 each Biceps curls 7# 15 reps each arm 3 way hip 2# 7 reps Paloff press with blue t band  2 x 10 B Step ups on 2nd step with light UE support B Push ups from rail on steps, cues to engage deep core musculature 15x  Standing post leans on wall for dead bugs emphasis on high marches to engage lower abs Standing facing wall for ladder climbs   02/20/23: Nustep L5 6 min Supine stabilization exercises- Isometric hip abd/add with ball, belt,  alternating, 5 sec holds, 10 reps Supine abdominal stabilization- mini marches with PPT, emphasizing form. 2 x 10 reps Prone with pillow under abdomen B TKE's, 5 sec holds,10 reps, alternating hamstring curls, 10 reps, alt hip ext 10 reps, 2-3 sec holds Side lying for clam shells 10 reps each Paloff press with 5#, 2 x 10 B. Step ups on 6" step 15 x each with B UE support  02/18/23 NuStep L5 x 6 min Sciatic assessment on R, no TTP, tightness, or TP noted in R glut or piriformis, Prox hs tendon neg for pain or tightness as well Supine stabilization exercises- Isometric hip abd/add Supine abdominal stabilization- mini marches with PPT, emphasizing form. 2 x 10 reps Quadruped stabilization, min TC to flatten back and activate trunk. Initiated alternating reaches with arms. Attempted reaching with legs, but he was too unstable through trunk, so deferred. Paloff press with 5#, 2 x 10 B. Step ups on 6" step with light UE support. Able to perform 6 on each leg before he started to compensate, so therapist stopped him.  EVAL- 02/14/23   PATIENT EDUCATION:  Education details: POC and HEP Person educated: Patient Education method: Explanation Education comprehension: verbalized understanding  HOME EXERCISE PROGRAM: Access Code: [email or web address] URL: [email or web address] Date: 02/14/2023 Prepared by: Cassie FreerMona Mone Commisso  Exercises - Supine Bridge  - 1 x daily - 7 x weekly - 2 sets - 10 reps - Supine Lower Trunk Rotation  - 1 x daily - 7 x weekly - 2 sets - 10 reps - Supine Piriformis Stretch with Foot on Ground  - 1 x daily - 7 x weekly - 2 reps - 30 hold - Seated Hamstring Stretch  - 1 x daily - 7 x weekly - 2 reps - 30 hold - Supine Single Knee to Chest Stretch  - 1 x daily - 7 x weekly - 2 reps - 30 hold  ASSESSMENT:  CLINICAL IMPRESSION:  Patient enters ~10 mins late, reports continued progress, decreased pain, and improved strength. Returned to work and is able to get through work day  without pain but still has lot of tightness.   REHAB POTENTIAL: Good  CLINICAL DECISION MAKING: Stable/uncomplicated  EVALUATION COMPLEXITY: Low   GOALS: Goals reviewed with patient? Yes  SHORT TERM GOALS: Target date: 03/21/23  Patient will be independent with initial HEP.  Goal status: 02/18/23 met   LONG TERM GOALS: Target date: 04/18/23  Patient will be independent with advanced/ongoing HEP to  improve outcomes and carryover.  Goal status: progressing  2.  Patient will report 75% improvement in low back pain to improve QOL.  Baseline: 5/10 Goal status: 03/19/23- Describes discomfort, rather than pain, tightness. Met  3.  Patient will demonstrate full pain free lumbar ROM to perform ADLs.   Goal status: progressing  4.  Patient will demonstrate improved functional strength as demonstrated by <14s on 5xSTS. Baseline: 20.39s, 13.6s Goal status: MET   PLAN:  PT FREQUENCY: 1-2x/week  PT DURATION: 8 weeks  PLANNED INTERVENTIONS: Therapeutic exercises, Therapeutic activity, Neuromuscular re-education, Balance training, Gait training, Patient/Family education, Self Care, Joint mobilization, Stair training, Dry Needling, Spinal manipulation, Spinal mobilization, Cryotherapy, Moist heat, Traction, Ionotophoresis 4mg /ml Dexamethasone, and Manual therapy.  PLAN FOR NEXT SESSION: progress strengthening and continue with stretching   Cassie Freer, PT, DPT 04/03/23 4:31 PM

## 2023-04-14 ENCOUNTER — Ambulatory Visit: Payer: Medicare HMO | Admitting: Physical Therapy

## 2023-04-14 ENCOUNTER — Encounter: Payer: Self-pay | Admitting: Physical Therapy

## 2023-04-14 DIAGNOSIS — M6281 Muscle weakness (generalized): Secondary | ICD-10-CM | POA: Diagnosis not present

## 2023-04-14 DIAGNOSIS — M62830 Muscle spasm of back: Secondary | ICD-10-CM | POA: Diagnosis not present

## 2023-04-14 DIAGNOSIS — Z9889 Other specified postprocedural states: Secondary | ICD-10-CM

## 2023-04-14 DIAGNOSIS — M5459 Other low back pain: Secondary | ICD-10-CM | POA: Diagnosis not present

## 2023-04-14 NOTE — Therapy (Signed)
OUTPATIENT PHYSICAL THERAPY THORACOLUMBAR TREATMENT   Patient Name: Shawn Daniels MRN: [id number] DOB:April 16, 1956, 67 y.o., male Today's Date: 02/13/2023  END OF SESSION:   PT End of Session - 04/14/23 1528     Visit Number 12    Date for PT Re-Evaluation 04/18/23    PT Start Time 1528    PT Stop Time 1600    PT Time Calculation (min) 32 min    Activity Tolerance Patient tolerated treatment well;Patient limited by fatigue    Behavior During Therapy [facility] for tasks assessed/performed                    Past Medical History:  Diagnosis Date   Diabetes mellitus without complication (HCC)    GERD (gastroesophageal reflux disease)    Hypertension    Peripheral vascular disease (HCC)    Past Surgical History:  Procedure Laterality Date   ABDOMINAL AORTOGRAM W/LOWER EXTREMITY Right 08/20/2022   Procedure: ABDOMINAL AORTOGRAM W/LOWER EXTREMITY;  Surgeon: Nada Libman, MD;  Location: [facility];  Service: Cardiovascular;  Laterality: Right;   ABDOMINAL AORTOGRAM W/LOWER EXTREMITY N/A 11/19/2022   Procedure: ABDOMINAL AORTOGRAM W/LOWER EXTREMITY;  Surgeon: Nada Libman, MD;  Location: [facility];  Service: Cardiovascular;  Laterality: N/A;   ENDARTERECTOMY FEMORAL Right 04/26/2022   Procedure: RIGHT FEMORAL ENDARTERECTOMY;  Surgeon: Nada Libman, MD;  Location: [facility] OR;  Service: Vascular;  Laterality: Right;   INTRAVASCULAR LITHOTRIPSY Left 11/19/2022   Procedure: INTRAVASCULAR LITHOTRIPSY;  Surgeon: Nada Libman, MD;  Location: [facility];  Service: Cardiovascular;  Laterality: Left;   IR KYPHO LUMBAR INC FX REDUCE BONE BX UNI/BIL CANNULATION INC/IMAGING  01/13/2023   IR RADIOLOGIST EVAL & MGMT  01/29/2023   KNEE SURGERY     LOWER EXTREMITY ANGIOGRAPHY N/A 01/01/2022   Procedure: LOWER EXTREMITY ANGIOGRAPHY;  Surgeon: Elder Negus, MD;  Location: [facility];  Service: Cardiovascular;  Laterality: N/A;   PATCH ANGIOPLASTY  Right 04/26/2022   Procedure: PATCH ANGIOPLASTY OF RIGHT FEMORAL ARTERY USING XENOSURE BOVINE PERCARDIUM PATCH;  Surgeon: Nada Libman, MD;  Location: [facility] OR;  Service: Vascular;  Laterality: Right;   PENILE PROSTHESIS IMPLANT     PERIPHERAL VASCULAR ATHERECTOMY  08/20/2022   Procedure: PERIPHERAL VASCULAR ATHERECTOMY;  Surgeon: Nada Libman, MD;  Location: [facility];  Service: Cardiovascular;;  popliteal   PERIPHERAL VASCULAR BALLOON ANGIOPLASTY  01/08/2022   Procedure: PERIPHERAL VASCULAR BALLOON ANGIOPLASTY;  Surgeon: Yates Decamp, MD;  Location: [facility];  Service: Cardiovascular;;   PERIPHERAL VASCULAR INTERVENTION Left 11/19/2022   Procedure: PERIPHERAL VASCULAR INTERVENTION;  Surgeon: Nada Libman, MD;  Location: [facility];  Service: Cardiovascular;  Laterality: Left;   WRIST SURGERY     Patient Active Problem List   Diagnosis Date Noted   Shock circulatory (HCC) 02/06/2023   Lumbar compression fracture, closed, initial encounter (HCC) 01/05/2023   Alcohol dependence (HCC) 01/05/2023   Sinus tachycardia 10/08/2022   PAD (peripheral artery disease) (HCC) 04/26/2022   Hypertension 04/25/2022   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) 04/25/2022   HLD (hyperlipidemia) 04/03/2022   Claudication in peripheral vascular disease (HCC) 12/31/2021   Extensor tendon disruption 06/19/2020   Traumatic tear of supraspinatus tendon, right, initial encounter 12/02/2018   Rib contusion, right, initial encounter 12/02/2018   Trigger finger, acquired 06/29/2018   Mass of finger of left hand 11/01/2017   Closed fracture of first metacarpal bone  of left hand with routine healing, subsequent encounter 09/21/2017    PCP: Doreen Beam  REFERRING PROVIDER: Sandrea Matte  REFERRING DIAG:  S/P L1 Fracture surgery      Rationale for Evaluation and Treatment: Rehabilitation  THERAPY DIAG:  No diagnosis found.  ONSET DATE: 01/05/23  SUBJECTIVE:                                                                                                                                                                                            SUBJECTIVE STATEMENT: Doing ok, just weak  PERTINENT HISTORY:  Shawn Daniels is a 67 y.o. male with medical history significant of DM, HTN, and PVD presenting with a fall.  He reports that his blood sugar was low.  He got up to get some glucose tabs and fell onto the fire place hearth.  He had to crawl to the bathroom.  He doesn't think his glucose is low often.  He does not normally have back pain.  He is opposed to the lidoderm, doesn't think it will help.  Brace isn't helping much, not wearing TLSO.  No radicular symptoms   Kyphoplasty 01/13/22  PAIN:  Are you having pain? Yes: NPRS scale: 0/10 Pain location: low back Pain description: soreness, tightness  Aggravating factors: overextending myself Relieving factors: Tylenol, rest  PRECAUTIONS: None  WEIGHT BEARING RESTRICTIONS: No  FALLS:  Has patient fallen in last 6 months? Yes. 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 Status post vertebrobasilar augmentation for painful compression fracture at L1 using the balloon kyphoplasty technique.   If the patient has known osteoporosis, recommend treatment as clinically indicated. If the patient's bone density status is unknown, DEXA scan is recommended  SCREENING FOR RED FLAGS: Bowel or bladder incontinence: No Spinal tumors: No Cauda equina syndrome: No Compression fracture: No Abdominal aneurysm: No  COGNITION: Overall cognitive status: Within functional limits for tasks  assessed     SENSATION: [facility]  MUSCLE LENGTH: Hamstrings: mod tightness BLE  POSTURE: rounded shoulders  LUMBAR ROM:   AROM eval 03/27/23  Flexion [facility] WFL  Extension Very limited 75%  Right lateral flexion Mild limitations WFL  Left lateral flexion Mild limitations WFL  Right rotation 50% WFL  Left rotation 50% WFL   (Blank rows = not tested)  LOWER EXTREMITY ROM:  [facility]  LOWER EXTREMITY MMT:    MMT Right eval Left eval  Hip flexion 4 4+  Hip extension    Hip abduction 4 4  Hip adduction 5 5  Hip internal rotation    Hip external rotation    Knee flexion 5 5  Knee extension 5 5  Ankle dorsiflexion    Ankle plantarflexion    Ankle inversion    Ankle eversion     (Blank rows = not tested)  LUMBAR SPECIAL TESTS:  Straight leg raise test: Positive and FABER test: Negative  FUNCTIONAL TESTS:  5 times sit to stand: 20.39s  GAIT: Distance walked: in clinic distances Assistive device utilized: Single point cane and None Level of assistance: Complete Independence Comments: forward trunk lean, antalgic gait, decreased step length and foot clearance   TODAY'S TREATMENT:                                                                                                                              DATE:  04/14/23 NuStep L 5 x 6 min  Rows & Lats 55lb 2x12 Shoulder Ext 15lb 2x12 AR press 20lb 2x10 S2S OHP yellow ball 2x12  Overhead lumbar Ext yellow ball 2x10    04/03/23 NuStep L6 x1mins Seated Rows 55# 2x10 Lat pull downs 35# 2x10 blackTB ext 2x10 Horizontal abd blue 2x10 Pball stretching forward and rotations Stretching HS, IT band, knees to chest, piriformis 30s each Passive trunk rotations x10   03/27/23 NuStep L5 x26mins  Recheck ROM and 5xSTS Pec stretch 30s  Holding pull up handles and stretching 30s  Stretching HS, IT band, knees to chest, piriformis 30s each Shoulder ext 10# 2x10 Cable rows 20# 2x10  Leg press 50# 2x10 STS with chest press blue ball  2x10   03/19/23 NuStep L5 x 6 minutes Supine stretching with a strap HS, abd, add, 2 x 30 sec each, BLE. Quadruped, alternate arm reach with 4#, alternate leg ext, 5 each, bird dog, no weights 2 x 5 each side. B side to side step with G tband at knees, 2 x 10 reps. Palof press 10#, 2 x 10 reps each direction. Lat pulls, 10 reps 25#, 10 reps 35# Seated trunk extension against Black Tband resistance, 2 x 10 reps Seated alternately marching knees 2 x 10 reps each.  03/12/23 Pball flexion and rotations x10 each way  AR press 15# 2x10  Seated lats/rows 35# 2x10 blackTB crunches 2x10  Calf stretch 30s  Calf raises 2x12 Modified sit ups from wedge 2x10 HS stretch 30s x2 each side   03/10/23 Bike L4 x53mins Standing shoulder ext 10# x12, 15# x12 Cable rows 15# x12, 20# x12  Horizontal abd green 2x12 Deadbugs 2x10 Ab ball roll ups 2x10 Stretching HS, SK2C, double K2C, piriformis 30s each  Trunk rotations x10 Blacktb ext 2x10 Leg press 40# 2x10  03/03/23: Treadmill level 2 min, then 3.25 incline 4 min, level for final min, varying speed, 2.8 to  3.2 mph Leg press B 30lb 2x15, unilateral 20#, 2 x 15 each Seated rows 35lb and lats 25lb 2x15 Shoulder Ext 10lb 2x10 cables  S2S holding red ball 1x 15, yellow 1 x 15 Calf raises on bar: 15x 2   02/26/23 NuStep L5 x 6 min Leg press 20lb 2x12 Seated rows 20lb and lats 15lb 2x10 Shoulder Ext 5lb 2x10 S2S holding red ball 2x10 Supine bridges 2x10 LE on pball small bridges, K2C, Oblq  02/24/23: Nustep L5 6 min Quadriped alt leg extension, alt arm ext 115 x 1 each Biceps curls 7# 15 reps each arm 3 way hip 2# 7 reps Paloff press with blue t band  2 x 10 B Step ups on 2nd step with light UE support B Push ups from rail on steps, cues to engage deep core musculature 15x  Standing post leans on wall for dead bugs emphasis on high marches to engage lower abs Standing facing wall for ladder climbs   02/20/23: Nustep L5 6 min Supine  stabilization exercises- Isometric hip abd/add with ball, belt, alternating, 5 sec holds, 10 reps Supine abdominal stabilization- mini marches with PPT, emphasizing form. 2 x 10 reps Prone with pillow under abdomen B TKE's, 5 sec holds,10 reps, alternating hamstring curls, 10 reps, alt hip ext 10 reps, 2-3 sec holds Side lying for clam shells 10 reps each Paloff press with 5#, 2 x 10 B. Step ups on 6" step 15 x each with B UE support  02/18/23 NuStep L5 x 6 min Sciatic assessment on R, no TTP, tightness, or TP noted in R glut or piriformis, Prox hs tendon neg for pain or tightness as well Supine stabilization exercises- Isometric hip abd/add Supine abdominal stabilization- mini marches with PPT, emphasizing form. 2 x 10 reps Quadruped stabilization, min TC to flatten back and activate trunk. Initiated alternating reaches with arms. Attempted reaching with legs, but he was too unstable through trunk, so deferred. Paloff press with 5#, 2 x 10 B. Step ups on 6" step with light UE support. Able to perform 6 on each leg before he started to compensate, so therapist stopped him.  EVAL- 02/14/23   PATIENT EDUCATION:  Education details: POC and HEP Person educated: Patient Education method: Explanation Education comprehension: verbalized understanding  HOME EXERCISE PROGRAM: Access Code: [email or web address] URL: [email or web address] Date: 02/14/2023 Prepared by: Cassie Freer  Exercises - Supine Bridge  - 1 x daily - 7 x weekly - 2 sets - 10 reps - Supine Lower Trunk Rotation  - 1 x daily - 7 x weekly - 2 sets - 10 reps - Supine Piriformis Stretch with Foot on Ground  - 1 x daily - 7 x weekly - 2 reps - 30 hold - Seated Hamstring Stretch  - 1 x daily - 7 x weekly - 2 reps - 30 hold - Supine Single Knee to Chest Stretch  - 1 x daily - 7 x weekly - 2 reps - 30 hold  ASSESSMENT:  CLINICAL IMPRESSION:  Patient enters ~14 mins late, reports continued progress, decreased pain, and improved  strength. Returned to work and is able to get through work day without pain but still has lot of tightness. Postural weakness present with shoulder ext. Constant cue needed during session to keep shoulders down and back. No reports of pain during session.   REHAB POTENTIAL: Good  CLINICAL DECISION MAKING: Stable/uncomplicated  EVALUATION COMPLEXITY: Low   GOALS: Goals reviewed with patient? Yes  SHORT TERM GOALS: Target date: 03/21/23  Patient will be independent with initial HEP.  Goal status: 02/18/23 met   LONG TERM GOALS: Target date: 04/18/23  Patient will be independent with advanced/ongoing HEP to improve outcomes and carryover.  Goal status: progressing  2.  Patient will report 75% improvement in low back pain to improve QOL.  Baseline: 5/10 Goal status: 03/19/23- Describes discomfort, rather than pain, tightness. Met  3.  Patient will demonstrate full pain free lumbar ROM to perform ADLs.   Goal status: progressing  4.  Patient will demonstrate improved functional strength as demonstrated by <14s on 5xSTS. Baseline: 20.39s, 13.6s Goal status: MET   PLAN:  PT FREQUENCY: 1-2x/week  PT DURATION: 8 weeks  PLANNED INTERVENTIONS: Therapeutic exercises, Therapeutic activity, Neuromuscular re-education, Balance training, Gait training, Patient/Family education, Self Care, Joint mobilization, Stair training, Dry Needling, Spinal manipulation, Spinal mobilization, Cryotherapy, Moist heat, Traction, Ionotophoresis /ml Dexamethasone, and Manual therapy.  PLAN FOR NEXT SESSION: progress strengthening and continue with stretching   Cassie Freer, PT, DPT 04/14/23 3:29 PM

## 2023-04-22 ENCOUNTER — Ambulatory Visit: Payer: Medicare HMO | Attending: Cardiovascular Disease

## 2023-04-22 DIAGNOSIS — Z79899 Other long term (current) drug therapy: Secondary | ICD-10-CM

## 2023-04-22 DIAGNOSIS — E7800 Pure hypercholesterolemia, unspecified: Secondary | ICD-10-CM | POA: Diagnosis not present

## 2023-04-22 LAB — ALT: ALT: 52 IU/L — ABNORMAL HIGH (ref 0–44)

## 2023-04-22 LAB — LIPID PANEL
Chol/HDL Ratio: 2.1 ratio (ref 0.0–5.0)
Cholesterol, Total: 146 mg/dL (ref 100–199)
HDL: 70 mg/dL (ref >39)
LDL Chol Calc (NIH): 60 mg/dL (ref 0–99)
Triglycerides: 88 mg/dL (ref 0–149)
VLDL Cholesterol Cal: 16 mg/dL (ref 5–40)

## 2023-04-23 ENCOUNTER — Ambulatory Visit: Payer: Medicare HMO | Admitting: Physical Therapy

## 2023-04-30 ENCOUNTER — Ambulatory Visit: Payer: Medicare HMO | Attending: Student | Admitting: Physical Therapy

## 2023-04-30 ENCOUNTER — Encounter: Payer: Self-pay | Admitting: Physical Therapy

## 2023-04-30 DIAGNOSIS — M5459 Other low back pain: Secondary | ICD-10-CM | POA: Diagnosis not present

## 2023-04-30 DIAGNOSIS — Z9889 Other specified postprocedural states: Secondary | ICD-10-CM | POA: Diagnosis not present

## 2023-04-30 DIAGNOSIS — M6281 Muscle weakness (generalized): Secondary | ICD-10-CM | POA: Insufficient documentation

## 2023-04-30 DIAGNOSIS — M62830 Muscle spasm of back: Secondary | ICD-10-CM | POA: Diagnosis not present

## 2023-04-30 NOTE — Therapy (Signed)
OUTPATIENT PHYSICAL THERAPY THORACOLUMBAR TREATMENT   Patient Name: Shawn Daniels MRN: [id number] DOB:August 25, 1956, 67 y.o., male Today's Date: 02/13/2023  END OF SESSION:   PT End of Session - 04/30/23 1534     Visit Number 13    Date for PT Re-Evaluation 04/18/23    PT Start Time 1534    PT Stop Time 1600    PT Time Calculation (min) 26 min    Activity Tolerance Patient tolerated treatment well    Behavior During Therapy [facility] for tasks assessed/performed                    Past Medical History:  Diagnosis Date   Diabetes mellitus without complication (HCC)    GERD (gastroesophageal reflux disease)    Hypertension    Peripheral vascular disease (HCC)    Past Surgical History:  Procedure Laterality Date   ABDOMINAL AORTOGRAM W/LOWER EXTREMITY Right 08/20/2022   Procedure: ABDOMINAL AORTOGRAM W/LOWER EXTREMITY;  Surgeon: Nada Libman, MD;  [facility];  Service: Cardiovascular;  Laterality: Right;   ABDOMINAL AORTOGRAM W/LOWER EXTREMITY N/A 11/19/2022   Procedure: ABDOMINAL AORTOGRAM W/LOWER EXTREMITY;  Surgeon: Nada Libman, MD;  [facility];  Service: Cardiovascular;  Laterality: N/A;   ENDARTERECTOMY FEMORAL Right 04/26/2022   Procedure: RIGHT FEMORAL ENDARTERECTOMY;  Surgeon: Nada Libman, MD;  Location: [facility] OR;  Service: Vascular;  Laterality: Right;   INTRAVASCULAR LITHOTRIPSY Left 11/19/2022   Procedure: INTRAVASCULAR LITHOTRIPSY;  Surgeon: Nada Libman, MD;  [facility];  Service: Cardiovascular;  Laterality: Left;   IR KYPHO LUMBAR INC FX REDUCE BONE BX UNI/BIL CANNULATION INC/IMAGING  01/13/2023   IR RADIOLOGIST EVAL & MGMT  01/29/2023   KNEE SURGERY     LOWER EXTREMITY ANGIOGRAPHY N/A 01/01/2022   Procedure: LOWER EXTREMITY ANGIOGRAPHY;  Surgeon: Elder Negus, MD;  [facility];  Service: Cardiovascular;  Laterality: N/A;   PATCH ANGIOPLASTY Right 04/26/2022   Procedure:  PATCH ANGIOPLASTY OF RIGHT FEMORAL ARTERY USING XENOSURE BOVINE PERCARDIUM PATCH;  Surgeon: Nada Libman, MD;  [facility];  Service: Vascular;  Laterality: Right;   PENILE PROSTHESIS IMPLANT     PERIPHERAL VASCULAR ATHERECTOMY  08/20/2022   Procedure: PERIPHERAL VASCULAR ATHERECTOMY;  Surgeon: Nada Libman, MD;  [facility];  Service: Cardiovascular;;  popliteal   PERIPHERAL VASCULAR BALLOON ANGIOPLASTY  01/08/2022   Procedure: PERIPHERAL VASCULAR BALLOON ANGIOPLASTY;  Surgeon: Yates Decamp, MD;  [facility];  Service: Cardiovascular;;   PERIPHERAL VASCULAR INTERVENTION Left 11/19/2022   Procedure: PERIPHERAL VASCULAR INTERVENTION;  Surgeon: Nada Libman, MD;  [facility];  Service: Cardiovascular;  Laterality: Left;   WRIST SURGERY     Patient Active Problem List   Diagnosis Date Noted   Shock circulatory (HCC) 02/06/2023   Lumbar compression fracture, closed, initial encounter (HCC) 01/05/2023   Alcohol dependence (HCC) 01/05/2023   Sinus tachycardia 10/08/2022   PAD (peripheral artery disease) (HCC) 04/26/2022   Hypertension 04/25/2022   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) 04/25/2022   HLD (hyperlipidemia) 04/03/2022   Claudication in peripheral vascular disease (HCC) 12/31/2021   Extensor tendon disruption 06/19/2020   Traumatic tear of supraspinatus tendon, right, initial encounter 12/02/2018   Rib contusion, right, initial encounter 12/02/2018   Trigger finger, acquired 06/29/2018   Mass of finger of left hand 11/01/2017   Closed fracture of first metacarpal bone of left hand  with routine healing, subsequent encounter 09/21/2017    PCP: Doreen Beam  REFERRING PROVIDER: Sandrea Matte  REFERRING DIAG:  S/P L1 Fracture surgery      Rationale for Evaluation and Treatment: Rehabilitation  THERAPY DIAG:  No diagnosis found.  ONSET DATE: 01/05/23  SUBJECTIVE:                                                                                                                                                                                            SUBJECTIVE STATEMENT: Doing ok, just weak  PERTINENT HISTORY:  Shawn Daniels is a 67 y.o. male with medical history significant of DM, HTN, and PVD presenting with a fall.  He reports that his blood sugar was low.  He got up to get some glucose tabs and fell onto the fire place hearth.  He had to crawl to the bathroom.  He doesn't think his glucose is low often.  He does not normally have back pain.  He is opposed to the lidoderm, doesn't think it will help.  Brace isn't helping much, not wearing TLSO.  No radicular symptoms   Kyphoplasty 01/13/22  PAIN:  Are you having pain? Yes: NPRS scale: 0/10 Pain location: low back Pain description: soreness, tightness  Aggravating factors: overextending myself Relieving factors: Tylenol, rest  PRECAUTIONS: None  WEIGHT BEARING RESTRICTIONS: No  FALLS:  Has patient fallen in last 6 months? Yes. 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 Status post vertebrobasilar augmentation for painful compression fracture at L1 using the balloon kyphoplasty technique.   If the patient has known osteoporosis, recommend treatment as clinically indicated. If the patient's bone density status is unknown, DEXA scan is recommended  SCREENING FOR RED FLAGS: Bowel or bladder incontinence: No Spinal tumors: No Cauda equina syndrome: No Compression fracture: No Abdominal aneurysm: No  COGNITION: Overall cognitive status: Within functional limits for tasks assessed     SENSATION: [facility]  MUSCLE  LENGTH: Hamstrings: mod tightness BLE  POSTURE: rounded shoulders  LUMBAR ROM:   AROM eval 03/27/23 04/30/23  Flexion WFL [facility] WFL  felt some tightness  Extension Very limited 75% Limited 50%  Right lateral flexion Mild limitations WFL Limited 25%  Left lateral flexion Mild limitations [facility] WFL  Right rotation 50% [facility] WFL  Left rotation 50% WFL WFL   (Blank rows =  not tested)  LOWER EXTREMITY ROM:  WFL   LOWER EXTREMITY MMT:    MMT Right eval Left eval Right 04/30/23 L 04/30/23  Hip flexion 4 4+ 4 5  Hip extension      Hip abduction 4 4 5 5   Hip adduction 5 5 5 5   Hip internal rotation      Hip external rotation      Knee flexion 5 5 5 5   Knee extension 5 5 4+ 5  Ankle dorsiflexion      Ankle plantarflexion      Ankle inversion      Ankle eversion       (Blank rows = not tested)  LUMBAR SPECIAL TESTS:  Straight leg raise test: Positive and FABER test: Negative  FUNCTIONAL TESTS:  5 times sit to stand: 20.39s,  04/30/23:  9.33      GAIT: Distance walked: in clinic distances Assistive device utilized: Single point cane and None Level of assistance: Complete Independence Comments: forward trunk lean, antalgic gait, decreased step length and foot clearance   TODAY'S TREATMENT:                                                                                                                              DATE:  04/30/23 NuStep L 5 x 5 min Checked ROM and MMT SLS unable to hold for more than 3 sec Alt box taps  Leg press 60lb 2x10 Shoulder Ext  15lb 2x10   04/14/23 NuStep L 5 x 6 min  Rows & Lats 55lb 2x12 Shoulder Ext 15lb 2x12 AR press 20lb 2x10 S2S OHP yellow ball 2x12  Overhead lumbar Ext yellow ball 2x10    04/03/23 NuStep L6 x18mins Seated Rows 55# 2x10 Lat pull downs 35# 2x10 blackTB ext 2x10 Horizontal abd blue 2x10 Pball stretching forward and rotations Stretching HS, IT band, knees to chest, piriformis 30s each Passive trunk rotations x10    03/27/23 NuStep L5 x20mins  Recheck ROM and 5xSTS Pec stretch 30s  Holding pull up handles and stretching 30s  Stretching HS, IT band, knees to chest, piriformis 30s each Shoulder ext 10# 2x10 Cable rows 20# 2x10  Leg press 50# 2x10 STS with chest press blue ball 2x10   03/19/23 NuStep L5 x 6 minutes Supine stretching with a strap HS, abd, add, 2 x 30 sec each, BLE. Quadruped, alternate arm reach with 4#, alternate leg ext, 5 each, bird dog, no weights 2 x 5 each side. B side to side step with G tband at knees, 2 x 10 reps. Palof press 10#, 2 x 10 reps each direction. Lat pulls, 10 reps 25#, 10 reps 35# Seated trunk extension against Black Tband resistance, 2 x 10 reps Seated alternately marching knees 2 x 10 reps each.  03/12/23 Pball flexion and rotations x10 each way  AR press 15# 2x10  Seated lats/rows 35# 2x10 blackTB crunches 2x10  Calf stretch 30s  Calf raises 2x12  Modified sit ups from wedge 2x10 HS stretch 30s x2 each side   03/10/23 Bike L4 x84mins Standing shoulder ext 10# x12, 15# x12 Cable rows 15# x12, 20# x12  Horizontal abd green 2x12 Deadbugs 2x10 Ab ball roll ups 2x10 Stretching HS, SK2C, double K2C, piriformis 30s each  Trunk rotations x10 Blacktb ext 2x10 Leg press 40# 2x10  03/03/23: Treadmill level 2 min, then 3.25 incline 4 min, level for final min, varying speed, 2.8 to 3.2 mph Leg press B 30lb 2x15, unilateral 20#, 2 x 15 each Seated rows 35lb and lats 25lb 2x15 Shoulder Ext 10lb 2x10 cables  S2S holding red ball 1x 15, yellow 1 x 15 Calf raises on bar: 15x 2   02/26/23 NuStep L5 x 6 min Leg press 20lb 2x12 Seated rows 20lb and lats 15lb 2x10 Shoulder Ext 5lb 2x10 S2S holding red ball 2x10 Supine bridges 2x10 LE on pball small bridges, K2C, Oblq    PATIENT EDUCATION:  Education details: POC and HEP Person educated: Patient Education method: Explanation Education comprehension: verbalized understanding  HOME EXERCISE  PROGRAM: Access Code: [email or web address] URL: [email or web address] Date: 02/14/2023 Prepared by: Cassie Freer  Exercises - Supine Bridge  - 1 x daily - 7 x weekly - 2 sets - 10 reps - Supine Lower Trunk Rotation  - 1 x daily - 7 x weekly - 2 sets - 10 reps - Supine Piriformis Stretch with Foot on Ground  - 1 x daily - 7 x weekly - 2 reps - 30 hold - Seated Hamstring Stretch  - 1 x daily - 7 x weekly - 2 reps - 30 hold - Supine Single Knee to Chest Stretch  - 1 x daily - 7 x weekly - 2 reps - 30 hold  ASSESSMENT:  CLINICAL IMPRESSION:  Patient enters ~16 mins late, reports continued progress, decreased pain, and improved strength. Overall ROM is OK, but he has some limitations with extension and R lateral side bending. RLE weakness present with MMT in comparison to the L.  Pt is very challenged with SLS balance unable to hold either LE for more than 3 seconds. Continues lumbar tightness also reported form pt. Pt would benefit form skilled PT serviced to increase LE strength, improve lumbar ROM, and SLS balance.   REHAB POTENTIAL: Good  CLINICAL DECISION MAKING: Stable/uncomplicated  EVALUATION COMPLEXITY: Low   GOALS: Goals reviewed with patient? Yes  SHORT TERM GOALS: Target date: 03/21/23  Patient will be independent with initial HEP.  Goal status: 02/18/23 met   LONG TERM GOALS: Target date: 04/18/23  Patient will be independent with advanced/ongoing HEP to improve outcomes and carryover.  Goal status: progressing  2.  Patient will report 75% improvement in low back pain to improve QOL.  Baseline: 5/10 Goal status: 95%  Met 04/30/23  3.  Patient will demonstrate full pain free lumbar ROM to perform ADLs.   Goal status: progressing 04/30/23  4.  Patient will demonstrate improved functional strength as demonstrated by <14s on 5xSTS. Baseline: 20.39s, 13.6s Goal status: MET   PLAN:  PT FREQUENCY: 1-2x/week  PT DURATION: 8 weeks  PLANNED INTERVENTIONS: Therapeutic  exercises, Therapeutic activity, Neuromuscular re-education, Balance training, Gait training, Patient/Family education, Self Care, Joint mobilization, Stair training, Dry Needling, Spinal manipulation, Spinal mobilization, Cryotherapy, Moist heat, Traction, Ionotophoresis 4mg /ml Dexamethasone, and Manual therapy.  PLAN FOR NEXT SESSION: progress strengthening and continue with stretching   Cassie Freer, PT, DPT Debroah Baller, PTA  04/30/23 3:35 PM

## 2023-05-06 ENCOUNTER — Ambulatory Visit: Payer: Medicare HMO | Admitting: Physical Therapy

## 2023-05-06 ENCOUNTER — Encounter: Payer: Self-pay | Admitting: Cardiovascular Disease

## 2023-05-06 ENCOUNTER — Encounter: Payer: Self-pay | Admitting: Physical Therapy

## 2023-05-06 DIAGNOSIS — Z9889 Other specified postprocedural states: Secondary | ICD-10-CM | POA: Diagnosis not present

## 2023-05-06 DIAGNOSIS — M62830 Muscle spasm of back: Secondary | ICD-10-CM

## 2023-05-06 DIAGNOSIS — M6281 Muscle weakness (generalized): Secondary | ICD-10-CM

## 2023-05-06 DIAGNOSIS — M5459 Other low back pain: Secondary | ICD-10-CM | POA: Diagnosis not present

## 2023-05-06 NOTE — Progress Notes (Signed)
Cardiology Office Note:    Date:  05/07/2023   ID:  THA JOSHLIN, DOB Aug 23, 1956, MRN [id number]  PCP:  Ignatius Specking, MD   [facility] Providers Cardiologist:   Britiany Silbernagel     Referring MD: Ignatius Specking, MD   Chief Complaint  Patient presents with   Hyperlipidemia     History of Present Illness:    Shawn Daniels is a 67 y.o. male with a hx of peripheral vascular disease, status post percutaneous angioplasty, coronary artery calcification ( coronary calcium score revealed total score 1523 placing patient in the 97th percentile.  )  Was previously see by [facility]  Had 2 separate PAD procedures but Dr. Nadara Eaton was not able to place a stent .  Had a mid R SFA stenosis, treated with andioplasty  - no stent was placed.  His symptoms returned within 3-4 weeks .  Was [place] the [place] and thought it was altitude. By his claudication symptoms did not improve with return to [employer] .   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 His last lipid profile from June, 2023 shows a total cholesterol of 188, HDL 62, triglyceride level is 305, LDL is 77  Had left leg stenting by [facility] recently open procedures procedures  He wants to recheck his lipids . If his Trigs   May 07, 2023 Shawn Daniels is seen for follow up of his HLD, PAD,  Blood pressure has been well-controlled.  His last LDL level was 60, HDL is 70, total cholesterol is 146, triglyceride level is 88. Is on repatha , rosuvastatin 5 days a week  HR remains fast. He didn't take his metoprolol at  the higher dose    Past Medical History:  Diagnosis Date   Diabetes mellitus without complication (HCC)    GERD (gastroesophageal reflux disease)    Hypertension    Peripheral vascular disease (HCC)     Past Surgical History:  Procedure Laterality Date   ABDOMINAL AORTOGRAM W/LOWER EXTREMITY Right 08/20/2022   Procedure: ABDOMINAL AORTOGRAM W/LOWER EXTREMITY;  Surgeon: Nada Libman, MD;  [facility];  Service: Cardiovascular;  Laterality: Right;   ABDOMINAL AORTOGRAM W/LOWER EXTREMITY N/A 11/19/2022   Procedure: ABDOMINAL AORTOGRAM W/LOWER EXTREMITY;  Surgeon: Nada Libman, MD;  [facility];  Service: Cardiovascular;  Laterality: N/A;   ENDARTERECTOMY FEMORAL Right 04/26/2022   Procedure: RIGHT FEMORAL ENDARTERECTOMY;  Surgeon: Nada Libman, MD;  [facility];  Service: Vascular;  Laterality: Right;   IR KYPHO LUMBAR INC FX REDUCE BONE BX UNI/BIL CANNULATION INC/IMAGING  01/13/2023   IR RADIOLOGIST EVAL & MGMT  01/29/2023   KNEE SURGERY     LOWER EXTREMITY ANGIOGRAPHY N/A 01/01/2022   Procedure: LOWER EXTREMITY ANGIOGRAPHY;  Surgeon: Elder Negus, MD;  [facility];  Service: Cardiovascular;  Laterality: N/A;   PATCH ANGIOPLASTY Right 04/26/2022   Procedure: PATCH ANGIOPLASTY OF RIGHT FEMORAL  ARTERY USING XENOSURE BOVINE PERCARDIUM PATCH;  Surgeon: Nada Libman, MD;  Location: [place] [facility] OR;  Service: Vascular;  Laterality: Right;   PENILE PROSTHESIS IMPLANT     PERIPHERAL INTRAVASCULAR LITHOTRIPSY Left 11/19/2022   Procedure: INTRAVASCULAR LITHOTRIPSY;  Surgeon: Nada Libman, MD;  [facility];  Service: Cardiovascular;  Laterality: Left;   PERIPHERAL VASCULAR ATHERECTOMY  08/20/2022   Procedure: PERIPHERAL VASCULAR ATHERECTOMY;  Surgeon: Nada Libman, MD;  [facility];  Service: Cardiovascular;;  popliteal   PERIPHERAL VASCULAR BALLOON ANGIOPLASTY  01/08/2022   Procedure: PERIPHERAL  VASCULAR BALLOON ANGIOPLASTY;  Surgeon: Yates Decamp, MD;  [facility];  Service: Cardiovascular;;   PERIPHERAL VASCULAR INTERVENTION Left 11/19/2022   Procedure: PERIPHERAL VASCULAR INTERVENTION;  Surgeon: Nada Libman, MD;  [facility];  Service: Cardiovascular;  Laterality: Left;   WRIST SURGERY      Current Medications: Current Meds  Medication Sig   aspirin EC 81 MG tablet Take 1 tablet (81 mg total) by mouth at bedtime.   clopidogrel (PLAVIX) 75 MG tablet Take 1 tablet (75 mg total) by mouth daily.   Empagliflozin-linaGLIPtin 25-5 MG TABS Take 1 tablet by mouth in the morning.   Evolocumab (REPATHA SURECLICK) 140 MG/ML SOAJ INJECT 1 PEN (140 MG) INTO THE SKIN EVERY 14 DAYS   fenofibrate (TRICOR) 145 MG tablet Take 1 tablet (145 mg total) by mouth daily.   lisinopril (PRINIVIL,ZESTRIL) 10 MG tablet Take 5 mg by mouth at bedtime.   Magnesium 400 MG CAPS Take 400 mg by mouth in the morning and at bedtime.   methocarbamol (ROBAXIN) 500 MG tablet Take 1 tablet (500 mg total) by mouth every 6 (six) hours as needed for muscle spasms.   metoprolol succinate (TOPROL XL) 25 MG 24 hr tablet Take 1 tablet (25 mg total) by mouth daily. (Patient taking differently: Take 12.5 mg by mouth at bedtime.)   Multiple Vitamins-Minerals (MULTIVITAMIN WITH MINERALS) tablet Take 1 tablet by mouth daily.   NOVOLIN 70/30 RELION (70-30) 100 UNIT/ML injection Inject 30 Units into the skin See admin instructions. Inject up to 30u under the skin twice daily, according to sliding scale   omeprazole (PRILOSEC) 20 MG capsule Take 20 mg by mouth daily before breakfast.   rosuvastatin (CRESTOR) 10 MG tablet Take 10 mg by mouth at bedtime.     Allergies:   Codeine, Keflex [cephalexin], and Penicillins   Social History   Socioeconomic History   Marital status: Married    Spouse name: Not on file   Number of children: 0   Years of education: Not on file   Highest education level:  Not on file  Occupational History   Occupation: deliver truck parts  Tobacco Use   Smoking status: Former    Packs/day: 1.50    Years: 30.00    Additional pack years: 0.00    Total pack years: 45.00    Types: Cigarettes, Cigars    Quit date: 2009    Years since quitting: 15.3   Smokeless tobacco: Never  Vaping Use   Vaping Use: Never used  Substance and Sexual Activity   Alcohol use: Yes    Alcohol/week: 3.0 standard drinks of alcohol    Types: 3 Cans of beer per week    Comment: daily   Drug use: Never   Sexual activity: Not on file  Other Topics Concern   Not on file  Social History Narrative   Not on  file   Social Determinants of Health   Financial Resource Strain: Not on file  Food Insecurity: No Food Insecurity (02/06/2023)   Hunger Vital Sign    Worried About Running Out of Food in the Last Year: Never true    Ran Out of Food in the Last Year: Never true  Transportation Needs: No Transportation Needs (02/06/2023)   PRAPARE - Administrator, Civil Service (Medical): No    Lack of Transportation (Non-Medical): No  Physical Activity: Not on file  Stress: Not on file  Social Connections: Not on file     Family History: The patient's family history includes Heart failure in his father.  ROS:   Please see the history of present illness.     All other systems reviewed and are negative.  EKGs/Labs/Other Studies Reviewed:    The following studies were reviewed today:   EKG:     Recent Labs: 01/06/2023: TSH 1.192 02/07/2023: Hemoglobin 11.8; Magnesium 2.1; Platelets 107 02/08/2023: BUN 9; Creatinine, Ser 0.69; Potassium 3.2; Sodium 135 04/22/2023: ALT 52  Recent Lipid Panel    Component Value Date/Time   CHOL 146 04/22/2023 0842   TRIG 88 04/22/2023 0842   HDL 70 04/22/2023 0842   CHOLHDL 2.1 04/22/2023 0842   LDLCALC 60 04/22/2023 0842     Risk Assessment/Calculations:           Physical Exam:    Physical Exam: Blood pressure 136/74,  pulse 93, height 6\' 1"  (1.854 m), weight 187 lb (84.8 kg), SpO2 93 %.     GEN:  Well nourished, well developed in no acute distress HEENT: Normal NECK: No JVD; No carotid bruits LYMPHATICS: No lymphadenopathy CARDIAC: RRR , no murmurs, rubs, gallops RESPIRATORY:  Clear to auscultation without rales, wheezing or rhonchi  ABDOMEN: Soft, non-tender, non-distended MUSCULOSKELETAL:  No edema; No deformity  SKIN: Warm and dry NEUROLOGIC:  Alert and oriented x 3   ASSESSMENT:    1. Hypercholesteremia   2. Sinus tachycardia   3. Primary hypertension   4. PAD (peripheral artery disease) (HCC)     PLAN:       Claudication:     2.  Hyperlipidemia:    labs look great.  Cont current meds.  He informed us that he may not be able to afford his Repatha when the price increases.  Cont rosuvastatin for now   3.  Sinus tachycardia :    He was only taking metoprolol 12.5 mg a day.  Increase his Toprol-XL to 25 mg a day.  Will see him again in 1 year with lipids, ALT, basic metabolic profile.           Medication Adjustments/Labs and Tests Ordered: Current medicines are reviewed at length with the patient today.  Concerns regarding medicines are outlined above.  Orders Placed This Encounter  Procedures   Lipid panel   ALT   Basic metabolic panel   No orders of the defined types were placed in this encounter.   Patient Instructions  Medication Instructions:  Your physician recommends that you continue on your current medications as directed. Please refer to the Current Medication list given to you today.  *If you need a refill on your cardiac medications before your next appointment, please call your pharmacy*  Follow-Up: At [facility], you and your health needs are our priority.  As part of our continuing mission to provide you with exceptional heart care, we have created designated Provider Care Teams.  These  Care Teams include your primary Cardiologist (physician)  and Advanced Practice Providers (APPs -  Physician Assistants and Nurse Practitioners) who all work together to provide you with the care you need, when you need it.   Your next appointment:   1 year(s) with labs  Provider:   Kristeen Miss, MD       Signed, Kristeen Miss, MD  05/07/2023 3:50 PM    [facility]

## 2023-05-06 NOTE — Therapy (Signed)
OUTPATIENT PHYSICAL THERAPY THORACOLUMBAR TREATMENT   Patient Name: Shawn Daniels MRN: [id number] DOB:[birth year], 67 y.o., male Today's Date: 02/13/2023  END OF SESSION:   PT End of Session - 05/06/23 1519     Visit Number 14    Date for PT Re-Evaluation 06/04/23    PT Start Time 1515    PT Stop Time 1545    PT Time Calculation (min) 30 min    Activity Tolerance Patient tolerated treatment well    Behavior During Therapy [facility] for tasks assessed/performed                    Past Medical History:  Diagnosis Date   Diabetes mellitus without complication (HCC)    GERD (gastroesophageal reflux disease)    Hypertension    Peripheral vascular disease (HCC)    Past Surgical History:  Procedure Laterality Date   ABDOMINAL AORTOGRAM W/LOWER EXTREMITY Right 08/20/2022   Procedure: ABDOMINAL AORTOGRAM W/LOWER EXTREMITY;  Surgeon: Nada Libman, MD;  [facility]: [facility];  Service: Cardiovascular;  Laterality: Right;   ABDOMINAL AORTOGRAM W/LOWER EXTREMITY N/A 11/19/2022   Procedure: ABDOMINAL AORTOGRAM W/LOWER EXTREMITY;  Surgeon: Nada Libman, MD;  Location: [facility];  Service: Cardiovascular;  Laterality: N/A;   ENDARTERECTOMY FEMORAL Right 04/26/2022   Procedure: RIGHT FEMORAL ENDARTERECTOMY;  Surgeon: Nada Libman, MD;  Location: [facility] OR;  Service: Vascular;  Laterality: Right;   INTRAVASCULAR LITHOTRIPSY Left 11/19/2022   Procedure: INTRAVASCULAR LITHOTRIPSY;  Surgeon: Nada Libman, MD;  [facility];  Service: Cardiovascular;  Laterality: Left;   IR KYPHO LUMBAR INC FX REDUCE BONE BX UNI/BIL CANNULATION INC/IMAGING  01/13/2023   IR RADIOLOGIST EVAL & MGMT  01/29/2023   KNEE SURGERY     LOWER EXTREMITY ANGIOGRAPHY N/A 01/01/2022   Procedure: LOWER EXTREMITY ANGIOGRAPHY;  Surgeon: Elder Negus, MD;  [facility];  Service: Cardiovascular;  Laterality: N/A;   PATCH ANGIOPLASTY Right 04/26/2022   Procedure:  PATCH ANGIOPLASTY OF RIGHT FEMORAL ARTERY USING XENOSURE BOVINE PERCARDIUM PATCH;  Surgeon: Nada Libman, MD;  Location: [facility];  Service: Vascular;  Laterality: Right;   PENILE PROSTHESIS IMPLANT     PERIPHERAL VASCULAR ATHERECTOMY  08/20/2022   Procedure: PERIPHERAL VASCULAR ATHERECTOMY;  Surgeon: Nada Libman, MD;  [facility];  Service: Cardiovascular;;  popliteal   PERIPHERAL VASCULAR BALLOON ANGIOPLASTY  01/08/2022   Procedure: PERIPHERAL VASCULAR BALLOON ANGIOPLASTY;  Surgeon: Yates Decamp, MD;  [facility];  Service: Cardiovascular;;   PERIPHERAL VASCULAR INTERVENTION Left 11/19/2022   Procedure: PERIPHERAL VASCULAR INTERVENTION;  Surgeon: Nada Libman, MD;  [facility];  Service: Cardiovascular;  Laterality: Left;   WRIST SURGERY     Patient Active Problem List   Diagnosis Date Noted   Shock circulatory (HCC) 02/06/2023   Lumbar compression fracture, closed, initial encounter (HCC) 01/05/2023   Alcohol dependence (HCC) 01/05/2023   Sinus tachycardia 10/08/2022   PAD (peripheral artery disease) (HCC) 04/26/2022   Hypertension 04/25/2022   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) 04/25/2022   HLD (hyperlipidemia) 04/03/2022   Claudication in peripheral vascular disease (HCC) 12/31/2021   Extensor tendon disruption 06/19/2020   Traumatic tear of supraspinatus tendon, right, initial encounter 12/02/2018   Rib contusion, right, initial encounter 12/02/2018   Trigger finger, acquired 06/29/2018   Mass of finger of left hand 11/01/2017   Closed fracture of first metacarpal bone of left hand  with routine healing, subsequent encounter 09/21/2017    PCP: Doreen Beam  REFERRING PROVIDER: Sandrea Matte  REFERRING DIAG:  S/P L1 Fracture surgery      Rationale for Evaluation and Treatment: Rehabilitation  THERAPY DIAG:  No diagnosis found.  ONSET DATE: 01/05/23  SUBJECTIVE:                                                                                                                                                                                            SUBJECTIVE STATEMENT: Doing ok, just weak  PERTINENT HISTORY:  Shawn Daniels is a 67 y.o. male with medical history significant of DM, HTN, and PVD presenting with a fall.  He reports that his blood sugar was low.  He got up to get some glucose tabs and fell onto the fire place hearth.  He had to crawl to the bathroom.  He doesn't think his glucose is low often.  He does not normally have back pain.  He is opposed to the lidoderm, doesn't think it will help.  Brace isn't helping much, not wearing TLSO.  No radicular symptoms   Kyphoplasty 01/13/22  PAIN:  Are you having pain? Yes: NPRS scale: 0/10 Pain location: low back Pain description: soreness, tightness  Aggravating factors: overextending myself Relieving factors: Tylenol, rest  PRECAUTIONS: None  WEIGHT BEARING RESTRICTIONS: No  FALLS:  Has patient fallen in last 6 months? Yes. 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 Status post vertebrobasilar augmentation for painful compression fracture at L1 using the balloon kyphoplasty technique.   If the patient has known osteoporosis, recommend treatment as clinically indicated. If the patient's bone density status is unknown, DEXA scan is recommended  SCREENING FOR RED FLAGS: Bowel or bladder incontinence: No Spinal tumors: No Cauda equina syndrome: No Compression fracture: No Abdominal aneurysm: No  COGNITION: Overall cognitive status: Within functional limits for tasks assessed     SENSATION: [facility]  MUSCLE  LENGTH: Hamstrings: mod tightness BLE  POSTURE: rounded shoulders  LUMBAR ROM:   AROM eval 03/27/23 04/30/23  Flexion WFL [facility] WFL  felt some tightness  Extension Very limited 75% Limited 50%  Right lateral flexion Mild limitations WFL Limited 25%  Left lateral flexion Mild limitations [facility] WFL  Right rotation 50% [facility] WFL  Left rotation 50% WFL WFL   (Blank rows =  not tested)  LOWER EXTREMITY ROM:  WFL   LOWER EXTREMITY MMT:    MMT Right eval Left eval Right 04/30/23 L 04/30/23  Hip flexion 4 4+ 4 5  Hip extension      Hip abduction 4 4 5 5   Hip adduction 5 5 5 5   Hip internal rotation      Hip external rotation      Knee flexion 5 5 5 5   Knee extension 5 5 4+ 5  Ankle dorsiflexion      Ankle plantarflexion      Ankle inversion      Ankle eversion       (Blank rows = not tested)  LUMBAR SPECIAL TESTS:  Straight leg raise test: Positive and FABER test: Negative  FUNCTIONAL TESTS:  5 times sit to stand: 20.39s,  04/30/23:  9.33      GAIT: Distance walked: in clinic distances Assistive device utilized: Single point cane and None Level of assistance: Complete Independence Comments: forward trunk lean, antalgic gait, decreased step length and foot clearance   TODAY'S TREATMENT:                                                                                                                              DATE:  05/06/23 NuStep L 5 x 6 min S2S OHP on airex w/ blue ball 2x12 Standing march on airex blue ball extended in UEs 2x10 Side step over foam roll onto airex. Seated Rows 55lb 2x12 Lats 45lb 2x0  04/30/23 NuStep L 5 x 5 min Checked ROM and MMT SLS unable to hold for more than 3 sec Alt box taps  Leg press 60lb 2x10 Shoulder Ext  15lb 2x10   04/14/23 NuStep L 5 x 6 min  Rows & Lats 55lb 2x12 Shoulder Ext 15lb 2x12 AR press 20lb 2x10 S2S OHP yellow ball 2x12  Overhead lumbar Ext yellow ball 2x10    04/03/23 NuStep L6 x43mins Seated Rows 55# 2x10 Lat pull downs  35# 2x10 blackTB ext 2x10 Horizontal abd blue 2x10 Pball stretching forward and rotations Stretching HS, IT band, knees to chest, piriformis 30s each Passive trunk rotations x10   03/27/23 NuStep L5 x45mins  Recheck ROM and 5xSTS Pec stretch 30s  Holding pull up handles and stretching 30s  Stretching HS, IT band, knees to chest, piriformis 30s each Shoulder ext 10# 2x10 Cable rows 20# 2x10  Leg press 50# 2x10 STS with chest press blue ball 2x10   03/19/23 NuStep L5 x 6 minutes Supine stretching with a strap HS, abd, add, 2 x 30 sec each, BLE. Quadruped, alternate arm reach with 4#, alternate leg ext, 5 each, bird dog, no weights 2 x 5 each side. B side to side step with G tband at knees, 2 x 10 reps. Palof press 10#, 2 x 10 reps each direction. Lat pulls, 10 reps 25#, 10 reps 35# Seated trunk extension against Black Tband resistance, 2 x 10  reps Seated alternately marching knees 2 x 10 reps each.  03/12/23 Pball flexion and rotations x10 each way  AR press 15# 2x10  Seated lats/rows 35# 2x10 blackTB crunches 2x10  Calf stretch 30s  Calf raises 2x12 Modified sit ups from wedge 2x10 HS stretch 30s x2 each side   03/10/23 Bike L4 x27mins Standing shoulder ext 10# x12, 15# x12 Cable rows 15# x12, 20# x12  Horizontal abd green 2x12 Deadbugs 2x10 Ab ball roll ups 2x10 Stretching HS, SK2C, double K2C, piriformis 30s each  Trunk rotations x10 Blacktb ext 2x10 Leg press 40# 2x10     PATIENT EDUCATION:  Education details: POC and HEP Person educated: Patient Education method: Explanation Education comprehension: verbalized understanding  HOME EXERCISE PROGRAM: Access Code: [email or web address] URL: [email or web address] Date: 02/14/2023 Prepared by: Cassie Freer  Exercises - Supine Bridge  - 1 x daily - 7 x weekly - 2 sets - 10 reps - Supine Lower Trunk Rotation  - 1 x daily - 7 x weekly - 2 sets - 10 reps - Supine Piriformis Stretch with Foot on Ground  - 1 x  daily - 7 x weekly - 2 reps - 30 hold - Seated Hamstring Stretch  - 1 x daily - 7 x weekly - 2 reps - 30 hold - Supine Single Knee to Chest Stretch  - 1 x daily - 7 x weekly - 2 reps - 30 hold  ASSESSMENT:  CLINICAL IMPRESSION:  Patient enters reporting he needed to leave early to meed his lawyer.  He has continued to progress decreased pain and improved strength. Pt remains challenged with balance activities. CGA needed with standing march on airex to stay centered. Postural cues needed with seated rows. Pt would benefit form skilled PT serviced to increase LE strength, improve lumbar ROM, and SLS balance.   REHAB POTENTIAL: Good  CLINICAL DECISION MAKING: Stable/uncomplicated  EVALUATION COMPLEXITY: Low   GOALS: Goals reviewed with patient? Yes  SHORT TERM GOALS: Target date: 03/21/23  Patient will be independent with initial HEP.  Goal status: 02/18/23 met   LONG TERM GOALS: Target date: 04/18/23  Patient will be independent with advanced/ongoing HEP to improve outcomes and carryover.  Goal status: progressing  2.  Patient will report 75% improvement in low back pain to improve QOL.  Baseline: 5/10 Goal status: 95%  Met 04/30/23  3.  Patient will demonstrate full pain free lumbar ROM to perform ADLs.   Goal status: progressing 04/30/23  4.  Patient will demonstrate improved functional strength as demonstrated by <14s on 5xSTS. Baseline: 20.39s, 13.6s Goal status: MET   PLAN:  PT FREQUENCY: 1-2x/week  PT DURATION: 8 weeks  PLANNED INTERVENTIONS: Therapeutic exercises, Therapeutic activity, Neuromuscular re-education, Balance training, Gait training, Patient/Family education, Self Care, Joint mobilization, Stair training, Dry Needling, Spinal manipulation, Spinal mobilization, Cryotherapy, Moist heat, Traction, Ionotophoresis 4mg /ml Dexamethasone, and Manual therapy.  PLAN FOR NEXT SESSION: progress strengthening and continue with stretching   Cassie Freer, PT,  DPT Debroah Baller, PTA  05/06/23 3:19 PM

## 2023-05-07 ENCOUNTER — Ambulatory Visit: Payer: Medicare HMO | Attending: Cardiovascular Disease | Admitting: Cardiovascular Disease

## 2023-05-07 VITALS — BP 136/74 | HR 93 | Ht 73.0 in | Wt 187.0 lb

## 2023-05-07 DIAGNOSIS — R000 Tachycardia, unspecified: Secondary | ICD-10-CM

## 2023-05-07 DIAGNOSIS — E7800 Pure hypercholesterolemia, unspecified: Secondary | ICD-10-CM

## 2023-05-07 DIAGNOSIS — I10 Essential (primary) hypertension: Secondary | ICD-10-CM

## 2023-05-07 DIAGNOSIS — I739 Peripheral vascular disease, unspecified: Secondary | ICD-10-CM

## 2023-05-07 NOTE — Patient Instructions (Signed)
Medication Instructions:  Your physician recommends that you continue on your current medications as directed. Please refer to the Current Medication list given to you today.  *If you need a refill on your cardiac medications before your next appointment, please call your pharmacy*  Follow-Up: At [facility], you and your health needs are our priority.  As part of our continuing mission to provide you with exceptional heart care, we have created designated Provider Care Teams.  These Care Teams include your primary Cardiologist (physician) and Advanced Practice Providers (APPs -  Physician Assistants and Nurse Practitioners) who all work together to provide you with the care you need, when you need it.   Your next appointment:   1 year(s) with labs  Provider:   Kristeen Miss, MD

## 2023-05-11 ENCOUNTER — Other Ambulatory Visit: Payer: Self-pay | Admitting: Family Medicine

## 2023-05-16 DIAGNOSIS — R000 Tachycardia, unspecified: Secondary | ICD-10-CM | POA: Diagnosis not present

## 2023-05-16 DIAGNOSIS — E161 Other hypoglycemia: Secondary | ICD-10-CM | POA: Diagnosis not present

## 2023-05-16 DIAGNOSIS — R410 Disorientation, unspecified: Secondary | ICD-10-CM | POA: Diagnosis not present

## 2023-05-16 DIAGNOSIS — Z743 Need for continuous supervision: Secondary | ICD-10-CM | POA: Diagnosis not present

## 2023-05-16 DIAGNOSIS — E162 Hypoglycemia, unspecified: Secondary | ICD-10-CM | POA: Diagnosis not present

## 2023-05-22 ENCOUNTER — Ambulatory Visit: Payer: Medicare HMO | Admitting: Physical Therapy

## 2023-05-22 ENCOUNTER — Encounter: Payer: Self-pay | Admitting: Physical Therapy

## 2023-05-22 DIAGNOSIS — M62830 Muscle spasm of back: Secondary | ICD-10-CM | POA: Diagnosis not present

## 2023-05-22 DIAGNOSIS — Z9889 Other specified postprocedural states: Secondary | ICD-10-CM

## 2023-05-22 DIAGNOSIS — M6281 Muscle weakness (generalized): Secondary | ICD-10-CM | POA: Diagnosis not present

## 2023-05-22 DIAGNOSIS — M5459 Other low back pain: Secondary | ICD-10-CM | POA: Diagnosis not present

## 2023-05-22 NOTE — Therapy (Signed)
OUTPATIENT PHYSICAL THERAPY THORACOLUMBAR TREATMENT   Patient Name: Shawn Daniels MRN: [id number] DOB:December 19, 1956, 67 y.o., male Today's Date: 02/13/2023  END OF SESSION:   PT End of Session - 05/22/23 1518     Visit Number 15    Date for PT Re-Evaluation 06/04/23    PT Start Time 1518    PT Stop Time 1600    PT Time Calculation (min) 42 min    Activity Tolerance Patient tolerated treatment well    Behavior During Therapy [facility] for tasks assessed/performed                    Past Medical History:  Diagnosis Date   Diabetes mellitus without complication (HCC)    GERD (gastroesophageal reflux disease)    Hypertension    Peripheral vascular disease (HCC)    Past Surgical History:  Procedure Laterality Date   ABDOMINAL AORTOGRAM W/LOWER EXTREMITY Right 08/20/2022   Procedure: ABDOMINAL AORTOGRAM W/LOWER EXTREMITY;  Surgeon: Nada Libman, MD;  [facility];  Service: Cardiovascular;  Laterality: Right;   ABDOMINAL AORTOGRAM W/LOWER EXTREMITY N/A 11/19/2022   Procedure: ABDOMINAL AORTOGRAM W/LOWER EXTREMITY;  Surgeon: Nada Libman, MD;  [facility];  Service: Cardiovascular;  Laterality: N/A;   ENDARTERECTOMY FEMORAL Right 04/26/2022   Procedure: RIGHT FEMORAL ENDARTERECTOMY;  Surgeon: Nada Libman, MD;  Location: [facility] OR;  Service: Vascular;  Laterality: Right;   INTRAVASCULAR LITHOTRIPSY Left 11/19/2022   Procedure: INTRAVASCULAR LITHOTRIPSY;  Surgeon: Nada Libman, MD;  [facility];  Service: Cardiovascular;  Laterality: Left;   IR KYPHO LUMBAR INC FX REDUCE BONE BX UNI/BIL CANNULATION INC/IMAGING  01/13/2023   IR RADIOLOGIST EVAL & MGMT  01/29/2023   KNEE SURGERY     LOWER EXTREMITY ANGIOGRAPHY N/A 01/01/2022   Procedure: LOWER EXTREMITY ANGIOGRAPHY;  Surgeon: Elder Negus, MD;  [facility];  Service: Cardiovascular;  Laterality: N/A;   PATCH ANGIOPLASTY Right 04/26/2022   Procedure:  PATCH ANGIOPLASTY OF RIGHT FEMORAL ARTERY USING XENOSURE BOVINE PERCARDIUM PATCH;  Surgeon: Nada Libman, MD;  [facility];  Service: Vascular;  Laterality: Right;   PENILE PROSTHESIS IMPLANT     PERIPHERAL VASCULAR ATHERECTOMY  08/20/2022   Procedure: PERIPHERAL VASCULAR ATHERECTOMY;  Surgeon: Nada Libman, MD;  [facility];  Service: Cardiovascular;;  popliteal   PERIPHERAL VASCULAR BALLOON ANGIOPLASTY  01/08/2022   Procedure: PERIPHERAL VASCULAR BALLOON ANGIOPLASTY;  Surgeon: Yates Decamp, MD;  [facility];  Service: Cardiovascular;;   PERIPHERAL VASCULAR INTERVENTION Left 11/19/2022   Procedure: PERIPHERAL VASCULAR INTERVENTION;  Surgeon: Nada Libman, MD;  [facility];  Service: Cardiovascular;  Laterality: Left;   WRIST SURGERY     Patient Active Problem List   Diagnosis Date Noted   Shock circulatory (HCC) 02/06/2023   Lumbar compression fracture, closed, initial encounter (HCC) 01/05/2023   Alcohol dependence (HCC) 01/05/2023   Sinus tachycardia 10/08/2022   PAD (peripheral artery disease) (HCC) 04/26/2022   Hypertension 04/25/2022   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) 04/25/2022   HLD (hyperlipidemia) 04/03/2022   Claudication in peripheral vascular disease (HCC) 12/31/2021   Extensor tendon disruption 06/19/2020   Traumatic tear of supraspinatus tendon, right, initial encounter 12/02/2018   Rib contusion, right, initial encounter 12/02/2018   Trigger finger, acquired 06/29/2018   Mass of finger of left hand 11/01/2017   Closed fracture of first metacarpal bone of left hand  with routine healing, subsequent encounter 09/21/2017    PCP: Doreen Beam  REFERRING PROVIDER: Sandrea Matte  REFERRING DIAG:  S/P L1 Fracture surgery      Rationale for Evaluation and Treatment: Rehabilitation  THERAPY DIAG:  No diagnosis found.  ONSET DATE: 01/05/23  SUBJECTIVE:                                                                                                                                                                                            SUBJECTIVE STATEMENT: "No more tightness in my back, I think this is my last one"  PERTINENT HISTORY:  Shawn Daniels is a 67 y.o. male with medical history significant of DM, HTN, and PVD presenting with a fall.  He reports that his blood sugar was low.  He got up to get some glucose tabs and fell onto the fire place hearth.  He had to crawl to the bathroom.  He doesn't think his glucose is low often.  He does not normally have back pain.  He is opposed to the lidoderm, doesn't think it will help.  Brace isn't helping much, not wearing TLSO.  No radicular symptoms   Kyphoplasty 01/13/22  PAIN:  Are you having pain? Yes: NPRS scale: 0/10 Pain location: low back Pain description: soreness, tightness  Aggravating factors: overextending myself Relieving factors: Tylenol, rest  PRECAUTIONS: None  WEIGHT BEARING RESTRICTIONS: No  FALLS:  Has patient fallen in last 6 months? Yes. 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 Status post vertebrobasilar augmentation for painful compression fracture at L1 using the balloon kyphoplasty technique.   If the patient has known osteoporosis, recommend treatment as clinically indicated. If the patient's bone density status is unknown, DEXA scan is recommended  SCREENING FOR RED FLAGS: Bowel or bladder incontinence: No Spinal tumors: No Cauda equina syndrome: No Compression fracture: No Abdominal aneurysm: No  COGNITION: Overall cognitive status: Within functional limits for tasks  assessed     SENSATION: [facility]  MUSCLE LENGTH: Hamstrings: mod tightness BLE  POSTURE: rounded shoulders  LUMBAR ROM:   AROM eval 03/27/23 04/30/23  Flexion WFL [facility] WFL  felt some tightness  Extension Very limited 75% Limited 50%  Right lateral flexion Mild limitations WFL Limited 25%  Left lateral flexion Mild limitations [facility] WFL  Right rotation 50% [facility]  Left rotation 50% WFL WFL   (Blank rows = not tested)  LOWER EXTREMITY ROM:  WFL   LOWER EXTREMITY MMT:    MMT Right eval Left eval Right 04/30/23 L 04/30/23  Hip flexion 4 4+ 4 5  Hip extension      Hip abduction 4 4 5 5   Hip adduction 5 5 5 5   Hip internal rotation      Hip external rotation      Knee flexion 5 5 5 5   Knee extension 5 5 4+ 5  Ankle dorsiflexion      Ankle plantarflexion      Ankle inversion      Ankle eversion       (Blank rows = not tested)  LUMBAR SPECIAL TESTS:  Straight leg raise test: Positive and FABER test: Negative  FUNCTIONAL TESTS:  5 times sit to stand: 20.39s,  04/30/23:  9.33      GAIT: Distance walked: in clinic distances Assistive device utilized: Single point cane and None Level of assistance: Complete Independence Comments: forward trunk lean, antalgic gait, decreased step length and foot clearance   TODAY'S TREATMENT:                                                                                                                              DATE:  05/22/23 NuStep L 5 x 6 min S2S OHP blue ball on airex 2x10 AR press 20lb x10 each Seated Rows 55lb 2x12 Lats 45lb 2x0 Leg press 80lb 2x12  05/06/23 NuStep L 5 x 6 min S2S OHP on airex w/ blue ball 2x12 Standing march on airex blue ball extended in UEs 2x10 Side step over foam roll onto airex. Seated Rows 55lb 2x12 Lats 45lb 2x0  04/30/23 NuStep L 5 x 5 min Checked ROM and MMT SLS unable to hold for more than 3 sec Alt box taps  Leg press 60lb 2x10 Shoulder Ext  15lb 2x10   04/14/23 NuStep L 5 x 6 min  Rows & Lats  55lb 2x12 Shoulder Ext 15lb 2x12 AR press 20lb 2x10 S2S OHP yellow ball 2x12  Overhead lumbar Ext yellow ball 2x10    04/03/23 NuStep L6 x64mins Seated Rows 55# 2x10 Lat pull downs 35# 2x10 blackTB ext 2x10 Horizontal abd blue 2x10 Pball stretching forward and rotations Stretching HS, IT band, knees to chest, piriformis 30s each Passive trunk rotations x10    PATIENT EDUCATION:  Education details: POC and HEP Person educated: Patient Education method: Explanation Education comprehension: verbalized understanding  HOME EXERCISE PROGRAM: Access Code: [email or web address] URL: [email or web address] Date: 02/14/2023 Prepared by: Cassie Freer  Exercises - Supine Bridge  - 1 x daily - 7 x weekly - 2 sets - 10 reps - Supine Lower Trunk Rotation  - 1 x daily - 7 x weekly - 2 sets - 10 reps - Supine Piriformis Stretch with Foot on Ground  - 1 x daily - 7 x weekly - 2  reps - 30 hold - Seated Hamstring Stretch  - 1 x daily - 7 x weekly - 2 reps - 30 hold - Supine Single Knee to Chest Stretch  - 1 x daily - 7 x weekly - 2 reps - 30 hold  ASSESSMENT:  CLINICAL IMPRESSION:  Patient enters reporting he needed to leave early to attend a zoom meeting with his church. He enters reporting no more tightness in his low back. He reports that he goes to the gym 3 times a week. He is pleased with is current functional status and reports no functional limitation.  REHAB POTENTIAL: Good  CLINICAL DECISION MAKING: Stable/uncomplicated  EVALUATION COMPLEXITY: Low   GOALS: Goals reviewed with patient? Yes  SHORT TERM GOALS: Target date: 03/21/23  Patient will be independent with initial HEP.  Goal status: 02/18/23 met   LONG TERM GOALS: Target date: 04/18/23  Patient will be independent with advanced/ongoing HEP to improve outcomes and carryover.  Goal status: progressing  2.  Patient will report 75% improvement in low back pain to improve QOL.  Baseline: 5/10 Goal status: 95%  Met  04/30/23  3.  Patient will demonstrate full pain free lumbar ROM to perform ADLs.   Goal status: progressing 04/30/23  4.  Patient will demonstrate improved functional strength as demonstrated by <14s on 5xSTS. Baseline: 20.39s, 13.6s Goal status: MET   PLAN:  PT FREQUENCY: 1-2x/week  PT DURATION: 8 weeks  PLANNED INTERVENTIONS: Therapeutic exercises, Therapeutic activity, Neuromuscular re-education, Balance training, Gait training, Patient/Family education, Self Care, Joint mobilization, Stair training, Dry Needling, Spinal manipulation, Spinal mobilization, Cryotherapy, Moist heat, Traction, Ionotophoresis 4mg /ml Dexamethasone, and Manual therapy.  PLAN FOR NEXT SESSION: progress strengthening and continue with stretching  PHYSICAL THERAPY DISCHARGE SUMMARY  Visits from Start of Care: 15   Patient agrees to discharge. Patient goals were partially met. Patient is being discharged due to being pleased with the current functional level.   Debroah Baller, PTA  05/22/23 3:18 PM

## 2023-05-26 ENCOUNTER — Telehealth: Payer: Self-pay

## 2023-05-26 NOTE — Telephone Encounter (Signed)
*  [facility]  PA request received via CMM for Repatha SureClick 140MG /ML auto-injectors  PA submitted to [facility] and is pending additional questions/determination  Key: [id number]

## 2023-05-27 NOTE — Telephone Encounter (Signed)
Received fax with Repatha approval through 12/23/23.

## 2023-05-28 DIAGNOSIS — E7800 Pure hypercholesterolemia, unspecified: Secondary | ICD-10-CM | POA: Diagnosis not present

## 2023-05-28 DIAGNOSIS — Z79899 Other long term (current) drug therapy: Secondary | ICD-10-CM | POA: Diagnosis not present

## 2023-05-28 DIAGNOSIS — Z125 Encounter for screening for malignant neoplasm of prostate: Secondary | ICD-10-CM | POA: Diagnosis not present

## 2023-05-28 DIAGNOSIS — Z299 Encounter for prophylactic measures, unspecified: Secondary | ICD-10-CM | POA: Diagnosis not present

## 2023-05-28 DIAGNOSIS — I10 Essential (primary) hypertension: Secondary | ICD-10-CM | POA: Diagnosis not present

## 2023-05-28 DIAGNOSIS — F419 Anxiety disorder, unspecified: Secondary | ICD-10-CM | POA: Diagnosis not present

## 2023-05-28 DIAGNOSIS — Z1331 Encounter for screening for depression: Secondary | ICD-10-CM | POA: Diagnosis not present

## 2023-05-28 DIAGNOSIS — Z1339 Encounter for screening examination for other mental health and behavioral disorders: Secondary | ICD-10-CM | POA: Diagnosis not present

## 2023-05-28 DIAGNOSIS — Z0000 Encounter for general adult medical examination without abnormal findings: Secondary | ICD-10-CM | POA: Diagnosis not present

## 2023-05-28 DIAGNOSIS — Z87891 Personal history of nicotine dependence: Secondary | ICD-10-CM | POA: Diagnosis not present

## 2023-05-28 DIAGNOSIS — Z7189 Other specified counseling: Secondary | ICD-10-CM | POA: Diagnosis not present

## 2023-06-10 ENCOUNTER — Other Ambulatory Visit: Payer: Self-pay | Admitting: Cardiovascular Disease

## 2023-06-19 ENCOUNTER — Ambulatory Visit (HOSPITAL_BASED_OUTPATIENT_CLINIC_OR_DEPARTMENT_OTHER)
Admission: RE | Admit: 2023-06-19 | Discharge: 2023-06-19 | Disposition: A | Discharge status: Home / Self Care | Payer: Medicare HMO | Source: Ambulatory Visit | Attending: Family Medicine | Admitting: Family Medicine

## 2023-06-19 ENCOUNTER — Other Ambulatory Visit: Payer: Self-pay

## 2023-06-19 ENCOUNTER — Ambulatory Visit: Payer: Medicare HMO | Admitting: Family Medicine

## 2023-06-19 ENCOUNTER — Encounter: Payer: Self-pay | Admitting: Family Medicine

## 2023-06-19 VITALS — BP 100/72 | Ht 73.0 in | Wt 192.0 lb

## 2023-06-19 DIAGNOSIS — M545 Low back pain, unspecified: Secondary | ICD-10-CM | POA: Insufficient documentation

## 2023-06-19 DIAGNOSIS — I700 Atherosclerosis of aorta: Secondary | ICD-10-CM | POA: Diagnosis not present

## 2023-06-19 MED ORDER — TRAMADOL HCL 50 MG PO TABS: 50.0000 mg | ORAL_TABLET | Freq: Four times a day (QID) | ORAL | 0 refills | Status: DC | PRN

## 2023-06-19 MED ORDER — TIZANIDINE HCL 4 MG PO TABS: 4.0000 mg | ORAL_TABLET | Freq: Three times a day (TID) | ORAL | 1 refills | Status: DC | PRN

## 2023-06-19 NOTE — Patient Instructions (Signed)
You have a lumbar strain. We'll call you if we need to do anything different after the radiologist read. Try tizanidine instead of the robaxin for muscle spasms. Tylenol 500mg  1-2 tabs three times a day as needed. Tramadol as needed for severe pain. Heat 15 minutes at a time 3-4 times a day. Consider physical therapy if you're not improving. Follow up with me as needed if you're improving.

## 2023-06-19 NOTE — Progress Notes (Signed)
PCP: Ignatius Specking, MD  Subjective:   HPI: Patient is a 67 y.o. male here for low back pain.  Patient reports he had a compression fracture L1 earlier this year with kyphoplasty. He completely improved from this. Then in past week he developed left side low back pain - thinks from lifting heavy things at work though no acute injury. No radiation of pain. No pain in center of back. No numbness/tingling, bowel/bladder dysfunction. He's tried ibuprofen, robaxin.  Past Medical History:  Diagnosis Date   Diabetes mellitus without complication (HCC)    GERD (gastroesophageal reflux disease)    Hypertension    Peripheral vascular disease (HCC)     Current Outpatient Medications on File Prior to Visit  Medication Sig Dispense Refill   aspirin EC 81 MG tablet Take 1 tablet (81 mg total) by mouth at bedtime. 30 tablet 12   clopidogrel (PLAVIX) 75 MG tablet Take 1 tablet (75 mg total) by mouth daily. 90 tablet 2   Empagliflozin-linaGLIPtin 25-5 MG TABS Take 1 tablet by mouth in the morning.     Evolocumab (REPATHA SURECLICK) 140 MG/ML SOAJ INJECT 1 PEN (140 MG) INTO THE SKIN EVERY 14 DAYS 6 mL 1   fenofibrate (TRICOR) 145 MG tablet Take 1 tablet by mouth once daily 90 tablet 3   lisinopril (PRINIVIL,ZESTRIL) 10 MG tablet Take 5 mg by mouth at bedtime.     Magnesium 400 MG CAPS Take 400 mg by mouth in the morning and at bedtime.     methocarbamol (ROBAXIN) 500 MG tablet Take 1 tablet (500 mg total) by mouth every 6 (six) hours as needed for muscle spasms. 30 tablet 0   metoprolol succinate (TOPROL XL) 25 MG 24 hr tablet Take 1 tablet (25 mg total) by mouth daily. (Patient taking differently: Take 12.5 mg by mouth at bedtime.) 90 tablet 3   Multiple Vitamins-Minerals (MULTIVITAMIN WITH MINERALS) tablet Take 1 tablet by mouth daily.     nitroGLYCERIN (NITRODUR - DOSED IN MG/24 HR) 0.2 mg/hr patch APPLY 1/4 (ONE-FOURTH) PATCH TO AFFECTED ELBOW, CHANGE DAILY (Patient not taking: Reported on  05/07/2023) 30 patch 1   NOVOLIN 70/30 RELION (70-30) 100 UNIT/ML injection Inject 30 Units into the skin See admin instructions. Inject up to 30u under the skin twice daily, according to sliding scale     omeprazole (PRILOSEC) 20 MG capsule Take 20 mg by mouth daily before breakfast.     rosuvastatin (CRESTOR) 10 MG tablet Take 10 mg by mouth at bedtime.     Current Facility-Administered Medications on File Prior to Visit  Medication Dose Route Frequency Provider Last Rate Last Admin   iodixanol (VISIPAQUE) 320 MG/ML injection    PRN Nada Libman, MD   120 mL at 08/20/22 1145    Past Surgical History:  Procedure Laterality Date   ABDOMINAL AORTOGRAM W/LOWER EXTREMITY Right 08/20/2022   Procedure: ABDOMINAL AORTOGRAM W/LOWER EXTREMITY;  Surgeon: Nada Libman, MD;  [facility];  Service: Cardiovascular;  Laterality: Right;   ABDOMINAL AORTOGRAM W/LOWER EXTREMITY N/A 11/19/2022   Procedure: ABDOMINAL AORTOGRAM W/LOWER EXTREMITY;  Surgeon: Nada Libman, MD;  Location: [facility];  Service: Cardiovascular;  Laterality: N/A;   ENDARTERECTOMY FEMORAL Right 04/26/2022   Procedure: RIGHT FEMORAL ENDARTERECTOMY;  Surgeon: Nada Libman, MD;  Location: [facility];  Service: Vascular;  Laterality: Right;   IR KYPHO LUMBAR INC FX REDUCE BONE BX UNI/BIL CANNULATION INC/IMAGING  01/13/2023   IR RADIOLOGIST EVAL & MGMT  01/29/2023   KNEE SURGERY     LOWER EXTREMITY ANGIOGRAPHY N/A 01/01/2022   Procedure: LOWER EXTREMITY ANGIOGRAPHY;  Surgeon: Elder Negus, MD;  [facility]: [facility];  Service: Cardiovascular;  Laterality: N/A;   PATCH ANGIOPLASTY Right 04/26/2022   Procedure: PATCH ANGIOPLASTY OF RIGHT FEMORAL ARTERY USING XENOSURE BOVINE PERCARDIUM PATCH;  Surgeon: Nada Libman, MD;  Location: [facility];  Service: Vascular;  Laterality: Right;   PENILE PROSTHESIS IMPLANT     PERIPHERAL INTRAVASCULAR LITHOTRIPSY Left 11/19/2022   Procedure: INTRAVASCULAR  LITHOTRIPSY;  Surgeon: Nada Libman, MD;  [facility];  Service: Cardiovascular;  Laterality: Left;   PERIPHERAL VASCULAR ATHERECTOMY  08/20/2022   Procedure: PERIPHERAL VASCULAR ATHERECTOMY;  Surgeon: Nada Libman, MD;  [facility];  Service: Cardiovascular;;  popliteal   PERIPHERAL VASCULAR BALLOON ANGIOPLASTY  01/08/2022   Procedure: PERIPHERAL VASCULAR BALLOON ANGIOPLASTY;  Surgeon: Yates Decamp, MD;  [facility];  Service: Cardiovascular;;   PERIPHERAL VASCULAR INTERVENTION Left 11/19/2022   Procedure: PERIPHERAL VASCULAR INTERVENTION;  Surgeon: Nada Libman, MD;  [facility]: [facility];  Service: Cardiovascular;  Laterality: Left;   WRIST SURGERY      Allergies  Allergen Reactions   Codeine Itching and Other (See Comments)    Can take/tolerate in lower doses   Keflex [Cephalexin] Other (See Comments)    "Shriveled my skin"   Penicillins Other (See Comments)    Reaction from childhood not recalled    BP 100/72 (BP Location: Left Arm, Patient Position: Sitting)   Ht 6\' 1"  (1.854 m)   Wt 192 lb (87.1 kg)   BMI 25.33 kg/m       No data to display              No data to display              Objective:  Physical Exam:  Gen: NAD, comfortable in exam room  Back: No gross deformity, scoliosis. TTP left lumbar paraspinal region mildly.  No midline or bony TTP. Flexion and extension limited by pain. Strength LEs 5/5 all muscle groups.   Trace MSRs in patellar and achilles tendons, equal bilaterally. Negative SLRs. Sensation intact to light touch bilaterally.   Assessment & Plan:  1. Low back pain - without radicular symptoms.  History of compression fracture - encouraged bone density testing in future.  No midline tenderness.  Radiographs ordered to assess for lower lumbar compression fracture.  Tramadol, tizanidine, tylenol, heat.  Consider physical therapy.

## 2023-06-30 ENCOUNTER — Ambulatory Visit: Payer: Medicare HMO | Admitting: Family Medicine

## 2023-06-30 VITALS — BP 128/88 | Ht 73.0 in | Wt 192.0 lb

## 2023-06-30 DIAGNOSIS — M545 Low back pain, unspecified: Secondary | ICD-10-CM | POA: Diagnosis not present

## 2023-06-30 DIAGNOSIS — M7918 Myalgia, other site: Secondary | ICD-10-CM | POA: Diagnosis not present

## 2023-06-30 DIAGNOSIS — E1165 Type 2 diabetes mellitus with hyperglycemia: Secondary | ICD-10-CM | POA: Diagnosis not present

## 2023-06-30 MED ORDER — TRAMADOL HCL 50 MG PO TABS: 50.0000 mg | ORAL_TABLET | Freq: Four times a day (QID) | ORAL | 0 refills | Status: DC | PRN

## 2023-06-30 NOTE — Progress Notes (Unsigned)
PCP: Ignatius Specking, MD  Subjective:   HPI: Patient is a 67 y.o. male here for left lower back pain 2/2 lumbar strain  At last visit was encouraged to use tramadol, tizanidine, tylenol, and heat. Radiograph of lumbar spine showed slight interval vertebral body height loss at L2 concerning for new compression fracture.  Today, reports continued pain left lateral lower back - tizanidine temporarily helps and he has been using tramadol frequently. Pain is nonradiating. No numbness/tingling in extremities. No shooting pain. No saddle anesthesia or bowel/bladder dysfunction.  No midline pain.  Past Medical History:  Diagnosis Date   Diabetes mellitus without complication (HCC)    GERD (gastroesophageal reflux disease)    Hypertension    Peripheral vascular disease (HCC)     Current Outpatient Medications on File Prior to Visit  Medication Sig Dispense Refill   aspirin EC 81 MG tablet Take 1 tablet (81 mg total) by mouth at bedtime. 30 tablet 12   clopidogrel (PLAVIX) 75 MG tablet Take 1 tablet (75 mg total) by mouth daily. 90 tablet 2   Empagliflozin-linaGLIPtin 25-5 MG TABS Take 1 tablet by mouth in the morning.     Evolocumab (REPATHA SURECLICK) 140 MG/ML SOAJ INJECT 1 PEN (140 MG) INTO THE SKIN EVERY 14 DAYS 6 mL 1   fenofibrate (TRICOR) 145 MG tablet Take 1 tablet by mouth once daily 90 tablet 3   lisinopril (PRINIVIL,ZESTRIL) 10 MG tablet Take 5 mg by mouth at bedtime.     Magnesium 400 MG CAPS Take 400 mg by mouth in the morning and at bedtime.     metoprolol succinate (TOPROL XL) 25 MG 24 hr tablet Take 1 tablet (25 mg total) by mouth daily. (Patient taking differently: Take 12.5 mg by mouth at bedtime.) 90 tablet 3   Multiple Vitamins-Minerals (MULTIVITAMIN WITH MINERALS) tablet Take 1 tablet by mouth daily.     nitroGLYCERIN (NITRODUR - DOSED IN MG/24 HR) 0.2 mg/hr patch APPLY 1/4 (ONE-FOURTH) PATCH TO AFFECTED ELBOW, CHANGE DAILY (Patient not taking: Reported on 05/07/2023) 30 patch  1   NOVOLIN 70/30 RELION (70-30) 100 UNIT/ML injection Inject 30 Units into the skin See admin instructions. Inject up to 30u under the skin twice daily, according to sliding scale     omeprazole (PRILOSEC) 20 MG capsule Take 20 mg by mouth daily before breakfast.     rosuvastatin (CRESTOR) 10 MG tablet Take 10 mg by mouth at bedtime.     tiZANidine (ZANAFLEX) 4 MG tablet Take 1 tablet (4 mg total) by mouth 3 (three) times daily as needed for muscle spasms. 30 tablet 1   traMADol (ULTRAM) 50 MG tablet Take 1 tablet (50 mg total) by mouth every 6 (six) hours as needed. 20 tablet 0   Current Facility-Administered Medications on File Prior to Visit  Medication Dose Route Frequency Provider Last Rate Last Admin   iodixanol (VISIPAQUE) 320 MG/ML injection    PRN Nada Libman, MD   120 mL at 08/20/22 1145    Past Surgical History:  Procedure Laterality Date   ABDOMINAL AORTOGRAM W/LOWER EXTREMITY Right 08/20/2022   Procedure: ABDOMINAL AORTOGRAM W/LOWER EXTREMITY;  Surgeon: Nada Libman, MD;  [facility];  Service: Cardiovascular;  Laterality: Right;   ABDOMINAL AORTOGRAM W/LOWER EXTREMITY N/A 11/19/2022   Procedure: ABDOMINAL AORTOGRAM W/LOWER EXTREMITY;  Surgeon: Nada Libman, MD;  [facility];  Service: Cardiovascular;  Laterality: N/A;   ENDARTERECTOMY FEMORAL Right 04/26/2022   Procedure: RIGHT FEMORAL  ENDARTERECTOMY;  Surgeon: Nada Libman, MD;  Location: [facility];  Service: Vascular;  Laterality: Right;   IR KYPHO LUMBAR INC FX REDUCE BONE BX UNI/BIL CANNULATION INC/IMAGING  01/13/2023   IR RADIOLOGIST EVAL & MGMT  01/29/2023   KNEE SURGERY     LOWER EXTREMITY ANGIOGRAPHY N/A 01/01/2022   Procedure: LOWER EXTREMITY ANGIOGRAPHY;  Surgeon: Elder Negus, MD;  [facility];  Service: Cardiovascular;  Laterality: N/A;   PATCH ANGIOPLASTY Right 04/26/2022   Procedure: PATCH ANGIOPLASTY OF RIGHT FEMORAL ARTERY USING XENOSURE BOVINE  PERCARDIUM PATCH;  Surgeon: Nada Libman, MD;  Location: [facility];  Service: Vascular;  Laterality: Right;   PENILE PROSTHESIS IMPLANT     PERIPHERAL INTRAVASCULAR LITHOTRIPSY Left 11/19/2022   Procedure: INTRAVASCULAR LITHOTRIPSY;  Surgeon: Nada Libman, MD;  [facility];  Service: Cardiovascular;  Laterality: Left;   PERIPHERAL VASCULAR ATHERECTOMY  08/20/2022   Procedure: PERIPHERAL VASCULAR ATHERECTOMY;  Surgeon: Nada Libman, MD;  [facility];  Service: Cardiovascular;;  popliteal   PERIPHERAL VASCULAR BALLOON ANGIOPLASTY  01/08/2022   Procedure: PERIPHERAL VASCULAR BALLOON ANGIOPLASTY;  Surgeon: Yates Decamp, MD;  [facility];  Service: Cardiovascular;;   PERIPHERAL VASCULAR INTERVENTION Left 11/19/2022   Procedure: PERIPHERAL VASCULAR INTERVENTION;  Surgeon: Nada Libman, MD;  [facility];  Service: Cardiovascular;  Laterality: Left;   WRIST SURGERY      Allergies  Allergen Reactions   Codeine Itching and Other (See Comments)    Can take/tolerate in lower doses   Keflex [Cephalexin] Other (See Comments)    "Shriveled my skin"   Penicillins Other (See Comments)    Reaction from childhood not recalled    BP 128/88   Ht 6\' 1"  (1.854 m)   Wt 192 lb (87.1 kg)   BMI 25.33 kg/m       No data to display              No data to display              Objective:  Physical Exam:  Gen: NAD, comfortable in exam room  Back:  No deformity. FROM with mild discomfort with lateral movement and extension Nontender to palpation along thoracic and lumbar vertebrae/spinous processes Tender to palpation of L lumbar paraspinal muscles, with trigger point/knot palpated Negative straight leg test bilaterally. Full active/passive ROM of hips   Assessment & Plan:  1. Left lower back pain 2/2 lumbar strain/spasm - trigger point palpated on exam @ L lower lumbar paraspinal area, discussed risks/benefits of  trigger point injection and pt elected to proceed. Advised to continue tizanidine, tramadol, heat. Consider PT if not improving. F/u as needed if improving  After informed written consent timeout was performed.  Patient was lying prone on exam table.  Area overlying left low lumbar paraspinal region prepped with alcohol swabs.  Then utilizing ultrasound guidance patient's left lumbar paraspinal trigger point injected with 5mL lidocaine.  Patient tolerated procedure well without immediate complications.

## 2023-06-30 NOTE — Patient Instructions (Signed)
You have a refill on the tizanidine. I sent in more of the tramadol to take as needed. You were given a trigger point injection today. Heat 15 minutes at a time 3-4 times a day. Consider physical therapy if you're not improving. Follow up with me as needed if you're improving.

## 2023-07-01 ENCOUNTER — Ambulatory Visit: Payer: Medicare HMO | Admitting: Cardiovascular Disease

## 2023-07-07 ENCOUNTER — Other Ambulatory Visit: Payer: Self-pay | Admitting: *Deleted

## 2023-07-07 ENCOUNTER — Ambulatory Visit: Payer: Medicare HMO | Admitting: Family Medicine

## 2023-07-07 ENCOUNTER — Other Ambulatory Visit: Payer: Self-pay | Admitting: Family Medicine

## 2023-07-07 DIAGNOSIS — M545 Low back pain, unspecified: Secondary | ICD-10-CM

## 2023-07-07 MED ORDER — HYDROCODONE-ACETAMINOPHEN 5-325 MG PO TABS: 1.0000 | ORAL_TABLET | Freq: Four times a day (QID) | ORAL | 0 refills | Status: DC | PRN

## 2023-07-07 NOTE — Progress Notes (Signed)
Patient called - injection without much benefit - was asking for another one but unlikely to be beneficial.  Will proceed with MRI lumbar spine.  Stop tramadol, try hydrocodone in addition to his muscle relaxant.  Home exercises.  Database reviewed.

## 2023-07-07 NOTE — Addendum Note (Signed)
Addended by: Annita Brod on: 07/07/2023 02:34 PM   Modules accepted: Orders

## 2023-07-07 NOTE — Addendum Note (Signed)
Addended by: Annita Brod on: 07/07/2023 02:28 PM   Modules accepted: Orders

## 2023-07-09 DIAGNOSIS — K625 Hemorrhage of anus and rectum: Secondary | ICD-10-CM | POA: Diagnosis not present

## 2023-07-09 DIAGNOSIS — K219 Gastro-esophageal reflux disease without esophagitis: Secondary | ICD-10-CM | POA: Diagnosis not present

## 2023-07-09 DIAGNOSIS — Z86010 Personal history of colonic polyps: Secondary | ICD-10-CM | POA: Diagnosis not present

## 2023-07-10 ENCOUNTER — Telehealth: Payer: Self-pay | Admitting: *Deleted

## 2023-07-10 MED ORDER — METOPROLOL SUCCINATE ER 25 MG PO TB24: 25.0000 mg | ORAL_TABLET | Freq: Every day | ORAL | Status: DC

## 2023-07-10 NOTE — Telephone Encounter (Signed)
  Patient Consent for Virtual Visit        Shawn Daniels has provided verbal consent on 07/10/2023 for a virtual visit (video or telephone).   CONSENT FOR VIRTUAL VISIT FOR:  Shawn Daniels  By participating in this virtual visit I agree to the following:  I hereby voluntarily request, consent and authorize [facility] and its employed or contracted physicians, physician assistants, nurse practitioners or other licensed health care professionals (the Practitioner), to provide me with telemedicine health care services (the "Services") as deemed necessary by the treating Practitioner. I acknowledge and consent to receive the Services by the Practitioner via telemedicine. I understand that the telemedicine visit will involve communicating with the Practitioner through live audiovisual communication technology and the disclosure of certain medical information by electronic transmission. I acknowledge that I have been given the opportunity to request an in-person assessment or other available alternative prior to the telemedicine visit and am voluntarily participating in the telemedicine visit.  I understand that I have the right to withhold or withdraw my consent to the use of telemedicine in the course of my care at any time, without affecting my right to future care or treatment, and that the Practitioner or I may terminate the telemedicine visit at any time. I understand that I have the right to inspect all information obtained and/or recorded in the course of the telemedicine visit and may receive copies of available information for a reasonable fee.  I understand that some of the potential risks of receiving the Services via telemedicine include:  Delay or interruption in medical evaluation due to technological equipment failure or disruption; Information transmitted may not be sufficient (e.g. poor resolution of images) to allow for appropriate medical decision making by the  Practitioner; and/or  In rare instances, security protocols could fail, causing a breach of personal health information.  Furthermore, I acknowledge that it is my responsibility to provide information about my medical history, conditions and care that is complete and accurate to the best of my ability. I acknowledge that Practitioner's advice, recommendations, and/or decision may be based on factors not within their control, such as incomplete or inaccurate data provided by me or distortions of diagnostic images or specimens that may result from electronic transmissions. I understand that the practice of medicine is not an exact science and that Practitioner makes no warranties or guarantees regarding treatment outcomes. I acknowledge that a copy of this consent can be made available to me via my patient portal [facility] MyChart), or I can request a printed copy by calling the office of [facility].    I understand that my insurance will be billed for this visit.   I have read or had this consent read to me. I understand the contents of this consent, which adequately explains the benefits and risks of the Services being provided via telemedicine.  I have been provided ample opportunity to ask questions regarding this consent and the Services and have had my questions answered to my satisfaction. I give my informed consent for the services to be provided through the use of telemedicine in my medical care

## 2023-07-10 NOTE — Telephone Encounter (Signed)
   Name: Shawn Daniels  DOB: 1956/08/24  MRN: [id number]  Primary Cardiologist: Kristeen Miss, MD   Preoperative team, please contact this patient and set up a phone call appointment for further preoperative risk assessment. Please obtain consent and complete medication review. Thank you for your help.  I confirm that guidance regarding antiplatelet and oral anticoagulation therapy has been completed and, if necessary, noted below.  Patient takes aspirin/Plavix for history of PAD, managed per vascular surgery.  Therefore, recommendations for holding aspirin and Plavix prior to surgery should come from managing provider (vascular surgery).   Joylene Grapes, NP 07/10/2023, 11:25 AM [facility]

## 2023-07-10 NOTE — Telephone Encounter (Signed)
   Pre-operative Risk Assessment    Patient Name: Shawn Daniels  DOB: 26-Feb-1956 MRN: [id number]      Request for Surgical Clearance    Procedure:   EGD and Colonoscopy  Date of Surgery:  Clearance 07/24/23                                 Surgeon:  Dr. Jeani Hawking Surgeon's Group or Practice Name:  [facility] Phone number:  [phone] Fax number:  [phone]   Type of Clearance Requested:   - Medical  - Pharmacy:  Hold Aspirin and Clopidogrel (Plavix) Not Indicated   Type of Anesthesia:   Propofol   Additional requests/questions:    Signed, Emmit Pomfret   07/10/2023, 9:08 AM

## 2023-07-10 NOTE — Telephone Encounter (Signed)
Patient scheduled 7-22 med  rec and consent done

## 2023-07-14 ENCOUNTER — Encounter (HOSPITAL_COMMUNITY): Payer: Medicare HMO

## 2023-07-14 ENCOUNTER — Ambulatory Visit: Payer: Medicare HMO | Admitting: Surgery

## 2023-07-14 ENCOUNTER — Ambulatory Visit: Payer: Medicare HMO | Attending: Cardiology | Admitting: Nurse Practitioner

## 2023-07-14 DIAGNOSIS — Z01810 Encounter for preprocedural cardiovascular examination: Secondary | ICD-10-CM | POA: Diagnosis not present

## 2023-07-14 NOTE — Progress Notes (Signed)
Virtual Visit via Telephone Note   Because of Shawn Daniels's co-morbid illnesses, he is at least at moderate risk for complications without adequate follow up.  This format is felt to be most appropriate for this patient at this time.  The patient did not have access to video technology/had technical difficulties with video requiring transitioning to audio format only (telephone).  All issues noted in this document were discussed and addressed.  No physical exam could be performed with this format.  Please refer to the patient's chart for his consent to telehealth for [facility].  Evaluation Performed:  Preoperative cardiovascular risk assessment _____________   Date:  07/14/2023   Patient ID:  Shawn Daniels, DOB [birth year], MRN [id number] Patient Location:  Home Provider location:   Office  Primary Care Provider:  Ignatius Specking, MD Primary Cardiologist:  Kristeen Miss, MD  Chief Complaint / Patient Profile   67 y.o. y/o male with a h/o coronary artery calcification, PAD s/p multiple interventions (PTCA), sinus tachycardia, hypertension and hyperlipidemia who is pending EGD and colonoscopy on 07/24/2023 with Dr. Jeani Hawking of [facility] and presents today for telephonic preoperative cardiovascular risk assessment.  History of Present Illness    Shawn Daniels is a 67 y.o. male who presents via audio/video conferencing for a telehealth visit today.  Pt was last seen in cardiology clinic on 05/07/2023 by Dr. Elease Hashimoto.  At that time GABRYEL FILES was doing well.  The patient is now pending procedure as outlined above. Since his last visit, he has done well from a cardiac standpoint.   He denies chest pain, palpitations, dyspnea, pnd, orthopnea, n, v, dizziness, syncope, edema, weight gain, or early satiety. All other systems reviewed and are otherwise negative except as noted above.   Past Medical History    Past Medical History:  Diagnosis Date    Diabetes mellitus without complication (HCC)    GERD (gastroesophageal reflux disease)    Hypertension    Peripheral vascular disease (HCC)    Past Surgical History:  Procedure Laterality Date   ABDOMINAL AORTOGRAM W/LOWER EXTREMITY Right 08/20/2022   Procedure: ABDOMINAL AORTOGRAM W/LOWER EXTREMITY;  Surgeon: Nada Libman, MD;  [facility];  Service: Cardiovascular;  Laterality: Right;   ABDOMINAL AORTOGRAM W/LOWER EXTREMITY N/A 11/19/2022   Procedure: ABDOMINAL AORTOGRAM W/LOWER EXTREMITY;  Surgeon: Nada Libman, MD;  [facility];  Service: Cardiovascular;  Laterality: N/A;   ENDARTERECTOMY FEMORAL Right 04/26/2022   Procedure: RIGHT FEMORAL ENDARTERECTOMY;  Surgeon: Nada Libman, MD;  Location: [facility];  Service: Vascular;  Laterality: Right;   IR KYPHO LUMBAR INC FX REDUCE BONE BX UNI/BIL CANNULATION INC/IMAGING  01/13/2023   IR RADIOLOGIST EVAL & MGMT  01/29/2023   KNEE SURGERY     LOWER EXTREMITY ANGIOGRAPHY N/A 01/01/2022   Procedure: LOWER EXTREMITY ANGIOGRAPHY;  Surgeon: Elder Negus, MD;  [facility];  Service: Cardiovascular;  Laterality: N/A;   PATCH ANGIOPLASTY Right 04/26/2022   Procedure: PATCH ANGIOPLASTY OF RIGHT FEMORAL ARTERY USING XENOSURE BOVINE PERCARDIUM PATCH;  Surgeon: Nada Libman, MD;  [facility];  Service: Vascular;  Laterality: Right;   PENILE PROSTHESIS IMPLANT     PERIPHERAL INTRAVASCULAR LITHOTRIPSY Left 11/19/2022   Procedure: INTRAVASCULAR LITHOTRIPSY;  Surgeon: Nada Libman, MD;  [facility];  Service: Cardiovascular;  Laterality: Left;   PERIPHERAL VASCULAR ATHERECTOMY  08/20/2022   Procedure: PERIPHERAL VASCULAR ATHERECTOMY;  Surgeon:  Nada Libman, MD;  [facility];  Service: Cardiovascular;;  popliteal   PERIPHERAL VASCULAR BALLOON ANGIOPLASTY  01/08/2022   Procedure: PERIPHERAL VASCULAR BALLOON ANGIOPLASTY;  Surgeon: Yates Decamp, MD;   [facility];  Service: Cardiovascular;;   PERIPHERAL VASCULAR INTERVENTION Left 11/19/2022   Procedure: PERIPHERAL VASCULAR INTERVENTION;  Surgeon: Nada Libman, MD;  [facility];  Service: Cardiovascular;  Laterality: Left;   WRIST SURGERY      Allergies  Allergies  Allergen Reactions   Codeine Itching and Other (See Comments)    Can take/tolerate in lower doses   Keflex [Cephalexin] Other (See Comments)    "Shriveled my skin"   Penicillins Other (See Comments)    Reaction from childhood not recalled    Home Medications    Prior to Admission medications   Medication Sig Start Date End Date Taking? Authorizing Provider  aspirin EC 81 MG tablet Take 1 tablet (81 mg total) by mouth at bedtime. 01/15/23   [place], Alvira Philips, DO  clopidogrel (PLAVIX) 75 MG tablet Take 1 tablet (75 mg total) by mouth daily. 01/15/23   [place], Eric J, DO  Empagliflozin-linaGLIPtin 25-5 MG TABS Take 1 tablet by mouth in the morning.    [provider]  Evolocumab (REPATHA SURECLICK) 140 MG/ML SOAJ INJECT 1 PEN (140 MG) INTO THE SKIN EVERY 14 DAYS 03/21/23   Nahser, Deloris Ping, MD  fenofibrate (TRICOR) 145 MG tablet Take 1 tablet by mouth once daily 06/10/23   Nahser, Deloris Ping, MD  HYDROcodone-acetaminophen (NORCO) 5-325 MG tablet Take 1 tablet by mouth every 6 (six) hours as needed. 07/07/23   Hudnall, Azucena Fallen, MD  lisinopril (PRINIVIL,ZESTRIL) 10 MG tablet Take 5 mg by mouth at bedtime.    [provider]  Magnesium 400 MG CAPS Take 400 mg by mouth in the morning and at bedtime.    [provider]  metoprolol succinate (TOPROL XL) 25 MG 24 hr tablet Take 1 tablet (25 mg total) by mouth daily. 07/10/23   Nahser, Deloris Ping, MD  nitroGLYCERIN (NITRODUR - DOSED IN MG/24 HR) 0.2 mg/hr patch APPLY 1/4 (ONE-FOURTH) PATCH TO AFFECTED ELBOW, CHANGE DAILY Patient not taking: Reported on 05/07/2023 09/09/22   Hudnall, Azucena Fallen, MD  NOVOLIN 70/30 RELION (70-30) 100  UNIT/ML injection Inject 30 Units into the skin See admin instructions. Inject up to 30u under the skin twice daily, according to sliding scale    [provider]  omeprazole (PRILOSEC) 20 MG capsule Take 20 mg by mouth daily before breakfast.    [provider]  rosuvastatin (CRESTOR) 10 MG tablet Take 10 mg by mouth at bedtime.    [provider]  tiZANidine (ZANAFLEX) 4 MG tablet Take 1 tablet (4 mg total) by mouth 3 (three) times daily as needed for muscle spasms. 06/19/23   Lenda Kelp, MD    Physical Exam    Vital Signs:  TEODORO JEFFREYS does not have vital signs available for review today.  Given telephonic nature of communication, physical exam is limited. AAOx3. NAD. Normal affect.  Speech and respirations are unlabored.  Accessory Clinical Findings    None  Assessment & Plan    1.  Preoperative Cardiovascular Risk Assessment:  According to the Revised Cardiac Risk Index (RCRI), his Perioperative Risk of Major Cardiac Event is (%): 0.4. His Functional Capacity in METs is: 8.97 according to the Duke Activity Status Index (DASI).Therefore, based on ACC/AHA guidelines, patient would  be at acceptable risk for the planned procedure without further cardiovascular testing.  The patient was advised that if he develops new symptoms prior to surgery to contact our office to arrange for a follow-up visit, and he verbalized understanding.  From a cardiac perspective, patient may hold aspirin and Plavix are to surgery.  However, patient takes aspirin/Plavix primarily for history of PAD, managed per vascular surgery. Therefore, recommendations for holding aspirin and Plavix prior to surgery should come from managing provider (vascular surgery).   A copy of this note will be routed to requesting surgeon.  Time:   Today, I have spent 5 minutes with the patient with telehealth technology discussing medical history, symptoms, and management plan.     Joylene Grapes, NP  07/14/2023, 3:33 PM

## 2023-07-21 ENCOUNTER — Ambulatory Visit: Payer: Medicare HMO | Admitting: Family Medicine

## 2023-07-22 ENCOUNTER — Ambulatory Visit: Payer: Medicare HMO | Attending: Family Medicine | Admitting: Physical Therapy

## 2023-07-22 DIAGNOSIS — M5459 Other low back pain: Secondary | ICD-10-CM | POA: Insufficient documentation

## 2023-07-22 DIAGNOSIS — M6281 Muscle weakness (generalized): Secondary | ICD-10-CM | POA: Insufficient documentation

## 2023-07-22 DIAGNOSIS — M62830 Muscle spasm of back: Secondary | ICD-10-CM | POA: Insufficient documentation

## 2023-07-22 DIAGNOSIS — Z9889 Other specified postprocedural states: Secondary | ICD-10-CM | POA: Insufficient documentation

## 2023-07-23 ENCOUNTER — Ambulatory Visit: Payer: Medicare HMO

## 2023-07-23 DIAGNOSIS — M62830 Muscle spasm of back: Secondary | ICD-10-CM | POA: Diagnosis not present

## 2023-07-23 DIAGNOSIS — M6281 Muscle weakness (generalized): Secondary | ICD-10-CM

## 2023-07-23 DIAGNOSIS — Z9889 Other specified postprocedural states: Secondary | ICD-10-CM | POA: Diagnosis not present

## 2023-07-23 DIAGNOSIS — M5459 Other low back pain: Secondary | ICD-10-CM

## 2023-07-23 NOTE — Therapy (Signed)
OUTPATIENT PHYSICAL THERAPY THORACOLUMBAR EVALUATION   Patient Name: Shawn Daniels MRN: [id number] DOB:February 28, 1956, 67 y.o., male Today's Date: 07/23/2023  END OF SESSION:  PT End of Session - 07/23/23 1730     Visit Number 1    Date for PT Re-Evaluation 09/17/23    PT Start Time 1018    PT Stop Time 1100    PT Time Calculation (min) 42 min    Activity Tolerance Patient tolerated treatment well    Behavior During Therapy [facility] for tasks assessed/performed             Past Medical History:  Diagnosis Date   Diabetes mellitus without complication (HCC)    GERD (gastroesophageal reflux disease)    Hypertension    Peripheral vascular disease (HCC)    Past Surgical History:  Procedure Laterality Date   ABDOMINAL AORTOGRAM W/LOWER EXTREMITY Right 08/20/2022   Procedure: ABDOMINAL AORTOGRAM W/LOWER EXTREMITY;  Surgeon: Nada Libman, MD;  [facility];  Service: Cardiovascular;  Laterality: Right;   ABDOMINAL AORTOGRAM W/LOWER EXTREMITY N/A 11/19/2022   Procedure: ABDOMINAL AORTOGRAM W/LOWER EXTREMITY;  Surgeon: Nada Libman, MD;  [facility];  Service: Cardiovascular;  Laterality: N/A;   ENDARTERECTOMY FEMORAL Right 04/26/2022   Procedure: RIGHT FEMORAL ENDARTERECTOMY;  Surgeon: Nada Libman, MD;  [facility];  Service: Vascular;  Laterality: Right;   IR KYPHO LUMBAR INC FX REDUCE BONE BX UNI/BIL CANNULATION INC/IMAGING  01/13/2023   IR RADIOLOGIST EVAL & MGMT  01/29/2023   KNEE SURGERY     LOWER EXTREMITY ANGIOGRAPHY N/A 01/01/2022   Procedure: LOWER EXTREMITY ANGIOGRAPHY;  Surgeon: Elder Negus, MD;  [facility];  Service: Cardiovascular;  Laterality: N/A;   PATCH ANGIOPLASTY Right 04/26/2022   Procedure: PATCH ANGIOPLASTY OF RIGHT FEMORAL ARTERY USING XENOSURE BOVINE PERCARDIUM PATCH;  Surgeon: Nada Libman, MD;  [facility];  Service: Vascular;  Laterality: Right;   PENILE PROSTHESIS IMPLANT      PERIPHERAL INTRAVASCULAR LITHOTRIPSY Left 11/19/2022   Procedure: INTRAVASCULAR LITHOTRIPSY;  Surgeon: Nada Libman, MD;  [facility];  Service: Cardiovascular;  Laterality: Left;   PERIPHERAL VASCULAR ATHERECTOMY  08/20/2022   Procedure: PERIPHERAL VASCULAR ATHERECTOMY;  Surgeon: Nada Libman, MD;  [facility];  Service: Cardiovascular;;  popliteal   PERIPHERAL VASCULAR BALLOON ANGIOPLASTY  01/08/2022   Procedure: PERIPHERAL VASCULAR BALLOON ANGIOPLASTY;  Surgeon: Yates Decamp, MD;  [facility];  Service: Cardiovascular;;   PERIPHERAL VASCULAR INTERVENTION Left 11/19/2022   Procedure: PERIPHERAL VASCULAR INTERVENTION;  Surgeon: Nada Libman, MD;  [facility];  Service: Cardiovascular;  Laterality: Left;   WRIST SURGERY     Patient Active Problem List   Diagnosis Date Noted   Shock circulatory (HCC) 02/06/2023   Lumbar compression fracture, closed, initial encounter (HCC) 01/05/2023   Alcohol dependence (HCC) 01/05/2023   Sinus tachycardia 10/08/2022   PAD (peripheral artery disease) (HCC) 04/26/2022   Hypertension 04/25/2022   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) 04/25/2022   HLD (hyperlipidemia) 04/03/2022   Claudication in peripheral vascular disease (HCC) 12/31/2021   Extensor tendon disruption 06/19/2020   Traumatic tear of supraspinatus tendon, right, initial encounter 12/02/2018   Rib contusion, right, initial encounter 12/02/2018   Trigger finger, acquired 06/29/2018   Mass of finger of left hand 11/01/2017   Closed fracture of first metacarpal bone of left hand with routine healing, subsequent encounter 09/21/2017  PCP: Doreen Beam  REFERRING PROVIDER: Norton Blizzard, MD  REFERRING DIAG: L sided lumbar radiculopathy  Rationale for Evaluation and Treatment: Rehabilitation  THERAPY DIAG:  Muscle weakness (generalized)  S/P kyphoplasty  Muscle spasm of back  Other low back  pain  ONSET DATE: 5 weeks ago  SUBJECTIVE:                                                                                                                                                                                           SUBJECTIVE STATEMENT: I feel like the pain is better, but really sore, muscle soreness   PERTINENT HISTORY:  L 1 compression fx with kyphoplasty in January, recovered completely  PAIN:  Are you having pain? Yes: NPRS scale: 3/10 Pain location: midline lumbar  Pain description: soreness, aching Aggravating factors: sitting or standing too long, being still  Relieving factors: light stretching  PRECAUTIONS: None  RED FLAGS: None   WEIGHT BEARING RESTRICTIONS: No  FALLS:  Has patient fallen in last 6 months? No  LIVING ENVIRONMENT: Lives with: lives alone Lives in: House/apartment Stairs: No Has following equipment at home: None  OCCUPATION: driving trucks delivering car parts  PLOF: Independent  PATIENT GOALS: get rid of this pain and be able to function and work  NEXT MD VISIT:   OBJECTIVE:   DIAGNOSTIC FINDINGS:  Radiographs lumbar :  IMPRESSION: 1. Slight interval vertebral body height loss at L2, concerning for a new compression fracture of indeterminate acuity. 2. Redemonstrated compression deformity of L1, now status post kyphoplasty.     Electronically Signed   By: Wiliam Ke M.D.   On: 06/19/2023 11:13      Res   PATIENT SURVEYS:  Modified Oswestry 17/50 34%   SCREENING FOR RED FLAGS: Bowel or bladder incontinence: No Spinal tumors: No Cauda equina syndrome: No Compression fracture: Yes: old L1 com fx from fall in jan healed, ? New comp fx L2 Abdominal aneurysm: No  COGNITION: Overall cognitive status: Within functional limits for tasks assessed     SENSATION: WFL   POSTURE: rounded shoulders, decreased lumbar lordosis, increased thoracic kyphosis, posterior pelvic tilt, and flexed trunk    PALPATION: Point tender B lower lumbar multifidi, also  Segmental glides lower thoracic and lumbar spine non painful  LUMBAR ROM:   AROM eval  Flexion Fingertips to floor 6"  Extension 30p!  Right lateral flexion 60  Left lateral flexion 60  Right rotation nt  Left rotation nt   (Blank rows = not tested)  LOWER EXTREMITY ROM:   all Wnl    LOWER EXTREMITY MMT:  all burst testing wnl, able  to heel and toe walk I    LUMBAR SPECIAL TESTS:  Straight leg raise test: Negative, Single leg stance test: Negative, and FABER test: Negative  FUNCTIONAL TESTS:  5 times sit to stand: 12  GAIT: Distance walked: over 100 ' in clinic   TODAY'S TREATMENT:                                                                                                                              DATE: 07/23/23:  Trigger Point Dry-Needling  Treatment instructions: Expect mild to moderate muscle soreness. S/S of pneumothorax if dry needled over a lung field, and to seek immediate medical attention should they occur. Patient verbalized understanding of these instructions and education. Patient Consent Given: Yes Education handout provided: No Muscles treated: L4, L5 multifidus, iliocostalisR, multifidus L    Treatment response/outcome: Twitch Response Elicited   Educated in seated theraband hip and ER to fire multifidus, to utilize in truck with sitting   PATIENT EDUCATION:  Education details: POC goals Person educated: Patient Education method: Programmer, multimedia, Facilities manager, and Actor cues Education comprehension: verbalized understanding, returned demonstration, and verbal cues required  HOME EXERCISE PROGRAM: TBD  ASSESSMENT:  CLINICAL IMPRESSION: Patient is a 67 y.o. male who was seen today for physical therapy evaluation and treatment for strain LS spine. Injury occurred when lifting something at work from the ground. Pain is mid line lumbar region, tender along B paraspinals, multifidi. He  has normal strength B LE's and primary limitations with lumbar motion is in extension, also noted a rounded posture with post pelvic tilt, loss of lumbar lordosis. Pain appears to be muscular in nature. Shoulder benefit from skilled PT to address his goals, funciton  OBJECTIVE IMPAIRMENTS: decreased activity tolerance, decreased ROM, decreased strength, hypomobility, impaired flexibility, improper body mechanics, postural dysfunction, and pain.   ACTIVITY LIMITATIONS: lifting, sitting, and sleeping  PARTICIPATION LIMITATIONS: community activity and occupation  PERSONAL FACTORS: Behavior pattern, Past/current experiences, Time since onset of injury/illness/exacerbation, and 1 comorbidity: previous L1 compression fx  are also affecting patient's functional outcome.   REHAB POTENTIAL: Good  CLINICAL DECISION MAKING: Stable/uncomplicated  EVALUATION COMPLEXITY: Low   GOALS: Goals reviewed with patient? Yes  SHORT TERM GOALS: Target date: 2 weeks 08/06/23  I HEP Baseline: Goal status: INITIAL   LONG TERM GOALS: Target date: 09/16/23  Modified oswestry 10/50 Baseline: 17/50 Goal status: INITIAL  2.  Improve ROM LS for extension from 30 to 70 degrees without pain Baseline:  Goal status: INITIAL  3.  Resolve pain with palpation lower lumbar paraspinals Baseline: very tender/painful Goal status: INITIAL   PLAN:  PT FREQUENCY: 1-2x/week  PT DURATION: 8 weeks  PLANNED INTERVENTIONS: Therapeutic exercises, Therapeutic activity, Neuromuscular re-education, Balance training, Gait training, Patient/Family education, Self Care, and Joint mobilization.  PLAN FOR NEXT SESSION: how was dry needling, advance with LS progressive extension ex, stabilization, cardiovascular, other modalities as needed   Early Chars, PT DPT [facility] 07/23/2023, 5:36  PM

## 2023-07-28 ENCOUNTER — Ambulatory Visit: Payer: Medicare HMO | Attending: Family Medicine | Admitting: Physical Therapy

## 2023-07-28 ENCOUNTER — Encounter: Payer: Self-pay | Admitting: Physical Therapy

## 2023-07-28 DIAGNOSIS — M62830 Muscle spasm of back: Secondary | ICD-10-CM | POA: Diagnosis not present

## 2023-07-28 DIAGNOSIS — M5459 Other low back pain: Secondary | ICD-10-CM | POA: Diagnosis not present

## 2023-07-28 DIAGNOSIS — M6281 Muscle weakness (generalized): Secondary | ICD-10-CM | POA: Diagnosis not present

## 2023-07-28 DIAGNOSIS — Z9889 Other specified postprocedural states: Secondary | ICD-10-CM | POA: Diagnosis not present

## 2023-07-28 NOTE — Therapy (Signed)
OUTPATIENT PHYSICAL THERAPY THORACOLUMBAR TREATMENT   Patient Name: Shawn Daniels MRN: [id number] DOB:1956-01-31, 67 y.o., male Today's Date: 07/28/2023  END OF SESSION:  PT End of Session - 07/28/23 1518     Visit Number 2    Date for PT Re-Evaluation 09/17/23    PT Start Time 1515    PT Stop Time 1600    PT Time Calculation (min) 45 min    Activity Tolerance Patient tolerated treatment well    Behavior During Therapy [facility] for tasks assessed/performed             Past Medical History:  Diagnosis Date   Diabetes mellitus without complication (HCC)    GERD (gastroesophageal reflux disease)    Hypertension    Peripheral vascular disease (HCC)    Past Surgical History:  Procedure Laterality Date   ABDOMINAL AORTOGRAM W/LOWER EXTREMITY Right 08/20/2022   Procedure: ABDOMINAL AORTOGRAM W/LOWER EXTREMITY;  Surgeon: Nada Libman, MD;  [facility]: [facility];  Service: Cardiovascular;  Laterality: Right;   ABDOMINAL AORTOGRAM W/LOWER EXTREMITY N/A 11/19/2022   Procedure: ABDOMINAL AORTOGRAM W/LOWER EXTREMITY;  Surgeon: Nada Libman, MD;  [facility];  Service: Cardiovascular;  Laterality: N/A;   ENDARTERECTOMY FEMORAL Right 04/26/2022   Procedure: RIGHT FEMORAL ENDARTERECTOMY;  Surgeon: Nada Libman, MD;  Location: [facility];  Service: Vascular;  Laterality: Right;   IR KYPHO LUMBAR INC FX REDUCE BONE BX UNI/BIL CANNULATION INC/IMAGING  01/13/2023   IR RADIOLOGIST EVAL & MGMT  01/29/2023   KNEE SURGERY     LOWER EXTREMITY ANGIOGRAPHY N/A 01/01/2022   Procedure: LOWER EXTREMITY ANGIOGRAPHY;  Surgeon: Elder Negus, MD;  [facility];  Service: Cardiovascular;  Laterality: N/A;   PATCH ANGIOPLASTY Right 04/26/2022   Procedure: PATCH ANGIOPLASTY OF RIGHT FEMORAL ARTERY USING XENOSURE BOVINE PERCARDIUM PATCH;  Surgeon: Nada Libman, MD;  [facility]: [facility];  Service: Vascular;  Laterality: Right;   PENILE PROSTHESIS IMPLANT      PERIPHERAL INTRAVASCULAR LITHOTRIPSY Left 11/19/2022   Procedure: INTRAVASCULAR LITHOTRIPSY;  Surgeon: Nada Libman, MD;  [facility];  Service: Cardiovascular;  Laterality: Left;   PERIPHERAL VASCULAR ATHERECTOMY  08/20/2022   Procedure: PERIPHERAL VASCULAR ATHERECTOMY;  Surgeon: Nada Libman, MD;  [facility];  Service: Cardiovascular;;  popliteal   PERIPHERAL VASCULAR BALLOON ANGIOPLASTY  01/08/2022   Procedure: PERIPHERAL VASCULAR BALLOON ANGIOPLASTY;  Surgeon: Yates Decamp, MD;  [facility];  Service: Cardiovascular;;   PERIPHERAL VASCULAR INTERVENTION Left 11/19/2022   Procedure: PERIPHERAL VASCULAR INTERVENTION;  Surgeon: Nada Libman, MD;  [facility];  Service: Cardiovascular;  Laterality: Left;   WRIST SURGERY     Patient Active Problem List   Diagnosis Date Noted   Shock circulatory (HCC) 02/06/2023   Lumbar compression fracture, closed, initial encounter (HCC) 01/05/2023   Alcohol dependence (HCC) 01/05/2023   Sinus tachycardia 10/08/2022   PAD (peripheral artery disease) (HCC) 04/26/2022   Hypertension 04/25/2022   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) 04/25/2022   HLD (hyperlipidemia) 04/03/2022   Claudication in peripheral vascular disease (HCC) 12/31/2021   Extensor tendon disruption 06/19/2020   Traumatic tear of supraspinatus tendon, right, initial encounter 12/02/2018   Rib contusion, right, initial encounter 12/02/2018   Trigger finger, acquired 06/29/2018   Mass of finger of left hand 11/01/2017   Closed fracture of first metacarpal bone of left hand with routine healing, subsequent encounter 09/21/2017  PCP: Doreen Beam  REFERRING PROVIDER: Norton Blizzard, MD  REFERRING DIAG: L sided lumbar radiculopathy  Rationale for Evaluation and Treatment: Rehabilitation  THERAPY DIAG:  Muscle weakness (generalized)  S/P kyphoplasty  Muscle spasm of back  Other low back  pain  ONSET DATE: 5 weeks ago  SUBJECTIVE:                                                                                                                                                                                           SUBJECTIVE STATEMENT: "All right" pain has subsided just tight new  PERTINENT HISTORY:  L 1 compression fx with kyphoplasty in January, recovered completely  PAIN:  re you having pain? Yes: NPRS scale: 7.5/10 Pain location: midline lumbar  Pain description: soreness, tight Aggravating factors: sitting or standing too long, being still  Relieving factors: light stretching  PRECAUTIONS: None  RED FLAGS: None   WEIGHT BEARING RESTRICTIONS: No  FALLS:  Has patient fallen in last 6 months? No  LIVING ENVIRONMENT: Lives with: lives alone Lives in: House/apartment Stairs: No Has following equipment at home: None  OCCUPATION: driving trucks delivering car parts  PLOF: Independent  PATIENT GOALS: get rid of this pain and be able to function and work  NEXT MD VISIT:   OBJECTIVE:   DIAGNOSTIC FINDINGS:  Radiographs lumbar :  IMPRESSION: 1. Slight interval vertebral body height loss at L2, concerning for a new compression fracture of indeterminate acuity. 2. Redemonstrated compression deformity of L1, now status post kyphoplasty.     Electronically Signed   By: Wiliam Ke M.D.   On: 06/19/2023 11:13      Res   PATIENT SURVEYS:  Modified Oswestry 17/50 34%   SCREENING FOR RED FLAGS: Bowel or bladder incontinence: No Spinal tumors: No Cauda equina syndrome: No Compression fracture: Yes: old L1 com fx from fall in jan healed, ? New comp fx L2 Abdominal aneurysm: No  COGNITION: Overall cognitive status: Within functional limits for tasks assessed     SENSATION: WFL   POSTURE: rounded shoulders, decreased lumbar lordosis, increased thoracic kyphosis, posterior pelvic tilt, and flexed trunk   PALPATION: Point tender B lower  lumbar multifidi, also  Segmental glides lower thoracic and lumbar spine non painful  LUMBAR ROM:   AROM eval  Flexion Fingertips to floor 6"  Extension 30p!  Right lateral flexion 60  Left lateral flexion 60  Right rotation nt  Left rotation nt   (Blank rows = not tested)  LOWER EXTREMITY ROM:   all Wnl    LOWER EXTREMITY MMT:  all burst testing wnl, able to heel and toe walk  I    LUMBAR SPECIAL TESTS:  Straight leg raise test: Negative, Single leg stance test: Negative, and FABER test: Negative  FUNCTIONAL TESTS:  5 times sit to stand: 12  GAIT: Distance walked: over 100 ' in clinic   TODAY'S TREATMENT:                                                                                                                              DATE:  07/28/23 NuStep L5 x 5 min STM to lumbar para spinales S2S OHP yellow ball 2x10 AR Press 15lb x10 each  Shoulder Ext 10lb 2x15 HS curls 35lb 2x12 Leg Ext 10lb 2x12 Supine bridges x10 LE on Pball bridges, K2C, Oblq HS, piriformis, and K2C stretch  07/23/23:  Trigger Point Dry-Needling  Treatment instructions: Expect mild to moderate muscle soreness. S/S of pneumothorax if dry needled over a lung field, and to seek immediate medical attention should they occur. Patient verbalized understanding of these instructions and education. Patient Consent Given: Yes Education handout provided: No Muscles treated: L4, L5 multifidus, iliocostalisR, multifidus L    Treatment response/outcome: Twitch Response Elicited   Educated in seated theraband hip and ER to fire multifidus, to utilize in truck with sitting   PATIENT EDUCATION:  Education details: POC goals Person educated: Patient Education method: Programmer, multimedia, Facilities manager, and Actor cues Education comprehension: verbalized understanding, returned demonstration, and verbal cues required  HOME EXERCISE PROGRAM: TBD  ASSESSMENT:  CLINICAL IMPRESSION: Patient is a 67 y.o. male who  was seen today for physical therapy treatment for strain LS spine. Injury occurred when lifting something at work from the ground. Pt enters with no pain only tightness.  He has a rounded posture with post pelvic tilt, loss of lumbar lordosis at rest. Pain appears to be muscular in nature. Increase tissue density noted with STM. Postural cues needed with shoulder extensions. Cues to increase lumbar ext with OHP. Positive response to lumbar rotation Le on Pball.Shoulder benefit from skilled PT to address his goals, funciton  OBJECTIVE IMPAIRMENTS: decreased activity tolerance, decreased ROM, decreased strength, hypomobility, impaired flexibility, improper body mechanics, postural dysfunction, and pain.   ACTIVITY LIMITATIONS: lifting, sitting, and sleeping  PARTICIPATION LIMITATIONS: community activity and occupation  PERSONAL FACTORS: Behavior pattern, Past/current experiences, Time since onset of injury/illness/exacerbation, and 1 comorbidity: previous L1 compression fx  are also affecting patient's functional outcome.   REHAB POTENTIAL: Good  CLINICAL DECISION MAKING: Stable/uncomplicated  EVALUATION COMPLEXITY: Low   GOALS: Goals reviewed with patient? Yes  SHORT TERM GOALS: Target date: 2 weeks 08/06/23  I HEP Baseline: Goal status: INITIAL   LONG TERM GOALS: Target date: 09/16/23  Modified oswestry 10/50 Baseline: 17/50 Goal status: INITIAL  2.  Improve ROM LS for extension from 30 to 70 degrees without pain Baseline:  Goal status: INITIAL  3.  Resolve pain with palpation lower lumbar paraspinals Baseline: very tender/painful Goal status: INITIAL   PLAN:  PT FREQUENCY: 1-2x/week  PT DURATION:  8 weeks  PLANNED INTERVENTIONS: Therapeutic exercises, Therapeutic activity, Neuromuscular re-education, Balance training, Gait training, Patient/Family education, Self Care, and Joint mobilization.  PLAN FOR NEXT SESSION: how was dry needling, advance with LS progressive  extension ex, stabilization, cardiovascular, other modalities as needed   Grayce Sessions, PTA DPT [facility] 07/28/2023, 3:19 PM

## 2023-07-30 DIAGNOSIS — E1159 Type 2 diabetes mellitus with other circulatory complications: Secondary | ICD-10-CM | POA: Diagnosis not present

## 2023-07-30 DIAGNOSIS — I152 Hypertension secondary to endocrine disorders: Secondary | ICD-10-CM | POA: Diagnosis not present

## 2023-07-30 DIAGNOSIS — Z299 Encounter for prophylactic measures, unspecified: Secondary | ICD-10-CM | POA: Diagnosis not present

## 2023-07-30 DIAGNOSIS — I10 Essential (primary) hypertension: Secondary | ICD-10-CM | POA: Diagnosis not present

## 2023-07-31 ENCOUNTER — Ambulatory Visit: Payer: Medicare HMO

## 2023-07-31 DIAGNOSIS — E1165 Type 2 diabetes mellitus with hyperglycemia: Secondary | ICD-10-CM | POA: Diagnosis not present

## 2023-08-04 ENCOUNTER — Ambulatory Visit: Payer: Medicare HMO

## 2023-08-04 DIAGNOSIS — M62830 Muscle spasm of back: Secondary | ICD-10-CM

## 2023-08-04 DIAGNOSIS — Z9889 Other specified postprocedural states: Secondary | ICD-10-CM | POA: Diagnosis not present

## 2023-08-04 DIAGNOSIS — M5459 Other low back pain: Secondary | ICD-10-CM

## 2023-08-04 DIAGNOSIS — M6281 Muscle weakness (generalized): Secondary | ICD-10-CM

## 2023-08-04 NOTE — Therapy (Signed)
OUTPATIENT PHYSICAL THERAPY THORACOLUMBAR TREATMENT   Patient Name: Shawn Daniels MRN: [id number] DOB:1956/09/12, 67 y.o., male Today's Date: 08/04/2023  END OF SESSION:  PT End of Session - 08/04/23 1528     Visit Number 3    Date for PT Re-Evaluation 09/17/23    PT Start Time 1530    PT Stop Time 1615    PT Time Calculation (min) 45 min    Activity Tolerance Patient tolerated treatment well    Behavior During Therapy [facility] for tasks assessed/performed              Past Medical History:  Diagnosis Date   Diabetes mellitus without complication (HCC)    GERD (gastroesophageal reflux disease)    Hypertension    Peripheral vascular disease (HCC)    Past Surgical History:  Procedure Laterality Date   ABDOMINAL AORTOGRAM W/LOWER EXTREMITY Right 08/20/2022   Procedure: ABDOMINAL AORTOGRAM W/LOWER EXTREMITY;  Surgeon: Nada Libman, MD;  [facility];  Service: Cardiovascular;  Laterality: Right;   ABDOMINAL AORTOGRAM W/LOWER EXTREMITY N/A 11/19/2022   Procedure: ABDOMINAL AORTOGRAM W/LOWER EXTREMITY;  Surgeon: Nada Libman, MD;  [facility];  Service: Cardiovascular;  Laterality: N/A;   ENDARTERECTOMY FEMORAL Right 04/26/2022   Procedure: RIGHT FEMORAL ENDARTERECTOMY;  Surgeon: Nada Libman, MD;  [facility];  Service: Vascular;  Laterality: Right;   IR KYPHO LUMBAR INC FX REDUCE BONE BX UNI/BIL CANNULATION INC/IMAGING  01/13/2023   IR RADIOLOGIST EVAL & MGMT  01/29/2023   KNEE SURGERY     LOWER EXTREMITY ANGIOGRAPHY N/A 01/01/2022   Procedure: LOWER EXTREMITY ANGIOGRAPHY;  Surgeon: Elder Negus, MD;  [facility];  Service: Cardiovascular;  Laterality: N/A;   PATCH ANGIOPLASTY Right 04/26/2022   Procedure: PATCH ANGIOPLASTY OF RIGHT FEMORAL ARTERY USING XENOSURE BOVINE PERCARDIUM PATCH;  Surgeon: Nada Libman, MD;  [facility];  Service: Vascular;  Laterality: Right;   PENILE PROSTHESIS IMPLANT      PERIPHERAL INTRAVASCULAR LITHOTRIPSY Left 11/19/2022   Procedure: INTRAVASCULAR LITHOTRIPSY;  Surgeon: Nada Libman, MD;  [facility];  Service: Cardiovascular;  Laterality: Left;   PERIPHERAL VASCULAR ATHERECTOMY  08/20/2022   Procedure: PERIPHERAL VASCULAR ATHERECTOMY;  Surgeon: Nada Libman, MD;  [facility];  Service: Cardiovascular;;  popliteal   PERIPHERAL VASCULAR BALLOON ANGIOPLASTY  01/08/2022   Procedure: PERIPHERAL VASCULAR BALLOON ANGIOPLASTY;  Surgeon: Yates Decamp, MD;  [facility];  Service: Cardiovascular;;   PERIPHERAL VASCULAR INTERVENTION Left 11/19/2022   Procedure: PERIPHERAL VASCULAR INTERVENTION;  Surgeon: Nada Libman, MD;  [facility];  Service: Cardiovascular;  Laterality: Left;   WRIST SURGERY     Patient Active Problem List   Diagnosis Date Noted   Shock circulatory (HCC) 02/06/2023   Lumbar compression fracture, closed, initial encounter (HCC) 01/05/2023   Alcohol dependence (HCC) 01/05/2023   Sinus tachycardia 10/08/2022   PAD (peripheral artery disease) (HCC) 04/26/2022   Hypertension 04/25/2022   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) 04/25/2022   HLD (hyperlipidemia) 04/03/2022   Claudication in peripheral vascular disease (HCC) 12/31/2021   Extensor tendon disruption 06/19/2020   Traumatic tear of supraspinatus tendon, right, initial encounter 12/02/2018   Rib contusion, right, initial encounter 12/02/2018   Trigger finger, acquired 06/29/2018   Mass of finger of left hand 11/01/2017   Closed fracture of first metacarpal bone of left hand with routine healing, subsequent encounter 09/21/2017  PCP: Doreen Beam  REFERRING PROVIDER: Norton Blizzard, MD  REFERRING DIAG: L sided lumbar radiculopathy  Rationale for Evaluation and Treatment: Rehabilitation  THERAPY DIAG:  Muscle spasm of back  Other low back pain  Muscle weakness (generalized)  ONSET DATE: 5  weeks ago  SUBJECTIVE:                                                                                                                                                                                           SUBJECTIVE STATEMENT: Not in pain but sore and I am tight.   PERTINENT HISTORY:  L 1 compression fx with kyphoplasty in January, recovered completely  PAIN:  re you having pain? Yes: NPRS scale: 0/10 Pain location: midline lumbar  Pain description: soreness, tight Aggravating factors: sitting or standing too long, being still  Relieving factors: light stretching  PRECAUTIONS: None  RED FLAGS: None   WEIGHT BEARING RESTRICTIONS: No  FALLS:  Has patient fallen in last 6 months? No  LIVING ENVIRONMENT: Lives with: lives alone Lives in: House/apartment Stairs: No Has following equipment at home: None  OCCUPATION: driving trucks delivering car parts  PLOF: Independent  PATIENT GOALS: get rid of this pain and be able to function and work  NEXT MD VISIT:   OBJECTIVE:   DIAGNOSTIC FINDINGS:  Radiographs lumbar :  IMPRESSION: 1. Slight interval vertebral body height loss at L2, concerning for a new compression fracture of indeterminate acuity. 2. Redemonstrated compression deformity of L1, now status post kyphoplasty.     Electronically Signed   By: Wiliam Ke M.D.   On: 06/19/2023 11:13      Res   PATIENT SURVEYS:  Modified Oswestry 17/50 34%   SCREENING FOR RED FLAGS: Bowel or bladder incontinence: No Spinal tumors: No Cauda equina syndrome: No Compression fracture: Yes: old L1 com fx from fall in jan healed, ? New comp fx L2 Abdominal aneurysm: No  COGNITION: Overall cognitive status: Within functional limits for tasks assessed     SENSATION: WFL   POSTURE: rounded shoulders, decreased lumbar lordosis, increased thoracic kyphosis, posterior pelvic tilt, and flexed trunk   PALPATION: Point tender B lower lumbar multifidi, also   Segmental glides lower thoracic and lumbar spine non painful  LUMBAR ROM:   AROM eval  Flexion Fingertips to floor 6"  Extension 30p!  Right lateral flexion 60  Left lateral flexion 60  Right rotation nt  Left rotation nt   (Blank rows = not tested)  LOWER EXTREMITY ROM:   all Wnl    LOWER EXTREMITY MMT:  all burst testing wnl, able to heel and toe walk I  LUMBAR SPECIAL TESTS:  Straight leg raise test: Negative, Single leg stance test: Negative, and FABER test: Negative  FUNCTIONAL TESTS:  5 times sit to stand: 12  GAIT: Distance walked: over 100 ' in clinic   TODAY'S TREATMENT:                                                                                                                              DATE:  08/04/23 NuStep L5 x64mins  On MH, LE stretches and feet on pball rotations, K2C, small bridges STM to lumbar paraspinals  Seated rows 45# 2x10 Lat pulls down 35# 2x10 BlackTB ext 2x10 Shoulder ext 10# 2x10    07/28/23 NuStep L5 x 5 min STM to lumbar para spinales S2S OHP yellow ball 2x10 AR Press 15lb x10 each  Shoulder Ext 10lb 2x15 HS curls 35lb 2x12 Leg Ext 10lb 2x12 Supine bridges x10 LE on Pball bridges, K2C, Oblq HS, piriformis, and K2C stretch  07/23/23:  Trigger Point Dry-Needling  Treatment instructions: Expect mild to moderate muscle soreness. S/S of pneumothorax if dry needled over a lung field, and to seek immediate medical attention should they occur. Patient verbalized understanding of these instructions and education. Patient Consent Given: Yes Education handout provided: No Muscles treated: L4, L5 multifidus, iliocostalisR, multifidus L    Treatment response/outcome: Twitch Response Elicited   Educated in seated theraband hip and ER to fire multifidus, to utilize in truck with sitting   PATIENT EDUCATION:  Education details: POC goals Person educated: Patient Education method: Programmer, multimedia, Facilities manager, and Actor  cues Education comprehension: verbalized understanding, returned demonstration, and verbal cues required  HOME EXERCISE PROGRAM: TBD  ASSESSMENT:  CLINICAL IMPRESSION: Patient is a 67 y.o. male who was seen today for physical therapy treatment for strain LS spine. Injury occurred when lifting something at work from the ground. Pt enters with no pain only tightness.  He has a rounded posture with post pelvic tilt, loss of lumbar lordosis at rest. Pain appears to be muscular in nature. Increase tissue density noted with STM. Postural cues needed with shoulder extensions. Cues to increase lumbar ext with OHP. Positive response to lumbar rotation Le on Pball.Shoulder benefit from skilled PT to address his goals, funciton  OBJECTIVE IMPAIRMENTS: decreased activity tolerance, decreased ROM, decreased strength, hypomobility, impaired flexibility, improper body mechanics, postural dysfunction, and pain.   ACTIVITY LIMITATIONS: lifting, sitting, and sleeping  PARTICIPATION LIMITATIONS: community activity and occupation  PERSONAL FACTORS: Behavior pattern, Past/current experiences, Time since onset of injury/illness/exacerbation, and 1 comorbidity: previous L1 compression fx  are also affecting patient's functional outcome.   REHAB POTENTIAL: Good  CLINICAL DECISION MAKING: Stable/uncomplicated  EVALUATION COMPLEXITY: Low   GOALS: Goals reviewed with patient? Yes  SHORT TERM GOALS: Target date: 2 weeks 08/06/23  I HEP Baseline: Goal status: INITIAL   LONG TERM GOALS: Target date: 09/16/23  Modified oswestry 10/50 Baseline: 17/50 Goal status: INITIAL  2.  Improve ROM LS for extension from  30 to 70 degrees without pain Baseline:  Goal status: INITIAL  3.  Resolve pain with palpation lower lumbar paraspinals Baseline: very tender/painful Goal status: INITIAL   PLAN:  PT FREQUENCY: 1-2x/week  PT DURATION: 8 weeks  PLANNED INTERVENTIONS: Therapeutic exercises, Therapeutic  activity, Neuromuscular re-education, Balance training, Gait training, Patient/Family education, Self Care, and Joint mobilization.  PLAN FOR NEXT SESSION: how was dry needling, advance with LS progressive extension ex, stabilization, cardiovascular, other modalities as needed   Cassie Freer, PT 08/04/2023, 4:11 PM

## 2023-08-07 ENCOUNTER — Encounter: Payer: Self-pay | Admitting: Physical Therapy

## 2023-08-07 ENCOUNTER — Ambulatory Visit: Payer: Medicare HMO | Admitting: Physical Therapy

## 2023-08-07 DIAGNOSIS — M6281 Muscle weakness (generalized): Secondary | ICD-10-CM | POA: Diagnosis not present

## 2023-08-07 DIAGNOSIS — M62830 Muscle spasm of back: Secondary | ICD-10-CM | POA: Diagnosis not present

## 2023-08-07 DIAGNOSIS — M5459 Other low back pain: Secondary | ICD-10-CM

## 2023-08-07 DIAGNOSIS — Z9889 Other specified postprocedural states: Secondary | ICD-10-CM | POA: Diagnosis not present

## 2023-08-07 NOTE — Therapy (Signed)
` OUTPATIENT PHYSICAL THERAPY THORACOLUMBAR TREATMENT   Patient Name: Shawn Daniels MRN: [id number] DOB:1956/02/26, 67 y.o., male Today's Date: 08/07/2023  END OF SESSION:  PT End of Session - 08/07/23 1515     Visit Number 4    Date for PT Re-Evaluation 09/17/23    PT Start Time 1515    PT Stop Time 1600    PT Time Calculation (min) 45 min    Activity Tolerance Patient tolerated treatment well    Behavior During Therapy [facility] for tasks assessed/performed              Past Medical History:  Diagnosis Date   Diabetes mellitus without complication (HCC)    GERD (gastroesophageal reflux disease)    Hypertension    Peripheral vascular disease (HCC)    Past Surgical History:  Procedure Laterality Date   ABDOMINAL AORTOGRAM W/LOWER EXTREMITY Right 08/20/2022   Procedure: ABDOMINAL AORTOGRAM W/LOWER EXTREMITY;  Surgeon: Nada Libman, MD;  [facility]: [facility];  Service: Cardiovascular;  Laterality: Right;   ABDOMINAL AORTOGRAM W/LOWER EXTREMITY N/A 11/19/2022   Procedure: ABDOMINAL AORTOGRAM W/LOWER EXTREMITY;  Surgeon: Nada Libman, MD;  [facility]: [facility];  Service: Cardiovascular;  Laterality: N/A;   ENDARTERECTOMY FEMORAL Right 04/26/2022   Procedure: RIGHT FEMORAL ENDARTERECTOMY;  Surgeon: Nada Libman, MD;  Location: [facility];  Service: Vascular;  Laterality: Right;   IR KYPHO LUMBAR INC FX REDUCE BONE BX UNI/BIL CANNULATION INC/IMAGING  01/13/2023   IR RADIOLOGIST EVAL & MGMT  01/29/2023   KNEE SURGERY     LOWER EXTREMITY ANGIOGRAPHY N/A 01/01/2022   Procedure: LOWER EXTREMITY ANGIOGRAPHY;  Surgeon: Elder Negus, MD;  [facility];  Service: Cardiovascular;  Laterality: N/A;   PATCH ANGIOPLASTY Right 04/26/2022   Procedure: PATCH ANGIOPLASTY OF RIGHT FEMORAL ARTERY USING XENOSURE BOVINE PERCARDIUM PATCH;  Surgeon: Nada Libman, MD;  [facility]: [facility];  Service: Vascular;  Laterality: Right;   PENILE PROSTHESIS IMPLANT      PERIPHERAL INTRAVASCULAR LITHOTRIPSY Left 11/19/2022   Procedure: INTRAVASCULAR LITHOTRIPSY;  Surgeon: Nada Libman, MD;  [facility];  Service: Cardiovascular;  Laterality: Left;   PERIPHERAL VASCULAR ATHERECTOMY  08/20/2022   Procedure: PERIPHERAL VASCULAR ATHERECTOMY;  Surgeon: Nada Libman, MD;  [facility];  Service: Cardiovascular;;  popliteal   PERIPHERAL VASCULAR BALLOON ANGIOPLASTY  01/08/2022   Procedure: PERIPHERAL VASCULAR BALLOON ANGIOPLASTY;  Surgeon: Yates Decamp, MD;  [facility];  Service: Cardiovascular;;   PERIPHERAL VASCULAR INTERVENTION Left 11/19/2022   Procedure: PERIPHERAL VASCULAR INTERVENTION;  Surgeon: Nada Libman, MD;  [facility];  Service: Cardiovascular;  Laterality: Left;   WRIST SURGERY     Patient Active Problem List   Diagnosis Date Noted   Shock circulatory (HCC) 02/06/2023   Lumbar compression fracture, closed, initial encounter (HCC) 01/05/2023   Alcohol dependence (HCC) 01/05/2023   Sinus tachycardia 10/08/2022   PAD (peripheral artery disease) (HCC) 04/26/2022   Hypertension 04/25/2022   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) 04/25/2022   HLD (hyperlipidemia) 04/03/2022   Claudication in peripheral vascular disease (HCC) 12/31/2021   Extensor tendon disruption 06/19/2020   Traumatic tear of supraspinatus tendon, right, initial encounter 12/02/2018   Rib contusion, right, initial encounter 12/02/2018   Trigger finger, acquired 06/29/2018   Mass of finger of left hand 11/01/2017   Closed fracture of first metacarpal bone of left hand with routine healing, subsequent encounter  09/21/2017    PCP: Doreen Beam  REFERRING PROVIDER: Norton Blizzard, MD  REFERRING DIAG: L sided lumbar radiculopathy  Rationale for Evaluation and Treatment: Rehabilitation  THERAPY DIAG:  Muscle spasm of back  Other low back pain  Muscle weakness (generalized)  S/P  kyphoplasty  ONSET DATE: 5 weeks ago  SUBJECTIVE:                                                                                                                                                                                           SUBJECTIVE STATEMENT: Just tight, no pain  PERTINENT HISTORY:  L 1 compression fx with kyphoplasty in January, recovered completely  PAIN:  re you having pain? Yes: NPRS scale: 0/10 Pain location: midline lumbar  Pain description: soreness, tight Aggravating factors: sitting or standing too long, being still  Relieving factors: light stretching  PRECAUTIONS: None  RED FLAGS: None   WEIGHT BEARING RESTRICTIONS: No  FALLS:  Has patient fallen in last 6 months? No  LIVING ENVIRONMENT: Lives with: lives alone Lives in: House/apartment Stairs: No Has following equipment at home: None  OCCUPATION: driving trucks delivering car parts  PLOF: Independent  PATIENT GOALS: get rid of this pain and be able to function and work  NEXT MD VISIT:   OBJECTIVE:   DIAGNOSTIC FINDINGS:  Radiographs lumbar :  IMPRESSION: 1. Slight interval vertebral body height loss at L2, concerning for a new compression fracture of indeterminate acuity. 2. Redemonstrated compression deformity of L1, now status post kyphoplasty.     Electronically Signed   By: Wiliam Ke M.D.   On: 06/19/2023 11:13      Res   PATIENT SURVEYS:  Modified Oswestry 17/50 34%   SCREENING FOR RED FLAGS: Bowel or bladder incontinence: No Spinal tumors: No Cauda equina syndrome: No Compression fracture: Yes: old L1 com fx from fall in jan healed, ? New comp fx L2 Abdominal aneurysm: No  COGNITION: Overall cognitive status: Within functional limits for tasks assessed     SENSATION: WFL   POSTURE: rounded shoulders, decreased lumbar lordosis, increased thoracic kyphosis, posterior pelvic tilt, and flexed trunk   PALPATION: Point tender B lower lumbar multifidi,  also  Segmental glides lower thoracic and lumbar spine non painful  LUMBAR ROM:   AROM eval  Flexion Fingertips to floor 6"  Extension 30p!  Right lateral flexion 60  Left lateral flexion 60  Right rotation nt  Left rotation nt   (Blank rows = not tested)  LOWER EXTREMITY ROM:   all Wnl    LOWER EXTREMITY MMT:  all burst testing wnl, able to heel and toe walk  I    LUMBAR SPECIAL TESTS:  Straight leg raise test: Negative, Single leg stance test: Negative, and FABER test: Negative  FUNCTIONAL TESTS:  5 times sit to stand: 12  GAIT: Distance walked: over 100 ' in clinic   TODAY'S TREATMENT:                                                                                                                              DATE:  815/24 NuStep L5 x65mins  Seated rows 45# 2x12 Lat pulls down 35# 2x12 BlackTB ext 2x10 S2S on airex w/ chest press 2x10 STM to lumbar paraspinals with thera gun  08/04/23 NuStep L5 x83mins  On MH, LE stretches and feet on pball rotations, K2C, small bridges STM to lumbar paraspinals  Seated rows 45# 2x10 Lat pulls down 35# 2x10 BlackTB ext 2x10 Shoulder ext 10# 2x10    07/28/23 NuStep L5 x 5 min STM to lumbar para spinales S2S OHP yellow ball 2x10 AR Press 15lb x10 each  Shoulder Ext 10lb 2x15 HS curls 35lb 2x12 Leg Ext 10lb 2x12 Supine bridges x10 LE on Pball bridges, K2C, Oblq HS, piriformis, and K2C stretch  07/23/23:  Trigger Point Dry-Needling  Treatment instructions: Expect mild to moderate muscle soreness. S/S of pneumothorax if dry needled over a lung field, and to seek immediate medical attention should they occur. Patient verbalized understanding of these instructions and education. Patient Consent Given: Yes Education handout provided: No Muscles treated: L4, L5 multifidus, iliocostalisR, multifidus L    Treatment response/outcome: Twitch Response Elicited   Educated in seated theraband hip and ER to fire multifidus, to  utilize in truck with sitting   PATIENT EDUCATION:  Education details: POC goals Person educated: Patient Education method: Programmer, multimedia, Facilities manager, and Actor cues Education comprehension: verbalized understanding, returned demonstration, and verbal cues required  HOME EXERCISE PROGRAM: TBD  ASSESSMENT:  CLINICAL IMPRESSION: Patient is a 68 y.o. male who was seen today for physical therapy treatment for strain LS spine. Injury occurred when lifting something at work from the ground. Pt enters with no pain only tightness.  He has a rounded posture with post pelvic tilt, loss of lumbar lordosis at rest. Shorten session per pt request to return to work. Increase tissue density noted with STM. Postural cues needed with shoulder with seated rows. Positive response to [facility] and session reporting improved mobility. Shoulder benefit from skilled PT to address his goals, funciton  OBJECTIVE IMPAIRMENTS: decreased activity tolerance, decreased ROM, decreased strength, hypomobility, impaired flexibility, improper body mechanics, postural dysfunction, and pain.   ACTIVITY LIMITATIONS: lifting, sitting, and sleeping  PARTICIPATION LIMITATIONS: community activity and occupation  PERSONAL FACTORS: Behavior pattern, Past/current experiences, Time since onset of injury/illness/exacerbation, and 1 comorbidity: previous L1 compression fx  are also affecting patient's functional outcome.   REHAB POTENTIAL: Good  CLINICAL DECISION MAKING: Stable/uncomplicated  EVALUATION COMPLEXITY: Low   GOALS: Goals reviewed with patient? Yes  SHORT TERM GOALS: Target date: 2  weeks 08/06/23  I HEP Baseline: Goal status: INITIAL   LONG TERM GOALS: Target date: 09/16/23  Modified oswestry 10/50 Baseline: 17/50 Goal status: INITIAL  2.  Improve ROM LS for extension from 30 to 70 degrees without pain Baseline:  Goal status: INITIAL  3.  Resolve pain with palpation lower lumbar paraspinals Baseline:  very tender/painful Goal status: INITIAL   PLAN:  PT FREQUENCY: 1-2x/week  PT DURATION: 8 weeks  PLANNED INTERVENTIONS: Therapeutic exercises, Therapeutic activity, Neuromuscular re-education, Balance training, Gait training, Patient/Family education, Self Care, and Joint mobilization.  PLAN FOR NEXT SESSION: how was dry needling, advance with LS progressive extension ex, stabilization, cardiovascular, other modalities as needed   Grayce Sessions, PTA 08/07/2023, 3:15 PM

## 2023-08-08 NOTE — Progress Notes (Signed)
Fax received from [facility] on 07/15/23 & 07/24/23 for medical clearance/medication hold for colonoscopy to be signed by V.W. Myra Gianotti, MD.  Provider signed on 07/31/23, form faxed back to sender on 08/05/23, verified successful, sent to scan center.

## 2023-08-11 ENCOUNTER — Ambulatory Visit: Payer: Medicare HMO

## 2023-08-11 ENCOUNTER — Other Ambulatory Visit: Payer: Self-pay

## 2023-08-11 DIAGNOSIS — M5459 Other low back pain: Secondary | ICD-10-CM

## 2023-08-11 DIAGNOSIS — M62830 Muscle spasm of back: Secondary | ICD-10-CM | POA: Diagnosis not present

## 2023-08-11 DIAGNOSIS — Z9889 Other specified postprocedural states: Secondary | ICD-10-CM | POA: Diagnosis not present

## 2023-08-11 DIAGNOSIS — M6281 Muscle weakness (generalized): Secondary | ICD-10-CM

## 2023-08-11 NOTE — Therapy (Signed)
` OUTPATIENT PHYSICAL THERAPY THORACOLUMBAR TREATMENT   Patient Name: Shawn Daniels MRN: [id number] DOB:Dec 02, 1956, 67 y.o., male Today's Date: 08/11/2023  END OF SESSION:  PT End of Session - 08/11/23 1518     Visit Number 5    Date for PT Re-Evaluation 09/17/23    PT Start Time 1518    PT Stop Time 1600    PT Time Calculation (min) 42 min               Past Medical History:  Diagnosis Date   Diabetes mellitus without complication (HCC)    GERD (gastroesophageal reflux disease)    Hypertension    Peripheral vascular disease (HCC)    Past Surgical History:  Procedure Laterality Date   ABDOMINAL AORTOGRAM W/LOWER EXTREMITY Right 08/20/2022   Procedure: ABDOMINAL AORTOGRAM W/LOWER EXTREMITY;  Surgeon: Nada Libman, MD;  [facility];  Service: Cardiovascular;  Laterality: Right;   ABDOMINAL AORTOGRAM W/LOWER EXTREMITY N/A 11/19/2022   Procedure: ABDOMINAL AORTOGRAM W/LOWER EXTREMITY;  Surgeon: Nada Libman, MD;  [facility];  Service: Cardiovascular;  Laterality: N/A;   ENDARTERECTOMY FEMORAL Right 04/26/2022   Procedure: RIGHT FEMORAL ENDARTERECTOMY;  Surgeon: Nada Libman, MD;  [facility]: [facility];  Service: Vascular;  Laterality: Right;   IR KYPHO LUMBAR INC FX REDUCE BONE BX UNI/BIL CANNULATION INC/IMAGING  01/13/2023   IR RADIOLOGIST EVAL & MGMT  01/29/2023   KNEE SURGERY     LOWER EXTREMITY ANGIOGRAPHY N/A 01/01/2022   Procedure: LOWER EXTREMITY ANGIOGRAPHY;  Surgeon: Elder Negus, MD;  [facility];  Service: Cardiovascular;  Laterality: N/A;   PATCH ANGIOPLASTY Right 04/26/2022   Procedure: PATCH ANGIOPLASTY OF RIGHT FEMORAL ARTERY USING XENOSURE BOVINE PERCARDIUM PATCH;  Surgeon: Nada Libman, MD;  [facility]: [facility];  Service: Vascular;  Laterality: Right;   PENILE PROSTHESIS IMPLANT     PERIPHERAL INTRAVASCULAR LITHOTRIPSY Left 11/19/2022   Procedure: INTRAVASCULAR LITHOTRIPSY;  Surgeon: Nada Libman, MD;  [facility];  Service: Cardiovascular;  Laterality: Left;   PERIPHERAL VASCULAR ATHERECTOMY  08/20/2022   Procedure: PERIPHERAL VASCULAR ATHERECTOMY;  Surgeon: Nada Libman, MD;  [facility];  Service: Cardiovascular;;  popliteal   PERIPHERAL VASCULAR BALLOON ANGIOPLASTY  01/08/2022   Procedure: PERIPHERAL VASCULAR BALLOON ANGIOPLASTY;  Surgeon: Yates Decamp, MD;  [facility];  Service: Cardiovascular;;   PERIPHERAL VASCULAR INTERVENTION Left 11/19/2022   Procedure: PERIPHERAL VASCULAR INTERVENTION;  Surgeon: Nada Libman, MD;  [facility];  Service: Cardiovascular;  Laterality: Left;   WRIST SURGERY     Patient Active Problem List   Diagnosis Date Noted   Shock circulatory (HCC) 02/06/2023   Lumbar compression fracture, closed, initial encounter (HCC) 01/05/2023   Alcohol dependence (HCC) 01/05/2023   Sinus tachycardia 10/08/2022   PAD (peripheral artery disease) (HCC) 04/26/2022   Hypertension 04/25/2022   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) 04/25/2022   HLD (hyperlipidemia) 04/03/2022   Claudication in peripheral vascular disease (HCC) 12/31/2021   Extensor tendon disruption 06/19/2020   Traumatic tear of supraspinatus tendon, right, initial encounter 12/02/2018   Rib contusion, right, initial encounter 12/02/2018   Trigger finger, acquired 06/29/2018   Mass of finger of left hand 11/01/2017   Closed fracture of first metacarpal bone of left hand with routine healing, subsequent encounter 09/21/2017    PCP: Doreen Beam  REFERRING PROVIDER: Norton Blizzard, MD  REFERRING DIAG: L sided  lumbar radiculopathy  Rationale for Evaluation and Treatment: Rehabilitation  THERAPY DIAG:  Muscle spasm of back  Other low back pain  Muscle weakness (generalized)  S/P kyphoplasty  ONSET DATE: 5 weeks ago  SUBJECTIVE:                                                                                                                                                                                            SUBJECTIVE STATEMENT: Was carrying a box over threshold in my home and caught my foot, jarred my back up again.    PERTINENT HISTORY:  L 1 compression fx with kyphoplasty in January, recovered completely  PAIN:  re you having pain? Yes: NPRS scale: 0/10 Pain location: midline lumbar  Pain description: soreness, tight Aggravating factors: sitting or standing too long, being still  Relieving factors: light stretching  PRECAUTIONS: None  RED FLAGS: None   WEIGHT BEARING RESTRICTIONS: No  FALLS:  Has patient fallen in last 6 months? No  LIVING ENVIRONMENT: Lives with: lives alone Lives in: House/apartment Stairs: No Has following equipment at home: None  OCCUPATION: driving trucks delivering car parts  PLOF: Independent  PATIENT GOALS: get rid of this pain and be able to function and work  NEXT MD VISIT:   OBJECTIVE:   DIAGNOSTIC FINDINGS:  Radiographs lumbar :  IMPRESSION: 1. Slight interval vertebral body height loss at L2, concerning for a new compression fracture of indeterminate acuity. 2. Redemonstrated compression deformity of L1, now status post kyphoplasty.     Electronically Signed   By: Wiliam Ke M.D.   On: 06/19/2023 11:13      Res   PATIENT SURVEYS:  Modified Oswestry 17/50 34%   SCREENING FOR RED FLAGS: Bowel or bladder incontinence: No Spinal tumors: No Cauda equina syndrome: No Compression fracture: Yes: old L1 com fx from fall in jan healed, ? New comp fx L2 Abdominal aneurysm: No  COGNITION: Overall cognitive status: Within functional limits for tasks assessed     SENSATION: WFL   POSTURE: rounded shoulders, decreased lumbar lordosis, increased thoracic kyphosis, posterior pelvic tilt, and flexed trunk   PALPATION: Point tender B lower lumbar multifidi, also  Segmental glides lower thoracic and lumbar spine  non painful  LUMBAR ROM:   AROM eval  Flexion Fingertips to floor 6"  Extension 30p!  Right lateral flexion 60  Left lateral flexion 60  Right rotation nt  Left rotation nt   (Blank rows = not tested)  LOWER EXTREMITY ROM:   all Wnl    LOWER EXTREMITY MMT:  all burst testing wnl, able to heel and toe walk I  LUMBAR SPECIAL TESTS:  Straight leg raise test: Negative, Single leg stance test: Negative, and FABER test: Negative  FUNCTIONAL TESTS:  5 times sit to stand: 12  GAIT: Distance walked: over 100 ' in clinic   TODAY'S TREATMENT:                                                                                                                              DATE:  08/11/23: Therex: Nustep 6 min Ue's and LE's level 5 Inst in supine hip flexor stretch off table  Manual: Sidelying  for muscle energy for lumbar flex, rot. SB restriction, 2 bouts on each side R side lying for lumbar extension, R side bending, rotation Supine for positional release for B PSOAS and iliacus Trigger Point Dry-Needling  Treatment instructions: Expect mild to moderate muscle soreness. S/S of pneumothorax if dry needled over a lung field, and to seek immediate medical attention should they occur. Patient verbalized understanding of these instructions and education. Patient Consent Given: Yes  Muscles treated: R multifidus, B gluteus maximus Treatment response/outcome: Twitch Response Elicited   815/24 NuStep L5 x28mins  Seated rows 45# 2x12 Lat pulls down 35# 2x12 BlackTB ext 2x10 S2S on airex w/ chest press 2x10 STM to lumbar paraspinals with thera gun  08/04/23 NuStep L5 x74mins  On MH, LE stretches and feet on pball rotations, K2C, small bridges STM to lumbar paraspinals  Seated rows 45# 2x10 Lat pulls down 35# 2x10 BlackTB ext 2x10 Shoulder ext 10# 2x10    07/28/23 NuStep L5 x 5 min STM to lumbar para spinales S2S OHP yellow ball 2x10 AR Press 15lb x10 each  Shoulder Ext 10lb  2x15 HS curls 35lb 2x12 Leg Ext 10lb 2x12 Supine bridges x10 LE on Pball bridges, K2C, Oblq HS, piriformis, and K2C stretch  07/23/23:  Trigger Point Dry-Needling  Treatment instructions: Expect mild to moderate muscle soreness. S/S of pneumothorax if dry needled over a lung field, and to seek immediate medical attention should they occur. Patient verbalized understanding of these instructions and education. Patient Consent Given: Yes Education handout provided: No Muscles treated: L4, L5 multifidus, iliocostalisR, multifidus L    Treatment response/outcome: Twitch Response Elicited   Educated in seated theraband hip and ER to fire multifidus, to utilize in truck with sitting   PATIENT EDUCATION:  Education details: POC goals Person educated: Patient Education method: Programmer, multimedia, Facilities manager, and Actor cues Education comprehension: verbalized understanding, returned demonstration, and verbal cues required  HOME EXERCISE PROGRAM: TBD  ASSESSMENT:  CLINICAL IMPRESSION: Patient is a 67 y.o. male who was seen today for physical therapy treatment for strain LS spine. Injury occurred when lifting something at work from the ground. Pt enters with no pain only tightness.  He has a rounded posture with post pelvic tilt, loss of lumbar lordosis at rest. Shorten session per pt request to return to work. Increase tissue density noted with STM. Postural cues needed with shoulder with seated rows.  Positive response to [facility] and session reporting improved mobility. Shoulder benefit from skilled PT to address his goals, funciton  OBJECTIVE IMPAIRMENTS: decreased activity tolerance, decreased ROM, decreased strength, hypomobility, impaired flexibility, improper body mechanics, postural dysfunction, and pain.   ACTIVITY LIMITATIONS: lifting, sitting, and sleeping  PARTICIPATION LIMITATIONS: community activity and occupation  PERSONAL FACTORS: Behavior pattern, Past/current experiences, Time  since onset of injury/illness/exacerbation, and 1 comorbidity: previous L1 compression fx  are also affecting patient's functional outcome.   REHAB POTENTIAL: Good  CLINICAL DECISION MAKING: Stable/uncomplicated  EVALUATION COMPLEXITY: Low   GOALS: Goals reviewed with patient? Yes  SHORT TERM GOALS: Target date: 2 weeks 08/06/23  I HEP Baseline: Goal status: INITIAL   LONG TERM GOALS: Target date: 09/16/23  Modified oswestry 10/50 Baseline: 17/50 Goal status: INITIAL  2.  Improve ROM LS for extension from 30 to 70 degrees without pain Baseline:  Goal status: INITIAL  3.  Resolve pain with palpation lower lumbar paraspinals Baseline: very tender/painful Goal status: INITIAL   PLAN:  PT FREQUENCY: 1-2x/week  PT DURATION: 8 weeks  PLANNED INTERVENTIONS: Therapeutic exercises, Therapeutic activity, Neuromuscular re-education, Balance training, Gait training, Patient/Family education, Self Care, and Joint mobilization.  PLAN FOR NEXT SESSION: how was dry needling, advance with LS progressive extension ex, stabilization, cardiovascular, other modalities as needed   Talita Recht L Sharena Dibenedetto, PT 08/11/2023, 4:50 PM

## 2023-08-12 ENCOUNTER — Other Ambulatory Visit: Payer: Self-pay

## 2023-08-12 MED ORDER — TIZANIDINE HCL 4 MG PO TABS: 4.0000 mg | ORAL_TABLET | Freq: Three times a day (TID) | ORAL | 1 refills | Status: DC | PRN

## 2023-08-12 NOTE — Progress Notes (Signed)
Pt called asking for a refill on  HYDROcodone-acetaminophen (NORCO) 5-325 MG tablet and tiZANidine (ZANAFLEX) 4 MG tablet   Per Dr. Pearletha Forge - ok to refill tizanidine. If the pt would like the Norco refilled he needs to come in for a f/u appt with provider.

## 2023-08-14 ENCOUNTER — Ambulatory Visit: Payer: Medicare HMO | Admitting: Physical Therapy

## 2023-08-18 ENCOUNTER — Encounter: Payer: Self-pay | Admitting: Surgery

## 2023-08-18 ENCOUNTER — Ambulatory Visit: Payer: Medicare HMO | Admitting: Surgery

## 2023-08-18 ENCOUNTER — Ambulatory Visit (INDEPENDENT_AMBULATORY_CARE_PROVIDER_SITE_OTHER)
Admission: RE | Admit: 2023-08-18 | Discharge: 2023-08-18 | Disposition: A | Discharge status: Home / Self Care | Payer: Medicare HMO | Source: Ambulatory Visit | Attending: Surgery | Admitting: Surgery

## 2023-08-18 ENCOUNTER — Ambulatory Visit (HOSPITAL_COMMUNITY)
Admission: RE | Admit: 2023-08-18 | Discharge: 2023-08-18 | Disposition: A | Discharge status: Home / Self Care | Payer: Medicare HMO | Source: Ambulatory Visit | Attending: Surgery | Admitting: Surgery

## 2023-08-18 VITALS — BP 104/70 | HR 91 | Temp 98.3°F | Resp 20 | Ht 73.0 in | Wt 198.0 lb

## 2023-08-18 DIAGNOSIS — I739 Peripheral vascular disease, unspecified: Secondary | ICD-10-CM | POA: Diagnosis not present

## 2023-08-18 DIAGNOSIS — I70211 Atherosclerosis of native arteries of extremities with intermittent claudication, right leg: Secondary | ICD-10-CM

## 2023-08-18 DIAGNOSIS — I70213 Atherosclerosis of native arteries of extremities with intermittent claudication, bilateral legs: Secondary | ICD-10-CM

## 2023-08-18 LAB — VAS US ABI WITH/WO TBI
Left ABI: 0.88
Right ABI: 1.16

## 2023-08-18 NOTE — Progress Notes (Signed)
[facility] [place]  Patient name: Shawn Daniels MRN: [id number] DOB: 02/17/1956 Sex: male   REASON FOR VISIT:    Follow up  HISOTRY OF PRESENT ILLNESS:   Shawn Daniels is a 67 y.o. male who is status post right iliofemoral endarterectomy with bovine pericardial patch angioplasty on 04/2022 for severe claudication.  Intraoperative findings included a nearly occlusive calcific plaque in the common femoral artery extending up into the distal external iliac artery.  The plaque also extended down onto the superficial femoral artery and profundofemoral artery.  A long patch approximately 8 cm was utilized, starting in the proximal common femoral artery and going down onto the superficial femoral artery for approximately 3 cm. HIs claudication symptoms improved, however he still was not satisfied and so on 08/20/2022 he underwent orbital atherectomy and drug-coated balloon angioplasty followed by stenting of the right popliteal artery.  Because he had such good results with the right leg, he wanted the left leg addressed.  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  This was treated with crossing of the lesion followed by intra-arterial lithotripsy and subsequent drug-eluting stent placement.  He has single-vessel runoff via the peroneal artery.  He is back today for follow-up.  Since I last saw him, he has had to have back surgery.  He was also hospitalized for diabetic ketoacidosis.  He     He is on dual antiplatelet therapy as well as a statin for hypercholesterolemia.  He is a former smoker.      PAST MEDICAL HISTORY:   Past Medical History:  Diagnosis Date   Diabetes mellitus without complication (HCC)    GERD (gastroesophageal reflux disease)    Hypertension    Peripheral vascular disease (HCC)      FAMILY HISTORY:   Family History   Problem Relation Age of Onset   Heart failure Father     SOCIAL HISTORY:   Social History   Tobacco Use   Smoking status: Former    Current packs/day: 0.00    Average packs/day: 1.5 packs/day for 30.0 years (45.0 ttl pk-yrs)    Types: Cigarettes, Cigars    Start date: 21    Quit date: 2009    Years since quitting: 15.6   Smokeless tobacco: Never  Substance Use Topics   Alcohol use: Yes    Alcohol/week: 3.0 standard drinks of alcohol    Types: 3 Cans of beer per week    Comment: daily     ALLERGIES:   Allergies  Allergen Reactions   Codeine Itching and Other (See Comments)    Can take/tolerate in lower doses   Keflex [Cephalexin] Other (See Comments)    "Shriveled my skin"   Penicillins Other (See Comments)    Reaction from childhood not recalled     CURRENT MEDICATIONS:   Current Outpatient Medications  Medication Sig Dispense Refill   aspirin EC 81 MG tablet Take 1 tablet (81 mg total) by mouth at bedtime. 30 tablet 12   clopidogrel (PLAVIX) 75 MG tablet Take 1 tablet (75 mg total) by mouth daily. 90 tablet 2   Empagliflozin-linaGLIPtin 25-5 MG TABS Take 1 tablet by mouth in the morning.     Evolocumab (REPATHA SURECLICK) 140 MG/ML SOAJ INJECT 1 PEN (140 MG) INTO THE SKIN EVERY 14 DAYS 6 mL 1   fenofibrate (TRICOR) 145 MG tablet Take 1 tablet by mouth once daily  90 tablet 3   HYDROcodone-acetaminophen (NORCO) 5-325 MG tablet Take 1 tablet by mouth every 6 (six) hours as needed. 20 tablet 0   lisinopril (ZESTRIL) 5 MG tablet Take 5 mg by mouth 2 (two) times daily.     Magnesium 400 MG CAPS Take 400 mg by mouth in the morning and at bedtime.     metoprolol succinate (TOPROL XL) 25 MG 24 hr tablet Take 1 tablet (25 mg total) by mouth daily.     NOVOLIN 70/30 RELION (70-30) 100 UNIT/ML injection Inject 30 Units into the skin See admin instructions. Inject up to 30u under the skin twice daily, according to sliding scale     omeprazole (PRILOSEC) 20 MG capsule Take  20 mg by mouth daily before breakfast.     rosuvastatin (CRESTOR) 10 MG tablet Take 10 mg by mouth at bedtime.     tiZANidine (ZANAFLEX) 4 MG tablet Take 1 tablet (4 mg total) by mouth 3 (three) times daily as needed for muscle spasms. 30 tablet 1   nitroGLYCERIN (NITRODUR - DOSED IN MG/24 HR) 0.2 mg/hr patch APPLY 1/4 (ONE-FOURTH) PATCH TO AFFECTED ELBOW, CHANGE DAILY (Patient not taking: Reported on 08/18/2023) 30 patch 1   No current facility-administered medications for this visit.   Facility-Administered Medications Ordered in Other Visits  Medication Dose Route Frequency Provider Last Rate Last Admin   iodixanol (VISIPAQUE) 320 MG/ML injection    PRN Nada Libman, MD   120 mL at 08/20/22 1145    REVIEW OF SYSTEMS:   [X]  denotes positive finding, [ ]  denotes negative finding Cardiac  Comments:  Chest pain or chest pressure:    Shortness of breath upon exertion:    Short of breath when lying flat:    Irregular heart rhythm:        Vascular    Pain in calf, thigh, or hip brought on by ambulation:    Pain in feet at night that wakes you up from your sleep:     Blood clot in your veins:    Leg swelling:         Pulmonary    Oxygen at home:    Productive cough:     Wheezing:         Neurologic    Sudden weakness in arms or legs:     Sudden numbness in arms or legs:     Sudden onset of difficulty speaking or slurred speech:    Temporary loss of vision in one eye:     Problems with dizziness:         Gastrointestinal    Blood in stool:     Vomited blood:         Genitourinary    Burning when urinating:     Blood in urine:        Psychiatric    Major depression:         Hematologic    Bleeding problems:    Problems with blood clotting too easily:        Skin    Rashes or ulcers:        Constitutional    Fever or chills:      PHYSICAL EXAM:   Vitals:   08/18/23 1502  BP: 104/70  Pulse: 91  Resp: 20  Temp: 98.3 F (36.8 C)  SpO2: 93%  Weight: 198  lb (89.8 kg)  Height: 6\' 1"  (1.854 m)    GENERAL: The patient is a well-nourished male, in  no acute distress. The vital signs are documented above. CARDIAC: There is a regular rate and rhythm.  VASCULAR: Palpable dorsalis pedis pulses PULMONARY: Non-labored respirations MUSCULOSKELETAL: There are no major deformities or cyanosis. NEUROLOGIC: No focal weakness or paresthesias are detected. SKIN: There are no ulcers or rashes noted. PSYCHIATRIC: The patient has a normal affect.  STUDIES:   I have reviewed the following:  ABI/TBIToday's ABIToday's TBIPrevious ABIPrevious TBI  +-------+-----------+-----------+------------+------------+  Right 1.16       0.60       1.09        0.60          +-------+-----------+-----------+------------+------------+  Left  0.88       0.62       0.94        0.48          +-------+-----------+-----------+------------+------------+  Right toe pressure: 93 Left toe pressure: 96 Waveforms are triphasic  Right: Patent stent with no evidence of stenosis in the popliteal artery  artery.   Left: 75-99% stenosis noted in the superficial femoral artery. Patent  stent with no evidence of stenosis in the popliteal artery artery.   MEDICAL ISSUES:   Claudication: The patient is status post successful bilateral lower extremity revascularization.  His symptoms have resolved.  Ultrasound today shows his interventions to be widely patent.  On the left however there is a stenosis in the native artery in the proximal superficial femoral artery.  This will need to be monitored periodically.  I will see him back in 6 months for repeat duplex ultrasound.  He will be maintained on dual antiplatelet therapy indefinitely.    Charlena Cross, MD, FACS [facility] [phone] Pager [phone]

## 2023-08-20 ENCOUNTER — Ambulatory Visit: Payer: Medicare HMO | Admitting: Family Medicine

## 2023-08-20 VITALS — BP 110/80 | Ht 73.0 in | Wt 196.0 lb

## 2023-08-20 DIAGNOSIS — M545 Low back pain, unspecified: Secondary | ICD-10-CM

## 2023-08-20 MED ORDER — HYDROCODONE-ACETAMINOPHEN 5-325 MG PO TABS: 1.0000 | ORAL_TABLET | Freq: Four times a day (QID) | ORAL | 0 refills | Status: DC | PRN

## 2023-08-20 NOTE — Patient Instructions (Signed)
We will go ahead with an MRI of your lumbar spine. Continue the muscle relaxant at bedtime. Take the hydrocodone sparingly - like I said it won't be a long term medication but we need to better figure out what's going on with your back. Treatment will depend on the MRI results.

## 2023-08-21 ENCOUNTER — Ambulatory Visit: Payer: Medicare HMO

## 2023-08-21 NOTE — Progress Notes (Signed)
PCP: Ignatius Specking, MD  Subjective:   HPI: Patient is a 67 y.o. male here for low back pain.  6/27: Patient reports he had a compression fracture L1 earlier this year with kyphoplasty. He completely improved from this. Then in past week he developed left side low back pain - thinks from lifting heavy things at work though no acute injury. No radiation of pain. No pain in center of back. No numbness/tingling, bowel/bladder dysfunction. He's tried ibuprofen, robaxin.  7/8: At last visit was encouraged to use tramadol, tizanidine, tylenol, and heat. Radiograph of lumbar spine showed slight interval vertebral body height loss at L2 concerning for new compression fracture.  Today, reports continued pain left lateral lower back - tizanidine temporarily helps and he has been using tramadol frequently. Pain is nonradiating. No numbness/tingling in extremities. No shooting pain. No saddle anesthesia or bowel/bladder dysfunction.  No midline pain.  8/28: Patient reports he continues to have pain in low back despite conservative measures. Continue with home exercises, tizanidine at night.  Was taking 1/2 hydrocodone about 4 in the morning when it would wake him up which would help. Trigger point injection did not help him. Doing home exercise program also without much benefit. No radiation into lower extremities. No numbness/tingling. No bowel/bladder dysfunction.  Past Medical History:  Diagnosis Date   Diabetes mellitus without complication (HCC)    GERD (gastroesophageal reflux disease)    Hypertension    Peripheral vascular disease (HCC)     Current Outpatient Medications on File Prior to Visit  Medication Sig Dispense Refill   aspirin EC 81 MG tablet Take 1 tablet (81 mg total) by mouth at bedtime. 30 tablet 12   clopidogrel (PLAVIX) 75 MG tablet Take 1 tablet (75 mg total) by mouth daily. 90 tablet 2   Empagliflozin-linaGLIPtin 25-5 MG TABS Take 1 tablet by mouth in the morning.      Evolocumab (REPATHA SURECLICK) 140 MG/ML SOAJ INJECT 1 PEN (140 MG) INTO THE SKIN EVERY 14 DAYS 6 mL 1   fenofibrate (TRICOR) 145 MG tablet Take 1 tablet by mouth once daily 90 tablet 3   lisinopril (ZESTRIL) 5 MG tablet Take 5 mg by mouth 2 (two) times daily.     Magnesium 400 MG CAPS Take 400 mg by mouth in the morning and at bedtime.     metoprolol succinate (TOPROL XL) 25 MG 24 hr tablet Take 1 tablet (25 mg total) by mouth daily.     nitroGLYCERIN (NITRODUR - DOSED IN MG/24 HR) 0.2 mg/hr patch APPLY 1/4 (ONE-FOURTH) PATCH TO AFFECTED ELBOW, CHANGE DAILY (Patient not taking: Reported on 08/18/2023) 30 patch 1   NOVOLIN 70/30 RELION (70-30) 100 UNIT/ML injection Inject 30 Units into the skin See admin instructions. Inject up to 30u under the skin twice daily, according to sliding scale     omeprazole (PRILOSEC) 20 MG capsule Take 20 mg by mouth daily before breakfast.     rosuvastatin (CRESTOR) 10 MG tablet Take 10 mg by mouth at bedtime.     tiZANidine (ZANAFLEX) 4 MG tablet Take 1 tablet (4 mg total) by mouth 3 (three) times daily as needed for muscle spasms. 30 tablet 1   Current Facility-Administered Medications on File Prior to Visit  Medication Dose Route Frequency Provider Last Rate Last Admin   iodixanol (VISIPAQUE) 320 MG/ML injection    PRN Nada Libman, MD   120 mL at 08/20/22 1145    Past Surgical History:  Procedure Laterality Date  ABDOMINAL AORTOGRAM W/LOWER EXTREMITY Right 08/20/2022   Procedure: ABDOMINAL AORTOGRAM W/LOWER EXTREMITY;  Surgeon: Nada Libman, MD;  Location: [facility];  Service: Cardiovascular;  Laterality: Right;   ABDOMINAL AORTOGRAM W/LOWER EXTREMITY N/A 11/19/2022   Procedure: ABDOMINAL AORTOGRAM W/LOWER EXTREMITY;  Surgeon: Nada Libman, MD;  Location: [facility];  Service: Cardiovascular;  Laterality: N/A;   ENDARTERECTOMY FEMORAL Right 04/26/2022   Procedure: RIGHT FEMORAL ENDARTERECTOMY;  Surgeon: Nada Libman, MD;   Location: [facility];  Service: Vascular;  Laterality: Right;   IR KYPHO LUMBAR INC FX REDUCE BONE BX UNI/BIL CANNULATION INC/IMAGING  01/13/2023   IR RADIOLOGIST EVAL & MGMT  01/29/2023   KNEE SURGERY     LOWER EXTREMITY ANGIOGRAPHY N/A 01/01/2022   Procedure: LOWER EXTREMITY ANGIOGRAPHY;  Surgeon: Elder Negus, MD;  Location: [facility];  Service: Cardiovascular;  Laterality: N/A;   PATCH ANGIOPLASTY Right 04/26/2022   Procedure: PATCH ANGIOPLASTY OF RIGHT FEMORAL ARTERY USING XENOSURE BOVINE PERCARDIUM PATCH;  Surgeon: Nada Libman, MD;  Location: [facility];  Service: Vascular;  Laterality: Right;   PENILE PROSTHESIS IMPLANT     PERIPHERAL INTRAVASCULAR LITHOTRIPSY Left 11/19/2022   Procedure: INTRAVASCULAR LITHOTRIPSY;  Surgeon: Nada Libman, MD;  Location: [facility];  Service: Cardiovascular;  Laterality: Left;   PERIPHERAL VASCULAR ATHERECTOMY  08/20/2022   Procedure: PERIPHERAL VASCULAR ATHERECTOMY;  Surgeon: Nada Libman, MD;  Location: [facility];  Service: Cardiovascular;;  popliteal   PERIPHERAL VASCULAR BALLOON ANGIOPLASTY  01/08/2022   Procedure: PERIPHERAL VASCULAR BALLOON ANGIOPLASTY;  Surgeon: Yates Decamp, MD;  Location: [facility];  Service: Cardiovascular;;   PERIPHERAL VASCULAR INTERVENTION Left 11/19/2022   Procedure: PERIPHERAL VASCULAR INTERVENTION;  Surgeon: Nada Libman, MD;  Location: [facility];  Service: Cardiovascular;  Laterality: Left;   WRIST SURGERY      Allergies  Allergen Reactions   Codeine Itching and Other (See Comments)    Can take/tolerate in lower doses   Keflex [Cephalexin] Other (See Comments)    "Shriveled my skin"   Penicillins Other (See Comments)    Reaction from childhood not recalled    BP 110/80   Ht 6\' 1"  (1.854 m)   Wt 196 lb (88.9 kg)   BMI 25.86 kg/m       No data to display              No data to display              Objective:  Physical Exam:  Gen: NAD,  comfortable in exam room  Back: No gross deformity, scoliosis. No paraspinal TTP.  No midline or bony TTP. FROM with pain on trunk rotation to the right. Strength LEs 5/5 all muscle groups.   Trace MSRs in patellar and achilles tendons, equal bilaterally. Negative SLRs. Sensation intact to light touch bilaterally.   Assessment & Plan:  1. Low back pain - unfortunately not improving with > 6 weeks of conservative treatment including home exercise program, tizanidine, tramadol, hydrocodone, heat.  Radiographs with possible new L2 compression fracture previously.  Will proceed with MRI to assess given lack of improvement to assess for disc herniation, compression fracture.  Tizanidine with hydrocodone as needed.

## 2023-08-23 ENCOUNTER — Emergency Department (HOSPITAL_COMMUNITY): Payer: Medicare HMO

## 2023-08-23 ENCOUNTER — Other Ambulatory Visit: Payer: Self-pay

## 2023-08-23 ENCOUNTER — Emergency Department (HOSPITAL_COMMUNITY)
Admission: EM | Admit: 2023-08-23 | Discharge: 2023-08-23 | Disposition: A | Discharge status: Home / Self Care | Payer: Medicare HMO | Attending: Emergency Medicine | Admitting: Emergency Medicine

## 2023-08-23 ENCOUNTER — Encounter (HOSPITAL_COMMUNITY): Payer: Self-pay

## 2023-08-23 DIAGNOSIS — E161 Other hypoglycemia: Secondary | ICD-10-CM | POA: Diagnosis not present

## 2023-08-23 DIAGNOSIS — I10 Essential (primary) hypertension: Secondary | ICD-10-CM | POA: Diagnosis not present

## 2023-08-23 DIAGNOSIS — I708 Atherosclerosis of other arteries: Secondary | ICD-10-CM | POA: Diagnosis not present

## 2023-08-23 DIAGNOSIS — R0689 Other abnormalities of breathing: Secondary | ICD-10-CM | POA: Diagnosis not present

## 2023-08-23 DIAGNOSIS — R0602 Shortness of breath: Secondary | ICD-10-CM

## 2023-08-23 DIAGNOSIS — Z7982 Long term (current) use of aspirin: Secondary | ICD-10-CM | POA: Insufficient documentation

## 2023-08-23 DIAGNOSIS — R739 Hyperglycemia, unspecified: Secondary | ICD-10-CM | POA: Diagnosis not present

## 2023-08-23 DIAGNOSIS — Z1152 Encounter for screening for COVID-19: Secondary | ICD-10-CM | POA: Insufficient documentation

## 2023-08-23 DIAGNOSIS — E1165 Type 2 diabetes mellitus with hyperglycemia: Secondary | ICD-10-CM | POA: Diagnosis not present

## 2023-08-23 DIAGNOSIS — Z743 Need for continuous supervision: Secondary | ICD-10-CM | POA: Diagnosis not present

## 2023-08-23 DIAGNOSIS — Z794 Long term (current) use of insulin: Secondary | ICD-10-CM | POA: Diagnosis not present

## 2023-08-23 DIAGNOSIS — Z7901 Long term (current) use of anticoagulants: Secondary | ICD-10-CM | POA: Diagnosis not present

## 2023-08-23 DIAGNOSIS — E162 Hypoglycemia, unspecified: Secondary | ICD-10-CM | POA: Diagnosis not present

## 2023-08-23 DIAGNOSIS — I2510 Atherosclerotic heart disease of native coronary artery without angina pectoris: Secondary | ICD-10-CM | POA: Diagnosis not present

## 2023-08-23 DIAGNOSIS — I959 Hypotension, unspecified: Secondary | ICD-10-CM | POA: Diagnosis not present

## 2023-08-23 DIAGNOSIS — R5383 Other fatigue: Secondary | ICD-10-CM | POA: Diagnosis not present

## 2023-08-23 DIAGNOSIS — R064 Hyperventilation: Secondary | ICD-10-CM | POA: Diagnosis not present

## 2023-08-23 DIAGNOSIS — R42 Dizziness and giddiness: Secondary | ICD-10-CM | POA: Diagnosis not present

## 2023-08-23 LAB — COMPREHENSIVE METABOLIC PANEL
ALT: 22 U/L (ref 0–44)
AST: 30 U/L (ref 15–41)
Albumin: 4.1 g/dL (ref 3.5–5.0)
Alkaline Phosphatase: 66 U/L (ref 38–126)
Anion gap: 11 (ref 5–15)
BUN: 17 mg/dL (ref 8–23)
CO2: 21 mmol/L — ABNORMAL LOW (ref 22–32)
Calcium: 9.3 mg/dL (ref 8.9–10.3)
Chloride: 101 mmol/L (ref 98–111)
Creatinine, Ser: 1.11 mg/dL (ref 0.61–1.24)
GFR, Estimated: >60 mL/min (ref >60)
Glucose, Bld: 265 mg/dL — ABNORMAL HIGH (ref 70–99)
Potassium: 3.7 mmol/L (ref 3.5–5.1)
Sodium: 133 mmol/L — ABNORMAL LOW (ref 135–145)
Total Bilirubin: 0.8 mg/dL (ref 0.3–1.2)
Total Protein: 7.4 g/dL (ref 6.5–8.1)

## 2023-08-23 LAB — I-STAT CHEM 8, ED
BUN: 20 mg/dL (ref 8–23)
Calcium, Ion: 1.2 mmol/L (ref 1.15–1.40)
Chloride: 100 mmol/L (ref 98–111)
Creatinine, Ser: 0.9 mg/dL (ref 0.61–1.24)
Glucose, Bld: 239 mg/dL — ABNORMAL HIGH (ref 70–99)
HCT: 37 % — ABNORMAL LOW (ref 39.0–52.0)
Hemoglobin: 12.6 g/dL — ABNORMAL LOW (ref 13.0–17.0)
Potassium: 4 mmol/L (ref 3.5–5.1)
Sodium: 135 mmol/L (ref 135–145)
TCO2: 22 mmol/L (ref 22–32)

## 2023-08-23 LAB — URINALYSIS, ROUTINE W REFLEX MICROSCOPIC
Bacteria, UA: NONE SEEN
Bilirubin Urine: NEGATIVE
Glucose, UA: >=500 mg/dL — AB
Hgb urine dipstick: NEGATIVE
Ketones, ur: 5 mg/dL — AB
Leukocytes,Ua: NEGATIVE
Nitrite: NEGATIVE
Protein, ur: NEGATIVE mg/dL
Specific Gravity, Urine: 1.03 (ref 1.005–1.030)
pH: 6 (ref 5.0–8.0)

## 2023-08-23 LAB — CBC
HCT: 33.7 % — ABNORMAL LOW (ref 39.0–52.0)
Hemoglobin: 11.3 g/dL — ABNORMAL LOW (ref 13.0–17.0)
MCH: 32 pg (ref 26.0–34.0)
MCHC: 33.5 g/dL (ref 30.0–36.0)
MCV: 95.5 fL (ref 80.0–100.0)
Platelets: 125 10*3/uL — ABNORMAL LOW (ref 150–400)
RBC: 3.53 MIL/uL — ABNORMAL LOW (ref 4.22–5.81)
RDW: 13 % (ref 11.5–15.5)
WBC: 5.1 10*3/uL (ref 4.0–10.5)
nRBC: 0 % (ref 0.0–0.2)

## 2023-08-23 LAB — SARS CORONAVIRUS 2 BY RT PCR: SARS Coronavirus 2 by RT PCR: NEGATIVE

## 2023-08-23 LAB — D-DIMER, QUANTITATIVE: D-Dimer, Quant: 1.42 ug{FEU}/mL — ABNORMAL HIGH (ref 0.00–0.50)

## 2023-08-23 LAB — TSH: TSH: 1.214 u[IU]/mL (ref 0.350–4.500)

## 2023-08-23 LAB — VITAMIN B12: Vitamin B-12: 320 pg/mL (ref 180–914)

## 2023-08-23 LAB — CBG MONITORING, ED
Glucose-Capillary: 257 mg/dL — ABNORMAL HIGH (ref 70–99)
Glucose-Capillary: 200 mg/dL — ABNORMAL HIGH (ref 70–99)

## 2023-08-23 MED ORDER — IOHEXOL 350 MG/ML SOLN
75.0000 mL | Freq: Once | INTRAVENOUS | Status: AC | PRN
  Administered 2023-08-23: 75 mL via INTRAVENOUS

## 2023-08-23 NOTE — ED Notes (Signed)
Pt requesting food, [place], [place] approved

## 2023-08-23 NOTE — ED Notes (Signed)
Pt ambulated with pulse ox.  O2 sats between 90-92, HR 90s.

## 2023-08-23 NOTE — Discharge Instructions (Signed)
It was a pleasure taking part in your care today.  As we discussed, you work was reassuring.  I would like you to follow-up with your PCP this week for reevaluation.  If you develop any new or worsening signs or symptoms in the interim please return to the ED for further management and care.  Please continue taking all medications as prescribed.

## 2023-08-23 NOTE — ED Notes (Signed)
CBG 200 

## 2023-08-23 NOTE — ED Triage Notes (Addendum)
Patient BIB [facility] from home. Felt short of breath when EMS arrived. Over last few weeks has not had any energy, lethargic.   CBG 350 on patients dexcom  EMS 70/40 BP and 120 HR Received fluids 136/61 after fluids HR 87

## 2023-08-23 NOTE — ED Provider Notes (Signed)
[place] EMERGENCY DEPARTMENT AT [facility] Provider Note   CSN: [id number] Arrival date & time: 08/23/23  1403     History  Chief Complaint  Patient presents with   Hyperglycemia    Shawn Daniels is a 67 y.o. male with medical history significant for diabetes, GERD, hypertension, PVD on Plavix, alcohol dependence.  Patient presents to ED for evaluation of shortness of breath, lightheadedness.  Patient states that he has been progressively short of breath over the last couple of weeks.  States that today he was with his friend moving out of an apartment when he felt very short of breath and lightheaded.  Patient states that EMS arrived, they noted he was hypotensive and provided fluid.  Patient states that after receiving fluid he feels much better.  He goes on to state that he has been experiencing a lot of emotional stressors recently, issues with his personal life.  He states he has been feeling very fatigued over the last couple of months, does not have as much energy.  He states that his blood sugars typically well-controlled however here today it is in the 200s.  He did denies abdominal pain, nausea, vomiting, fevers, chest pain.  Denies leg swelling.   Hyperglycemia Associated symptoms: fatigue and shortness of breath   Associated symptoms: no abdominal pain, no chest pain, no fever, no nausea and no vomiting        Home Medications Prior to Admission medications   Medication Sig Start Date End Date Taking? Authorizing Provider  aspirin EC 81 MG tablet Take 1 tablet (81 mg total) by mouth at bedtime. 01/15/23   [place], Alvira Philips, DO  clopidogrel (PLAVIX) 75 MG tablet Take 1 tablet (75 mg total) by mouth daily. 01/15/23   [place], Eric J, DO  Empagliflozin-linaGLIPtin 25-5 MG TABS Take 1 tablet by mouth in the morning.    [provider]  Evolocumab (REPATHA SURECLICK) 140 MG/ML SOAJ INJECT 1 PEN (140 MG) INTO THE SKIN EVERY 14 DAYS 03/21/23   Nahser,  Deloris Ping, MD  fenofibrate (TRICOR) 145 MG tablet Take 1 tablet by mouth once daily 06/10/23   Nahser, Deloris Ping, MD  HYDROcodone-acetaminophen (NORCO) 5-325 MG tablet Take 1 tablet by mouth every 6 (six) hours as needed. 08/20/23   Hudnall, Azucena Fallen, MD  lisinopril (ZESTRIL) 5 MG tablet Take 5 mg by mouth 2 (two) times daily. 06/08/23   [provider]  Magnesium 400 MG CAPS Take 400 mg by mouth in the morning and at bedtime.    [provider]  metoprolol succinate (TOPROL XL) 25 MG 24 hr tablet Take 1 tablet (25 mg total) by mouth daily. 07/10/23   Nahser, Deloris Ping, MD  nitroGLYCERIN (NITRODUR - DOSED IN MG/24 HR) 0.2 mg/hr patch APPLY 1/4 (ONE-FOURTH) PATCH TO AFFECTED ELBOW, CHANGE DAILY Patient not taking: Reported on 08/18/2023 09/09/22   Hudnall, Azucena Fallen, MD  NOVOLIN 70/30 RELION (70-30) 100 UNIT/ML injection Inject 30 Units into the skin See admin instructions. Inject up to 30u under the skin twice daily, according to sliding scale    [provider]  omeprazole (PRILOSEC) 20 MG capsule Take 20 mg by mouth daily before breakfast.    [provider]  rosuvastatin (CRESTOR) 10 MG tablet Take 10 mg by mouth at bedtime.    [provider]  tiZANidine (ZANAFLEX) 4 MG tablet Take 1 tablet (4 mg total) by mouth 3 (three) times daily as needed for muscle spasms. 08/12/23  Lenda Kelp, MD      Allergies    Codeine, Keflex [cephalexin], and Penicillins    Review of Systems   Review of Systems  Constitutional:  Positive for fatigue. Negative for fever.  Respiratory:  Positive for shortness of breath.   Cardiovascular:  Negative for chest pain and leg swelling.  Gastrointestinal:  Negative for abdominal pain, nausea and vomiting.  Neurological:  Positive for light-headedness.  All other systems reviewed and are negative.   Physical Exam Updated Vital Signs BP (!) 152/77   Pulse 77   Temp 97.9 F (36.6 C) (Oral)   Resp (!) 23   Ht 6\' 1"  (1.854 m)    Wt 88 kg   SpO2 95%   BMI 25.60 kg/m  Physical Exam Vitals and nursing note reviewed.  Constitutional:      General: He is not in acute distress.    Appearance: Normal appearance. He is not ill-appearing, toxic-appearing or diaphoretic.  HENT:     Head: Normocephalic and atraumatic.     Nose: Nose normal.     Mouth/Throat:     Mouth: Mucous membranes are moist.     Pharynx: Oropharynx is clear.  Eyes:     Extraocular Movements: Extraocular movements intact.     Conjunctiva/sclera: Conjunctivae normal.     Pupils: Pupils are equal, round, and reactive to light.  Cardiovascular:     Rate and Rhythm: Normal rate and regular rhythm.  Pulmonary:     Effort: Pulmonary effort is normal.     Breath sounds: Normal breath sounds. No wheezing.  Abdominal:     General: Abdomen is flat. Bowel sounds are normal.     Palpations: Abdomen is soft.     Tenderness: There is no abdominal tenderness.  Musculoskeletal:     Cervical back: Normal range of motion and neck supple. No tenderness.     Right lower leg: No edema.     Left lower leg: No edema.  Skin:    General: Skin is warm and dry.     Capillary Refill: Capillary refill takes less than 2 seconds.  Neurological:     Mental Status: He is alert and oriented to person, place, and time.     ED Results / Procedures / Treatments   Labs (all labs ordered are listed, but only abnormal results are displayed) Labs Reviewed  CBC - Abnormal; Notable for the following components:      Result Value   RBC 3.53 (*)    Hemoglobin 11.3 (*)    HCT 33.7 (*)    Platelets 125 (*)    All other components within normal limits  URINALYSIS, ROUTINE W REFLEX MICROSCOPIC - Abnormal; Notable for the following components:   Color, Urine STRAW (*)    Glucose, UA >=500 (*)    Ketones, ur 5 (*)    All other components within normal limits  COMPREHENSIVE METABOLIC PANEL - Abnormal; Notable for the following components:   Sodium 133 (*)    CO2 21 (*)     Glucose, Bld 265 (*)    All other components within normal limits  D-DIMER, QUANTITATIVE - Abnormal; Notable for the following components:   D-Dimer, Quant 1.42 (*)    All other components within normal limits  CBG MONITORING, ED - Abnormal; Notable for the following components:   Glucose-Capillary 257 (*)    All other components within normal limits  I-STAT CHEM 8, ED - Abnormal; Notable for the following components:   Glucose,  Bld 239 (*)    Hemoglobin 12.6 (*)    HCT 37.0 (*)    All other components within normal limits  SARS CORONAVIRUS 2 BY RT PCR  VITAMIN B12  TSH  CBG MONITORING, ED    EKG None  Radiology CT Angio Chest PE W and/or Wo Contrast  Result Date: 08/23/2023 CLINICAL DATA:  Dizziness, labored breathing, positive D-dimer EXAM: CT ANGIOGRAPHY CHEST WITH CONTRAST TECHNIQUE: Multidetector CT imaging of the chest was performed using the standard protocol during bolus administration of intravenous contrast. Multiplanar CT image reconstructions and MIPs were obtained to evaluate the vascular anatomy. RADIATION DOSE REDUCTION: This exam was performed according to the departmental dose-optimization program which includes automated exposure control, adjustment of the mA and/or kV according to patient size and/or use of iterative reconstruction technique. CONTRAST:  75mL OMNIPAQUE IOHEXOL 350 MG/ML SOLN COMPARISON:  08/23/2023 FINDINGS: Cardiovascular: This is a technically adequate evaluation of the pulmonary vasculature. No filling defects or pulmonary emboli. The heart is unremarkable without pericardial effusion. No evidence of thoracic aortic aneurysm or dissection. Atherosclerosis of the aorta and coronary vasculature. 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 Mediastinum/Nodes: No enlarged mediastinal, hilar, or axillary lymph nodes.  Thyroid gland, trachea, and esophagus demonstrate no significant findings. Lungs/Pleura: Background emphysema. No acute airspace disease, effusion, or pneumothorax. Central airways are patent. Upper Abdomen: No acute abnormality. Musculoskeletal: There are no acute or destructive bony abnormalities. Chronic L1 compression deformity and vertebral augmentation noted. Reconstructed images demonstrate no additional findings. Review of the MIP images confirms the above findings. IMPRESSION: 1. No evidence of pulmonary embolus. 2. No acute intrathoracic process. 3. Aortic Atherosclerosis (ICD10-I70.0). 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 Given location, this could lead to subclavian steal phenomenon. Further evaluation with nonemergent outpatient carotid Doppler ultrasound may be useful. 4. Coronary artery atherosclerosis. Electronically Signed   By: Sharlet Salina M.D.   On: 08/23/2023 17:00   DG Chest 2 View  Result Date: 08/23/2023 CLINICAL DATA:  Shortness of breath today.  Lethargy.  Hypotensive. EXAM: CHEST - 2 VIEW COMPARISON:  Chest radiographs 02/06/2023 and 01/05/2023 intraoperative fluoroscopy during L1 kyphoplasty 01/13/2023 FINDINGS: Cardiac silhouette and mediastinal contours are within normal limits. Mild chronic interstitial thickening is unchanged. No focal airspace opacity. No pulmonary edema, pleural effusion, or pneumothorax. Unchanged kyphoplasty cement within the L1 level. Note is made this procedure was performed 01/13/2023. Old healed posterior right rib fractures. IMPRESSION: 1. No acute cardiopulmonary process. 2. Unchanged likely kyphoplasty cement within the L1 level. Electronically Signed   By: Neita Garnet M.D.   On: 08/23/2023 15:47    Procedures Procedures   Medications Ordered in ED Medications  iohexol (OMNIPAQUE) 350 MG/ML injection 75 mL (75 mLs Intravenous Contrast Given 08/23/23 1557)    ED  Course/ Medical Decision Making/ A&P    Medical Decision Making Amount and/or Complexity of Data Reviewed Labs: ordered. Radiology: ordered.   67 year old male presents to the ED for evaluation.  Please see HPI for further details.  On examination patient is afebrile and nontachycardic.  His lung sounds are clear bilaterally, he is not hypoxic.  His abdomen is soft and compressible throughout.  His neurological examination is at baseline.  No edema to bilateral lower extremities.  Patient CBC unremarkable without leukocytosis, baseline hemoglobin.  The patient metabolic panel shows sodium  133, glucose 265 however anion gap 11, no elevated LFTs, no creatinine elevation.  The patient urinalysis is unremarkable.  The patient vitamin B12 is within normal notes.  Patient TSH WNL.  Patient D-dimer elevated at 1.42.  Will proceed with CT angiogram.  The patient chest x-ray is unremarkable.  CT angiogram is unremarkable for PE, other acute process.  At this time, the patient was ambulated and he maintains oxygen saturation.  The patient blood pressure is normalized after receiving fluids en route with EMS.  Patient advised to follow-up with PCP at this time.  Encouraged to return to the ED with any new or worsening signs or symptoms and he voiced understanding.  He had all of his questions answered to his satisfaction.  He is stable to discharge at this time.   Final Clinical Impression(s) / ED Diagnoses Final diagnoses:  Shortness of breath  Hyperglycemia    Rx / DC Orders ED Discharge Orders     None         Al Decant, PA-C 08/23/23 1845    Wynetta Fines, MD 08/30/23 [id number]

## 2023-08-23 NOTE — ED Notes (Signed)
Patient has a urine culture in the main lab 

## 2023-08-26 ENCOUNTER — Ambulatory Visit: Payer: Medicare HMO | Attending: Family Medicine | Admitting: Physical Therapy

## 2023-08-26 DIAGNOSIS — M62830 Muscle spasm of back: Secondary | ICD-10-CM | POA: Insufficient documentation

## 2023-08-26 DIAGNOSIS — Z9889 Other specified postprocedural states: Secondary | ICD-10-CM | POA: Insufficient documentation

## 2023-08-26 DIAGNOSIS — M6281 Muscle weakness (generalized): Secondary | ICD-10-CM | POA: Insufficient documentation

## 2023-08-26 DIAGNOSIS — M5459 Other low back pain: Secondary | ICD-10-CM | POA: Insufficient documentation

## 2023-08-28 ENCOUNTER — Encounter: Payer: Self-pay | Admitting: Physical Therapy

## 2023-08-28 ENCOUNTER — Ambulatory Visit: Payer: Medicare HMO | Admitting: Physical Therapy

## 2023-08-28 DIAGNOSIS — M6281 Muscle weakness (generalized): Secondary | ICD-10-CM | POA: Diagnosis not present

## 2023-08-28 DIAGNOSIS — Z9889 Other specified postprocedural states: Secondary | ICD-10-CM | POA: Diagnosis not present

## 2023-08-28 DIAGNOSIS — M62830 Muscle spasm of back: Secondary | ICD-10-CM | POA: Diagnosis not present

## 2023-08-28 DIAGNOSIS — M5459 Other low back pain: Secondary | ICD-10-CM | POA: Diagnosis not present

## 2023-08-28 NOTE — Therapy (Signed)
` OUTPATIENT PHYSICAL THERAPY THORACOLUMBAR TREATMENT   Patient Name: Shawn Daniels MRN: [id number] DOB:Sep 26, 1956, 67 y.o., male Today's Date: 08/28/2023  END OF SESSION:  PT End of Session - 08/28/23 1531     Visit Number 6    Date for PT Re-Evaluation 09/17/23    PT Start Time 1518    PT Stop Time 1543   pt had to leave early   PT Time Calculation (min) 25 min    Activity Tolerance Patient tolerated treatment well    Behavior During Therapy [facility] for tasks assessed/performed                Past Medical History:  Diagnosis Date   Diabetes mellitus without complication (HCC)    GERD (gastroesophageal reflux disease)    Hypertension    Peripheral vascular disease (HCC)    Past Surgical History:  Procedure Laterality Date   ABDOMINAL AORTOGRAM W/LOWER EXTREMITY Right 08/20/2022   Procedure: ABDOMINAL AORTOGRAM W/LOWER EXTREMITY;  Surgeon: Nada Libman, MD;  Location: [facility];  Service: Cardiovascular;  Laterality: Right;   ABDOMINAL AORTOGRAM W/LOWER EXTREMITY N/A 11/19/2022   Procedure: ABDOMINAL AORTOGRAM W/LOWER EXTREMITY;  Surgeon: Nada Libman, MD;  Location: [facility];  Service: Cardiovascular;  Laterality: N/A;   ENDARTERECTOMY FEMORAL Right 04/26/2022   Procedure: RIGHT FEMORAL ENDARTERECTOMY;  Surgeon: Nada Libman, MD;  Location: [facility] OR;  Service: Vascular;  Laterality: Right;   IR KYPHO LUMBAR INC FX REDUCE BONE BX UNI/BIL CANNULATION INC/IMAGING  01/13/2023   IR RADIOLOGIST EVAL & MGMT  01/29/2023   KNEE SURGERY     LOWER EXTREMITY ANGIOGRAPHY N/A 01/01/2022   Procedure: LOWER EXTREMITY ANGIOGRAPHY;  Surgeon: Elder Negus, MD;  Location: [facility];  Service: Cardiovascular;  Laterality: N/A;   PATCH ANGIOPLASTY Right 04/26/2022   Procedure: PATCH ANGIOPLASTY OF RIGHT FEMORAL ARTERY USING XENOSURE BOVINE PERCARDIUM PATCH;  Surgeon: Nada Libman, MD;  Location: [facility] OR;  Service: Vascular;  Laterality: Right;    PENILE PROSTHESIS IMPLANT     PERIPHERAL INTRAVASCULAR LITHOTRIPSY Left 11/19/2022   Procedure: INTRAVASCULAR LITHOTRIPSY;  Surgeon: Nada Libman, MD;  Location: [facility];  Service: Cardiovascular;  Laterality: Left;   PERIPHERAL VASCULAR ATHERECTOMY  08/20/2022   Procedure: PERIPHERAL VASCULAR ATHERECTOMY;  Surgeon: Nada Libman, MD;  Location: [facility];  Service: Cardiovascular;;  popliteal   PERIPHERAL VASCULAR BALLOON ANGIOPLASTY  01/08/2022   Procedure: PERIPHERAL VASCULAR BALLOON ANGIOPLASTY;  Surgeon: Yates Decamp, MD;  Location: [facility];  Service: Cardiovascular;;   PERIPHERAL VASCULAR INTERVENTION Left 11/19/2022   Procedure: PERIPHERAL VASCULAR INTERVENTION;  Surgeon: Nada Libman, MD;  Location: [facility];  Service: Cardiovascular;  Laterality: Left;   WRIST SURGERY     Patient Active Problem List   Diagnosis Date Noted   Shock circulatory (HCC) 02/06/2023   Lumbar compression fracture, closed, initial encounter (HCC) 01/05/2023   Alcohol dependence (HCC) 01/05/2023   Sinus tachycardia 10/08/2022   PAD (peripheral artery disease) (HCC) 04/26/2022   Hypertension 04/25/2022   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) 04/25/2022   HLD (hyperlipidemia) 04/03/2022   Claudication in peripheral vascular disease (HCC) 12/31/2021   Extensor tendon disruption 06/19/2020   Traumatic tear of supraspinatus tendon, right, initial encounter 12/02/2018   Rib contusion, right, initial encounter 12/02/2018   Trigger finger, acquired 06/29/2018   Mass of finger of left hand 11/01/2017   Closed fracture of first metacarpal bone  of left hand with routine healing, subsequent encounter 09/21/2017    PCP: Doreen Beam  REFERRING PROVIDER: Norton Blizzard, MD  REFERRING DIAG: L sided lumbar radiculopathy  Rationale for Evaluation and Treatment: Rehabilitation  THERAPY DIAG:  Muscle spasm of back  Other low back pain  Muscle weakness  (generalized)  S/P kyphoplasty  ONSET DATE: 5 weeks ago  SUBJECTIVE:                                                                                                                                                                                           SUBJECTIVE STATEMENT:  I'm doing better, back is still tight but its better.  I was in the hospital with lightheadedness with labored breathing and BP was really low 70s/40s, CT and EKG were fine and I went home in about 6.5 hours. Still light headed and having an MRI for my back. BP at home have been more normal since then. Estim really helped when we did it about a couple weeks ago. I need to leave early today due to the [employer] man coming if that's OK.   PERTINENT HISTORY:  L 1 compression fx with kyphoplasty in January, recovered completely  PAIN:  re you having pain? Yes: NPRS scale: 0/10 Pain location: midline lumbar  Pain description: soreness, tight Aggravating factors: sitting or standing too long, being still  Relieving factors: light stretching  PRECAUTIONS: None  RED FLAGS: None   WEIGHT BEARING RESTRICTIONS: No  FALLS:  Has patient fallen in last 6 months? No  LIVING ENVIRONMENT: Lives with: lives alone Lives in: House/apartment Stairs: No Has following equipment at home: None  OCCUPATION: driving trucks delivering car parts  PLOF: Independent  PATIENT GOALS: get rid of this pain and be able to function and work  NEXT MD VISIT:   OBJECTIVE:   DIAGNOSTIC FINDINGS:  Radiographs lumbar :  IMPRESSION: 1. Slight interval vertebral body height loss at L2, concerning for a new compression fracture of indeterminate acuity. 2. Redemonstrated compression deformity of L1, now status post kyphoplasty.     Electronically Signed   By: Wiliam Ke M.D.   On: 06/19/2023 11:13      Res   PATIENT SURVEYS:  Modified Oswestry 17/50 34%   SCREENING FOR RED FLAGS: Bowel or bladder incontinence:  No Spinal tumors: No Cauda equina syndrome: No Compression fracture: Yes: old L1 com fx from fall in jan healed, ? New comp fx L2 Abdominal aneurysm: No  COGNITION: Overall cognitive status: Within functional limits for tasks assessed     SENSATION: WFL   POSTURE: rounded shoulders, decreased lumbar lordosis, increased thoracic kyphosis,  posterior pelvic tilt, and flexed trunk   PALPATION: Point tender B lower lumbar multifidi, also  Segmental glides lower thoracic and lumbar spine non painful  LUMBAR ROM:   AROM eval  Flexion Fingertips to floor 6"  Extension 30p!  Right lateral flexion 60  Left lateral flexion 60  Right rotation nt  Left rotation nt   (Blank rows = not tested)  LOWER EXTREMITY ROM:   all Wnl    LOWER EXTREMITY MMT:  all burst testing wnl, able to heel and toe walk I    LUMBAR SPECIAL TESTS:  Straight leg raise test: Negative, Single leg stance test: Negative, and FABER test: Negative  FUNCTIONAL TESTS:  5 times sit to stand: 12  GAIT: Distance walked: over 100 ' in clinic   TODAY'S TREATMENT:                                                                                                                              DATE:   08/28/23  TherEx  BUEs/BLEs L5 x6 minutes  Encouraged use of personal TENS- he does have home unit, would be great to use regularly since electricity really helped his pain historically Bridges x12 PPT x12 with 3 second holds PPT + march x12 B Walking bridges x12 PPT + double bent leg raise x12 QL stretch forwards 3x30 seconds    08/11/23: Therex: Nustep 6 min Ue's and LE's level 5 Inst in supine hip flexor stretch off table  Manual: Sidelying  for muscle energy for lumbar flex, rot. SB restriction, 2 bouts on each side R side lying for lumbar extension, R side bending, rotation Supine for positional release for B PSOAS and iliacus Trigger Point Dry-Needling  Treatment instructions: Expect mild to moderate  muscle soreness. S/S of pneumothorax if dry needled over a lung field, and to seek immediate medical attention should they occur. Patient verbalized understanding of these instructions and education. Patient Consent Given: Yes  Muscles treated: R multifidus, B gluteus maximus Treatment response/outcome: Twitch Response Elicited   815/24 NuStep L5 x56mins  Seated rows 45# 2x12 Lat pulls down 35# 2x12 BlackTB ext 2x10 S2S on airex w/ chest press 2x10 STM to lumbar paraspinals with thera gun  08/04/23 NuStep L5 x56mins  On MH, LE stretches and feet on pball rotations, K2C, small bridges STM to lumbar paraspinals  Seated rows 45# 2x10 Lat pulls down 35# 2x10 BlackTB ext 2x10 Shoulder ext 10# 2x10    07/28/23 NuStep L5 x 5 min STM to lumbar para spinales S2S OHP yellow ball 2x10 AR Press 15lb x10 each  Shoulder Ext 10lb 2x15 HS curls 35lb 2x12 Leg Ext 10lb 2x12 Supine bridges x10 LE on Pball bridges, K2C, Oblq HS, piriformis, and K2C stretch  07/23/23:  Trigger Point Dry-Needling  Treatment instructions: Expect mild to moderate muscle soreness. S/S of pneumothorax if dry needled over a lung field, and to seek immediate medical attention should they occur. Patient verbalized understanding  of these instructions and education. Patient Consent Given: Yes Education handout provided: No Muscles treated: L4, L5 multifidus, iliocostalisR, multifidus L    Treatment response/outcome: Twitch Response Elicited   Educated in seated theraband hip and ER to fire multifidus, to utilize in truck with sitting   PATIENT EDUCATION:  Education details: POC goals Person educated: Patient Education method: Programmer, multimedia, Facilities manager, and Actor cues Education comprehension: verbalized understanding, returned demonstration, and verbal cues required  HOME EXERCISE PROGRAM: TBD  ASSESSMENT:  CLINICAL IMPRESSION:  [name] arrives today doing OK, needed to leave early again today due to [employer]  coming for internet installations. Focused on core and hip strengthening in our available time today, also really encouraged use of his personal TENS unit so that we can progress strength and flexibility here moving forward.   OBJECTIVE IMPAIRMENTS: decreased activity tolerance, decreased ROM, decreased strength, hypomobility, impaired flexibility, improper body mechanics, postural dysfunction, and pain.   ACTIVITY LIMITATIONS: lifting, sitting, and sleeping  PARTICIPATION LIMITATIONS: community activity and occupation  PERSONAL FACTORS: Behavior pattern, Past/current experiences, Time since onset of injury/illness/exacerbation, and 1 comorbidity: previous L1 compression fx  are also affecting patient's functional outcome.   REHAB POTENTIAL: Good  CLINICAL DECISION MAKING: Stable/uncomplicated  EVALUATION COMPLEXITY: Low   GOALS: Goals reviewed with patient? Yes  SHORT TERM GOALS: Target date: 2 weeks 08/06/23  I HEP Baseline: Goal status: INITIAL   LONG TERM GOALS: Target date: 09/16/23  Modified oswestry 10/50 Baseline: 17/50 Goal status: INITIAL  2.  Improve ROM LS for extension from 30 to 70 degrees without pain Baseline:  Goal status: INITIAL  3.  Resolve pain with palpation lower lumbar paraspinals Baseline: very tender/painful Goal status: INITIAL   PLAN:  PT FREQUENCY: 1-2x/week  PT DURATION: 8 weeks  PLANNED INTERVENTIONS: Therapeutic exercises, Therapeutic activity, Neuromuscular re-education, Balance training, Gait training, Patient/Family education, Self Care, and Joint mobilization.  PLAN FOR NEXT SESSION: how was dry needling, advance with LS progressive extension ex, stabilization, cardiovascular, other modalities as needed. Core strength   Nedra Hai, PT, DPT 08/28/23 3:57 PM

## 2023-08-31 DIAGNOSIS — E1165 Type 2 diabetes mellitus with hyperglycemia: Secondary | ICD-10-CM | POA: Diagnosis not present

## 2023-09-03 ENCOUNTER — Telehealth: Payer: Self-pay

## 2023-09-03 ENCOUNTER — Ambulatory Visit: Payer: Medicare HMO | Admitting: Physical Therapy

## 2023-09-03 NOTE — Telephone Encounter (Signed)
Attempted to reach pt regarding his after visit questionnaire. Pt did not answer. I have left him a VM and a f/u MyChart message.

## 2023-09-05 ENCOUNTER — Other Ambulatory Visit: Payer: Self-pay

## 2023-09-05 DIAGNOSIS — I10 Essential (primary) hypertension: Secondary | ICD-10-CM | POA: Diagnosis not present

## 2023-09-05 DIAGNOSIS — E1165 Type 2 diabetes mellitus with hyperglycemia: Secondary | ICD-10-CM | POA: Diagnosis not present

## 2023-09-05 DIAGNOSIS — I739 Peripheral vascular disease, unspecified: Secondary | ICD-10-CM

## 2023-09-05 DIAGNOSIS — Z299 Encounter for prophylactic measures, unspecified: Secondary | ICD-10-CM | POA: Diagnosis not present

## 2023-09-08 ENCOUNTER — Telehealth: Payer: Self-pay

## 2023-09-08 NOTE — Telephone Encounter (Signed)
Transition Care Management Follow-up Telephone Call Date of discharge and from where: [facility] 8/31 How have you been since you were released from the hospital? Doing well and has been following up with Providers Any questions or concerns? No  Items Reviewed: Did the pt receive and understand the discharge instructions provided? Yes  Medications obtained and verified? Yes  Other? No  Any new allergies since your discharge? No  Dietary orders reviewed? Yes Do you have support at home? No    Follow up appointments reviewed: PCP Hospital f/u appt confirmed? Yes  Scheduled to see  on  @ . Specialist Hospital f/u appt confirmed? Yes  Scheduled to see  on  @  Are transportation arrangements needed? No If their condition worsens, is the pt aware to call PCP or go to the Emergency Dept.? Yes Was the patient provided with contact information for the PCP's office or ED? Yes Was to pt encouraged to call back with questions or concerns? Yes

## 2023-09-08 NOTE — Telephone Encounter (Signed)
Transition Care Management Unsuccessful Follow-up Telephone Call  Date of discharge and from where:  Shawn Daniels 8/31  Attempts:  1st Attempt  Reason for unsuccessful TCM follow-up call:  No answer/busy   Lenard Forth Banquete  [facility], [facility] Guide, Phone: [phone] Website: [email or web address]

## 2023-09-10 ENCOUNTER — Ambulatory Visit: Payer: Medicare HMO | Admitting: Physical Therapy

## 2023-09-15 ENCOUNTER — Ambulatory Visit: Payer: Medicare HMO | Admitting: Physical Therapy

## 2023-09-15 ENCOUNTER — Encounter: Payer: Self-pay | Admitting: Physical Therapy

## 2023-09-15 DIAGNOSIS — Z9889 Other specified postprocedural states: Secondary | ICD-10-CM

## 2023-09-15 DIAGNOSIS — M5459 Other low back pain: Secondary | ICD-10-CM | POA: Diagnosis not present

## 2023-09-15 DIAGNOSIS — M62830 Muscle spasm of back: Secondary | ICD-10-CM | POA: Diagnosis not present

## 2023-09-15 DIAGNOSIS — M6281 Muscle weakness (generalized): Secondary | ICD-10-CM

## 2023-09-15 NOTE — Therapy (Signed)
` OUTPATIENT PHYSICAL THERAPY THORACOLUMBAR TREATMENT   Patient Name: Shawn Daniels MRN: [id number] DOB:1956-09-17, 67 y.o., male Today's Date: 09/15/2023  END OF SESSION:  PT End of Session - 09/15/23 1517     Visit Number 7    Date for PT Re-Evaluation 09/17/23    PT Start Time 1515    PT Stop Time 1600    PT Time Calculation (min) 45 min    Activity Tolerance Patient tolerated treatment well    Behavior During Therapy [facility] for tasks assessed/performed                Past Medical History:  Diagnosis Date   Diabetes mellitus without complication (HCC)    GERD (gastroesophageal reflux disease)    Hypertension    Peripheral vascular disease (HCC)    Past Surgical History:  Procedure Laterality Date   ABDOMINAL AORTOGRAM W/LOWER EXTREMITY Right 08/20/2022   Procedure: ABDOMINAL AORTOGRAM W/LOWER EXTREMITY;  Surgeon: Nada Libman, MD;  [facility];  Service: Cardiovascular;  Laterality: Right;   ABDOMINAL AORTOGRAM W/LOWER EXTREMITY N/A 11/19/2022   Procedure: ABDOMINAL AORTOGRAM W/LOWER EXTREMITY;  Surgeon: Nada Libman, MD;  [facility];  Service: Cardiovascular;  Laterality: N/A;   ENDARTERECTOMY FEMORAL Right 04/26/2022   Procedure: RIGHT FEMORAL ENDARTERECTOMY;  Surgeon: Nada Libman, MD;  Location: [facility];  Service: Vascular;  Laterality: Right;   IR KYPHO LUMBAR INC FX REDUCE BONE BX UNI/BIL CANNULATION INC/IMAGING  01/13/2023   IR RADIOLOGIST EVAL & MGMT  01/29/2023   KNEE SURGERY     LOWER EXTREMITY ANGIOGRAPHY N/A 01/01/2022   Procedure: LOWER EXTREMITY ANGIOGRAPHY;  Surgeon: Elder Negus, MD;  [facility];  Service: Cardiovascular;  Laterality: N/A;   PATCH ANGIOPLASTY Right 04/26/2022   Procedure: PATCH ANGIOPLASTY OF RIGHT FEMORAL ARTERY USING XENOSURE BOVINE PERCARDIUM PATCH;  Surgeon: Nada Libman, MD;  [facility]: [facility];  Service: Vascular;  Laterality: Right;   PENILE PROSTHESIS IMPLANT      PERIPHERAL INTRAVASCULAR LITHOTRIPSY Left 11/19/2022   Procedure: INTRAVASCULAR LITHOTRIPSY;  Surgeon: Nada Libman, MD;  [facility];  Service: Cardiovascular;  Laterality: Left;   PERIPHERAL VASCULAR ATHERECTOMY  08/20/2022   Procedure: PERIPHERAL VASCULAR ATHERECTOMY;  Surgeon: Nada Libman, MD;  [facility];  Service: Cardiovascular;;  popliteal   PERIPHERAL VASCULAR BALLOON ANGIOPLASTY  01/08/2022   Procedure: PERIPHERAL VASCULAR BALLOON ANGIOPLASTY;  Surgeon: Yates Decamp, MD;  [facility];  Service: Cardiovascular;;   PERIPHERAL VASCULAR INTERVENTION Left 11/19/2022   Procedure: PERIPHERAL VASCULAR INTERVENTION;  Surgeon: Nada Libman, MD;  [facility];  Service: Cardiovascular;  Laterality: Left;   WRIST SURGERY     Patient Active Problem List   Diagnosis Date Noted   Shock circulatory (HCC) 02/06/2023   Lumbar compression fracture, closed, initial encounter (HCC) 01/05/2023   Alcohol dependence (HCC) 01/05/2023   Sinus tachycardia 10/08/2022   PAD (peripheral artery disease) (HCC) 04/26/2022   Hypertension 04/25/2022   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) 04/25/2022   HLD (hyperlipidemia) 04/03/2022   Claudication in peripheral vascular disease (HCC) 12/31/2021   Extensor tendon disruption 06/19/2020   Traumatic tear of supraspinatus tendon, right, initial encounter 12/02/2018   Rib contusion, right, initial encounter 12/02/2018   Trigger finger, acquired 06/29/2018   Mass of finger of left hand 11/01/2017   Closed fracture of first metacarpal bone of left hand with routine healing,  subsequent encounter 09/21/2017    PCP: Doreen Beam  REFERRING PROVIDER: Norton Blizzard, MD  REFERRING DIAG: L sided lumbar radiculopathy  Rationale for Evaluation and Treatment: Rehabilitation  THERAPY DIAG:  Muscle spasm of back  Muscle weakness (generalized)  S/P kyphoplasty  Other low back  pain  ONSET DATE: 5 weeks ago  SUBJECTIVE:                                                                                                                                                                                           SUBJECTIVE STATEMENT:  Feeling better but back bothes hm when he stands up any period of time  PERTINENT HISTORY:  L 1 compression fx with kyphoplasty in January, recovered completely  PAIN:  re you having pain? Yes: NPRS scale: 0/10 Pain location: midline lumbar  Pain description: soreness, tight Aggravating factors: sitting or standing too long, being still  Relieving factors: light stretching  PRECAUTIONS: None  RED FLAGS: None   WEIGHT BEARING RESTRICTIONS: No  FALLS:  Has patient fallen in last 6 months? No  LIVING ENVIRONMENT: Lives with: lives alone Lives in: House/apartment Stairs: No Has following equipment at home: None  OCCUPATION: driving trucks delivering car parts  PLOF: Independent  PATIENT GOALS: get rid of this pain and be able to function and work  NEXT MD VISIT:   OBJECTIVE:   DIAGNOSTIC FINDINGS:  Radiographs lumbar :  IMPRESSION: 1. Slight interval vertebral body height loss at L2, concerning for a new compression fracture of indeterminate acuity. 2. Redemonstrated compression deformity of L1, now status post kyphoplasty.     Electronically Signed   By: Wiliam Ke M.D.   On: 06/19/2023 11:13      Res   PATIENT SURVEYS:  Modified Oswestry 17/50 34%   SCREENING FOR RED FLAGS: Bowel or bladder incontinence: No Spinal tumors: No Cauda equina syndrome: No Compression fracture: Yes: old L1 com fx from fall in jan healed, ? New comp fx L2 Abdominal aneurysm: No  COGNITION: Overall cognitive status: Within functional limits for tasks assessed     SENSATION: WFL   POSTURE: rounded shoulders, decreased lumbar lordosis, increased thoracic kyphosis, posterior pelvic tilt, and flexed trunk    PALPATION: Point tender B lower lumbar multifidi, also  Segmental glides lower thoracic and lumbar spine non painful  LUMBAR ROM:   AROM eval  Flexion Fingertips to floor 6"  Extension 30p!  Right lateral flexion 60  Left lateral flexion 60  Right rotation nt  Left rotation nt   (Blank rows = not tested)  LOWER EXTREMITY ROM:   all Wnl    LOWER  EXTREMITY MMT:  all burst testing wnl, able to heel and toe walk I    LUMBAR SPECIAL TESTS:  Straight leg raise test: Negative, Single leg stance test: Negative, and FABER test: Negative  FUNCTIONAL TESTS:  5 times sit to stand: 12  GAIT: Distance walked: over 100 ' in clinic   TODAY'S TREATMENT:                                                                                                                              DATE:  09/15/23 NuStep L5 x 6 min S2S OHP yellow ball 2x10 Horiz abd green 2x10 Seated rows 45# 2x12 Lat pulls down 35# 2x12 Shoulder Ext 15lb 2x12 STM thera gun to lumbar spine  08/28/23  TherEx  BUEs/BLEs L5 x6 minutes  Encouraged use of personal TENS- he does have home unit, would be great to use regularly since electricity really helped his pain historically Bridges x12 PPT x12 with 3 second holds PPT + march x12 B Walking bridges x12 PPT + double bent leg raise x12 QL stretch forwards 3x30 seconds    08/11/23: Therex: Nustep 6 min Ue's and LE's level 5 Inst in supine hip flexor stretch off table  Manual: Sidelying  for muscle energy for lumbar flex, rot. SB restriction, 2 bouts on each side R side lying for lumbar extension, R side bending, rotation Supine for positional release for B PSOAS and iliacus Trigger Point Dry-Needling  Treatment instructions: Expect mild to moderate muscle soreness. S/S of pneumothorax if dry needled over a lung field, and to seek immediate medical attention should they occur. Patient verbalized understanding of these instructions and education. Patient Consent  Given: Yes  Muscles treated: R multifidus, B gluteus maximus Treatment response/outcome: Twitch Response Elicited   815/24 NuStep L5 x65mins  Seated rows 45# 2x12 Lat pulls down 35# 2x12 BlackTB ext 2x10 S2S on airex w/ chest press 2x10 STM to lumbar paraspinals with thera gun  08/04/23 NuStep L5 x14mins  On MH, LE stretches and feet on pball rotations, K2C, small bridges STM to lumbar paraspinals  Seated rows 45# 2x10 Lat pulls down 35# 2x10 BlackTB ext 2x10 Shoulder ext 10# 2x10    07/28/23 NuStep L5 x 5 min STM to lumbar para spinales S2S OHP yellow ball 2x10 AR Press 15lb x10 each  Shoulder Ext 10lb 2x15 HS curls 35lb 2x12 Leg Ext 10lb 2x12 Supine bridges x10 LE on Pball bridges, K2C, Oblq HS, piriformis, and K2C stretch   PATIENT EDUCATION:  Education details: POC goals Person educated: Patient Education method: Programmer, multimedia, Demonstration, and Actor cues Education comprehension: verbalized understanding, returned demonstration, and verbal cues required  HOME EXERCISE PROGRAM: TBD  ASSESSMENT:  CLINICAL IMPRESSION:  [name] arrives today doing OK, needed to leave early again today due to needing to gt back to work. Focused on core and hip strengthening in our available time today, added STM to lumbar spine via thera gun due to request and  palatable tightness. Pt reports today was is last visit and voiced he could continue his care on his own. Pt also reports she has an upcoming appointment for an MRI. Their has been gaps in Pt's attendance due personal reasons.  OBJECTIVE IMPAIRMENTS: decreased activity tolerance, decreased ROM, decreased strength, hypomobility, impaired flexibility, improper body mechanics, postural dysfunction, and pain.   ACTIVITY LIMITATIONS: lifting, sitting, and sleeping  PARTICIPATION LIMITATIONS: community activity and occupation  PERSONAL FACTORS: Behavior pattern, Past/current experiences, Time since onset of  injury/illness/exacerbation, and 1 comorbidity: previous L1 compression fx  are also affecting patient's functional outcome.   REHAB POTENTIAL: Good  CLINICAL DECISION MAKING: Stable/uncomplicated  EVALUATION COMPLEXITY: Low   GOALS: Goals reviewed with patient? Yes  SHORT TERM GOALS: Target date: 2 weeks 08/06/23  I HEP Baseline: Goal status: Met 09/15/23   LONG TERM GOALS: Target date: 09/16/23  Modified oswestry 10/50 Baseline: 17/50 Goal status: INITIAL  2.  Improve ROM LS for extension from 30 to 70 degrees without pain Baseline:  Goal status: INITIAL  3.  Resolve pain with palpation lower lumbar paraspinals Baseline: very tender/painful Goal status: INITIAL   PLAN:  PT FREQUENCY: 1-2x/week  PT DURATION: 8 weeks  PLANNED INTERVENTIONS: Therapeutic exercises, Therapeutic activity, Neuromuscular re-education, Balance training, Gait training, Patient/Family education, Self Care, and Joint mobilization.  PLAN FOR NEXT SESSION: how was dry needling, advance with LS progressive extension ex, stabilization, cardiovascular, other modalities as needed. Core strength   PHYSICAL THERAPY DISCHARGE SUMMARY  Visits from Start of Care: 7  Patient agrees to discharge. Patient goals were not met. Patient is being discharged due to the patient's request.  Cassie Freer, PT, DPT Debroah Baller, PTA 09/15/23 3:18 PM

## 2023-09-16 ENCOUNTER — Ambulatory Visit: Payer: Medicare HMO | Admitting: Physical Therapy

## 2023-09-18 ENCOUNTER — Encounter: Payer: Self-pay | Admitting: Surgery

## 2023-09-22 ENCOUNTER — Inpatient Hospital Stay
Admission: RE | Admit: 2023-09-22 | Discharge: 2023-09-22 | Discharge status: Home / Self Care | Payer: Medicare HMO | Source: Ambulatory Visit | Attending: Family Medicine | Admitting: Family Medicine

## 2023-09-22 DIAGNOSIS — M4856XA Collapsed vertebra, not elsewhere classified, lumbar region, initial encounter for fracture: Secondary | ICD-10-CM | POA: Diagnosis not present

## 2023-09-22 DIAGNOSIS — M545 Low back pain, unspecified: Secondary | ICD-10-CM

## 2023-09-22 DIAGNOSIS — M48061 Spinal stenosis, lumbar region without neurogenic claudication: Secondary | ICD-10-CM | POA: Diagnosis not present

## 2023-09-23 ENCOUNTER — Ambulatory Visit: Payer: Medicare HMO | Admitting: Physical Therapy

## 2023-09-30 ENCOUNTER — Other Ambulatory Visit: Payer: Self-pay

## 2023-09-30 ENCOUNTER — Other Ambulatory Visit: Payer: Self-pay | Admitting: Cardiovascular Disease

## 2023-09-30 DIAGNOSIS — Z13820 Encounter for screening for osteoporosis: Secondary | ICD-10-CM

## 2023-09-30 DIAGNOSIS — E1165 Type 2 diabetes mellitus with hyperglycemia: Secondary | ICD-10-CM | POA: Diagnosis not present

## 2023-09-30 DIAGNOSIS — S32000A Wedge compression fracture of unspecified lumbar vertebra, initial encounter for closed fracture: Secondary | ICD-10-CM

## 2023-09-30 NOTE — Progress Notes (Signed)
MRI reviewed and discussed with patient.  He has new compression fractures at L2 and L4 (L4 especially acute).  His pain is improved compared to when he saw [place].  No need for kypho/vertebroplasty.  Continue conservative treatment.  He needs a DEXA scan asap.  Labs also - PTH, 25-OH Vit D, SPEP.  Instructed him to see me after all these tests so we can discuss osteoporosis treatment.

## 2023-10-01 ENCOUNTER — Ambulatory Visit: Payer: Medicare HMO

## 2023-10-06 ENCOUNTER — Ambulatory Visit (HOSPITAL_BASED_OUTPATIENT_CLINIC_OR_DEPARTMENT_OTHER)
Admission: RE | Admit: 2023-10-06 | Discharge: 2023-10-06 | Disposition: A | Discharge status: Home / Self Care | Payer: Medicare HMO | Source: Ambulatory Visit | Attending: Family Medicine | Admitting: Family Medicine

## 2023-10-06 DIAGNOSIS — S32000A Wedge compression fracture of unspecified lumbar vertebra, initial encounter for closed fracture: Secondary | ICD-10-CM | POA: Diagnosis not present

## 2023-10-06 DIAGNOSIS — Z13820 Encounter for screening for osteoporosis: Secondary | ICD-10-CM | POA: Insufficient documentation

## 2023-10-07 LAB — VITAMIN D 25 HYDROXY (VIT D DEFICIENCY, FRACTURES): Vit D, 25-Hydroxy: 34.6 ng/mL (ref 30.0–100.0)

## 2023-10-07 LAB — PROTEIN ELECTROPHORESIS, SERUM
A/G Ratio: 1.2 (ref 0.7–1.7)
Albumin ELP: 4.1 g/dL (ref 2.9–4.4)
Alpha 1: 0.3 g/dL (ref 0.0–0.4)
Alpha 2: 0.8 g/dL (ref 0.4–1.0)
Beta: 1.2 g/dL (ref 0.7–1.3)
Gamma Globulin: 1.1 g/dL (ref 0.4–1.8)
Globulin, Total: 3.3 g/dL (ref 2.2–3.9)
Total Protein: 7.4 g/dL (ref 6.0–8.5)

## 2023-10-07 LAB — PARATHYROID HORMONE, INTACT (NO CA): PTH: 23 pg/mL (ref 15–65)

## 2023-10-09 ENCOUNTER — Ambulatory Visit: Payer: Medicare HMO | Admitting: Family Medicine

## 2023-10-09 VITALS — BP 138/88 | Ht 73.0 in | Wt 202.0 lb

## 2023-10-09 DIAGNOSIS — M810 Age-related osteoporosis without current pathological fracture: Secondary | ICD-10-CM | POA: Diagnosis not present

## 2023-10-09 MED ORDER — TYMLOS 3120 MCG/1.56ML ~~LOC~~ SOPN: 80.0000 ug | PEN_INJECTOR | Freq: Every day | SUBCUTANEOUS | 12 refills | Status: DC

## 2023-10-09 MED ORDER — ASSURE ID DUO PRO PEN NEEDLES 31G X 5 MM MISC: 1.0000 | Freq: Every day | 24 refills | Status: DC

## 2023-10-09 NOTE — Progress Notes (Signed)
PCP: Ignatius Specking, MD  Subjective:   HPI: Patient is a 67 y.o. male here for osteoporosis.  Patient returns today to go over his osteoporosis, labs, bone density scan, and discuss treatment Prior treatment: none History of Hip, Spine, or Wrist Fracture: spine and wrist Heart disease or stroke: yes, has coronary artery disease but no MI/CVA in past year Cancer: no Kidney Disease: no Gastric/Peptic Ulcer: no Gastric bypass surgery: no Severe GERD: Yes History of seizures: no Age at Menopause: n/a Calcium intake: none Vitamin D intake: none Hormone replacement therapy: no Smoking history: former - 25 pack year Alcohol: 12 per week Exercise: gym approximately 2x/week Major dental work in past year: no Parents with hip/spine fracture: yes - mother had a hip fracture  He also mentioned having some pain in his left anterior quad.  No acute injury.  Past Medical History:  Diagnosis Date   Diabetes mellitus without complication (HCC)    GERD (gastroesophageal reflux disease)    Hypertension    Peripheral vascular disease (HCC)     Current Outpatient Medications on File Prior to Visit  Medication Sig Dispense Refill   aspirin EC 81 MG tablet Take 1 tablet (81 mg total) by mouth at bedtime. 30 tablet 12   clopidogrel (PLAVIX) 75 MG tablet Take 1 tablet (75 mg total) by mouth daily. 90 tablet 2   Empagliflozin-linaGLIPtin 25-5 MG TABS Take 1 tablet by mouth in the morning.     Evolocumab (REPATHA SURECLICK) 140 MG/ML SOAJ INJECT 1 PEN (140 MG) INTO THE SKIN EVERY 14 DAYS 6 mL 1   fenofibrate (TRICOR) 145 MG tablet Take 1 tablet by mouth once daily 90 tablet 3   HYDROcodone-acetaminophen (NORCO) 5-325 MG tablet Take 1 tablet by mouth every 6 (six) hours as needed. 20 tablet 0   lisinopril (ZESTRIL) 5 MG tablet Take 5 mg by mouth 2 (two) times daily.     Magnesium 400 MG CAPS Take 400 mg by mouth in the morning and at bedtime.     metoprolol succinate (TOPROL-XL) 25 MG 24 hr tablet  Take 1 tablet by mouth once daily 90 tablet 0   nitroGLYCERIN (NITRODUR - DOSED IN MG/24 HR) 0.2 mg/hr patch APPLY 1/4 (ONE-FOURTH) PATCH TO AFFECTED ELBOW, CHANGE DAILY (Patient not taking: Reported on 08/18/2023) 30 patch 1   NOVOLIN 70/30 RELION (70-30) 100 UNIT/ML injection Inject 30 Units into the skin See admin instructions. Inject up to 30u under the skin twice daily, according to sliding scale     omeprazole (PRILOSEC) 20 MG capsule Take 20 mg by mouth daily before breakfast.     rosuvastatin (CRESTOR) 10 MG tablet Take 10 mg by mouth at bedtime.     tiZANidine (ZANAFLEX) 4 MG tablet Take 1 tablet (4 mg total) by mouth 3 (three) times daily as needed for muscle spasms. 30 tablet 1   Current Facility-Administered Medications on File Prior to Visit  Medication Dose Route Frequency Provider Last Rate Last Admin   iodixanol (VISIPAQUE) 320 MG/ML injection    PRN Nada Libman, MD   120 mL at 08/20/22 1145    Past Surgical History:  Procedure Laterality Date   ABDOMINAL AORTOGRAM W/LOWER EXTREMITY Right 08/20/2022   Procedure: ABDOMINAL AORTOGRAM W/LOWER EXTREMITY;  Surgeon: Nada Libman, MD;  [facility];  Service: Cardiovascular;  Laterality: Right;   ABDOMINAL AORTOGRAM W/LOWER EXTREMITY N/A 11/19/2022   Procedure: ABDOMINAL AORTOGRAM W/LOWER EXTREMITY;  Surgeon: Nada Libman, MD;  Location: [facility]  INVASIVE CV LAB;  Service: Cardiovascular;  Laterality: N/A;   ENDARTERECTOMY FEMORAL Right 04/26/2022   Procedure: RIGHT FEMORAL ENDARTERECTOMY;  Surgeon: Nada Libman, MD;  [facility];  Service: Vascular;  Laterality: Right;   IR KYPHO LUMBAR INC FX REDUCE BONE BX UNI/BIL CANNULATION INC/IMAGING  01/13/2023   IR RADIOLOGIST EVAL & MGMT  01/29/2023   KNEE SURGERY     LOWER EXTREMITY ANGIOGRAPHY N/A 01/01/2022   Procedure: LOWER EXTREMITY ANGIOGRAPHY;  Surgeon: Elder Negus, MD;  [facility];  Service: Cardiovascular;  Laterality: N/A;    PATCH ANGIOPLASTY Right 04/26/2022   Procedure: PATCH ANGIOPLASTY OF RIGHT FEMORAL ARTERY USING XENOSURE BOVINE PERCARDIUM PATCH;  Surgeon: Nada Libman, MD;  [facility];  Service: Vascular;  Laterality: Right;   PENILE PROSTHESIS IMPLANT     PERIPHERAL INTRAVASCULAR LITHOTRIPSY Left 11/19/2022   Procedure: INTRAVASCULAR LITHOTRIPSY;  Surgeon: Nada Libman, MD;  [facility];  Service: Cardiovascular;  Laterality: Left;   PERIPHERAL VASCULAR ATHERECTOMY  08/20/2022   Procedure: PERIPHERAL VASCULAR ATHERECTOMY;  Surgeon: Nada Libman, MD;  [facility];  Service: Cardiovascular;;  popliteal   PERIPHERAL VASCULAR BALLOON ANGIOPLASTY  01/08/2022   Procedure: PERIPHERAL VASCULAR BALLOON ANGIOPLASTY;  Surgeon: Yates Decamp, MD;  [facility];  Service: Cardiovascular;;   PERIPHERAL VASCULAR INTERVENTION Left 11/19/2022   Procedure: PERIPHERAL VASCULAR INTERVENTION;  Surgeon: Nada Libman, MD;  [facility];  Service: Cardiovascular;  Laterality: Left;   WRIST SURGERY      Allergies  Allergen Reactions   Codeine Itching and Other (See Comments)    Can take/tolerate in lower doses   Keflex [Cephalexin] Other (See Comments)    "Shriveled my skin"   Penicillins Other (See Comments)    Reaction from childhood not recalled    BP 138/88   Ht 6\' 1"  (1.854 m)   Wt 202 lb (91.6 kg)   BMI 26.65 kg/m       No data to display              No data to display              Objective:  Physical Exam:  Gen: NAD, comfortable in exam room   Dexa 10/06/23 T scores: L femur -3.1; R forearm -2.0 Vitamin D 34.6 PTH 23 SPEP normal pattern TSH 1.214 CMP with normal calcium 9.3, Cr 1.11  L knee: No gross deformity, ecchymoses, swelling.  Mild quad atrophy. No TTP currently. FROM with normal strength. Negative ant/post drawers. Negative valgus/varus testing. Negative lachman.  NV intact distally.  Assessment  & Plan:  1. Osteoporosis - Patient is very high risk with prior history of multiple vertebral body fractures, wrist fracture; mother with history of hip fracture.  Would greatly benefit from anabolic medication - only option as a male is tymlos.  Will look into coverage for this medication.  Calcium, vitamin D supplementation.    2. Left quad strain - overuse.  Shown home exercise program for this and IT band.  Total visit time 30 minutes including documentation.

## 2023-10-09 NOTE — Patient Instructions (Addendum)
We will look into tymlos for your osteoporosis - [employer], but also [employer] if our staff knows the coverage for this since you may switch over. Calcium 1200mg  daily. Vitamin D 800 international units daily. Start quad strengthening and IT band stretches.

## 2023-10-15 ENCOUNTER — Ambulatory Visit: Payer: Medicare HMO | Attending: Physician Assistant | Admitting: Cardiology

## 2023-10-15 VITALS — BP 132/78 | HR 80 | Ht 73.0 in | Wt 202.0 lb

## 2023-10-15 DIAGNOSIS — R0609 Other forms of dyspnea: Secondary | ICD-10-CM | POA: Diagnosis not present

## 2023-10-15 DIAGNOSIS — I70213 Atherosclerosis of native arteries of extremities with intermittent claudication, bilateral legs: Secondary | ICD-10-CM

## 2023-10-15 DIAGNOSIS — E119 Type 2 diabetes mellitus without complications: Secondary | ICD-10-CM

## 2023-10-15 DIAGNOSIS — E7800 Pure hypercholesterolemia, unspecified: Secondary | ICD-10-CM

## 2023-10-15 DIAGNOSIS — Z794 Long term (current) use of insulin: Secondary | ICD-10-CM | POA: Diagnosis not present

## 2023-10-15 DIAGNOSIS — I2510 Atherosclerotic heart disease of native coronary artery without angina pectoris: Secondary | ICD-10-CM

## 2023-10-15 DIAGNOSIS — I6529 Occlusion and stenosis of unspecified carotid artery: Secondary | ICD-10-CM | POA: Diagnosis not present

## 2023-10-15 DIAGNOSIS — R5383 Other fatigue: Secondary | ICD-10-CM

## 2023-10-15 DIAGNOSIS — R0602 Shortness of breath: Secondary | ICD-10-CM | POA: Diagnosis not present

## 2023-10-15 NOTE — Progress Notes (Signed)
Cardiology Office Note    Date:  10/15/2023  ID:  Shawn Daniels, Shawn Daniels 1956/05/18, MRN [id number] PCP:  Ignatius Specking, MD  Cardiologist:  Kristeen Miss, MD  Electrophysiologist:  None   Chief Complaint: Hospital follow up   History of Present Illness: .    Shawn Daniels is a 67 y.o. male with visit-pertinent history of elevated coronary calcium score at 1523 in 2022, peripheral vascular disease, type 2 diabetes mellitus, hypertension and hyperlipidemia.  Previously followed by Dr. Jacinto Halim with [facility] cardiology. He had mid right SFA stenosis that was treated with angioplasty in 12/2021, no stent was placed.  Unfortunately his symptoms returned within 3 to 4 weeks.  He then established with Dr. Elease Hashimoto in 03/2022.  With his claudication symptoms not improving he was referred to [facility].  On 04/26/2022 he underwent right iliofemoral endarterectomy with bovine pericardial patch angioplasty for severe claudication with Dr. Myra Gianotti.  On 11/19/2022 he underwent angiography and was found to have an occluded left popliteal artery at the level of the patella, this is treated with crossing the lesion followed by intra-arterial lithotripsy and subsequent DES placement again with Dr. Myra Gianotti.  Patient was admitted twice in January and February for hypoglycemia, lumbar compression fracture then diabetic ketoacidosis, 1/4 blood cultures positive for gram negative bacteremia, treated with ciprofloxacin. On chart review patient had an echocardiogram in 02/07/2023 which indicated LVEF of 60 to 65%, LV with normal function, no RWMA, mild concentric left ventricular hypertrophy, grade 1 diastolic dysfunction RV normal function and size.  No significant valvular abnormalities.  Shawn Daniels was last seen in clinic on 05/07/2023 by Dr. Elease Hashimoto for hyperlipidemia and PAD.  He remained stable from a cardiac perspective, his Toprol XL was increased to 25 mg daily.  On 08/23/2023 Shawn Daniels presented to the emergency room  for increased shortness of breath and lightheadedness.  He was treated by EMS with IV fluids and stated he felt significantly better.  His blood sugar was found to be 265 however anion gap 11.  His urinalysis unremarkable.  His D-dimer was mildly elevated at 1.42, CT angiogram was unremarkable for PE or other acute process.  Today Shawn Daniels presents for follow-up.  He reports that he has had increased fatigue and dyspnea on exertion in the last three months.  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  He denies any chest pain with exertion, lower extremity edema, orthopnea or PND.  He notes he has felt more tired in the last months, he no longer regularly works out as he is too tired after work.  He also endorses intermittent dizziness, not associated with position changes at this time.  He notes back in February after a hospitalization for UTI and DKA he self decreased his lisinopril given dizziness related to orthostasis.  He reports he no longer has any dizziness with position changes it will occur at random lasting for a few seconds, no associated symptoms.  He denies any palpitations or feeling of increased heart rate.  He does note that he has been previously told that he snores at night, he has never been told that he has stopped breathing during sleep.  Labwork independently reviewed: 05/28/2023: Total cholesterol 182, triglycerides 110, HDL 52 and LDL 110 08/23/2023: Hemoglobin 12.6, creatinine 0.9, potassium 4.0, ALT 22, TSH 1.214 ROS: .  Today he denies chest pain, lower extremity edema, palpitations, melena, hematuria, hemoptysis, diaphoresis, weakness, presyncope, syncope, orthopnea, and PND.  All other systems are reviewed and otherwise negative. Studies Reviewed: Marland Kitchen    EKG:  EKG is ordered today, personally reviewed, demonstrating  EKG  Interpretation Date/Time:  Wednesday October 15 2023 15:11:35 EDT Ventricular Rate:  80 PR Interval:  158 QRS Duration:  88 QT Interval:  400 QTC Calculation: 461 R Axis:   -2  Text Interpretation: Normal sinus rhythm with sinus arrhythmia Normal ECG Confirmed by Reather Littler [phone]) on 10/15/2023 3:31:23 PM    CV Studies:   Coronary calcium score 08/16/2021: LM-20 LAD 167 LCx 41 RCA 394. Total Alliston score 1523. MESA database percentile: 97 Ascending and descending thoracic aorta are normal in measurements.  Aortic atherosclerosis. No significant extra cardiac abnormality.     Exercise Sestamibi stress test 10/01/2021: Exercise nuclear stress test was performed using Bruce protocol. Patient reached 5.8 METS, and 87% of age predicted maximum heart rate. Exercise capacity was low. No chest pain reported. Heart rate and hemodynamic response were normal. Stress EKG revealed no ischemic changes. Mildly decreased tracer uptake in inferior myocardium likely due to diaphragmatic attenuation. Normal wall motion and thickening. Stress LVEF calculated 44%, but visually appears 50-55%. Low risk study. Cardiac Studies & Procedures       ECHOCARDIOGRAM  ECHOCARDIOGRAM COMPLETE 02/07/2023  Narrative ECHOCARDIOGRAM REPORT    Patient Name:   Shawn Daniels Date of Exam: 02/07/2023 Medical Rec #:  [id number]          Height:       73.0 in Accession #:    [id number]         Weight:       189.2 lb Date of Birth:  1956/10/14         BSA:          2.101 m Patient Age:    66 years           BP:           149/70 mmHg Patient Gender: M                  HR:           102 bpm. Exam Location:  [facility]  Procedure: 2D Echo, Cardiac Doppler and Color Doppler  Indications:     CHF  History:         Patient has no prior history of Echocardiogram examinations. CHF, PAD, Arrythmias:Tachycardia; Risk Factors:Hypertension, Diabetes and Dyslipidemia.  Sonographer:     Mikki Harbor Referring  Phys:  [id number] FUAD ALESKEROV Diagnosing Phys: Adrian Blackwater  IMPRESSIONS   1. Left ventricular ejection fraction, by estimation, is 60 to 65%. The left ventricle has normal function. The left ventricle has no regional wall motion abnormalities. There is mild concentric left ventricular hypertrophy. Left ventricular diastolic parameters are consistent with Grade I diastolic dysfunction (impaired relaxation). 2. Right ventricular systolic function is normal. The right ventricular size is normal. 3. Left atrial size was mildly dilated. 4. Right atrial size was mildly dilated. 5. The mitral valve is normal in structure. Trivial mitral valve regurgitation. No evidence of mitral stenosis. 6. The aortic valve is normal in structure. Aortic valve regurgitation is not visualized. No aortic stenosis is present. 7. The inferior vena cava is normal in size with greater than 50% respiratory variability, suggesting right atrial pressure of 3 mmHg.  FINDINGS Left Ventricle: Left ventricular ejection fraction, by estimation, is 60  to 65%. The left ventricle has normal function. The left ventricle has no regional wall motion abnormalities. The left ventricular internal cavity size was normal in size. There is mild concentric left ventricular hypertrophy. Left ventricular diastolic parameters are consistent with Grade I diastolic dysfunction (impaired relaxation).  Right Ventricle: The right ventricular size is normal. No increase in right ventricular wall thickness. Right ventricular systolic function is normal.  Left Atrium: Left atrial size was mildly dilated.  Right Atrium: Right atrial size was mildly dilated.  Pericardium: There is no evidence of pericardial effusion.  Mitral Valve: The mitral valve is normal in structure. Trivial mitral valve regurgitation. No evidence of mitral valve stenosis. MV peak gradient, 3.7 mmHg. The mean mitral valve gradient is 1.0 mmHg.  Tricuspid Valve: The tricuspid  valve is normal in structure. Tricuspid valve regurgitation is trivial. No evidence of tricuspid stenosis.  Aortic Valve: The aortic valve is normal in structure. Aortic valve regurgitation is not visualized. No aortic stenosis is present. Aortic valve mean gradient measures 4.0 mmHg. Aortic valve peak gradient measures 6.5 mmHg. Aortic valve area, by VTI measures 2.94 cm.  Pulmonic Valve: The pulmonic valve was normal in structure. Pulmonic valve regurgitation is not visualized. No evidence of pulmonic stenosis.  Aorta: The aortic root is normal in size and structure.  Venous: The inferior vena cava is normal in size with greater than 50% respiratory variability, suggesting right atrial pressure of 3 mmHg.  IAS/Shunts: No atrial level shunt detected by color flow Doppler.   LEFT VENTRICLE PLAX 2D LVIDd:         4.90 cm   Diastology LVIDs:         3.20 cm   LV e' medial:    7.83 cm/s LV PW:         1.10 cm   LV E/e' medial:  7.3 LV IVS:        1.00 cm   LV e' lateral:   10.90 cm/s LVOT diam:     2.20 cm   LV E/e' lateral: 5.2 LV SV:         75 LV SV Index:   35 LVOT Area:     3.80 cm   RIGHT VENTRICLE RV Basal diam:  3.35 cm RV Mid diam:    3.50 cm RV S prime:     17.70 cm/s TAPSE (M-mode): 1.9 cm  LEFT ATRIUM             Index        RIGHT ATRIUM           Index LA diam:        4.20 cm 2.00 cm/m   RA Area:     19.40 cm LA Vol (A2C):   42.9 ml 20.42 ml/m  RA Volume:   57.60 ml  27.41 ml/m LA Vol (A4C):   42.5 ml 20.23 ml/m LA Biplane Vol: 43.7 ml 20.80 ml/m AORTIC VALVE                    PULMONIC VALVE AV Area (Vmax):    3.14 cm     PV Vmax:       1.11 m/s AV Area (Vmean):   2.77 cm     PV Peak grad:  4.9 mmHg AV Area (VTI):     2.94 cm AV Vmax:           127.00 cm/s AV Vmean:          90.100  cm/s AV VTI:            0.253 m AV Peak Grad:      6.5 mmHg AV Mean Grad:      4.0 mmHg LVOT Vmax:         105.00 cm/s LVOT Vmean:        65.600 cm/s LVOT VTI:           0.196 m LVOT/AV VTI ratio: 0.77  AORTA Ao Root diam: 3.80 cm  MITRAL VALVE MV Area (PHT): 3.43 cm    SHUNTS MV Area VTI:   3.76 cm    Systemic VTI:  0.20 m MV Peak grad:  3.7 mmHg    Systemic Diam: 2.20 cm MV Mean grad:  1.0 mmHg MV Vmax:       0.96 m/s MV Vmean:      55.4 cm/s MV Decel Time: 221 msec MV E velocity: 57.10 cm/s MV A velocity: 82.90 cm/s MV E/A ratio:  0.69  Shaukat Khan Electronically signed by Adrian Blackwater Signature Date/Time: 02/07/2023/12:03:02 PM    Final              Current Reported Medications:.    Current Meds  Medication Sig   Abaloparatide (TYMLOS) 3120 MCG/1.56ML SOPN Inject 80 mcg into the skin daily.   aspirin EC 81 MG tablet Take 1 tablet (81 mg total) by mouth at bedtime.   clopidogrel (PLAVIX) 75 MG tablet Take 1 tablet (75 mg total) by mouth daily.   Empagliflozin-linaGLIPtin 25-5 MG TABS Take 1 tablet by mouth in the morning.   fenofibrate (TRICOR) 145 MG tablet Take 1 tablet by mouth once daily   HYDROcodone-acetaminophen (NORCO) 5-325 MG tablet Take 1 tablet by mouth every 6 (six) hours as needed.   Insulin Pen Needle (ASSURE ID DUO PRO PEN NEEDLES) 31G X 5 MM MISC 1 each by Does not apply route daily.   lisinopril (ZESTRIL) 5 MG tablet Take 5 mg by mouth at bedtime.   Magnesium 400 MG CAPS Take 400 mg by mouth in the morning and at bedtime.   metoprolol succinate (TOPROL-XL) 25 MG 24 hr tablet Take 1 tablet by mouth once daily   NOVOLIN 70/30 RELION (70-30) 100 UNIT/ML injection Inject 30 Units into the skin See admin instructions. Inject up to 30u under the skin twice daily, according to sliding scale   omeprazole (PRILOSEC) 20 MG capsule Take 20 mg by mouth daily before breakfast.   rosuvastatin (CRESTOR) 10 MG tablet Take 10 mg by mouth at bedtime.   tiZANidine (ZANAFLEX) 4 MG tablet Take 1 tablet (4 mg total) by mouth 3 (three) times daily as needed for muscle spasms.   Physical Exam:    VS:  BP 132/78   Pulse 80   Ht 6\' 1"   (1.854 m)   Wt 202 lb (91.6 kg)   SpO2 95%   BMI 26.65 kg/m    Wt Readings from Last 3 Encounters:  10/15/23 202 lb (91.6 kg)  10/09/23 202 lb (91.6 kg)  08/23/23 194 lb 0.1 oz (88 kg)    GEN: Well nourished, well developed in no acute distress NECK: No JVD; No carotid bruits CARDIAC: RRR, no murmurs, rubs, gallops RESPIRATORY:  Clear to auscultation without rales, wheezing or rhonchi  ABDOMEN: Soft, non-tender, non-distended EXTREMITIES:  No edema; No acute deformity   Asessement and Plan:.    Coronary calcifications/elevated coronary calcium score/fatigue/DOE: Patient had a coronary calcium score in 07/2021 which indicated LAD 167 and RCA 394,  total Alliston score 1523. He had an echocardiogram in 01/2023 that indicated LVEF of 60 to 65%, no RWMA, grade 1 diastolic dysfunction.Today patient reports that in the last 3 months he has noted that he has new fatigue and dyspnea on exertion.  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 Question if anginal equivalent. He denies chest pain, lower extremity edema, orthopnea or PND.  Reviewed ED precautions.  Check CBC and BMET. Recent TSH was normal. Given elevated coronary calcium score and new exertional fatigue/DOE, I have recommended ischemic evaluation. He did not want to proceed with CT coronary or LHC at this time and preferred to move forward with PET stress testing, please see consent below. As he had no signs or symptoms of acute CHF, will defer echo for now. On Plavix and aspirin per Dr. Myra Gianotti for PVD. Continue lisinopril 5 mg daily and metoprolol succinate 25 mg daily and rosuvastatin 10 mg Monday-Friday. Will send sublingual nitroglycerin as needed for chest discomfort.   Dizziness/Carotid Artery Disease: Patient reports history of orthostatic dizziness earlier in the year, after self dose reduction of  Lisinopril he had improvement. Now he notes occasional dizzy spells lasting a few seconds, not associated with position changes. He denies palpitations, presyncope or syncope. No bruits appreciated on exam. Previously noted in 2022 to have minimal stenosis in the right common carotid artery and moderate mixed plaque in the left carotid artery. Check bilateral carotid dopplers.  If above workup is unremarkable can consider cardiac monitor.   Sleep disordered breathing: Patient reports increased daytime fatigue and endorses nighttime snoring. Recommended sleep study given STOP Bang of 6, patient deferred at this time. Plan to discuss further on follow up.   Hyperlipidemia: Patient notes he was not fasting for lipid panel on 05/28/2023. LDL at 110. He also notes that he has stopped taking Repatha due to cost, last injection two weeks ago. Will plan to check fasting lipid profile tomorrow, LFTs normal in 07/2023. Will also refer back to lipid clinic for assitance with Repatha or to discuss other treatment options. Continue Rosuvastatin 10 mg Monday-Friday.   Hypertension: Blood pressure well controlled today at 132/78. Patient reports he self reduced his Lisinopril earlier this year for orthostatic dizziness. Encouraged to monitor blood pressure at home and bring log on follow up for review. Continue Lisinopril 5 mg daily.   PVD: Patient denies symptoms of claudication. Continues to follow with Dr. Myra Gianotti.   Informed Consent   Shared Decision Making/Informed Consent The risks [chest pain, shortness of breath, cardiac arrhythmias, dizziness, blood pressure fluctuations, myocardial infarction, stroke/transient ischemic attack, nausea, vomiting, allergic reaction, radiation exposure, metallic taste sensation and life-threatening complications (estimated to be 1 in 10,000)], benefits (risk stratification, diagnosing coronary artery disease, treatment guidance) and alternatives of a cardiac PET stress test were  discussed in detail with Shawn Daniels and he agrees to proceed.  Disposition: F/u with Reather Littler, NP in 6-8 weeks.   Signed, Rip Harbour, NP

## 2023-10-15 NOTE — Progress Notes (Deleted)
Cardiology Office Note:    Date:  10/15/2023   ID:  Shawn Daniels, DOB June 18, 1956, MRN [id number]  PCP:  Ignatius Specking, MD   [facility] Providers Cardiologist:  Kristeen Miss, MD { Click to update primary MD,subspecialty MD or APP then REFRESH:1}    Referring MD: Ignatius Specking, MD   No chief complaint on file. ***  History of Present Illness:    Shawn Daniels is a 67 y.o. male with a hx of ***  Past Medical History:  Diagnosis Date   Diabetes mellitus without complication (HCC)    GERD (gastroesophageal reflux disease)    Hypertension    Peripheral vascular disease (HCC)     Past Surgical History:  Procedure Laterality Date   ABDOMINAL AORTOGRAM W/LOWER EXTREMITY Right 08/20/2022   Procedure: ABDOMINAL AORTOGRAM W/LOWER EXTREMITY;  Surgeon: Nada Libman, MD;  Location: [facility];  Service: Cardiovascular;  Laterality: Right;   ABDOMINAL AORTOGRAM W/LOWER EXTREMITY N/A 11/19/2022   Procedure: ABDOMINAL AORTOGRAM W/LOWER EXTREMITY;  Surgeon: Nada Libman, MD;  Location: [facility];  Service: Cardiovascular;  Laterality: N/A;   ENDARTERECTOMY FEMORAL Right 04/26/2022   Procedure: RIGHT FEMORAL ENDARTERECTOMY;  Surgeon: Nada Libman, MD;  Location: [facility];  Service: Vascular;  Laterality: Right;   IR KYPHO LUMBAR INC FX REDUCE BONE BX UNI/BIL CANNULATION INC/IMAGING  01/13/2023   IR RADIOLOGIST EVAL & MGMT  01/29/2023   KNEE SURGERY     LOWER EXTREMITY ANGIOGRAPHY N/A 01/01/2022   Procedure: LOWER EXTREMITY ANGIOGRAPHY;  Surgeon: Elder Negus, MD;  Location: [facility];  Service: Cardiovascular;  Laterality: N/A;   PATCH ANGIOPLASTY Right 04/26/2022   Procedure: PATCH ANGIOPLASTY OF RIGHT FEMORAL ARTERY USING XENOSURE BOVINE PERCARDIUM PATCH;  Surgeon: Nada Libman, MD;  Location: [facility];  Service: Vascular;  Laterality: Right;   PENILE PROSTHESIS IMPLANT     PERIPHERAL INTRAVASCULAR LITHOTRIPSY Left 11/19/2022    Procedure: INTRAVASCULAR LITHOTRIPSY;  Surgeon: Nada Libman, MD;  Location: [facility];  Service: Cardiovascular;  Laterality: Left;   PERIPHERAL VASCULAR ATHERECTOMY  08/20/2022   Procedure: PERIPHERAL VASCULAR ATHERECTOMY;  Surgeon: Nada Libman, MD;  Location: [facility];  Service: Cardiovascular;;  popliteal   PERIPHERAL VASCULAR BALLOON ANGIOPLASTY  01/08/2022   Procedure: PERIPHERAL VASCULAR BALLOON ANGIOPLASTY;  Surgeon: Yates Decamp, MD;  Location: [facility];  Service: Cardiovascular;;   PERIPHERAL VASCULAR INTERVENTION Left 11/19/2022   Procedure: PERIPHERAL VASCULAR INTERVENTION;  Surgeon: Nada Libman, MD;  Location: [facility];  Service: Cardiovascular;  Laterality: Left;   WRIST SURGERY      Current Medications: No outpatient medications have been marked as taking for the 10/15/23 encounter (Appointment) with Marcelino Duster, PA.     Allergies:   Codeine, Keflex [cephalexin], and Penicillins   Social History   Socioeconomic History   Marital status: Married    Spouse name: Not on file   Number of children: 0   Years of education: Not on file   Highest education level: Not on file  Occupational History   Occupation: deliver truck parts  Tobacco Use   Smoking status: Former    Current packs/day: 0.00    Average packs/day: 1.5 packs/day for 30.0 years (45.0 ttl pk-yrs)    Types: Cigarettes, Cigars    Start date: 83    Quit date: 2009    Years since quitting: 15.8   Smokeless tobacco: Never  Vaping Use   Vaping status: Never Used  Substance and Sexual Activity   Alcohol use: Yes    Alcohol/week: 3.0 standard drinks of alcohol    Types: 3 Cans of beer per week    Comment: daily   Drug use: Never   Sexual activity: Not on file  Other Topics Concern   Not on file  Social History Narrative   Not on file   Social Determinants of Health   Financial Resource Strain: Not on file  Food Insecurity: No Food Insecurity  (02/06/2023)   Hunger Vital Sign    Worried About Running Out of Food in the Last Year: Never true    Ran Out of Food in the Last Year: Never true  Transportation Needs: No Transportation Needs (02/06/2023)   PRAPARE - Administrator, Civil Service (Medical): No    Lack of Transportation (Non-Medical): No  Physical Activity: Not on file  Stress: Not on file  Social Connections: Not on file     Family History: The patient's ***family history includes Heart failure in his father.  ROS:   Please see the history of present illness.    *** All other systems reviewed and are negative.  EKGs/Labs/Other Studies Reviewed:    The following studies were reviewed today: ***      Recent Labs: 02/07/2023: Magnesium 2.1 08/23/2023: ALT 22; BUN 20; Creatinine, Ser 0.90; Hemoglobin 12.6; Platelets 125; Potassium 4.0; Sodium 135; TSH 1.214  Recent Lipid Panel    Component Value Date/Time   CHOL 146 04/22/2023 0842   TRIG 88 04/22/2023 0842   HDL 70 04/22/2023 0842   CHOLHDL 2.1 04/22/2023 0842   LDLCALC 60 04/22/2023 0842     Risk Assessment/Calculations:   {Does this patient have ATRIAL FIBRILLATION?[phone]}  No BP recorded.  {Refresh Note OR Click here to enter BP  :1}***         Physical Exam:    VS:  There were no vitals taken for this visit.    Wt Readings from Last 3 Encounters:  10/09/23 202 lb (91.6 kg)  08/23/23 194 lb 0.1 oz (88 kg)  08/20/23 196 lb (88.9 kg)     GEN: *** Well nourished, well developed in no acute distress HEENT: Normal NECK: No JVD; No carotid bruits LYMPHATICS: No lymphadenopathy CARDIAC: ***RRR, no murmurs, rubs, gallops RESPIRATORY:  Clear to auscultation without rales, wheezing or rhonchi  ABDOMEN: Soft, non-tender, non-distended MUSCULOSKELETAL:  No edema; No deformity  SKIN: Warm and dry NEUROLOGIC:  Alert and oriented x 3 PSYCHIATRIC:  Normal affect   ASSESSMENT:    No diagnosis found. PLAN:    In order of problems  listed above:  ***      {Are you ordering a CV Procedure (e.g. stress test, cath, DCCV, TEE, etc)?   Press F2        :[id number]}    Medication Adjustments/Labs and Tests Ordered: Current medicines are reviewed at length with the patient today.  Concerns regarding medicines are outlined above.  No orders of the defined types were placed in this encounter.  No orders of the defined types were placed in this encounter.   There are no Patient Instructions on file for this visit.   Signed, Marcelino Duster, [place]  10/15/2023 10:13 AM    [facility]

## 2023-10-15 NOTE — Patient Instructions (Addendum)
Medication Instructions:  No changes *If you need a refill on your cardiac medications before your next appointment, please call your pharmacy*   Lab Work: CBC, BMP, and Fasting Lipids If you have labs (blood work) drawn today and your tests are completely normal, you will receive your results only by: MyChart Message (if you have MyChart) OR A paper copy in the mail If you have any lab test that is abnormal or we need to change your treatment, we will call you to review the results.   Testing/Procedures:  Your physician has requested that you have a carotid duplex. This test is an ultrasound of the carotid arteries in your neck. It looks at blood flow through these arteries that supply the brain with blood. Allow one hour for this exam. There are no restrictions or special instructions.  How to Prepare for Your Cardiac PET:  1. Please do not take these medications before your test:   Medications that may interfere with the cardiac pharmacological stress agent (ex. nitrates - including erectile dysfunction medications, isosorbide mononitrate, tamulosin or beta-blockers) the day of the exam. (Erectile dysfunction medication should be held for at least 72 hrs prior to test) Theophylline containing medications for 12 hours. Dipyridamole 48 hours prior to the test. Your remaining medications may be taken with water.  2. Nothing to eat or drink, except water, 3 hours prior to arrival time.   NO caffeine/decaffeinated products, or chocolate 12 hours prior to arrival.  3. NO perfume, cologne or lotion on chest or abdomen area.          - FEMALES - Please avoid wearing dresses to this appointment.  4. Total time is 1 to 2 hours; you may want to bring reading material for the waiting time.  5. Please report to Radiology at the [facility] Main Entrance 30 minutes early for your test.  [address] [place], [place] [address]  6. Please report to Radiology at [facility] Main Entrance, medical mall, 30 mins prior to your test.  [address]  [place], [place]  [phone]  Diabetic Preparation:  Hold oral medications. You may take NPH and Lantus insulin. Do not take Humalog or Humulin R (Regular Insulin) the day of your test. Check blood sugars prior to leaving the house. If able to eat breakfast prior to 3 hour fasting, you may take all medications, including your insulin, Do not worry if you miss your breakfast dose of insulin - start at your next meal. Patients who wear a continuous glucose monitor MUST remove the device prior to scanning.  IF YOU THINK YOU MAY BE PREGNANT, OR ARE NURSING PLEASE INFORM THE TECHNOLOGIST.  In preparation for your appointment, medication and supplies will be purchased.  Appointment availability is limited, so if you need to cancel or reschedule, please call the Radiology Department at [phone] [facility]) OR [phone] [facility])  24 hours in advance to avoid a cancellation fee of $100.00  What to Expect After you Arrive:  Once you arrive and check in for your appointment, you will be taken to a preparation room within the Radiology Department.  A technologist or Nurse will obtain your medical history, verify that you are correctly prepped for the exam, and explain the procedure.  Afterwards,  an IV will be started in your arm and electrodes will be placed on your skin for EKG monitoring during the stress portion of the exam. Then you will be escorted to the  PET/CT scanner.  There, staff will get you positioned on the scanner and obtain a blood pressure and EKG.  During the exam, you will continue to be connected to the EKG and blood pressure machines.  A small, safe amount of a radioactive tracer will be injected in your IV to obtain a series of pictures of your heart along with an injection of a stress agent.    After your Exam:  It is recommended that you eat a meal and drink a  caffeinated beverage to counter act any effects of the stress agent.  Drink plenty of fluids for the remainder of the day and urinate frequently for the first couple of hours after the exam.  Your doctor will inform you of your test results within 7-10 business days.  For more information and frequently asked questions, please visit our website : [email or web address]  For questions about your test or how to prepare for your test, please call: Cardiac Imaging Nurse Navigators Office: [phone]    Follow-Up: At [facility], you and your health needs are our priority.  As part of our continuing mission to provide you with exceptional heart care, we have created designated Provider Care Teams.  These Care Teams include your primary Cardiologist (physician) and Advanced Practice Providers (APPs -  Physician Assistants and Nurse Practitioners) who all work together to provide you with the care you need, when you need it.  We recommend signing up for the patient portal called "MyChart".  Sign up information is provided on this After Visit Summary.  MyChart is used to connect with patients for Virtual Visits (Telemedicine).  Patients are able to view lab/test results, encounter notes, upcoming appointments, etc.  Non-urgent messages can be sent to your provider as well.   To learn more about what you can do with MyChart, go to [email or web address].    Your next appointment:  December 6th at 3:00 with Reather Littler NP

## 2023-10-16 DIAGNOSIS — I2510 Atherosclerotic heart disease of native coronary artery without angina pectoris: Secondary | ICD-10-CM | POA: Diagnosis not present

## 2023-10-16 LAB — CBC
Hematocrit: 40.5 % (ref 37.5–51.0)
Hemoglobin: 13.1 g/dL (ref 13.0–17.7)
MCH: 31.9 pg (ref 26.6–33.0)
MCHC: 32.3 g/dL (ref 31.5–35.7)
MCV: 99 fL — ABNORMAL HIGH (ref 79–97)
Platelets: 162 10*3/uL (ref 150–450)
RBC: 4.11 x10E6/uL — ABNORMAL LOW (ref 4.14–5.80)
RDW: 12.9 % (ref 11.6–15.4)
WBC: 5.1 10*3/uL (ref 3.4–10.8)

## 2023-10-16 LAB — BASIC METABOLIC PANEL
BUN/Creatinine Ratio: 17 (ref 10–24)
BUN: 13 mg/dL (ref 8–27)
CO2: 23 mmol/L (ref 20–29)
Calcium: 9.6 mg/dL (ref 8.6–10.2)
Chloride: 100 mmol/L (ref 96–106)
Creatinine, Ser: 0.76 mg/dL (ref 0.76–1.27)
Glucose: 135 mg/dL — ABNORMAL HIGH (ref 70–99)
Potassium: 4.4 mmol/L (ref 3.5–5.2)
Sodium: 139 mmol/L (ref 134–144)
eGFR: 100 mL/min/{1.73_m2} (ref >59)

## 2023-10-16 LAB — LIPID PANEL
Chol/HDL Ratio: 3.1 ratio (ref 0.0–5.0)
Cholesterol, Total: 151 mg/dL (ref 100–199)
HDL: 48 mg/dL (ref >39)
LDL Chol Calc (NIH): 62 mg/dL (ref 0–99)
Triglycerides: 256 mg/dL — ABNORMAL HIGH (ref 0–149)
VLDL Cholesterol Cal: 41 mg/dL — ABNORMAL HIGH (ref 5–40)

## 2023-10-17 ENCOUNTER — Encounter: Payer: Self-pay | Admitting: Family Medicine

## 2023-10-17 ENCOUNTER — Encounter: Payer: Self-pay | Admitting: Cardiology

## 2023-10-17 ENCOUNTER — Telehealth: Payer: Self-pay

## 2023-10-17 MED ORDER — NITROGLYCERIN 0.4 MG SL SUBL
0.4000 mg | SUBLINGUAL_TABLET | SUBLINGUAL | 2 refills | Status: DC | PRN
Start: 2023-10-17 — End: 2023-10-29

## 2023-10-17 NOTE — Telephone Encounter (Signed)
-----   Message from Rip Harbour sent at 10/17/2023 10:30 AM EDT ----- Please let Shawn Daniels know that his CBC shows no evidence of current anemia nor infection. His kidney function and electrolytes are normal. His LDL is at goal, triglycerides are elevated. Follow up with pharm D lipid clinic as planned and proceed with testing as planned.

## 2023-10-17 NOTE — Addendum Note (Signed)
Addended by: Clotilde Dieter on: 10/17/2023 07:58 AM   Modules accepted: Orders

## 2023-10-17 NOTE — Telephone Encounter (Signed)
Patient was transferred to my phone gave him the results of his labs as advised by [facility]. No questions asked.

## 2023-10-17 NOTE — Telephone Encounter (Signed)
Called patient on below Left message on machine.

## 2023-10-27 ENCOUNTER — Other Ambulatory Visit: Payer: Self-pay | Admitting: *Deleted

## 2023-10-27 ENCOUNTER — Encounter: Payer: Self-pay | Admitting: *Deleted

## 2023-10-27 ENCOUNTER — Encounter: Payer: Self-pay | Admitting: Student

## 2023-10-27 ENCOUNTER — Ambulatory Visit: Payer: Medicare HMO | Attending: Cardiovascular Disease | Admitting: Student

## 2023-10-27 DIAGNOSIS — E7800 Pure hypercholesterolemia, unspecified: Secondary | ICD-10-CM | POA: Diagnosis not present

## 2023-10-27 MED ORDER — EZETIMIBE 10 MG PO TABS: 10.0000 mg | ORAL_TABLET | Freq: Every day | ORAL | 3 refills | Status: DC

## 2023-10-27 MED ORDER — ASSURE ID DUO PRO PEN NEEDLES 31G X 5 MM MISC: 1.0000 | Freq: Every day | 24 refills | Status: AC

## 2023-10-27 MED ORDER — TYMLOS 3120 MCG/1.56ML ~~LOC~~ SOPN: 80.0000 ug | PEN_INJECTOR | Freq: Every day | SUBCUTANEOUS | 12 refills | Status: AC

## 2023-10-27 MED ORDER — ICOSAPENT ETHYL 1 G PO CAPS: 2.0000 g | ORAL_CAPSULE | Freq: Two times a day (BID) | ORAL | 11 refills | Status: DC

## 2023-10-27 NOTE — Progress Notes (Unsigned)
Patient ID: Shawn Daniels                 DOB: 04-18-1956                    MRN: [id number]      HPI: Shawn Daniels is a 67 y.o. male patient referred to lipid clinic by K West,NP. PMH is significant for hypertension, PAD.    pt has stopped taking Repatha due to cost, last injection two weeks ago.  Patient presented today for for lipid clinic, reports his last lipid lab reflects LDL level while him being on Rosuvastatin 10 mg Monday-Friday, Tricor 145 mg daily and Repatha 140 mg every 14 days. He is in coverage gap so he can not afford Repatha we discussed [employer]. We will enroll him in that. We discussed risk factors and LDL and TG goals. We reviewed Vascepa and its prevention benefits along with TG lowering effect. We reviewed ezetimibe and its side effect and its LDLc lowering effect  We also discussed significance of healthy diet and regular exercise to optimize TG and LDLc level   Current Medications: Rosuvastatin 10 mg Monday-Friday, Tricor 145 mg daily  Intolerances: none  Risk Factors: PAD, CAD,CAC score 1523, diabetes   LDL goal: <55 mg/dl  Last lab on 06/21/1600: TG 256, LDLc 62, TC 151 while on Repatha, Crestor and Tricor Diet: eat fast food - twice  a week   Exercise: compression fracture - walking at gym trying to get personal trainer   Family History:  Relation Problem Comments  Mother (Deceased at age 56)   Father (Deceased at age 38) Heart failure      Social History:  Alcohol: 4 beers per week  Smoking: none  Labs: Lipid Panel     Component Value Date/Time   CHOL 151 10/16/2023 0910   TRIG 256 (H) 10/16/2023 0910   HDL 48 10/16/2023 0910   CHOLHDL 3.1 10/16/2023 0910   LDLCALC 62 10/16/2023 0910   LABVLDL 41 (H) 10/16/2023 0910    Past Medical History:  Diagnosis Date   Diabetes mellitus without complication (HCC)    GERD (gastroesophageal reflux disease)    Hypertension    Peripheral vascular disease (HCC)     Current Outpatient  Medications on File Prior to Visit  Medication Sig Dispense Refill   aspirin EC 81 MG tablet Take 1 tablet (81 mg total) by mouth at bedtime. 30 tablet 12   clopidogrel (PLAVIX) 75 MG tablet Take 1 tablet (75 mg total) by mouth daily. 90 tablet 2   Empagliflozin-linaGLIPtin 25-5 MG TABS Take 1 tablet by mouth in the morning.     Evolocumab (REPATHA SURECLICK) 140 MG/ML SOAJ INJECT 1 PEN (140 MG) INTO THE SKIN EVERY 14 DAYS (Patient not taking: Reported on 10/15/2023) 6 mL 1   HYDROcodone-acetaminophen (NORCO) 5-325 MG tablet Take 1 tablet by mouth every 6 (six) hours as needed. 20 tablet 0   lisinopril (ZESTRIL) 5 MG tablet Take 5 mg by mouth at bedtime.     Magnesium 400 MG CAPS Take 400 mg by mouth in the morning and at bedtime.     metoprolol succinate (TOPROL-XL) 25 MG 24 hr tablet Take 1 tablet by mouth once daily 90 tablet 0   nitroGLYCERIN (NITROSTAT) 0.4 MG SL tablet Place 1 tablet (0.4 mg total) under the tongue every 5 (five) minutes as needed for chest pain. 25 tablet 2   NOVOLIN 70/30 RELION (70-30) 100 UNIT/ML injection  Inject 30 Units into the skin See admin instructions. Inject up to 30u under the skin twice daily, according to sliding scale     omeprazole (PRILOSEC) 20 MG capsule Take 20 mg by mouth daily before breakfast.     rosuvastatin (CRESTOR) 10 MG tablet Take 10 mg by mouth at bedtime.     tiZANidine (ZANAFLEX) 4 MG tablet Take 1 tablet (4 mg total) by mouth 3 (three) times daily as needed for muscle spasms. 30 tablet 1   Current Facility-Administered Medications on File Prior to Visit  Medication Dose Route Frequency Provider Last Rate Last Admin   iodixanol (VISIPAQUE) 320 MG/ML injection    PRN Nada Libman, MD   120 mL at 08/20/22 1145    Allergies  Allergen Reactions   Codeine Itching and Other (See Comments)    Can take/tolerate in lower doses   Keflex [Cephalexin] Other (See Comments)    "Shriveled my skin"   Penicillins Other (See Comments)    Reaction  from childhood not recalled    Assessment/Plan:  1. Hyperlipidemia -  Problem  Hld (Hyperlipidemia)   HLD (hyperlipidemia) Assessment/Plan:  LDL goal: <55  mg/dl last LDLc  62 mg/dl while on Rosuvastatin 10 mg Monday-Friday, Tricor 145 mg daily and Repatha 140 mg Q14D Tolerates current medications well without side effects- Repatha cost prohibitive; will enroll him in [facility]  LDL still above goal on current therapy so we will add ezetimibe 10 mg daily and to optimize TG level  replace Tricor to Vascepa 2 gm twice daily  Patient to start following heart healthy diet and exercise regularly  Follow up lab in 2-3 months     Thank you,  Carmela Hurt, Pharm.D [facility] [address] [place], [place] [address]  Phone: [phone]; Fax: [phone]

## 2023-10-27 NOTE — Assessment & Plan Note (Signed)
Assessment/Plan:  LDL goal: <55  mg/dl last LDLc  62 mg/dl while on Rosuvastatin 10 mg Monday-Friday, Tricor 145 mg daily and Repatha 140 mg Q14D Tolerates current medications well without side effects- Repatha cost prohibitive; will enroll him in [facility]  LDL still above goal on current therapy so we will add ezetimibe 10 mg daily and to optimize TG level  replace Tricor to Vascepa 2 gm twice daily  Patient to start following heart healthy diet and exercise regularly  Follow up lab in 2-3 months

## 2023-10-27 NOTE — Patient Instructions (Addendum)
Your Results:             Your most recent labs Goal  Total Cholesterol 151 < 200  Triglycerides 256 < 150  HDL (happy/good cholesterol) 48 > 40  LDL (lousy/bad cholesterol 62 < 55   Medication changes: Stop taking Tricor, start taking Vascepa 2 gm twice daily, ezetimibe 10 mg daily,continue taking Repatha every 14 days and rosuvastatin 10 mg 5 days a week. We will enroll you in [facility] to get assistance for Repatha co-pay. Share co-pay card info via MyChart.  Other lifestyle interventions you need to follow to lower TG are: Exercise regularly  Limit alcohol  Lower carb intake/ processed food and achieve better blood glucose control    Lab orders: We want to repeat labs after 2-3 months.  We will send you a lab order to remind you once we get closer to that time.

## 2023-10-28 ENCOUNTER — Ambulatory Visit: Payer: Medicare HMO | Admitting: Physician Assistant

## 2023-10-29 ENCOUNTER — Telehealth: Payer: Self-pay | Admitting: Pharmacist

## 2023-10-29 MED ORDER — NITROGLYCERIN 0.4 MG SL SUBL: 0.4000 mg | SUBLINGUAL_TABLET | SUBLINGUAL | 2 refills | Status: DC | PRN

## 2023-10-29 NOTE — Telephone Encounter (Signed)
Enrolled patient in [facility] to lower Repatha cost.   Card No. [id number]  BIN [id number]  PCN [id number]  PC Group [id number]  [employer] ID [id number]  Patient informed via MyChart.

## 2023-10-29 NOTE — Addendum Note (Signed)
Addended by: Virl Axe, Hibba Schram L on: 10/29/2023 11:04 AM   Modules accepted: Orders

## 2023-10-31 DIAGNOSIS — E1165 Type 2 diabetes mellitus with hyperglycemia: Secondary | ICD-10-CM | POA: Diagnosis not present

## 2023-11-07 ENCOUNTER — Other Ambulatory Visit (HOSPITAL_COMMUNITY): Payer: Self-pay

## 2023-11-26 NOTE — Progress Notes (Unsigned)
   Cardiology Office Note    Date:  11/26/2023  ID:  Shawn Daniels, DOB Jul 08, 1956, MRN [id number] PCP:  Ignatius Specking, MD  Cardiologist:  Kristeen Miss, MD  Electrophysiologist:  None   Chief Complaint: ***  History of Present Illness: .    Shawn Daniels is a 67 y.o. male with visit-pertinent history of ***  Labwork independently reviewed:   ROS: .    Please see the history of present illness. Otherwise, review of systems is positive for ***.  All other systems are reviewed and otherwise negative.  Studies Reviewed: Marland Kitchen    EKG:  EKG is ordered today, personally reviewed, demonstrating ***  CV Studies: Cardiac studies reviewed are outlined and summarized above. Otherwise please see EMR for full report.   Current Reported Medications:.    No outpatient medications have been marked as taking for the 11/28/23 encounter (Appointment) with Rip Harbour, NP.    Physical Exam:    VS:  There were no vitals taken for this visit.   Wt Readings from Last 3 Encounters:  10/15/23 202 lb (91.6 kg)  10/09/23 202 lb (91.6 kg)  08/23/23 194 lb 0.1 oz (88 kg)    GEN: Well nourished, well developed in no acute distress NECK: No JVD; No carotid bruits CARDIAC: ***RRR, no murmurs, rubs, gallops RESPIRATORY:  Clear to auscultation without rales, wheezing or rhonchi  ABDOMEN: Soft, non-tender, non-distended EXTREMITIES:  No edema; No acute deformity   Asessement and Plan:.     ***     Disposition: F/u with ***  Signed, Rip Harbour, NP

## 2023-11-28 ENCOUNTER — Ambulatory Visit: Payer: Medicare HMO | Admitting: Cardiology

## 2023-11-30 DIAGNOSIS — E1165 Type 2 diabetes mellitus with hyperglycemia: Secondary | ICD-10-CM | POA: Diagnosis not present

## 2023-12-03 ENCOUNTER — Other Ambulatory Visit: Payer: Self-pay | Admitting: Cardiovascular Disease

## 2023-12-03 DIAGNOSIS — E7800 Pure hypercholesterolemia, unspecified: Secondary | ICD-10-CM

## 2023-12-03 DIAGNOSIS — I739 Peripheral vascular disease, unspecified: Secondary | ICD-10-CM

## 2023-12-03 DIAGNOSIS — I70213 Atherosclerosis of native arteries of extremities with intermittent claudication, bilateral legs: Secondary | ICD-10-CM

## 2023-12-08 ENCOUNTER — Telehealth (HOSPITAL_COMMUNITY): Payer: Self-pay | Admitting: Emergency Medicine

## 2023-12-08 NOTE — Telephone Encounter (Signed)
Attempted to call patient regarding upcoming cardiac PET appointment. Left message on voicemail with name and callback number Aidan Moten RN Navigator Cardiac Imaging [facility] [phone] Office [phone] Cell  

## 2023-12-09 ENCOUNTER — Encounter (HOSPITAL_COMMUNITY): Admission: RE | Admit: 2023-12-09 | Payer: Medicare HMO | Source: Ambulatory Visit

## 2023-12-25 ENCOUNTER — Other Ambulatory Visit: Payer: Self-pay | Admitting: Cardiovascular Disease

## 2023-12-26 ENCOUNTER — Encounter (HOSPITAL_COMMUNITY): Payer: Self-pay

## 2023-12-28 NOTE — Progress Notes (Deleted)
   Cardiology Office Note    Date:  12/28/2023  ID:  Shawn Daniels, Shawn Daniels [birth year], MRN [id number] PCP:  Shawn Leta NOVAK, MD  Cardiologist:  Shawn Passe, MD  Electrophysiologist:  None   Chief Complaint: ***  History of Present Illness: .    Shawn Daniels is a 68 y.o. male with visit-pertinent history of ***  Labwork independently reviewed:   ROS: .    Please see the history of present illness. Otherwise, review of systems is positive for ***.  All other systems are reviewed and otherwise negative.  Studies Reviewed: SABRA    EKG:  EKG is ordered today, personally reviewed, demonstrating ***  CV Studies: Cardiac studies reviewed are outlined and summarized above. Otherwise please see EMR for full report.   Current Reported Medications:.    No outpatient medications have been marked as taking for the 12/31/23 encounter (Appointment) with Corbyn Wildey D, NP.    Physical Exam:    VS:  There were no vitals taken for this visit.   Wt Readings from Last 3 Encounters:  10/15/23 202 lb (91.6 kg)  10/09/23 202 lb (91.6 kg)  08/23/23 194 lb 0.1 oz (88 kg)    GEN: Well nourished, well developed in no acute distress NECK: No JVD; No carotid bruits CARDIAC: ***RRR, no murmurs, rubs, gallops RESPIRATORY:  Clear to auscultation without rales, wheezing or rhonchi  ABDOMEN: Soft, non-tender, non-distended EXTREMITIES:  No edema; No acute deformity   Asessement and Plan:.     ***     Disposition: F/u with ***  Signed, Shawn Vallecillo D Brean Carberry, NP

## 2023-12-30 ENCOUNTER — Telehealth (HOSPITAL_COMMUNITY): Payer: Self-pay | Admitting: Emergency Medicine

## 2023-12-30 NOTE — Telephone Encounter (Signed)

## 2023-12-30 NOTE — Telephone Encounter (Signed)
Attempted to call patient regarding upcoming cardiac PET appointment. Left message on voicemail with name and callback number Aidan Moten RN Navigator Cardiac Imaging [facility] [phone] Office [phone] Cell  

## 2023-12-31 ENCOUNTER — Encounter (HOSPITAL_COMMUNITY): Admission: RE | Admit: 2023-12-31 | Payer: Medicare Other | Source: Ambulatory Visit

## 2023-12-31 ENCOUNTER — Ambulatory Visit: Payer: Medicare Other | Admitting: Cardiology

## 2023-12-31 DIAGNOSIS — E1165 Type 2 diabetes mellitus with hyperglycemia: Secondary | ICD-10-CM | POA: Diagnosis not present

## 2024-01-02 ENCOUNTER — Ambulatory Visit: Payer: Medicare Other | Admitting: Cardiology

## 2024-01-16 ENCOUNTER — Telehealth: Payer: Self-pay | Admitting: Cardiovascular Disease

## 2024-01-16 NOTE — Telephone Encounter (Signed)
Pt c/o medication issue:  1. Name of Medication: Jardiance   2. How are you currently taking this medication (dosage and times per day)?   3. Are you having a reaction (difficulty breathing--STAT)?   4. What is your medication issue? Patient is requesting call back to discuss this medication and possibly getting assistance with payments for this medication. Please advise.

## 2024-01-19 ENCOUNTER — Telehealth: Payer: Self-pay | Admitting: Pharmacy Technician

## 2024-01-19 ENCOUNTER — Encounter: Payer: Self-pay | Admitting: Pharmacy Technician

## 2024-01-19 ENCOUNTER — Other Ambulatory Visit (HOSPITAL_COMMUNITY): Payer: Self-pay

## 2024-01-19 DIAGNOSIS — M9905 Segmental and somatic dysfunction of pelvic region: Secondary | ICD-10-CM | POA: Diagnosis not present

## 2024-01-19 DIAGNOSIS — M9903 Segmental and somatic dysfunction of lumbar region: Secondary | ICD-10-CM | POA: Diagnosis not present

## 2024-01-19 DIAGNOSIS — M9902 Segmental and somatic dysfunction of thoracic region: Secondary | ICD-10-CM | POA: Diagnosis not present

## 2024-01-19 DIAGNOSIS — M9901 Segmental and somatic dysfunction of cervical region: Secondary | ICD-10-CM | POA: Diagnosis not present

## 2024-01-19 DIAGNOSIS — M5442 Lumbago with sciatica, left side: Secondary | ICD-10-CM | POA: Diagnosis not present

## 2024-01-19 DIAGNOSIS — M5387 Other specified dorsopathies, lumbosacral region: Secondary | ICD-10-CM | POA: Diagnosis not present

## 2024-01-19 NOTE — Telephone Encounter (Signed)
Patient Advocate Encounter   The patient was approved for a [employer] grant that will help cover the cost of jardiance Total amount awarded, 10,000.  Effective: 12/20/23 - 12/18/24   WUJ:[id number] NWG:[id number] QMVHQ:[id number] WU:[id number]   Pharmacy provided with approval and processing information and now there is no charge. Patient informed via mychart

## 2024-01-26 ENCOUNTER — Ambulatory Visit (INDEPENDENT_AMBULATORY_CARE_PROVIDER_SITE_OTHER)
Admission: RE | Admit: 2024-01-26 | Discharge: 2024-01-26 | Disposition: A | Discharge status: Home / Self Care | Payer: Medicare Other | Source: Ambulatory Visit | Attending: Surgery | Admitting: Surgery

## 2024-01-26 ENCOUNTER — Encounter: Payer: Self-pay | Admitting: Surgery

## 2024-01-26 ENCOUNTER — Ambulatory Visit (HOSPITAL_COMMUNITY)
Admission: RE | Admit: 2024-01-26 | Discharge: 2024-01-26 | Disposition: A | Discharge status: Home / Self Care | Payer: Medicare Other | Source: Ambulatory Visit | Attending: Surgery | Admitting: Surgery

## 2024-01-26 ENCOUNTER — Ambulatory Visit: Payer: Medicare Other | Admitting: Surgery

## 2024-01-26 VITALS — BP 94/64 | HR 89 | Temp 98.3°F | Resp 20 | Ht 73.0 in | Wt 205.0 lb

## 2024-01-26 DIAGNOSIS — I70213 Atherosclerosis of native arteries of extremities with intermittent claudication, bilateral legs: Secondary | ICD-10-CM | POA: Diagnosis not present

## 2024-01-26 DIAGNOSIS — I739 Peripheral vascular disease, unspecified: Secondary | ICD-10-CM

## 2024-01-26 LAB — VAS US ABI WITH/WO TBI
Left ABI: 1.01
Right ABI: 1.26

## 2024-01-26 NOTE — H&P (View-Only) (Signed)
 [facility]  Patient name: Shawn Daniels MRN: [id number] DOB: November 23, 1956 Sex: male   REASON FOR VISIT:    Follow up  HISOTRY OF PRESENT ILLNESS:    Shawn Daniels is a 68 y.o. male who is status post right iliofemoral endarterectomy with bovine pericardial patch angioplasty on 04/2022 for severe claudication.  Intraoperative findings included a nearly occlusive calcific plaque in the common femoral artery extending up into the distal external iliac artery.  The plaque also extended down onto the superficial femoral artery and profundofemoral artery.  A long patch approximately 8 cm was utilized, starting in the proximal common femoral artery and going down onto the superficial femoral artery for approximately 3 cm. HIs claudication symptoms improved, however he still was not satisfied and so on 08/20/2022 he underwent orbital atherectomy and drug-coated balloon angioplasty followed by stenting of the right popliteal artery.  Because he had such good results with the right leg, he wanted the left leg addressed.  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  This was treated with crossing of the lesion followed by intra-arterial lithotripsy and subsequent drug-eluting stent placement.  He has single-vessel runoff via the peroneal artery.  He is back today for follow-up.     He is on dual antiplatelet therapy as well as a statin for hypercholesterolemia.  He is a former smoker.   PAST MEDICAL HISTORY:   Past Medical History:  Diagnosis Date   Diabetes mellitus without complication (HCC)    GERD (gastroesophageal reflux disease)    Hypertension    Peripheral vascular disease (HCC)      FAMILY HISTORY:   Family History  Problem Relation Age of Onset   Heart failure Father     SOCIAL HISTORY:   Social History   Tobacco Use    Smoking status: Former    Current packs/day: 0.00    Average packs/day: 1.5 packs/day for 30.0 years (45.0 ttl pk-yrs)    Types: Cigarettes, Cigars    Start date: 59    Quit date: 2009    Years since quitting: 16.1   Smokeless tobacco: Never  Substance Use Topics   Alcohol use: Yes    Alcohol/week: 3.0 standard drinks of alcohol    Types: 3 Cans of beer per week    Comment: daily     ALLERGIES:   Allergies  Allergen Reactions   Codeine Itching and Other (See Comments)    Can take/tolerate in lower doses   Keflex [Cephalexin] Other (See Comments)    "Shriveled my skin"   Penicillins Other (See Comments)    Reaction from childhood not recalled     CURRENT MEDICATIONS:   Current Outpatient Medications  Medication Sig Dispense Refill   Abaloparatide (TYMLOS) 3120 MCG/1.56ML SOPN Inject 80 mcg into the skin daily. 1.56 mL 12   aspirin EC 81 MG tablet Take 1 tablet (81 mg total) by mouth at bedtime. 30 tablet 12   clopidogrel (PLAVIX) 75 MG tablet Take 1 tablet (75 mg total) by mouth daily. 90 tablet 2   Empagliflozin-linaGLIPtin 25-5 MG TABS Take 1 tablet by mouth in the morning.     Evolocumab (REPATHA SURECLICK) 140 MG/ML SOAJ INJECT 1 PEN (140 MG) SUBCUTANEOUSLY EVERY 14 DAYS 6 mL 3   HYDROcodone-acetaminophen (NORCO) 5-325 MG tablet Take 1 tablet by mouth every 6 (six) hours as needed. 20 tablet 0   icosapent  Ethyl (VASCEPA) 1 g capsule Take 2 capsules (2 g total) by mouth 2 (two) times daily. 120 capsule 11   Insulin Pen Needle (ASSURE ID DUO PRO PEN NEEDLES) 31G X 5 MM MISC 1 each by Does not apply route daily. 30 each 24   lisinopril (ZESTRIL) 5 MG tablet Take 5 mg by mouth at bedtime.     Magnesium 400 MG CAPS Take 400 mg by mouth in the morning and at bedtime.     metoprolol succinate (TOPROL-XL) 25 MG 24 hr tablet Take 1 tablet by mouth once daily 90 tablet 2   NOVOLIN 70/30 RELION (70-30) 100 UNIT/ML injection Inject 30 Units into the skin See admin instructions.  Inject up to 30u under the skin twice daily, according to sliding scale     omeprazole (PRILOSEC) 20 MG capsule Take 20 mg by mouth daily before breakfast.     rosuvastatin (CRESTOR) 10 MG tablet Take 10 mg by mouth at bedtime.     tiZANidine (ZANAFLEX) 4 MG tablet Take 1 tablet (4 mg total) by mouth 3 (three) times daily as needed for muscle spasms. 30 tablet 1   ezetimibe (ZETIA) 10 MG tablet Take 1 tablet (10 mg total) by mouth daily. 90 tablet 3   No current facility-administered medications for this visit.   Facility-Administered Medications Ordered in Other Visits  Medication Dose Route Frequency Provider Last Rate Last Admin   iodixanol (VISIPAQUE) 320 MG/ML injection    PRN Nada Libman, MD   120 mL at 08/20/22 1145    REVIEW OF SYSTEMS:   [X]  denotes positive finding, [ ]  denotes negative finding Cardiac  Comments:  Chest pain or chest pressure:    Shortness of breath upon exertion:    Short of breath when lying flat:    Irregular heart rhythm:        Vascular    Pain in calf, thigh, or hip brought on by ambulation:    Pain in feet at night that wakes you up from your sleep:     Blood clot in your veins:    Leg swelling:         Pulmonary    Oxygen at home:    Productive cough:     Wheezing:         Neurologic    Sudden weakness in arms or legs:     Sudden numbness in arms or legs:     Sudden onset of difficulty speaking or slurred speech:    Temporary loss of vision in one eye:     Problems with dizziness:         Gastrointestinal    Blood in stool:     Vomited blood:         Genitourinary    Burning when urinating:     Blood in urine:        Psychiatric    Major depression:         Hematologic    Bleeding problems:    Problems with blood clotting too easily:        Skin    Rashes or ulcers:        Constitutional    Fever or chills:      PHYSICAL EXAM:   Vitals:   01/26/24 1405  BP: 94/64  Pulse: 89  Resp: 20  Temp: 98.3 F (36.8 C)   SpO2: 93%  Weight: 205 lb (93 kg)  Height: 6\' 1"  (1.854 m)  GENERAL: The patient is a well-nourished male, in no acute distress. The vital signs are documented above. CARDIAC: There is a regular rate and rhythm.  VASCULAR: Easily palpable right dorsalis pedis pulse, the left is harder to feel.  Right carotid bruit PULMONARY: Non-labored respirations MUSCULOSKELETAL: There are no major deformities or cyanosis. NEUROLOGIC: No focal weakness or paresthesias are detected. SKIN: There are no ulcers or rashes noted. PSYCHIATRIC: The patient has a normal affect.  STUDIES:   I have reviewed the following: +-----------+--------+-----+---------------+----------+--------+  LEFT      PSV cm/sRatioStenosis       Waveform  Comments  +-----------+--------+-----+---------------+----------+--------+  CFA Mid    96                          biphasic            +-----------+--------+-----+---------------+----------+--------+  DFA       101                         biphasic            +-----------+--------+-----+---------------+----------+--------+  SFA Prox   504          75-99% stenosisbiphasic            +-----------+--------+-----+---------------+----------+--------+  SFA Mid    69                          biphasic            +-----------+--------+-----+---------------+----------+--------+  SFA Distal 98                          biphasic            +-----------+--------+-----+---------------+----------+--------+  ATA Distal 13                          monophasic          +-----------+--------+-----+---------------+----------+--------+  PTA Distal 34                          triphasic           +-----------+--------+-----+---------------+----------+--------+  PERO Distal66                          biphasic            +-----------+--------+-----+---------------+----------+--------+   Left Stent(s):   +---------------+--------+--------+---------+--------+  popliteal     PSV cm/sStenosisWaveform Comments  +---------------+--------+--------+---------+--------+  Prox to Stent  97              triphasic          +---------------+--------+--------+---------+--------+  Proximal Stent 103             biphasic           +---------------+--------+--------+---------+--------+  Mid Stent      83              biphasic           +---------------+--------+--------+---------+--------+  Distal Stent   79              biphasic           +---------------+--------+--------+---------+--------+  Distal to Stent87              biphasic           +---------------+--------+--------+---------+--------+  Summary:  Left: 75-99% stenosis noted in the superficial femoral artery. Patent  stent with no evidence of stenosis in the popliteal artery.   ABI/TBIToday's ABIToday's TBIPrevious ABIPrevious TBI  +-------+-----------+-----------+------------+------------+  Right 1.26       0.57       1.16        0.60          +-------+-----------+-----------+------------+------------+  Left  1.01       0.55       0.88        0.62          +-------+-----------+-----------+------------+------------+  Triphasic waveforms MEDICAL ISSUES:   PAD: The patient has undergone bilateral interventions.  I have been following an area on ultrasound in the native left superficial femoral artery that has developed progressively worse velocities, concerning for a high-grade lesion.  I discussed this with the patient.  The velocity profile has gone from 402/500 in the last 6 months.  He has not yet had a significant change in his ABIs however I am concerned that this lesion could jeopardize the intervention that we have performed on the left leg which was very challenging.  I have recommended that we proceed with angiography via right femoral approach and intervention on the left SFA  lesion.  This does appear to be heavily calcified based off of prior angiographic imaging and so since it is very focal I will try to treat this with atherectomy and balloon angioplasty or lithotripsy and angioplasty.  I am setting him up for a carotid duplex in 6 months when he returns for surveillance imaging as he had a carotid bruit on exam today    Charlena Cross, MD, FACS [facility] [phone] Pager [phone]

## 2024-01-26 NOTE — Progress Notes (Signed)
[facility]  Patient name: Shawn Daniels MRN: [id number] DOB: November 23, 1956 Sex: male   REASON FOR VISIT:    Follow up  HISOTRY OF PRESENT ILLNESS:    Shawn Daniels is a 68 y.o. male who is status post right iliofemoral endarterectomy with bovine pericardial patch angioplasty on 04/2022 for severe claudication.  Intraoperative findings included a nearly occlusive calcific plaque in the common femoral artery extending up into the distal external iliac artery.  The plaque also extended down onto the superficial femoral artery and profundofemoral artery.  A long patch approximately 8 cm was utilized, starting in the proximal common femoral artery and going down onto the superficial femoral artery for approximately 3 cm. HIs claudication symptoms improved, however he still was not satisfied and so on 08/20/2022 he underwent orbital atherectomy and drug-coated balloon angioplasty followed by stenting of the right popliteal artery.  Because he had such good results with the right leg, he wanted the left leg addressed.  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  This was treated with crossing of the lesion followed by intra-arterial lithotripsy and subsequent drug-eluting stent placement.  He has single-vessel runoff via the peroneal artery.  He is back today for follow-up.     He is on dual antiplatelet therapy as well as a statin for hypercholesterolemia.  He is a former smoker.   PAST MEDICAL HISTORY:   Past Medical History:  Diagnosis Date   Diabetes mellitus without complication (HCC)    GERD (gastroesophageal reflux disease)    Hypertension    Peripheral vascular disease (HCC)      FAMILY HISTORY:   Family History  Problem Relation Age of Onset   Heart failure Father     SOCIAL HISTORY:   Social History   Tobacco Use    Smoking status: Former    Current packs/day: 0.00    Average packs/day: 1.5 packs/day for 30.0 years (45.0 ttl pk-yrs)    Types: Cigarettes, Cigars    Start date: 59    Quit date: 2009    Years since quitting: 16.1   Smokeless tobacco: Never  Substance Use Topics   Alcohol use: Yes    Alcohol/week: 3.0 standard drinks of alcohol    Types: 3 Cans of beer per week    Comment: daily     ALLERGIES:   Allergies  Allergen Reactions   Codeine Itching and Other (See Comments)    Can take/tolerate in lower doses   Keflex [Cephalexin] Other (See Comments)    "Shriveled my skin"   Penicillins Other (See Comments)    Reaction from childhood not recalled     CURRENT MEDICATIONS:   Current Outpatient Medications  Medication Sig Dispense Refill   Abaloparatide (TYMLOS) 3120 MCG/1.56ML SOPN Inject 80 mcg into the skin daily. 1.56 mL 12   aspirin EC 81 MG tablet Take 1 tablet (81 mg total) by mouth at bedtime. 30 tablet 12   clopidogrel (PLAVIX) 75 MG tablet Take 1 tablet (75 mg total) by mouth daily. 90 tablet 2   Empagliflozin-linaGLIPtin 25-5 MG TABS Take 1 tablet by mouth in the morning.     Evolocumab (REPATHA SURECLICK) 140 MG/ML SOAJ INJECT 1 PEN (140 MG) SUBCUTANEOUSLY EVERY 14 DAYS 6 mL 3   HYDROcodone-acetaminophen (NORCO) 5-325 MG tablet Take 1 tablet by mouth every 6 (six) hours as needed. 20 tablet 0   icosapent  Ethyl (VASCEPA) 1 g capsule Take 2 capsules (2 g total) by mouth 2 (two) times daily. 120 capsule 11   Insulin Pen Needle (ASSURE ID DUO PRO PEN NEEDLES) 31G X 5 MM MISC 1 each by Does not apply route daily. 30 each 24   lisinopril (ZESTRIL) 5 MG tablet Take 5 mg by mouth at bedtime.     Magnesium 400 MG CAPS Take 400 mg by mouth in the morning and at bedtime.     metoprolol succinate (TOPROL-XL) 25 MG 24 hr tablet Take 1 tablet by mouth once daily 90 tablet 2   NOVOLIN 70/30 RELION (70-30) 100 UNIT/ML injection Inject 30 Units into the skin See admin instructions.  Inject up to 30u under the skin twice daily, according to sliding scale     omeprazole (PRILOSEC) 20 MG capsule Take 20 mg by mouth daily before breakfast.     rosuvastatin (CRESTOR) 10 MG tablet Take 10 mg by mouth at bedtime.     tiZANidine (ZANAFLEX) 4 MG tablet Take 1 tablet (4 mg total) by mouth 3 (three) times daily as needed for muscle spasms. 30 tablet 1   ezetimibe (ZETIA) 10 MG tablet Take 1 tablet (10 mg total) by mouth daily. 90 tablet 3   No current facility-administered medications for this visit.   Facility-Administered Medications Ordered in Other Visits  Medication Dose Route Frequency Provider Last Rate Last Admin   iodixanol (VISIPAQUE) 320 MG/ML injection    PRN Nada Libman, MD   120 mL at 08/20/22 1145    REVIEW OF SYSTEMS:   [X]  denotes positive finding, [ ]  denotes negative finding Cardiac  Comments:  Chest pain or chest pressure:    Shortness of breath upon exertion:    Short of breath when lying flat:    Irregular heart rhythm:        Vascular    Pain in calf, thigh, or hip brought on by ambulation:    Pain in feet at night that wakes you up from your sleep:     Blood clot in your veins:    Leg swelling:         Pulmonary    Oxygen at home:    Productive cough:     Wheezing:         Neurologic    Sudden weakness in arms or legs:     Sudden numbness in arms or legs:     Sudden onset of difficulty speaking or slurred speech:    Temporary loss of vision in one eye:     Problems with dizziness:         Gastrointestinal    Blood in stool:     Vomited blood:         Genitourinary    Burning when urinating:     Blood in urine:        Psychiatric    Major depression:         Hematologic    Bleeding problems:    Problems with blood clotting too easily:        Skin    Rashes or ulcers:        Constitutional    Fever or chills:      PHYSICAL EXAM:   Vitals:   01/26/24 1405  BP: 94/64  Pulse: 89  Resp: 20  Temp: 98.3 F (36.8 C)   SpO2: 93%  Weight: 205 lb (93 kg)  Height: 6\' 1"  (1.854 m)  GENERAL: The patient is a well-nourished male, in no acute distress. The vital signs are documented above. CARDIAC: There is a regular rate and rhythm.  VASCULAR: Easily palpable right dorsalis pedis pulse, the left is harder to feel.  Right carotid bruit PULMONARY: Non-labored respirations MUSCULOSKELETAL: There are no major deformities or cyanosis. NEUROLOGIC: No focal weakness or paresthesias are detected. SKIN: There are no ulcers or rashes noted. PSYCHIATRIC: The patient has a normal affect.  STUDIES:   I have reviewed the following: +-----------+--------+-----+---------------+----------+--------+  LEFT      PSV cm/sRatioStenosis       Waveform  Comments  +-----------+--------+-----+---------------+----------+--------+  CFA Mid    96                          biphasic            +-----------+--------+-----+---------------+----------+--------+  DFA       101                         biphasic            +-----------+--------+-----+---------------+----------+--------+  SFA Prox   504          75-99% stenosisbiphasic            +-----------+--------+-----+---------------+----------+--------+  SFA Mid    69                          biphasic            +-----------+--------+-----+---------------+----------+--------+  SFA Distal 98                          biphasic            +-----------+--------+-----+---------------+----------+--------+  ATA Distal 13                          monophasic          +-----------+--------+-----+---------------+----------+--------+  PTA Distal 34                          triphasic           +-----------+--------+-----+---------------+----------+--------+  PERO Distal66                          biphasic            +-----------+--------+-----+---------------+----------+--------+   Left Stent(s):   +---------------+--------+--------+---------+--------+  popliteal     PSV cm/sStenosisWaveform Comments  +---------------+--------+--------+---------+--------+  Prox to Stent  97              triphasic          +---------------+--------+--------+---------+--------+  Proximal Stent 103             biphasic           +---------------+--------+--------+---------+--------+  Mid Stent      83              biphasic           +---------------+--------+--------+---------+--------+  Distal Stent   79              biphasic           +---------------+--------+--------+---------+--------+  Distal to Stent87              biphasic           +---------------+--------+--------+---------+--------+  Summary:  Left: 75-99% stenosis noted in the superficial femoral artery. Patent  stent with no evidence of stenosis in the popliteal artery.   ABI/TBIToday's ABIToday's TBIPrevious ABIPrevious TBI  +-------+-----------+-----------+------------+------------+  Right 1.26       0.57       1.16        0.60          +-------+-----------+-----------+------------+------------+  Left  1.01       0.55       0.88        0.62          +-------+-----------+-----------+------------+------------+  Triphasic waveforms MEDICAL ISSUES:   PAD: The patient has undergone bilateral interventions.  I have been following an area on ultrasound in the native left superficial femoral artery that has developed progressively worse velocities, concerning for a high-grade lesion.  I discussed this with the patient.  The velocity profile has gone from 402/500 in the last 6 months.  He has not yet had a significant change in his ABIs however I am concerned that this lesion could jeopardize the intervention that we have performed on the left leg which was very challenging.  I have recommended that we proceed with angiography via right femoral approach and intervention on the left SFA  lesion.  This does appear to be heavily calcified based off of prior angiographic imaging and so since it is very focal I will try to treat this with atherectomy and balloon angioplasty or lithotripsy and angioplasty.  I am setting him up for a carotid duplex in 6 months when he returns for surveillance imaging as he had a carotid bruit on exam today    Charlena Cross, MD, FACS [facility] [phone] Pager [phone]

## 2024-01-27 ENCOUNTER — Telehealth: Payer: Self-pay | Admitting: *Deleted

## 2024-01-27 NOTE — Telephone Encounter (Signed)
 Attempted to call for surgery scheduling. LVM

## 2024-01-31 DIAGNOSIS — E1165 Type 2 diabetes mellitus with hyperglycemia: Secondary | ICD-10-CM | POA: Diagnosis not present

## 2024-02-02 ENCOUNTER — Telehealth: Payer: Self-pay

## 2024-02-02 NOTE — Telephone Encounter (Signed)
 Attempted to call for surgery scheduling. LVM

## 2024-02-06 ENCOUNTER — Other Ambulatory Visit: Payer: Self-pay

## 2024-02-06 DIAGNOSIS — R0989 Other specified symptoms and signs involving the circulatory and respiratory systems: Secondary | ICD-10-CM

## 2024-02-06 DIAGNOSIS — I70213 Atherosclerosis of native arteries of extremities with intermittent claudication, bilateral legs: Secondary | ICD-10-CM

## 2024-02-09 ENCOUNTER — Other Ambulatory Visit: Payer: Self-pay

## 2024-02-09 DIAGNOSIS — I70213 Atherosclerosis of native arteries of extremities with intermittent claudication, bilateral legs: Secondary | ICD-10-CM

## 2024-02-17 ENCOUNTER — Telehealth: Payer: Self-pay | Admitting: Pharmacy Technician

## 2024-02-17 ENCOUNTER — Other Ambulatory Visit (HOSPITAL_COMMUNITY): Payer: Self-pay

## 2024-02-17 NOTE — Telephone Encounter (Signed)
     Card is still active per website but it rejects saying expired when running it. [facility] is closed now. Will call tomorrow. They are open 9am-5pm. Then I will call [facility] and have them to re-run the jardiance.

## 2024-02-18 ENCOUNTER — Other Ambulatory Visit (HOSPITAL_COMMUNITY): Payer: Self-pay

## 2024-02-18 ENCOUNTER — Encounter: Payer: Self-pay | Admitting: Pharmacist

## 2024-02-18 DIAGNOSIS — I428 Other cardiomyopathies: Secondary | ICD-10-CM | POA: Insufficient documentation

## 2024-02-18 NOTE — Telephone Encounter (Signed)
 I called [facility] and they said the medication is up for diag verification. I submitted the form and now we have to wait until they approve it.

## 2024-02-23 DIAGNOSIS — E1159 Type 2 diabetes mellitus with other circulatory complications: Secondary | ICD-10-CM | POA: Diagnosis not present

## 2024-02-23 DIAGNOSIS — I10 Essential (primary) hypertension: Secondary | ICD-10-CM | POA: Diagnosis not present

## 2024-02-23 DIAGNOSIS — M545 Low back pain, unspecified: Secondary | ICD-10-CM | POA: Diagnosis not present

## 2024-02-23 DIAGNOSIS — I739 Peripheral vascular disease, unspecified: Secondary | ICD-10-CM | POA: Diagnosis not present

## 2024-02-23 DIAGNOSIS — E1165 Type 2 diabetes mellitus with hyperglycemia: Secondary | ICD-10-CM | POA: Diagnosis not present

## 2024-02-23 DIAGNOSIS — Z299 Encounter for prophylactic measures, unspecified: Secondary | ICD-10-CM | POA: Diagnosis not present

## 2024-02-23 NOTE — Telephone Encounter (Signed)
 Verification approved. I called [facility] and they re-ran with [facility]. [facility] said they will give him a refund. I called the pt and left the pt a message

## 2024-02-24 ENCOUNTER — Encounter (HOSPITAL_COMMUNITY): Payer: Self-pay | Admitting: Surgery

## 2024-02-24 ENCOUNTER — Ambulatory Visit (HOSPITAL_COMMUNITY)
Admission: RE | Admit: 2024-02-24 | Discharge: 2024-02-24 | Disposition: A | Discharge status: Home / Self Care | Payer: Medicare Other | Source: Ambulatory Visit | Attending: Surgery | Admitting: Surgery

## 2024-02-24 ENCOUNTER — Other Ambulatory Visit: Payer: Self-pay

## 2024-02-24 ENCOUNTER — Encounter (HOSPITAL_COMMUNITY)
Admission: RE | Disposition: A | Discharge status: Home / Self Care | Payer: Self-pay | Source: Ambulatory Visit | Attending: Surgery

## 2024-02-24 DIAGNOSIS — Z79899 Other long term (current) drug therapy: Secondary | ICD-10-CM | POA: Insufficient documentation

## 2024-02-24 DIAGNOSIS — I70213 Atherosclerosis of native arteries of extremities with intermittent claudication, bilateral legs: Secondary | ICD-10-CM | POA: Diagnosis not present

## 2024-02-24 DIAGNOSIS — T82858A Stenosis of vascular prosthetic devices, implants and grafts, initial encounter: Secondary | ICD-10-CM | POA: Insufficient documentation

## 2024-02-24 DIAGNOSIS — E7800 Pure hypercholesterolemia, unspecified: Secondary | ICD-10-CM | POA: Diagnosis not present

## 2024-02-24 DIAGNOSIS — Z8249 Family history of ischemic heart disease and other diseases of the circulatory system: Secondary | ICD-10-CM | POA: Insufficient documentation

## 2024-02-24 DIAGNOSIS — Z87891 Personal history of nicotine dependence: Secondary | ICD-10-CM | POA: Diagnosis not present

## 2024-02-24 HISTORY — PX: PERIPHERAL INTRAVASCULAR LITHOTRIPSY: CATH118324

## 2024-02-24 HISTORY — PX: ABDOMINAL AORTOGRAM W/LOWER EXTREMITY: CATH118223

## 2024-02-24 HISTORY — PX: PERIPHERAL VASCULAR BALLOON ANGIOPLASTY: CATH118281

## 2024-02-24 LAB — POCT I-STAT, CHEM 8
BUN: 13 mg/dL (ref 8–23)
Calcium, Ion: 1.25 mmol/L (ref 1.15–1.40)
Chloride: 97 mmol/L — ABNORMAL LOW (ref 98–111)
Creatinine, Ser: 1 mg/dL (ref 0.61–1.24)
Glucose, Bld: 127 mg/dL — ABNORMAL HIGH (ref 70–99)
HCT: 42 % (ref 39.0–52.0)
Hemoglobin: 14.3 g/dL (ref 13.0–17.0)
Potassium: 3.7 mmol/L (ref 3.5–5.1)
Sodium: 138 mmol/L (ref 135–145)
TCO2: 27 mmol/L (ref 22–32)

## 2024-02-24 LAB — GLUCOSE, CAPILLARY
Glucose-Capillary: 50 mg/dL — ABNORMAL LOW (ref 70–99)
Glucose-Capillary: 56 mg/dL — ABNORMAL LOW (ref 70–99)
Glucose-Capillary: 149 mg/dL — ABNORMAL HIGH (ref 70–99)

## 2024-02-24 LAB — POCT ACTIVATED CLOTTING TIME
Activated Clotting Time: 222 s
Activated Clotting Time: 181 s
Activated Clotting Time: 164 s

## 2024-02-24 SURGERY — ABDOMINAL AORTOGRAM W/LOWER EXTREMITY
Anesthesia: LOCAL | Laterality: Left

## 2024-02-24 MED ORDER — ONDANSETRON HCL 4 MG/2ML IJ SOLN: 4.0000 mg | Freq: Four times a day (QID) | INTRAMUSCULAR | Status: DC | PRN

## 2024-02-24 MED ORDER — MIDAZOLAM HCL 2 MG/2ML IJ SOLN
INTRAMUSCULAR | Status: DC | PRN
  Administered 2024-02-24: 1 mg via INTRAVENOUS
  Administered 2024-02-24: 2 mg via INTRAVENOUS

## 2024-02-24 MED ORDER — HYDROMORPHONE HCL 1 MG/ML IJ SOLN: 0.5000 mg | INTRAMUSCULAR | Status: DC | PRN

## 2024-02-24 MED ORDER — HYDRALAZINE HCL 20 MG/ML IJ SOLN: 5.0000 mg | INTRAMUSCULAR | Status: DC | PRN

## 2024-02-24 MED ORDER — LIDOCAINE HCL (PF) 1 % IJ SOLN
INTRAMUSCULAR | Status: DC | PRN
Start: 2024-02-24 — End: 2024-02-24
  Administered 2024-02-24: 15 mL via INTRADERMAL

## 2024-02-24 MED ORDER — IODIXANOL 320 MG/ML IV SOLN
INTRAVENOUS | Status: DC | PRN
  Administered 2024-02-24: 86 mL

## 2024-02-24 MED ORDER — FENTANYL CITRATE (PF) 100 MCG/2ML IJ SOLN
INTRAMUSCULAR | Status: AC
  Filled 2024-02-24: qty 2

## 2024-02-24 MED ORDER — SODIUM CHLORIDE 0.9 % IV SOLN: INTRAVENOUS | Status: DC

## 2024-02-24 MED ORDER — SODIUM CHLORIDE 0.9 % WEIGHT BASED INFUSION
1.0000 mL/kg/h | INTRAVENOUS | Status: DC
  Administered 2024-02-24: 1 mL/kg/h via INTRAVENOUS

## 2024-02-24 MED ORDER — OXYCODONE HCL 5 MG PO TABS: 5.0000 mg | ORAL_TABLET | ORAL | Status: DC | PRN

## 2024-02-24 MED ORDER — MIDAZOLAM HCL 2 MG/2ML IJ SOLN
INTRAMUSCULAR | Status: AC
  Filled 2024-02-24: qty 2

## 2024-02-24 MED ORDER — LABETALOL HCL 5 MG/ML IV SOLN: 10.0000 mg | INTRAVENOUS | Status: DC | PRN

## 2024-02-24 MED ORDER — HEPARIN SODIUM (PORCINE) 1000 UNIT/ML IJ SOLN
INTRAMUSCULAR | Status: AC
  Filled 2024-02-24: qty 10

## 2024-02-24 MED ORDER — FENTANYL CITRATE (PF) 100 MCG/2ML IJ SOLN
INTRAMUSCULAR | Status: DC | PRN
  Administered 2024-02-24: 50 ug via INTRAVENOUS
  Administered 2024-02-24: 25 ug via INTRAVENOUS

## 2024-02-24 MED ORDER — ACETAMINOPHEN 325 MG PO TABS: 650.0000 mg | ORAL_TABLET | ORAL | Status: DC | PRN

## 2024-02-24 MED ORDER — HEPARIN (PORCINE) IN NACL 1000-0.9 UT/500ML-% IV SOLN
INTRAVENOUS | Status: DC | PRN
  Administered 2024-02-24 (×2): 500 mL

## 2024-02-24 MED ORDER — HEPARIN SODIUM (PORCINE) 1000 UNIT/ML IJ SOLN
INTRAMUSCULAR | Status: DC | PRN
  Administered 2024-02-24: 9000 [IU] via INTRAVENOUS

## 2024-02-24 MED ORDER — ASPIRIN 81 MG PO TBEC: 81.0000 mg | DELAYED_RELEASE_TABLET | Freq: Every day | ORAL | Status: DC

## 2024-02-24 SURGICAL SUPPLY — 19 items
CATH ANGIO 5F BER2 65CM (CATHETERS) IMPLANT
CATH OMNI FLUSH 5F 65CM (CATHETERS) IMPLANT
CATH SHOCKWAVE E8 5X80 (CATHETERS) IMPLANT
COVER DOME SNAP 22 D (MISCELLANEOUS) IMPLANT
DCB RANGER 6.0X80 135 (BALLOONS) IMPLANT
GUIDEWIRE ANGLED .035X150CM (WIRE) IMPLANT
KIT ENCORE 26 ADVANTAGE (KITS) IMPLANT
KIT MICROPUNCTURE NIT STIFF (SHEATH) IMPLANT
KIT SYRINGE INJ CVI SPIKEX1 (MISCELLANEOUS) IMPLANT
RANGER DCB 6.0X80 135 (BALLOONS) ×3 IMPLANT
SET ATX-X65L (MISCELLANEOUS) IMPLANT
SHEATH CATAPULT 6FR 45 (SHEATH) IMPLANT
SHEATH PINNACLE 5F 10CM (SHEATH) IMPLANT
SHEATH PINNACLE 6F 10CM (SHEATH) IMPLANT
SHEATH PROBE COVER 6X72 (BAG) IMPLANT
TRAY PV CATH (CUSTOM PROCEDURE TRAY) ×4 IMPLANT
WIRE BENTSON .035X145CM (WIRE) IMPLANT
WIRE G VAS 035X260 STIFF (WIRE) IMPLANT
WIRE SPARTACORE .014X300CM (WIRE) IMPLANT

## 2024-02-24 NOTE — Inpatient Diabetes Management (Signed)
 Inpatient Diabetes Program Recommendations  AACE/ADA: New Consensus Statement on Inpatient Glycemic Control (2015)  Target Ranges:  Prepandial:   less than 140 mg/dL      Peak postprandial:   less than 180 mg/dL (1-2 hours)      Critically ill patients:  140 - 180 mg/dL   Lab Results  Component Value Date   GLUCAP 149 (H) 02/24/2024   HGBA1C 6.9 (H) 01/06/2023    Review of Glycemic Control  Latest Reference Range & Units 02/24/24 11:54 02/24/24 12:09 02/24/24 13:01  Glucose-Capillary 70 - 99 mg/dL 56 (L) 50 (L) 981 (H)  (L): Data is abnormally low (H): Data is abnormally high Diabetes history: Type 1 DM Outpatient Diabetes medications: Jardiance 25 mg every day, Novolog 8-16 units BID, Tresiba 28 units QD Current orders for Inpatient glycemic control: None  Inpatient Diabetes Program Recommendations:    Noted hypoglycemia this AM of 56 mg/dL.   Consider adding Novolog 0-6 units Q4H if to remain inpatient.   Thanks, Lujean Rave, MSN, RNC-OB Diabetes Coordinator [phone] (8a-5p)

## 2024-02-24 NOTE — Op Note (Signed)
 Patient name: Shawn Daniels MRN: [id number] DOB: 1956-12-10 Sex: male  02/24/2024 Pre-operative Diagnosis: Bilateral claudication Post-operative diagnosis:  Same Surgeon:  Durene Cal Procedure Performed:  1.  Ultrasound-guided access, right femoral artery  2.  Abdominal aortogram  3.  Bilateral lower extremity angiogram  4.  Shockwave intra-arterial lithotripsy, left superficial femoral artery  5.  Drug-coated balloon angioplasty, left superficial femoral artery  6.  Conscious sedation, 67 minutes    Indications: This is a 68 year old gentleman who has undergone bilateral interventions.  His surveillance imaging showed stenosis proximal to his previous interventions.  I felt this was threatening his stent and so he comes in today for angiography with evaluation and possible intervention  Procedure:  The patient was identified in the holding area and taken to room 8.  The patient was then placed supine on the table and prepped and draped in the usual sterile fashion.  A time out was called.  Conscious sedation was administered with the use of IV fentanyl and Versed under continuous physician and nurse monitoring.  Heart rate, blood pressure, and oxygen saturation were continuously monitored.  Total sedation time was .  Ultrasound was used to evaluate the right common femoral artery.  It was patent .  A digital ultrasound image was acquired.  A micropuncture needle was used to access the right common femoral artery under ultrasound guidance.  An 018 wire was advanced without resistance and a micropuncture sheath was placed.  The 018 wire was removed and a benson wire was placed.  The micropuncture sheath was exchanged for a 5 french sheath.  An omniflush catheter was advanced over the wire to the level of L-1.  An abdominal angiogram was obtained.  Next, using the omniflush catheter and a benson wire, the aortic bifurcation was crossed and the catheter was placed into theleft  external iliac artery and left runoff was obtained.  right runoff was performed via retrograde sheath injections.  Findings:   Aortogram: No significant renal artery stenosis was visualized.  The infrarenal abdominal aorta is widely patent.  Bilateral common and external iliac arteries are widely patent.  Bilateral common femorals are widely patent.  Right Lower Extremity: The right common femoral, profundofemoral, and superficial femoral artery are widely patent.  The patch angioplasty in the right groin is widely patent.  Popliteal artery is widely patent.  There is three-vessel runoff however there is a high-grade lesion in the proximal anterior tibial artery.  The posterior tibial and peroneal are the dominant vessels.  Left Lower Extremity: The left common femoral and profundofemoral artery are widely patent.  There is a heavily calcified focal lesion in the proximal superficial femoral artery with greater than 80% stenosis.  The remaining portion of the superficial femoral-popliteal artery including the previous stent and intervention are widely patent.  There is single-vessel runoff via the peroneal artery which reconstitutes the dorsalis pedis and posterior tibial at the ankle  Intervention: After the above images were acquired the decision was made to proceed with intervention.  The patient had a fair amount of scar tissue in the right groin and so in order to cross the bifurcation I had to use an Amplatz wire.  A 6 French 45 cm sheath was inserted into the external iliac artery and the patient was fully heparinized.  I then advanced a [place] core wire across the lesion and then performed shockwave intra-arterial lithotripsy with a 5 mm balloon.  740-second treatments were performed.  I then inserted a  6 x 80 Ranger drug-coated balloon and performed balloon angioplasty for 3 minutes.  Completion imaging revealed resolution of the stenosis.  There is still visualization of the plaque however this was  no longer flow-limiting.  In trying to remove the sheath, I inserted a dilator as well as a Amplatz wire.  Withdrawing the sheath was very difficult.  The sheath actually had been braiding, done.  It came out piecemeal.  I was fortunately able to keep an Amplatz wire in place and so a short 6 [place] sheath was inserted and the patient will have this removed in the holding area  Impression:  #1  Successful lithotripsy and drug-coated angioplasty with a 6 balloon of a focal heavily calcified greater than 80% proximal left superficial femoral artery stenosis  #2  The previous popliteal intervention is widely patent on the left with runoff via the peroneal artery  #3  Right femoral endarterectomy site is widely patent  #4  Focal right anterior tibial greater than 80% stenosis.  The posterior tibial and peroneal are the dominant runoff to the right  #5  Significant scar tissue in the right groin which made getting the sheath in and out very difficult.  The sheath actually fell apart and was very challenging to remove   V. Durene Cal, M.D., [facility] [place] Office: [phone] Pager:  [phone]

## 2024-02-24 NOTE — Progress Notes (Signed)
 Site area: rt groin 48F arterial sheath Site Prior to Removal:  Level 0 Pressure Applied For: 30 minutes Manual:   yes Patient Status During Pull:  stable Post Pull Site:  Level 0 Post Pull Instructions Given:  yes Post Pull Pulses Present: rt dp palpable Dressing Applied:  gauze and tegaderm Bedrest begins @ 1145 Comments:

## 2024-02-24 NOTE — Progress Notes (Signed)
 CBG 56 at 1155. Asymptomatic. Given and drinking regular ginger ale and eating graham crackers. Will recheck in short stay

## 2024-02-24 NOTE — Interval H&P Note (Signed)
 History and Physical Interval Note:  02/24/2024 7:26 AM  Shawn Daniels  has presented today for surgery, with the diagnosis of atheroschlerosis of bilateral extremities with claudication ICD code; I70.213.  The various methods of treatment have been discussed with the patient and family. After consideration of risks, benefits and other options for treatment, the patient has consented to  Procedure(s): ABDOMINAL AORTOGRAM W/LOWER EXTREMITY (Bilateral) as a surgical intervention.  The patient's history has been reviewed, patient examined, no change in status, stable for surgery.  I have reviewed the patient's chart and labs.  Questions were answered to the patient's satisfaction.     Durene Cal

## 2024-02-28 DIAGNOSIS — E1165 Type 2 diabetes mellitus with hyperglycemia: Secondary | ICD-10-CM | POA: Diagnosis not present

## 2024-03-01 ENCOUNTER — Encounter: Payer: Self-pay | Admitting: *Deleted

## 2024-03-21 ENCOUNTER — Other Ambulatory Visit: Payer: Self-pay | Admitting: Cardiovascular Disease

## 2024-03-25 DIAGNOSIS — S32010D Wedge compression fracture of first lumbar vertebra, subsequent encounter for fracture with routine healing: Secondary | ICD-10-CM | POA: Diagnosis not present

## 2024-03-25 DIAGNOSIS — Z6827 Body mass index (BMI) 27.0-27.9, adult: Secondary | ICD-10-CM | POA: Diagnosis not present

## 2024-03-25 DIAGNOSIS — M5440 Lumbago with sciatica, unspecified side: Secondary | ICD-10-CM | POA: Diagnosis not present

## 2024-03-26 ENCOUNTER — Telehealth: Payer: Self-pay

## 2024-03-26 ENCOUNTER — Other Ambulatory Visit (HOSPITAL_COMMUNITY): Payer: Self-pay

## 2024-03-26 ENCOUNTER — Telehealth: Payer: Self-pay | Admitting: Pharmacy Technician

## 2024-03-26 NOTE — Telephone Encounter (Signed)
   Pre-operative Risk Assessment    Patient Name: Shawn Daniels  DOB: June 11, 1956 MRN: [id number]   Date of last office visit: 10/15/23 Date of next office visit: Not scheduled   Request for Surgical Clearance    Procedure:   Left L3-TFESI  Date of Surgery:  Clearance TBD                                Surgeon:  Not listed Surgeon's Group or Practice Name:  [facility] Phone number:  [phone] Fax number:  [phone]   Type of Clearance Requested:   - Medical  - Pharmacy:  Hold Clopidogrel (Plavix) x7 days   Type of Anesthesia:  Not Indicated   Additional requests/questions:    Garrel Ridgel   03/26/2024, 12:37 PM

## 2024-03-26 NOTE — Telephone Encounter (Signed)
   Name: Shawn Daniels  DOB: 1956/01/27  MRN: [id number]  Primary Cardiologist: Kristeen Miss, MD  Chart reviewed as part of pre-operative protocol coverage. Because of Shawn Daniels's past medical history and time since last visit, he will require a follow-up in-office visit in order to better assess preoperative cardiovascular risk.  At patient's previous visit in October 2024 he complained of dyspnea and PET CT was ordered but never completed. Therefore patient would be best severed with in-person visit.   Pre-op covering staff: - Please schedule appointment and call patient to inform them. If patient already had an upcoming appointment within acceptable timeframe, please add "pre-op clearance" to the appointment notes so provider is aware. - Please contact requesting surgeon's office via preferred method (i.e, phone, fax) to inform them of need for appointment prior to surgery.   Carlos Levering, NP  03/26/2024, 12:54 PM

## 2024-03-26 NOTE — Telephone Encounter (Signed)
 Pharmacy Patient Advocate Encounter   Received notification from CoverMyMeds that prior authorization for Repatha is required/requested.   Insurance verification completed.   The patient is insured through [facility] .   Per test claim: PA required; PA submitted to above mentioned insurance via CoverMyMeds Key/confirmation #/EOC [facility] Status is pending

## 2024-03-26 NOTE — Telephone Encounter (Signed)
 Pharmacy Patient Advocate Encounter  Received notification from [facility] that Prior Authorization for Repatha has been APPROVED from 03/26/24 to 03/26/24   PA #/Case ID/Reference #: [id number]

## 2024-03-26 NOTE — Telephone Encounter (Signed)
1st attempt to reach pt regarding surgical clearance and the need for an IN OFFICE appointment.  Left pt a detailed message to call back and get that scheduled.

## 2024-03-29 ENCOUNTER — Other Ambulatory Visit: Payer: Self-pay

## 2024-03-29 DIAGNOSIS — I70213 Atherosclerosis of native arteries of extremities with intermittent claudication, bilateral legs: Secondary | ICD-10-CM

## 2024-03-29 NOTE — Telephone Encounter (Signed)
 Happy to see him. Dr. Elease Hashimoto does have a 7 day hold on Friday 04/02/24 available if he prefers.   Alver Sorrow, NP

## 2024-03-29 NOTE — Telephone Encounter (Signed)
 Pt has been scheduled at [facility] location as no open slots at [facility]  until 04/19/24.Marland Kitchen pt said he needs appt sooner than that. Pt also states he does not want to go to the [facility] office any longer. Pt agreeable to the [facility] location and to see Gillian Shields, NP 04/06/24 @ 8:50. I will update all parties involved.

## 2024-03-29 NOTE — Telephone Encounter (Signed)
 Called pt and offered 04/02/24 with Dr. Elease Hashimoto. Pt agreed that he would like to see Dr. Elease Hashimoto.

## 2024-03-29 NOTE — Telephone Encounter (Signed)
 CORRECT FAX# [phone],

## 2024-03-30 DIAGNOSIS — E1165 Type 2 diabetes mellitus with hyperglycemia: Secondary | ICD-10-CM | POA: Diagnosis not present

## 2024-04-01 ENCOUNTER — Encounter: Payer: Self-pay | Admitting: Cardiovascular Disease

## 2024-04-01 NOTE — Progress Notes (Unsigned)
 Cardiology Office Note:    Date:  04/02/2024   ID:  Shawn Daniels, DOB [birth year], MRN [id number]  PCP:  Ignatius Specking, MD   [facility] Providers Cardiologist:   Margreat Widener     Referring MD: Ignatius Specking, MD   Chief Complaint  Patient presents with   Hyperlipidemia     History of Present Illness:    Shawn Daniels is a 68 y.o. male with a hx of peripheral vascular disease, status post percutaneous angioplasty, coronary artery calcification ( coronary calcium score revealed total score 1523 placing patient in the 97th percentile.  )  Was previously see by [facility]  Had 2 separate PAD procedures but Dr. Nadara Eaton was not able to place a stent .  Had a mid R SFA stenosis, treated with andioplasty  - no stent was placed.  His symptoms returned within 3-4 weeks .  [place] the [place] and thought it was altitude. By his claudication symptoms did not improve with return to [employer] .   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 His last lipid profile from June, 2023 shows a total cholesterol of 188, HDL 62, triglyceride level is 305, LDL is 77  Had left leg stenting by [facility] recently open procedures procedures  He wants to recheck his lipids . If his Trigs   May 07, 2023 Shawn Daniels is seen for follow up of his HLD, PAD,  Blood pressure has been well-controlled.  His last LDL level was 60, HDL is 70, total cholesterol is 146, triglyceride level is 88. Is on repatha , rosuvastatin 5 days a week  HR remains fast. He didn't take his metoprolol at  the higher dose   April 02, 2024 Shawn Daniels is seen for follow up of his HLD, PAD  He has had some dyspnea  in Oct. 2024 and a cardiac PET was ordered but never completed   He is scheduled for a Transforaminal epidural steroid injection into L3.  Will need to hold his Plavix for 7 days  He has never had coronary stenting , he is at low risk from a cardiac standpoint but this is not the current issue at hand  Has had another PV stent a month ago  He will need clearance from vascular surgery given his recent procedure   No CP,   He was having some dyspnea        Past Medical History:  Diagnosis Date   Diabetes mellitus without complication (HCC)    GERD (gastroesophageal reflux disease)    Hypertension    Peripheral vascular disease (HCC)     Past Surgical History:  Procedure Laterality Date   ABDOMINAL AORTOGRAM W/LOWER EXTREMITY Right 08/20/2022   Procedure: ABDOMINAL AORTOGRAM W/LOWER EXTREMITY;  Surgeon: Nada Libman, MD;  Location: [facility];  Service: Cardiovascular;  Laterality: Right;   ABDOMINAL AORTOGRAM W/LOWER EXTREMITY N/A 11/19/2022   Procedure: ABDOMINAL AORTOGRAM W/LOWER EXTREMITY;  Surgeon:  Nada Libman, MD;  Location: [facility];  Service: Cardiovascular;  Laterality: N/A;   ABDOMINAL AORTOGRAM W/LOWER EXTREMITY Bilateral 02/24/2024   Procedure: ABDOMINAL AORTOGRAM W/LOWER EXTREMITY;  Surgeon: Nada Libman, MD;  Location: [facility];  Service: Cardiovascular;  Laterality: Bilateral;   ENDARTERECTOMY FEMORAL Right 04/26/2022   Procedure: RIGHT FEMORAL ENDARTERECTOMY;  Surgeon: Nada Libman, MD;  Location: [facility] OR;  Service: Vascular;  Laterality: Right;   IR KYPHO LUMBAR INC FX REDUCE BONE BX UNI/BIL CANNULATION INC/IMAGING  01/13/2023   IR RADIOLOGIST EVAL & MGMT  01/29/2023   KNEE SURGERY     LOWER EXTREMITY ANGIOGRAPHY N/A 01/01/2022   Procedure: LOWER EXTREMITY ANGIOGRAPHY;  Surgeon: Elder Negus, MD;  Location: [facility]  [facility] LAB;  Service: Cardiovascular;  Laterality: N/A;   PATCH ANGIOPLASTY Right 04/26/2022   Procedure: PATCH ANGIOPLASTY OF RIGHT FEMORAL ARTERY USING XENOSURE BOVINE PERCARDIUM PATCH;  Surgeon: Nada Libman, MD;  Location: [facility] OR;  Service: Vascular;  Laterality: Right;   PENILE PROSTHESIS IMPLANT     PERIPHERAL INTRAVASCULAR LITHOTRIPSY Left 11/19/2022   Procedure: INTRAVASCULAR LITHOTRIPSY;  Surgeon: Nada Libman, MD;  [facility]: [facility];  Service: Cardiovascular;  Laterality: Left;   PERIPHERAL INTRAVASCULAR LITHOTRIPSY Left 02/24/2024   Procedure: PERIPHERAL INTRAVASCULAR LITHOTRIPSY;  Surgeon: Nada Libman, MD;  [facility]: [facility];  Service: Cardiovascular;  Laterality: Left;  LT SFA   PERIPHERAL VASCULAR ATHERECTOMY  08/20/2022   Procedure: PERIPHERAL VASCULAR ATHERECTOMY;  Surgeon: Nada Libman, MD;  [facility]: [facility];  Service: Cardiovascular;;  popliteal   PERIPHERAL VASCULAR BALLOON ANGIOPLASTY  01/08/2022   Procedure: PERIPHERAL VASCULAR BALLOON ANGIOPLASTY;  Surgeon: Yates Decamp, MD;  [facility]: [facility];  Service: Cardiovascular;;   PERIPHERAL VASCULAR BALLOON ANGIOPLASTY  02/24/2024   Procedure: PERIPHERAL VASCULAR BALLOON ANGIOPLASTY;  Surgeon: Nada Libman, MD;  [facility];  Service: Cardiovascular;;  Rt SFA   PERIPHERAL VASCULAR INTERVENTION Left 11/19/2022   Procedure: PERIPHERAL VASCULAR INTERVENTION;  Surgeon: Nada Libman, MD;  [facility]: [facility];  Service: Cardiovascular;  Laterality: Left;   WRIST SURGERY      Current Medications: Current Meds  Medication Sig   Abaloparatide (TYMLOS) 3120 MCG/1.56ML SOPN Inject 80 mcg into the skin daily.   aspirin EC 81 MG tablet Take 1 tablet (81 mg total) by mouth at bedtime.   clopidogrel (PLAVIX) 75 MG tablet Take 1 tablet by mouth once daily   Evolocumab (REPATHA SURECLICK) 140 MG/ML SOAJ INJECT 1 PEN (140 MG) SUBCUTANEOUSLY EVERY 14 DAYS    fenofibrate (TRICOR) 145 MG tablet Take 145 mg by mouth in the morning.   Insulin Pen Needle (ASSURE ID DUO PRO PEN NEEDLES) 31G X 5 MM MISC 1 each by Does not apply route daily.   JARDIANCE 25 MG TABS tablet Take 25 mg by mouth daily.   LANTUS SOLOSTAR 100 UNIT/ML Solostar Pen Inject 36 Units into the skin at bedtime.   lisinopril (ZESTRIL) 5 MG tablet Take 5 mg by mouth in the morning and at bedtime.   Magnesium 400 MG CAPS Take 400 mg by mouth every evening.   metoprolol succinate (TOPROL-XL) 25 MG 24 hr tablet Take 1 tablet by mouth once daily   Multiple Vitamin (MULTIVITAMIN WITH MINERALS) TABS tablet Take 1 tablet by mouth every evening.   NOVOLOG FLEXPEN 100 UNIT/ML FlexPen Inject 8-16 Units into the skin 2 (two) times daily before a meal.  omeprazole (PRILOSEC) 20 MG capsule Take 20 mg by mouth daily before breakfast.   rosuvastatin (CRESTOR) 10 MG tablet Take 10 mg by mouth at bedtime.     Allergies:   Codeine, Keflex [cephalexin], and Penicillins   Social History   Socioeconomic History   Marital status: Divorced    Spouse name: Not on file   Number of children: 0   Years of education: Not on file   Highest education level: Not on file  Occupational History   Occupation: deliver truck parts  Tobacco Use   Smoking status: Former    Current packs/day: 0.00    Average packs/day: 1.5 packs/day for 30.0 years (45.0 ttl pk-yrs)    Types: Cigarettes, Cigars    Start date: 22    Quit date: 2009    Years since quitting: 16.2   Smokeless tobacco: Never  Vaping Use   Vaping status: Never Used  Substance and Sexual Activity   Alcohol use: Yes    Alcohol/week: 3.0 standard drinks of alcohol    Types: 3 Cans of beer per week    Comment: daily   Drug use: Never   Sexual activity: Not on file  Other Topics Concern   Not on file  Social History Narrative   Not on file   Social Drivers of Health   Financial Resource Strain: Not on file  Food Insecurity: No Food  Insecurity (02/06/2023)   Hunger Vital Sign    Worried About Running Out of Food in the Last Year: Never true    Ran Out of Food in the Last Year: Never true  Transportation Needs: No Transportation Needs (02/06/2023)   PRAPARE - Administrator, Civil Service (Medical): No    Lack of Transportation (Non-Medical): No  Physical Activity: Not on file  Stress: Not on file  Social Connections: Not on file     Family History: The patient's family history includes Heart failure in his father.  ROS:   Please see the history of present illness.     All other systems reviewed and are negative.  EKGs/Labs/Other Studies Reviewed:    The following studies were reviewed today:   EKG:           Recent Labs: 08/23/2023: ALT 22; TSH 1.214 10/16/2023: Platelets 162 02/24/2024: BUN 13; Creatinine, Ser 1.00; Hemoglobin 14.3; Potassium 3.7; Sodium 138  Recent Lipid Panel    Component Value Date/Time   CHOL 151 10/16/2023 0910   TRIG 256 (H) 10/16/2023 0910   HDL 48 10/16/2023 0910   CHOLHDL 3.1 10/16/2023 0910   LDLCALC 62 10/16/2023 0910     Risk Assessment/Calculations:      STOP-Bang Score:  6       Physical Exam:     Physical Exam: Blood pressure 134/66, pulse 89, height 6\' 1"  (1.854 m), weight 200 lb (90.7 kg), SpO2 95%.       GEN:  Well nourished, well developed in no acute distress HEENT: Normal NECK: No JVD; No carotid bruits LYMPHATICS: No lymphadenopathy CARDIAC: RRR , no murmurs, rubs, gallops RESPIRATORY:  Clear to auscultation without rales, wheezing or rhonchi  ABDOMEN: Soft, non-tender, non-distended MUSCULOSKELETAL:  No edema; No deformity  SKIN: Warm and dry NEUROLOGIC:  Alert and oriented x 3   ASSESSMENT:    1. Atherosclerosis of native artery of both lower extremities with intermittent claudication (HCC)   2. Hypercholesteremia   3. Coronary artery calcification   4. Medication management   5. Sinus tachycardia  PLAN:        Claudication:    follows with Dr. Myra Gianotti.   He remains on plavix for his hx of PV procedures and just had a new stent placed last month.   Dr. Myra Gianotti will need to give his opinion on whether or not the plavix can be held safely at this time .   2.  Hyperlipidemia:     LDL was 62 , on repatha   continue current meds.   3.  Sinus tachycardia :      Stable            Medication Adjustments/Labs and Tests Ordered: Current medicines are reviewed at length with the patient today.  Concerns regarding medicines are outlined above.  No orders of the defined types were placed in this encounter.  No orders of the defined types were placed in this encounter.   Patient Instructions  Lab Work: Lipids, ALT, BMET today If you have labs (blood work) drawn today and your tests are completely normal, you will receive your results only by: MyChart Message (if you have MyChart) OR A paper copy in the mail If you have any lab test that is abnormal or we need to change your treatment, we will call you to review the results.  Follow-Up: At [facility], you and your health needs are our priority.  As part of our continuing mission to provide you with exceptional heart care, our providers are all part of one team.  This team includes your primary Cardiologist (physician) and Advanced Practice Providers or APPs (Physician Assistants and Nurse Practitioners) who all work together to provide you with the care you need, when you need it.  Your next appointment:   1 year(s)  Provider:   Kristeen Miss, MD     1st Floor: - Lobby - Registration  - Pharmacy  - Lab - Cafe  2nd Floor: - PV Lab - Diagnostic Testing (echo, CT, nuclear med)  3rd Floor: - Vacant  4th Floor: - TCTS (cardiothoracic surgery) - AFib Clinic - Structural Heart Clinic - Vascular Surgery  - Vascular Ultrasound  5th Floor: - HeartCare Cardiology (general and EP) - Clinical Pharmacy for coumadin,  hypertension, lipid, weight-loss medications, and med management appointments    Valet parking services will be available as well.     Signed, Kristeen Miss, MD  04/02/2024 12:20 PM    [facility]

## 2024-04-02 ENCOUNTER — Ambulatory Visit: Attending: Cardiology | Admitting: Cardiovascular Disease

## 2024-04-02 ENCOUNTER — Encounter: Payer: Self-pay | Admitting: Cardiovascular Disease

## 2024-04-02 VITALS — BP 134/66 | HR 89 | Ht 73.0 in | Wt 200.0 lb

## 2024-04-02 DIAGNOSIS — R000 Tachycardia, unspecified: Secondary | ICD-10-CM

## 2024-04-02 DIAGNOSIS — I2510 Atherosclerotic heart disease of native coronary artery without angina pectoris: Secondary | ICD-10-CM

## 2024-04-02 DIAGNOSIS — I70213 Atherosclerosis of native arteries of extremities with intermittent claudication, bilateral legs: Secondary | ICD-10-CM

## 2024-04-02 DIAGNOSIS — Z79899 Other long term (current) drug therapy: Secondary | ICD-10-CM

## 2024-04-02 DIAGNOSIS — E7800 Pure hypercholesterolemia, unspecified: Secondary | ICD-10-CM

## 2024-04-02 NOTE — Patient Instructions (Signed)
 Lab Work: Lipids, ALT, BMET today If you have labs (blood work) drawn today and your tests are completely normal, you will receive your results only by: MyChart Message (if you have MyChart) OR A paper copy in the mail If you have any lab test that is abnormal or we need to change your treatment, we will call you to review the results.  Follow-Up: At [facility], you and your health needs are our priority.  As part of our continuing mission to provide you with exceptional heart care, our providers are all part of one team.  This team includes your primary Cardiologist (physician) and Advanced Practice Providers or APPs (Physician Assistants and Nurse Practitioners) who all work together to provide you with the care you need, when you need it.  Your next appointment:   1 year(s)  Provider:   Kristeen Miss, MD      1st Floor: - Lobby - Registration  - Pharmacy  - Lab - Cafe  2nd Floor: - PV Lab - Diagnostic Testing (echo, CT, nuclear med)  3rd Floor: - Vacant  4th Floor: - TCTS (cardiothoracic surgery) - AFib Clinic - Structural Heart Clinic - Vascular Surgery  - Vascular Ultrasound  5th Floor: - HeartCare Cardiology (general and EP) - Clinical Pharmacy for coumadin, hypertension, lipid, weight-loss medications, and med management appointments    Valet parking services will be available as well.

## 2024-04-05 ENCOUNTER — Ambulatory Visit (INDEPENDENT_AMBULATORY_CARE_PROVIDER_SITE_OTHER)
Admit: 2024-04-05 | Discharge: 2024-04-05 | Disposition: A | Discharge status: Home / Self Care | Attending: Surgery | Admitting: Surgery

## 2024-04-05 ENCOUNTER — Ambulatory Visit (HOSPITAL_COMMUNITY)
Admission: RE | Admit: 2024-04-05 | Discharge: 2024-04-05 | Disposition: A | Discharge status: Home / Self Care | Source: Ambulatory Visit | Attending: Surgery | Admitting: Surgery

## 2024-04-05 ENCOUNTER — Ambulatory Visit (INDEPENDENT_AMBULATORY_CARE_PROVIDER_SITE_OTHER): Admitting: Physician Assistant

## 2024-04-05 VITALS — BP 125/87 | HR 80 | Temp 97.9°F | Ht 72.0 in | Wt 200.0 lb

## 2024-04-05 DIAGNOSIS — I70213 Atherosclerosis of native arteries of extremities with intermittent claudication, bilateral legs: Secondary | ICD-10-CM

## 2024-04-05 DIAGNOSIS — R0989 Other specified symptoms and signs involving the circulatory and respiratory systems: Secondary | ICD-10-CM | POA: Diagnosis not present

## 2024-04-05 LAB — VAS US ABI WITH/WO TBI
Left ABI: 1.13
Right ABI: 1.15

## 2024-04-05 NOTE — Progress Notes (Unsigned)
 Office Note     CC:  follow up Requesting Provider:  Ignatius Specking, MD  HPI: Shawn Daniels is a 68 y.o. (1956-02-21) male who presents for follow up after aortogram, arteriogram of BLE with shockwave intra arterial lithotripsy of left SFA, DCB angioplasty of left SFA on 02/24/24 by Dr. Myra Gianotti. At his last follow up in February he had non invasive studies that indicated progressively worsening velocities I the SFA so Dr. Myra Gianotti had recommended an intervention. He otherwise was no experiencing any symptoms at that visit.  He has history of bilateral lower extremity interventions. He most recently underwent  Angiogram of LLE with findings of occluded left popliteal artery. This was treated with intra arterial lithotripsy and DES placement. Single vessel peroneal runoff. Prior to this he underwent Angiography with orbital atherectomy and DCB angioplasty and stenting of the right popliteal artery. This was following a right iliofemoral endarterectomy with bovine pericardial patch angioplasty on 04/2022 by Dr. Myra Gianotti. This was for severe lifestyle limiting claudication.   Today he denies any pain in his legs. He is having a lot of back pain that is making sitting especially very uncomfortable. He is understandably frustrated with his long visit today as a result of having to sit for a log time. He has requested that I send a clearance ASAP for his Plavix to Dr. Wynetta Emery so that he can have an injection in his back. He has been compliant with his Aspirin, Statin and Plavix.   Past Medical History:  Diagnosis Date   Diabetes mellitus without complication (HCC)    GERD (gastroesophageal reflux disease)    Hypertension    Peripheral vascular disease (HCC)     Past Surgical History:  Procedure Laterality Date   ABDOMINAL AORTOGRAM W/LOWER EXTREMITY Right 08/20/2022   Procedure: ABDOMINAL AORTOGRAM W/LOWER EXTREMITY;  Surgeon: Nada Libman, MD;  Location: [facility];  Service:  Cardiovascular;  Laterality: Right;   ABDOMINAL AORTOGRAM W/LOWER EXTREMITY N/A 11/19/2022   Procedure: ABDOMINAL AORTOGRAM W/LOWER EXTREMITY;  Surgeon: Nada Libman, MD;  Location: [facility];  Service: Cardiovascular;  Laterality: N/A;   ABDOMINAL AORTOGRAM W/LOWER EXTREMITY Bilateral 02/24/2024   Procedure: ABDOMINAL AORTOGRAM W/LOWER EXTREMITY;  Surgeon: Nada Libman, MD;  Location: [facility];  Service: Cardiovascular;  Laterality: Bilateral;   ENDARTERECTOMY FEMORAL Right 04/26/2022   Procedure: RIGHT FEMORAL ENDARTERECTOMY;  Surgeon: Nada Libman, MD;  Location: [facility];  Service: Vascular;  Laterality: Right;   IR KYPHO LUMBAR INC FX REDUCE BONE BX UNI/BIL CANNULATION INC/IMAGING  01/13/2023   IR RADIOLOGIST EVAL & MGMT  01/29/2023   KNEE SURGERY     LOWER EXTREMITY ANGIOGRAPHY N/A 01/01/2022   Procedure: LOWER EXTREMITY ANGIOGRAPHY;  Surgeon: Elder Negus, MD;  Location: [facility];  Service: Cardiovascular;  Laterality: N/A;   PATCH ANGIOPLASTY Right 04/26/2022   Procedure: PATCH ANGIOPLASTY OF RIGHT FEMORAL ARTERY USING XENOSURE BOVINE PERCARDIUM PATCH;  Surgeon: Nada Libman, MD;  Location: [facility];  Service: Vascular;  Laterality: Right;   PENILE PROSTHESIS IMPLANT     PERIPHERAL INTRAVASCULAR LITHOTRIPSY Left 11/19/2022   Procedure: INTRAVASCULAR LITHOTRIPSY;  Surgeon: Nada Libman, MD;  Location: [facility];  Service: Cardiovascular;  Laterality: Left;   PERIPHERAL INTRAVASCULAR LITHOTRIPSY Left 02/24/2024   Procedure: PERIPHERAL INTRAVASCULAR LITHOTRIPSY;  Surgeon: Nada Libman, MD;  Location: [facility];  Service: Cardiovascular;  Laterality: Left;  LT SFA   PERIPHERAL VASCULAR ATHERECTOMY  08/20/2022   Procedure: PERIPHERAL VASCULAR ATHERECTOMY;  Surgeon: Nada Libman, MD;  Location: [facility];  Service: Cardiovascular;;  popliteal   PERIPHERAL VASCULAR BALLOON ANGIOPLASTY  01/08/2022   Procedure: PERIPHERAL  VASCULAR BALLOON ANGIOPLASTY;  Surgeon: Yates Decamp, MD;  Location: [facility];  Service: Cardiovascular;;   PERIPHERAL VASCULAR BALLOON ANGIOPLASTY  02/24/2024   Procedure: PERIPHERAL VASCULAR BALLOON ANGIOPLASTY;  Surgeon: Nada Libman, MD;  [facility]: [facility];  Service: Cardiovascular;;  Rt SFA   PERIPHERAL VASCULAR INTERVENTION Left 11/19/2022   Procedure: PERIPHERAL VASCULAR INTERVENTION;  Surgeon: Nada Libman, MD;  Location: [facility];  Service: Cardiovascular;  Laterality: Left;   WRIST SURGERY      Social History   Socioeconomic History   Marital status: Divorced    Spouse name: Not on file   Number of children: 0   Years of education: Not on file   Highest education level: Not on file  Occupational History   Occupation: deliver truck parts  Tobacco Use   Smoking status: Former    Current packs/day: 0.00    Average packs/day: 1.5 packs/day for 30.0 years (45.0 ttl pk-yrs)    Types: Cigarettes, Cigars    Start date: 64    Quit date: 2009    Years since quitting: 16.2   Smokeless tobacco: Never  Vaping Use   Vaping status: Never Used  Substance and Sexual Activity   Alcohol use: Yes    Alcohol/week: 3.0 standard drinks of alcohol    Types: 3 Cans of beer per week    Comment: daily   Drug use: Never   Sexual activity: Not on file  Other Topics Concern   Not on file  Social History Narrative   Not on file   Social Drivers of Health   Financial Resource Strain: Not on file  Food Insecurity: No Food Insecurity (02/06/2023)   Hunger Vital Sign    Worried About Running Out of Food in the Last Year: Never true    Ran Out of Food in the Last Year: Never true  Transportation Needs: No Transportation Needs (02/06/2023)   PRAPARE - Administrator, Civil Service (Medical): No    Lack of Transportation (Non-Medical): No  Physical Activity: Not on file  Stress: Not on file  Social Connections: Not on file  Intimate Partner  Violence: Not At Risk (02/06/2023)   Humiliation, Afraid, Rape, and Kick questionnaire    Fear of Current or Ex-Partner: No    Emotionally Abused: No    Physically Abused: No    Sexually Abused: No    Family History  Problem Relation Age of Onset   Heart failure Father     Current Outpatient Medications  Medication Sig Dispense Refill   Abaloparatide (TYMLOS) 3120 MCG/1.56ML SOPN Inject 80 mcg into the skin daily. 1.56 mL 12   aspirin EC 81 MG tablet Take 1 tablet (81 mg total) by mouth at bedtime. 30 tablet 12   clopidogrel (PLAVIX) 75 MG tablet Take 1 tablet by mouth once daily 90 tablet 0   Evolocumab (REPATHA SURECLICK) 140 MG/ML SOAJ INJECT 1 PEN (140 MG) SUBCUTANEOUSLY EVERY 14 DAYS 6 mL 3   fenofibrate (TRICOR) 145 MG tablet Take 145 mg by mouth in the morning.     Insulin Pen Needle (ASSURE ID DUO PRO PEN NEEDLES) 31G X 5 MM MISC 1 each by Does not apply route daily. 30 each 24   JARDIANCE 25  MG TABS tablet Take 25 mg by mouth daily.     LANTUS SOLOSTAR 100 UNIT/ML Solostar Pen Inject 36 Units into the skin at bedtime.     lisinopril (ZESTRIL) 5 MG tablet Take 5 mg by mouth in the morning and at bedtime.     Magnesium 400 MG CAPS Take 400 mg by mouth every evening.     metoprolol succinate (TOPROL-XL) 25 MG 24 hr tablet Take 1 tablet by mouth once daily 90 tablet 2   Multiple Vitamin (MULTIVITAMIN WITH MINERALS) TABS tablet Take 1 tablet by mouth every evening.     NOVOLOG FLEXPEN 100 UNIT/ML FlexPen Inject 8-16 Units into the skin 2 (two) times daily before a meal.     omeprazole (PRILOSEC) 20 MG capsule Take 20 mg by mouth daily before breakfast.     rosuvastatin (CRESTOR) 10 MG tablet Take 10 mg by mouth at bedtime.     icosapent Ethyl (VASCEPA) 1 g capsule Take 2 capsules (2 g total) by mouth 2 (two) times daily. (Patient not taking: Reported on 04/05/2024) 120 capsule 11   No current facility-administered medications for this visit.   Facility-Administered Medications  Ordered in Other Visits  Medication Dose Route Frequency Provider Last Rate Last Admin   iodixanol (VISIPAQUE) 320 MG/ML injection    PRN Nada Libman, MD   120 mL at 08/20/22 1145    Allergies  Allergen Reactions   Codeine Itching and Other (See Comments)    Can take/tolerate in lower doses   Keflex [Cephalexin] Other (See Comments)    "Shriveled my skin"   Penicillins Other (See Comments)    Reaction from childhood not recalled     REVIEW OF SYSTEMS:   [X]  denotes positive finding, [ ]  denotes negative finding Cardiac  Comments:  Chest pain or chest pressure:    Shortness of breath upon exertion:    Short of breath when lying flat:    Irregular heart rhythm:        Vascular    Pain in calf, thigh, or hip brought on by ambulation:    Pain in feet at night that wakes you up from your sleep:     Blood clot in your veins:    Leg swelling:         Pulmonary    Oxygen at home:    Productive cough:     Wheezing:         Neurologic    Sudden weakness in arms or legs:     Sudden numbness in arms or legs:     Sudden onset of difficulty speaking or slurred speech:    Temporary loss of vision in one eye:     Problems with dizziness:         Gastrointestinal    Blood in stool:     Vomited blood:         Genitourinary    Burning when urinating:     Blood in urine:        Psychiatric    Major depression:         Hematologic    Bleeding problems:    Problems with blood clotting too easily:        Skin    Rashes or ulcers:        Constitutional    Fever or chills:      PHYSICAL EXAMINATION:  Vitals:   04/05/24 1501  BP: 125/87  Pulse: 80  Temp: 97.9 F (36.6 C)  TempSrc: Temporal  Weight: 200 lb (90.7 kg)  Height: 6' (1.829 m)    General:  WDWN in NAD; vital signs documented above Gait: Normal HENT: WNL, normocephalic Pulmonary: normal non-labored breathing without wheezing Cardiac: regular HR, right carotid bruit Abdomen: soft Vascular  Exam/Pulses: 2+ femoral pulses,2+ right Dp, 1+ left DP, feet warm and well perfused Extremities: without ischemic changes, without Gangrene , without cellulitis; without open wounds;  Musculoskeletal: no muscle wasting or atrophy  Neurologic: A&O X 3 Psychiatric:  The pt has Normal affect.   Non-Invasive Vascular Imaging:   +-------+-----------+-----------+------------+------------+  ABI/TBIToday's ABIToday's TBIPrevious ABIPrevious TBI  +-------+-----------+-----------+------------+------------+  Right 1.15       0.40       1.26        0.57          +-------+-----------+-----------+------------+------------+  Left  1.13       0.60       1.01        0.55          +-------+-----------+-----------+------------+------------+   VAS US  lower extremity arterial duplex: Summary:  Left: 50-74% stenosis noted in the superficial femoral artery. Patent stent with no evidence of stenosis in the popliteal artery artery.    ASSESSMENT/PLAN:: 68 y.o. male here for follow up after aortogram, arteriogram of BLE with shockwave intra arterial lithotripsy of left SFA, DCB angioplasty of left SFA on 02/24/24 by Dr. Charlotte Cookey. This was performed after concerning findings on non invasive studies at his last follow up. His LLE is well perfused post intervention. He is without claudication, rest pain or tissue loss. His primary concern today is severe back pain. He is scheduled to follow up soon with Dr. Lamon Pillow regarding management of this. - Duplex today shows patent left SFA stent. Triphasic flow throughout. There are some elevated velocities in the proximal and mid SFA however no indication for intervention at this time - ABI is slightly decreased on the right, slightly increased on the left. Overall stable - Will send clearance to Dr. Tenna Fees office for injection. Okay to hold Plavix for 3-5 days before procedure. Please have patient resume Plavix after procedure when it is felt safe to do so - he was  suppose to be scheduled for a carotid duplex due to a bruit being found on examination. This was not scheduled for today so patient will return in the near future to have this completed - Continue Aspirin, Plavix, Statin - He will otherwise follow up in 3 months with BLE ABI and arterial duplex   Deneen Finical, PA-C [facility] [phone]  On call MD: Fulton Job

## 2024-04-06 ENCOUNTER — Telehealth: Payer: Self-pay | Admitting: *Deleted

## 2024-04-06 ENCOUNTER — Ambulatory Visit (HOSPITAL_BASED_OUTPATIENT_CLINIC_OR_DEPARTMENT_OTHER): Admitting: Family

## 2024-04-06 ENCOUNTER — Telehealth: Payer: Self-pay

## 2024-04-06 ENCOUNTER — Encounter: Payer: Self-pay | Admitting: Physician Assistant

## 2024-04-06 ENCOUNTER — Other Ambulatory Visit: Payer: Self-pay | Admitting: *Deleted

## 2024-04-06 DIAGNOSIS — R0989 Other specified symptoms and signs involving the circulatory and respiratory systems: Secondary | ICD-10-CM

## 2024-04-06 DIAGNOSIS — I70213 Atherosclerosis of native arteries of extremities with intermittent claudication, bilateral legs: Secondary | ICD-10-CM

## 2024-04-06 NOTE — Telephone Encounter (Signed)
   Pre-operative Risk Assessment    Patient Name: Shawn Daniels  DOB: 11/10/1956 MRN: [id number]   Date of last office visit: 04/02/24 Dr. Alroy Aspen  Date of next office visit: N/A   Request for Surgical Clearance    Procedure:   Left L3 TFESI  Date of Surgery:  Clearance TBD                                 Surgeon:  Dr. Katheryn Pandy  Surgeon's Group or Practice Name:  [facility]  Phone number:  [phone] Fax number:  [phone]   Type of Clearance Requested:   - Medical  - Pharmacy:  Hold Clopidogrel (Plavix) 7 days prior    Type of Anesthesia:  Not Indicated   Additional requests/questions:    Mansfield Seip   04/06/2024, 11:36 AM

## 2024-04-06 NOTE — Telephone Encounter (Signed)
   Patient Name: Shawn Daniels  DOB: 11-Dec-1956 MRN: [id number]  Primary Cardiologist: Ahmad Alert, MD  Chart reviewed as part of pre-operative protocol coverage. Given past medical history and time since last visit, based on ACC/AHA guidelines, WANG GRANADA is at acceptable risk for the planned procedure without further cardiovascular testing.   Cardiac clearance addressed by Dr. Alroy Aspen during office visit on 04/02/2024.   Plavix is not managed by cardiology service.  Please reach out to vascular surgery for recommendations on holding Plavix.  I will route this recommendation to the requesting party via Epic fax function and remove from pre-op pool.  Please call with questions.  Ava Boatman, NP 04/06/2024, 11:57 AM

## 2024-04-06 NOTE — Telephone Encounter (Signed)
 Pt approved for tymlos by [employer]. Approval letter scanned.

## 2024-04-06 NOTE — Telephone Encounter (Signed)
 Pt called to ensure Dr. Lamon Pillow is aware he can hold Plavix for 3-5 days for his procedure. I have left Lauren, MA for Lamon Pillow a voicemail confirming this and letting her know it is in APP office note from pt's visit here yesterday. Pt is aware of the above.

## 2024-04-14 ENCOUNTER — Encounter: Payer: Self-pay | Admitting: Sports Medicine

## 2024-04-14 ENCOUNTER — Ambulatory Visit: Admitting: Sports Medicine

## 2024-04-14 VITALS — BP 130/80 | Ht 72.0 in | Wt 200.0 lb

## 2024-04-14 DIAGNOSIS — M25512 Pain in left shoulder: Secondary | ICD-10-CM

## 2024-04-14 MED ORDER — METHYLPREDNISOLONE ACETATE 40 MG/ML IJ SUSP
40.0000 mg | Freq: Once | INTRAMUSCULAR | Status: AC
  Administered 2024-04-14: 40 mg via INTRA_ARTICULAR

## 2024-04-14 NOTE — Progress Notes (Signed)
   Subjective:    Patient ID: Shawn Daniels, male    DOB: 17-Oct-1956, 68 y.o.   MRN: [id number]  HPI chief complaint: Left shoulder pain  Shawn Daniels is a very pleasant left-hand-dominant 68 year old male that presents today with 3 weeks of left shoulder pain.  He was doing some overhead presses at the gym when he began to experience significant pain in the left shoulder the following day.  Pain is to the point now that it bothers him with most activities of daily living.  He is having difficulty sleeping at night.  No problems with the shoulder in the past.  He has noticed weakness.  Past medical history reviewed Medications reviewed Allergies reviewed   Review of Systems As above    Objective:   Physical Exam  Well-developed, well-nourished.  No acute distress  Left shoulder: No obvious atrophy.  No tenderness to palpation.  Limited forward flexion and abduction to about 100 degrees.  Pain with internal and external rotation.  4+/5 strength with resisted supraspinatus and external rotation on the left.  Neurovascularly intact distally.      Assessment & Plan:   Left shoulder pain likely secondary to rotator cuff tear  We discussed imaging versus subacromial cortisone injection.  [name] would like to try a subacromial cortisone injection first.  This was accomplished atraumatically under sterile technique.  He tolerates this without difficulty.  He will follow-up with me in 3 weeks.  If pain or weakness persist then consider x-ray and MRI at that time.  This note was dictated using Dragon naturally speaking software and may contain errors in syntax, spelling, or content which have not been identified prior to signing this note.   Consent obtained and verified. Time-out conducted. Noted no overlying erythema, induration, or other signs of local infection. Skin prepped in a sterile fashion. Topical analgesic spray: Ethyl chloride. Joint: Left shoulder, subacromial Needle: 25-gauge 1.5  inch Completed without difficulty. Meds: 3 cc 1% Xylocaine , 1 cc (40 mg) Depo-Medrol

## 2024-04-20 ENCOUNTER — Encounter: Payer: Self-pay | Admitting: Sports Medicine

## 2024-04-20 ENCOUNTER — Ambulatory Visit: Admitting: Sports Medicine

## 2024-04-20 VITALS — BP 130/72 | Ht 72.0 in | Wt 200.0 lb

## 2024-04-20 DIAGNOSIS — M75102 Unspecified rotator cuff tear or rupture of left shoulder, not specified as traumatic: Secondary | ICD-10-CM

## 2024-04-20 DIAGNOSIS — M25512 Pain in left shoulder: Secondary | ICD-10-CM | POA: Diagnosis not present

## 2024-04-20 MED ORDER — OXYCODONE-ACETAMINOPHEN 5-325 MG PO TABS: 1.0000 | ORAL_TABLET | Freq: Three times a day (TID) | ORAL | 0 refills | Status: DC | PRN

## 2024-04-20 NOTE — Progress Notes (Signed)
   Subjective:    Patient ID: Shawn Daniels, male    DOB: 03-12-1956, 68 y.o.   MRN: [id number]  HPI Shawn Daniels presents today with persistent left shoulder pain.  He was seen last week in the office and given a subacromial cortisone injection.  This is not helped him at all.  His pain is worse at night.  He is having difficulty sleeping.   Review of Systems As above    Objective:   Physical Exam  Left shoulder: Limited range of motion.  No swelling.  4/5 strength with resisted supraspinatus.  4+/5 strength with resisted external rotation.  Neurovascularly intact distally.      Assessment & Plan:   Left shoulder rotator cuff tear  Referral to [facility] where they can hopefully expedite imaging.  In the meantime, I will prescribe him a limited number of Percocet to take at night for pain.  He has taken that in the past and tolerated well.  I will defer further workup and treatment to the orthopedist at [facility].  This note was dictated using Dragon naturally speaking software and may contain errors in syntax, spelling, or content which have not been identified prior to signing this note.

## 2024-04-21 DIAGNOSIS — M5440 Lumbago with sciatica, unspecified side: Secondary | ICD-10-CM | POA: Diagnosis not present

## 2024-04-22 DIAGNOSIS — M5416 Radiculopathy, lumbar region: Secondary | ICD-10-CM | POA: Diagnosis not present

## 2024-04-23 DIAGNOSIS — M542 Cervicalgia: Secondary | ICD-10-CM | POA: Diagnosis not present

## 2024-04-23 DIAGNOSIS — M25512 Pain in left shoulder: Secondary | ICD-10-CM | POA: Diagnosis not present

## 2024-04-29 DIAGNOSIS — E1165 Type 2 diabetes mellitus with hyperglycemia: Secondary | ICD-10-CM | POA: Diagnosis not present

## 2024-04-30 DIAGNOSIS — M25512 Pain in left shoulder: Secondary | ICD-10-CM | POA: Diagnosis not present

## 2024-05-05 ENCOUNTER — Ambulatory Visit: Admitting: Sports Medicine

## 2024-05-07 DIAGNOSIS — M19012 Primary osteoarthritis, left shoulder: Secondary | ICD-10-CM | POA: Diagnosis not present

## 2024-05-11 DIAGNOSIS — M5440 Lumbago with sciatica, unspecified side: Secondary | ICD-10-CM | POA: Diagnosis not present

## 2024-05-11 DIAGNOSIS — S32010D Wedge compression fracture of first lumbar vertebra, subsequent encounter for fracture with routine healing: Secondary | ICD-10-CM | POA: Diagnosis not present

## 2024-05-30 DIAGNOSIS — E1165 Type 2 diabetes mellitus with hyperglycemia: Secondary | ICD-10-CM | POA: Diagnosis not present

## 2024-06-03 ENCOUNTER — Other Ambulatory Visit: Payer: Self-pay | Admitting: Cardiovascular Disease

## 2024-06-04 DIAGNOSIS — M19012 Primary osteoarthritis, left shoulder: Secondary | ICD-10-CM | POA: Diagnosis not present

## 2024-06-15 DIAGNOSIS — Z1331 Encounter for screening for depression: Secondary | ICD-10-CM | POA: Diagnosis not present

## 2024-06-15 DIAGNOSIS — E7800 Pure hypercholesterolemia, unspecified: Secondary | ICD-10-CM | POA: Diagnosis not present

## 2024-06-15 DIAGNOSIS — Z7189 Other specified counseling: Secondary | ICD-10-CM | POA: Diagnosis not present

## 2024-06-15 DIAGNOSIS — Z0000 Encounter for general adult medical examination without abnormal findings: Secondary | ICD-10-CM | POA: Diagnosis not present

## 2024-06-15 DIAGNOSIS — Z79899 Other long term (current) drug therapy: Secondary | ICD-10-CM | POA: Diagnosis not present

## 2024-06-15 DIAGNOSIS — R5383 Other fatigue: Secondary | ICD-10-CM | POA: Diagnosis not present

## 2024-06-15 DIAGNOSIS — Z299 Encounter for prophylactic measures, unspecified: Secondary | ICD-10-CM | POA: Diagnosis not present

## 2024-06-15 DIAGNOSIS — I10 Essential (primary) hypertension: Secondary | ICD-10-CM | POA: Diagnosis not present

## 2024-06-15 DIAGNOSIS — Z125 Encounter for screening for malignant neoplasm of prostate: Secondary | ICD-10-CM | POA: Diagnosis not present

## 2024-06-15 DIAGNOSIS — Z1339 Encounter for screening examination for other mental health and behavioral disorders: Secondary | ICD-10-CM | POA: Diagnosis not present

## 2024-06-18 ENCOUNTER — Encounter (HOSPITAL_COMMUNITY): Payer: Self-pay | Admitting: Interventional Radiology

## 2024-06-21 ENCOUNTER — Other Ambulatory Visit: Payer: Self-pay | Admitting: Cardiovascular Disease

## 2024-07-14 NOTE — Progress Notes (Addendum)
 Office Note     CC:  follow up Requesting Provider:  Rosamond Leta NOVAK, MD  HPI: Shawn Daniels is a 68 y.o. (Jan 05, 1956) male who presents for surveillance of PAD as well as carotid artery stenosis.  He was noted to have a bruit during examination of the carotid arteries.  He denies any diagnosis of CVA or TIA since last office visit.  Also denies any current strokelike symptoms including slurring speech, changes in vision, or one-sided weakness.  He did drop attacks or left arm claudication.  He has a torn left rotator cuff and thinks he may need surgery.  He is also followed for PAD with numerous interventions in the past including right iliofemoral endarterectomy with bovine patch angioplasty in May 2023 by Dr. Serene.  He has also had orbital atherectomy and DCB angioplasty and stenting of the right popliteal artery.  He also underwent lithotripsy and drug-eluting stent placement of the left popliteal artery due to an occluded popliteal.  Most recently he underwent shockwave lithotripsy with left SFA drug-coated balloon angioplasty on 02/24/2024 by Dr. Serene.  He denies any claudication rest pain or tissue loss of bilateral lower extremities.  He is on aspirin , Plavix , statin daily.  He is a former smoker.   Past Medical History:  Diagnosis Date   Diabetes mellitus without complication (HCC)    GERD (gastroesophageal reflux disease)    Hypertension    Peripheral vascular disease (HCC)     Past Surgical History:  Procedure Laterality Date   ABDOMINAL AORTOGRAM W/LOWER EXTREMITY Right 08/20/2022   Procedure: ABDOMINAL AORTOGRAM W/LOWER EXTREMITY;  Surgeon: Serene Gaile ORN, MD;  [facility];  Service: Cardiovascular;  Laterality: Right;   ABDOMINAL AORTOGRAM W/LOWER EXTREMITY N/A 11/19/2022   Procedure: ABDOMINAL AORTOGRAM W/LOWER EXTREMITY;  Surgeon: Serene Gaile ORN, MD;  [facility];  Service: Cardiovascular;  Laterality: N/A;   ABDOMINAL  AORTOGRAM W/LOWER EXTREMITY Bilateral 02/24/2024   Procedure: ABDOMINAL AORTOGRAM W/LOWER EXTREMITY;  Surgeon: Serene Gaile ORN, MD;  [facility];  Service: Cardiovascular;  Laterality: Bilateral;   ENDARTERECTOMY FEMORAL Right 04/26/2022   Procedure: RIGHT FEMORAL ENDARTERECTOMY;  Surgeon: Serene Gaile ORN, MD;  [facility];  Service: Vascular;  Laterality: Right;   IR KYPHO LUMBAR INC FX REDUCE BONE BX UNI/BIL CANNULATION INC/IMAGING  01/13/2023   IR RADIOLOGIST EVAL & MGMT  01/29/2023   KNEE SURGERY     LOWER EXTREMITY ANGIOGRAPHY N/A 01/01/2022   Procedure: LOWER EXTREMITY ANGIOGRAPHY;  Surgeon: Elmira Newman PARAS, MD;  [facility];  Service: Cardiovascular;  Laterality: N/A;   PATCH ANGIOPLASTY Right 04/26/2022   Procedure: PATCH ANGIOPLASTY OF RIGHT FEMORAL ARTERY USING XENOSURE BOVINE PERCARDIUM PATCH;  Surgeon: Serene Gaile ORN, MD;  [facility];  Service: Vascular;  Laterality: Right;   PENILE PROSTHESIS IMPLANT     PERIPHERAL INTRAVASCULAR LITHOTRIPSY Left 11/19/2022   Procedure: INTRAVASCULAR LITHOTRIPSY;  Surgeon: Serene Gaile ORN, MD;  [facility];  Service: Cardiovascular;  Laterality: Left;   PERIPHERAL INTRAVASCULAR LITHOTRIPSY Left 02/24/2024   Procedure: PERIPHERAL INTRAVASCULAR LITHOTRIPSY;  Surgeon: Serene Gaile ORN, MD;  [facility];  Service: Cardiovascular;  Laterality: Left;  LT SFA   PERIPHERAL VASCULAR ATHERECTOMY  08/20/2022   Procedure: PERIPHERAL VASCULAR ATHERECTOMY;  Surgeon: Serene Gaile ORN, MD;  [facility];  Service: Cardiovascular;;  popliteal   PERIPHERAL VASCULAR BALLOON ANGIOPLASTY  01/08/2022   Procedure: PERIPHERAL VASCULAR BALLOON ANGIOPLASTY;  Surgeon: Ladona Heinz, MD;  Location: [facility];  Service: Cardiovascular;;   PERIPHERAL VASCULAR BALLOON ANGIOPLASTY  02/24/2024   Procedure: PERIPHERAL VASCULAR BALLOON ANGIOPLASTY;  Surgeon: Serene Gaile ORN, MD;  [facility];  Service: Cardiovascular;;  Rt SFA   PERIPHERAL VASCULAR INTERVENTION Left 11/19/2022   Procedure: PERIPHERAL VASCULAR INTERVENTION;  Surgeon: Serene Gaile ORN, MD;  [facility];  Service: Cardiovascular;  Laterality: Left;   WRIST SURGERY      Social History   Socioeconomic History   Marital status: Divorced    Spouse name: Not on file   Number of children: 0   Years of education: Not on file   Highest education level: Not on file  Occupational History   Occupation: deliver truck parts  Tobacco Use   Smoking status: Former    Current packs/day: 0.00    Average packs/day: 1.5 packs/day for 30.0 years (45.0 ttl pk-yrs)    Types: Cigarettes, Cigars    Start date: 44    Quit date: 2009    Years since quitting: 16.5   Smokeless tobacco: Never  Vaping Use   Vaping status: Never Used  Substance and Sexual Activity   Alcohol  use: Yes    Alcohol /week: 3.0 standard drinks of alcohol     Types: 3 Cans of beer per week    Comment: daily   Drug use: Never   Sexual activity: Not on file  Other Topics Concern   Not on file  Social History Narrative   Not on file   Social Drivers of Health   Financial Resource Strain: Not on file  Food Insecurity: No Food Insecurity (02/06/2023)   Hunger Vital Sign    Worried About Running Out of Food in the Last Year: Never true    Ran Out of Food in the Last Year: Never true  Transportation Needs: No Transportation Needs (02/06/2023)   PRAPARE - Administrator, Civil Service (Medical): No    Lack of Transportation (Non-Medical): No  Physical Activity: Not on file  Stress: Not on file  Social Connections: Not on file  Intimate Partner Violence: Not At Risk (02/06/2023)   Humiliation, Afraid, Rape, and Kick questionnaire    Fear of Current or Ex-Partner: No    Emotionally Abused: No    Physically Abused: No    Sexually Abused: No    Family History  Problem Relation Age of Onset   Heart failure  Father     Current Outpatient Medications  Medication Sig Dispense Refill   Abaloparatide  (TYMLOS ) 3120 MCG/1.56ML SOPN Inject 80 mcg into the skin daily. 1.56 mL 12   aspirin  EC 81 MG tablet Take 1 tablet (81 mg total) by mouth at bedtime. 30 tablet 12   clopidogrel  (PLAVIX ) 75 MG tablet Take 1 tablet by mouth once daily 90 tablet 2   Evolocumab  (REPATHA  SURECLICK) 140 MG/ML SOAJ INJECT 1 PEN (140 MG) SUBCUTANEOUSLY EVERY 14 DAYS 6 mL 3   fenofibrate  (TRICOR ) 145 MG tablet Take 1 tablet by mouth once daily 90 tablet 3   icosapent  Ethyl (VASCEPA ) 1 g capsule Take 2 capsules (2 g total) by mouth 2 (two) times daily. (Patient not taking: Reported on 04/05/2024) 120 capsule 11   Insulin  Pen Needle (ASSURE ID DUO PRO PEN NEEDLES) 31G X 5 MM MISC 1 each by Does not apply route daily. 30 each 24   JARDIANCE 25 MG TABS tablet Take 25 mg by mouth daily.  LANTUS  SOLOSTAR 100 UNIT/ML Solostar Pen Inject 36 Units into the skin at bedtime.     lisinopril  (ZESTRIL ) 5 MG tablet Take 5 mg by mouth in the morning and at bedtime.     Magnesium  400 MG CAPS Take 400 mg by mouth every evening.     metoprolol  succinate (TOPROL -XL) 25 MG 24 hr tablet Take 1 tablet by mouth once daily 90 tablet 2   Multiple Vitamin (MULTIVITAMIN WITH MINERALS) TABS tablet Take 1 tablet by mouth every evening.     NOVOLOG  FLEXPEN 100 UNIT/ML FlexPen Inject 8-16 Units into the skin 2 (two) times daily before a meal.     omeprazole (PRILOSEC) 20 MG capsule Take 20 mg by mouth daily before breakfast.     oxyCODONE -acetaminophen  (PERCOCET/ROXICET) 5-325 MG tablet Take 1 tablet by mouth every 8 (eight) hours as needed for severe pain (pain score 7-10). 15 tablet 0   rosuvastatin  (CRESTOR ) 10 MG tablet Take 10 mg by mouth at bedtime.     No current facility-administered medications for this visit.   Facility-Administered Medications Ordered in Other Visits  Medication Dose Route Frequency Provider Last Rate Last Admin   iodixanol   (VISIPAQUE ) 320 MG/ML injection    PRN Serene Gaile ORN, MD   120 mL at 08/20/22 1145    Allergies  Allergen Reactions   Codeine Itching and Other (See Comments)    Can take/tolerate in lower doses   Keflex [Cephalexin] Other (See Comments)    Shriveled my skin   Penicillins Other (See Comments)    Reaction from childhood not recalled     REVIEW OF SYSTEMS:   [X]  denotes positive finding, [ ]  denotes negative finding Cardiac  Comments:  Chest pain or chest pressure:    Shortness of breath upon exertion:    Short of breath when lying flat:    Irregular heart rhythm:        Vascular    Pain in calf, thigh, or hip brought on by ambulation:    Pain in feet at night that wakes you up from your sleep:     Blood clot in your veins:    Leg swelling:         Pulmonary    Oxygen at home:    Productive cough:     Wheezing:         Neurologic    Sudden weakness in arms or legs:     Sudden numbness in arms or legs:     Sudden onset of difficulty speaking or slurred speech:    Temporary loss of vision in one eye:     Problems with dizziness:         Gastrointestinal    Blood in stool:     Vomited blood:         Genitourinary    Burning when urinating:     Blood in urine:        Psychiatric    Major depression:         Hematologic    Bleeding problems:    Problems with blood clotting too easily:        Skin    Rashes or ulcers:        Constitutional    Fever or chills:      PHYSICAL EXAMINATION:  Vitals:   07/19/24 1447 07/19/24 1452  BP: 105/71 (!) 174/80  Pulse: 80   Temp: 98.1 F (36.7 C)   TempSrc: Temporal   Weight: 206 lb (93.4  kg)     General:  WDWN in NAD; vital signs documented above Gait: Not observed HENT: WNL, normocephalic Pulmonary: normal non-labored breathing Cardiac: regular HR Abdomen: soft, NT, no masses Skin: without rashes Vascular Exam/Pulses: palpable DP pulses; 2+ right radial pulse; 1+ left radial pulse Extremities:  without ischemic changes, without Gangrene , without cellulitis; without open wounds;  Musculoskeletal: no muscle wasting or atrophy  Neurologic: A&O X 3; CN grossly intact Psychiatric:  The pt has Normal affect.   Non-Invasive Vascular Imaging:   1 to 39% stenosis of bilateral internal carotid arteries To and fro left vertebral with waveform indicative of left subclavian stenosis  Patent left popliteal stent Greater than 500 cm/s velocity in the left common femoral artery however no change in left ABI or TBI  ABI/TBIToday's ABIToday's TBIPrevious ABIPrevious TBI  +-------+-----------+-----------+------------+------------+  Right 1.18       0.60       1.15        0.40          +-------+-----------+-----------+------------+------------+  Left  1.06       0.55       1.13        0.60           ASSESSMENT/PLAN:: 68 y.o. male here for surveillance of PAD and carotid artery stenosis  Bruit heard on previous exam by his cardiologist.  Fortunately carotid duplex demonstrates 1 to 39% stenosis of bilateral internal carotid arteries.  He does have a large blood pressure difference between his right and left arm.  He has to and fro left vertebral with waveform indicative of left subclavian stenosis.  This is asymptomatic.  No indication for intervention.  Encouraged him to check blood pressure at home and at appointments with the right upper extremity.  He denies claudication, rest pain, or tissue loss of bilateral lower extremities.  Left lower extremity duplex demonstrates a widely patent popliteal stent.  There is a new finding of greater than 500 cm/s in the common femoral artery.  Despite this he has biphasic flow throughout his SFA and popliteal, he has a palpable DP pulse, and his ABI and TBI have not changed.  He will continue his aspirin , Plavix , and statin.  We will repeat studies and ABIs in 6 months.   Donnice Sender, PA-C [facility] [phone]  Clinic MD:   [place] on call

## 2024-07-19 ENCOUNTER — Ambulatory Visit (HOSPITAL_BASED_OUTPATIENT_CLINIC_OR_DEPARTMENT_OTHER)
Admission: RE | Admit: 2024-07-19 | Discharge: 2024-07-19 | Disposition: A | Discharge status: Home / Self Care | Source: Ambulatory Visit | Attending: Physician Assistant | Admitting: Physician Assistant

## 2024-07-19 ENCOUNTER — Ambulatory Visit (HOSPITAL_COMMUNITY)
Admission: RE | Admit: 2024-07-19 | Discharge: 2024-07-19 | Disposition: A | Discharge status: Home / Self Care | Source: Ambulatory Visit | Attending: Physician Assistant | Admitting: Physician Assistant

## 2024-07-19 ENCOUNTER — Ambulatory Visit: Admitting: Physician Assistant

## 2024-07-19 VITALS — BP 174/80 | HR 80 | Temp 98.1°F | Wt 206.0 lb

## 2024-07-19 DIAGNOSIS — I70213 Atherosclerosis of native arteries of extremities with intermittent claudication, bilateral legs: Secondary | ICD-10-CM

## 2024-07-19 DIAGNOSIS — R0989 Other specified symptoms and signs involving the circulatory and respiratory systems: Secondary | ICD-10-CM | POA: Diagnosis not present

## 2024-07-19 DIAGNOSIS — I771 Stricture of artery: Secondary | ICD-10-CM

## 2024-07-19 LAB — VAS US ABI WITH/WO TBI
Left ABI: 1.06
Right ABI: 1.18

## 2024-07-26 ENCOUNTER — Ambulatory Visit: Payer: Medicare Other

## 2024-07-26 ENCOUNTER — Encounter (HOSPITAL_COMMUNITY): Payer: Medicare Other

## 2024-07-26 ENCOUNTER — Other Ambulatory Visit (HOSPITAL_COMMUNITY): Payer: Medicare Other

## 2024-08-02 ENCOUNTER — Other Ambulatory Visit (HOSPITAL_COMMUNITY)

## 2024-08-02 ENCOUNTER — Ambulatory Visit

## 2024-08-02 ENCOUNTER — Encounter (HOSPITAL_COMMUNITY)

## 2024-08-19 ENCOUNTER — Telehealth: Payer: Self-pay

## 2024-08-19 NOTE — Telephone Encounter (Signed)
 Appointment scheduled for 08/20/2024 @ 11:40, add in slot, per CHARLENA Alberts, NP, Med req and consent.

## 2024-08-19 NOTE — Telephone Encounter (Signed)
   Pre-operative Risk Assessment    Patient Name: Shawn Daniels  DOB: 1956-12-09 MRN: [id number]   Date of last office visit: 04/02/24 ALEENE PASSE, MD Date of next office visit: NONE   Request for Surgical Clearance    Procedure:  LEFT REVERSE TOTAL SHOULDER ARTHROPLASTY  Date of Surgery:  Clearance TBD                                Surgeon:  FONDA OLMSTED, MD Surgeon's Group or Practice Name:  [facility] Phone number:  [phone]  EXT 3132 Fax number:  [phone]   ATTN: MAEOLA DIVERS   Type of Clearance Requested:   - Medical  - Pharmacy:  Hold Aspirin  and Clopidogrel  (Plavix )     Type of Anesthesia:  General    Additional requests/questions:    SignedLucie DELENA Ku   08/19/2024, 10:51 AM

## 2024-08-19 NOTE — Progress Notes (Unsigned)
 Virtual Visit via Telephone Note   Because of [name] co-morbid illnesses, he is at least at moderate risk for complications without adequate follow up.  This format is felt to be most appropriate for this patient at this time.  Due to technical limitations with video connection (technology), today's appointment will be conducted as an audio only telehealth visit, and [name] verbally agreed to proceed in this manner.   All issues noted in this document were discussed and addressed.  No physical exam could be performed with this format.  Evaluation Performed:  Preoperative cardiovascular risk assessment _____________   Date:  08/19/2024   Patient ID:  Shawn Daniels, DOB 23-Aug-1956, MRN [id number] Patient Location:  Home Provider location:   Office  Primary Care Provider:  Rosamond Leta NOVAK, MD Primary Cardiologist:  Aleene Passe, MD (Inactive)  Chief Complaint / Patient Profile   68 y.o. y/o male with a h/o coronary calcifications, DM type II, sinus tachycardia, hypercholesteremia, HTN, PAD s/p bilateral lower extremity angiogram with shockwave lithotripsy drug-coated balloon dilation 02/2024 who is pending reverse total shoulder arthroplasty and presents today for telephonic preoperative cardiovascular risk assessment.  History of Present Illness    Shawn Daniels is a 68 y.o. male who presents via audio/video conferencing for a telehealth visit today.  Pt was last seen in cardiology clinic on 04/02/2024 by her Nahser.  At that time Shawn Daniels was doing well with some shortness of breath but no cardiac problems..  The patient is now pending procedure as outlined above. Since his last visit, he ***  *** denies chest pain, shortness of breath, lower extremity edema, fatigue, palpitations, melena, hematuria, hemoptysis, diaphoresis, weakness, presyncope, syncope, orthopnea, and PND.   Plavix  and ASA are not managed by cardiology service. Please reach out  to vascular surgery for recommendations on holding Plavix .   Past Medical History    Past Medical History:  Diagnosis Date   Diabetes mellitus without complication (HCC)    GERD (gastroesophageal reflux disease)    Hypertension    Peripheral vascular disease (HCC)    Past Surgical History:  Procedure Laterality Date   ABDOMINAL AORTOGRAM W/LOWER EXTREMITY Right 08/20/2022   Procedure: ABDOMINAL AORTOGRAM W/LOWER EXTREMITY;  Surgeon: Serene Gaile ORN, MD;  [facility];  Service: Cardiovascular;  Laterality: Right;   ABDOMINAL AORTOGRAM W/LOWER EXTREMITY N/A 11/19/2022   Procedure: ABDOMINAL AORTOGRAM W/LOWER EXTREMITY;  Surgeon: Serene Gaile ORN, MD;  [facility];  Service: Cardiovascular;  Laterality: N/A;   ABDOMINAL AORTOGRAM W/LOWER EXTREMITY Bilateral 02/24/2024   Procedure: ABDOMINAL AORTOGRAM W/LOWER EXTREMITY;  Surgeon: Serene Gaile ORN, MD;  [facility];  Service: Cardiovascular;  Laterality: Bilateral;   ENDARTERECTOMY FEMORAL Right 04/26/2022   Procedure: RIGHT FEMORAL ENDARTERECTOMY;  Surgeon: Serene Gaile ORN, MD;  [facility];  Service: Vascular;  Laterality: Right;   IR KYPHO LUMBAR INC FX REDUCE BONE BX UNI/BIL CANNULATION INC/IMAGING  01/13/2023   IR RADIOLOGIST EVAL & MGMT  01/29/2023   KNEE SURGERY     LOWER EXTREMITY ANGIOGRAPHY N/A 01/01/2022   Procedure: LOWER EXTREMITY ANGIOGRAPHY;  Surgeon: Elmira Newman PARAS, MD;  [facility];  Service: Cardiovascular;  Laterality: N/A;   PATCH ANGIOPLASTY Right 04/26/2022   Procedure: PATCH ANGIOPLASTY OF RIGHT FEMORAL ARTERY USING XENOSURE BOVINE PERCARDIUM PATCH;  Surgeon: Serene Gaile ORN, MD;  [facility];  Service: Vascular;  Laterality: Right;   PENILE PROSTHESIS IMPLANT  PERIPHERAL INTRAVASCULAR LITHOTRIPSY Left 11/19/2022   Procedure: INTRAVASCULAR LITHOTRIPSY;  Surgeon: Serene Gaile ORN, MD;  [facility];  Service: Cardiovascular;   Laterality: Left;   PERIPHERAL INTRAVASCULAR LITHOTRIPSY Left 02/24/2024   Procedure: PERIPHERAL INTRAVASCULAR LITHOTRIPSY;  Surgeon: Serene Gaile ORN, MD;  [facility];  Service: Cardiovascular;  Laterality: Left;  LT SFA   PERIPHERAL VASCULAR ATHERECTOMY  08/20/2022   Procedure: PERIPHERAL VASCULAR ATHERECTOMY;  Surgeon: Serene Gaile ORN, MD;  [facility];  Service: Cardiovascular;;  popliteal   PERIPHERAL VASCULAR BALLOON ANGIOPLASTY  01/08/2022   Procedure: PERIPHERAL VASCULAR BALLOON ANGIOPLASTY;  Surgeon: Ladona Heinz, MD;  [facility];  Service: Cardiovascular;;   PERIPHERAL VASCULAR BALLOON ANGIOPLASTY  02/24/2024   Procedure: PERIPHERAL VASCULAR BALLOON ANGIOPLASTY;  Surgeon: Serene Gaile ORN, MD;  [facility];  Service: Cardiovascular;;  Rt SFA   PERIPHERAL VASCULAR INTERVENTION Left 11/19/2022   Procedure: PERIPHERAL VASCULAR INTERVENTION;  Surgeon: Serene Gaile ORN, MD;  [facility];  Service: Cardiovascular;  Laterality: Left;   WRIST SURGERY      Allergies  Allergies  Allergen Reactions   Codeine Itching and Other (See Comments)    Can take/tolerate in lower doses   Keflex [Cephalexin] Other (See Comments)    Shriveled my skin   Penicillins Other (See Comments)    Reaction from childhood not recalled    Home Medications    Prior to Admission medications   Medication Sig Start Date End Date Taking? Authorizing Provider  Abaloparatide  (TYMLOS ) 3120 MCG/1.56ML SOPN Inject 80 mcg into the skin daily. 10/27/23   Cleatrice Ludie Depaulo, MD  aspirin  EC 81 MG tablet Take 1 tablet (81 mg total) by mouth at bedtime. 01/15/23   [place], Camellia PARAS, DO  clopidogrel  (PLAVIX ) 75 MG tablet Take 1 tablet by mouth once daily 06/22/24   Nahser, Aleene PARAS, MD  Evolocumab  (REPATHA  SURECLICK) 140 MG/ML SOAJ INJECT 1 PEN (140 MG) SUBCUTANEOUSLY EVERY 14 DAYS 12/04/23   Nahser, Aleene PARAS, MD  fenofibrate  (TRICOR ) 145 MG tablet  Take 1 tablet by mouth once daily 06/04/24   Nahser, Aleene PARAS, MD  icosapent  Ethyl (VASCEPA ) 1 g capsule Take 2 capsules (2 g total) by mouth 2 (two) times daily. Patient not taking: Reported on 04/05/2024 10/27/23   Nahser, Aleene PARAS, MD  Insulin  Pen Needle (ASSURE ID DUO PRO PEN NEEDLES) 31G X 5 MM MISC 1 each by Does not apply route daily. 10/27/23   H[place], Ludie Trauger, MD  JARDIANCE 25 MG TABS tablet Take 25 mg by mouth daily. 01/19/24   [provider]  LANTUS  SOLOSTAR 100 UNIT/ML Solostar Pen Inject 36 Units into the skin at bedtime. 03/30/24   [provider]  lisinopril  (ZESTRIL ) 5 MG tablet Take 5 mg by mouth in the morning and at bedtime. 06/08/23   [provider]  Magnesium  400 MG CAPS Take 400 mg by mouth every evening.    [provider]  metoprolol  succinate (TOPROL -XL) 25 MG 24 hr tablet Take 1 tablet by mouth once daily 12/25/23   Nahser, Aleene PARAS, MD  Multiple Vitamin (MULTIVITAMIN WITH MINERALS) TABS tablet Take 1 tablet by mouth every evening.    [provider]  NOVOLOG  FLEXPEN 100 UNIT/ML FlexPen Inject 8-16 Units into the skin 2 (two) times daily before a meal. 10/14/23   [provider]  omeprazole (PRILOSEC) 20 MG capsule Take 20 mg by mouth daily before breakfast.  [provider]  oxyCODONE -acetaminophen  (PERCOCET/ROXICET) 5-325 MG tablet Take 1 tablet by mouth every 8 (eight) hours as needed for severe pain (pain score 7-10). 04/20/24   Arvell Evalene Blick, DO  rosuvastatin  (CRESTOR ) 10 MG tablet Take 10 mg by mouth at bedtime.    [provider]    Physical Exam    Vital Signs:  SIGISMUND CROSS does not have vital signs available for review today.***  Given telephonic nature of communication, physical exam is limited. AAOx3. NAD. Normal affect.  Speech and respirations are unlabored.  Accessory Clinical Findings    None  Assessment & Plan    1.  Preoperative Cardiovascular Risk Assessment: -  Patient's RCRI score is 0.9%  The patient was advised that if he develops new symptoms prior to surgery to contact our office to arrange for a follow-up visit, and he verbalized understanding.  (Reminder: Include SBE prophylaxis/Antiplatelet/Anticoag Instructions***)  A copy of this note will be routed to requesting surgeon.  Time:   Today, I have spent *** minutes with the patient with telehealth technology discussing medical history, symptoms, and management plan.     Wyn Raddle, Jackee Shove, NP  08/19/2024, 2:06 PM

## 2024-08-19 NOTE — Telephone Encounter (Signed)
   Name: Shawn Daniels  DOB: 1956-11-02  MRN: [id number]  Primary Cardiologist: Aleene Passe, MD (Inactive)   Preoperative team, please contact this patient and set up a phone call appointment for further preoperative risk assessment. Please obtain consent and complete medication review. Thank you for your help.  I confirm that guidance regarding antiplatelet and oral anticoagulation therapy has been completed and, if necessary, noted below.  Plavix  and ASA are not managed by cardiology service. Please reach out to vascular surgery for recommendations on holding Plavix .   I also confirmed the patient resides in the state of [place] . As per [facility] Medical Board telemedicine laws, the patient must reside in the state in which the provider is licensed.   Wyn Raddle, Jackee Shove, NP 08/19/2024, 12:50 PM [facility]

## 2024-08-19 NOTE — Telephone Encounter (Signed)
  Patient Consent for Virtual Visit         [name] has provided verbal consent on 08/19/2024 for a virtual visit (video or telephone).  Appointment scheduled for 08/20/2024 @ 11:40, add in slot, per CHARLENA Alberts, NP, Med req and consent.   CONSENT FOR VIRTUAL VISIT FOR:  Shawn Daniels  By participating in this virtual visit I agree to the following:  I hereby voluntarily request, consent and authorize [facility] and its employed or contracted physicians, physician assistants, nurse practitioners or other licensed health care professionals (the Practitioner), to provide me with telemedicine health care services (the "Services) as deemed necessary by the treating Practitioner. I acknowledge and consent to receive the Services by the Practitioner via telemedicine. I understand that the telemedicine visit will involve communicating with the Practitioner through live audiovisual communication technology and the disclosure of certain medical information by electronic transmission. I acknowledge that I have been given the opportunity to request an in-person assessment or other available alternative prior to the telemedicine visit and am voluntarily participating in the telemedicine visit.  I understand that I have the right to withhold or withdraw my consent to the use of telemedicine in the course of my care at any time, without affecting my right to future care or treatment, and that the Practitioner or I may terminate the telemedicine visit at any time. I understand that I have the right to inspect all information obtained and/or recorded in the course of the telemedicine visit and may receive copies of available information for a reasonable fee.  I understand that some of the potential risks of receiving the Services via telemedicine include:  Delay or interruption in medical evaluation due to technological equipment failure or disruption; Information transmitted may not be  sufficient (e.g. poor resolution of images) to allow for appropriate medical decision making by the Practitioner; and/or  In rare instances, security protocols could fail, causing a breach of personal health information.  Furthermore, I acknowledge that it is my responsibility to provide information about my medical history, conditions and care that is complete and accurate to the best of my ability. I acknowledge that Practitioner's advice, recommendations, and/or decision may be based on factors not within their control, such as incomplete or inaccurate data provided by me or distortions of diagnostic images or specimens that may result from electronic transmissions. I understand that the practice of medicine is not an exact science and that Practitioner makes no warranties or guarantees regarding treatment outcomes. I acknowledge that a copy of this consent can be made available to me via my patient portal [facility] MyChart), or I can request a printed copy by calling the office of [facility].    I understand that my insurance will be billed for this visit.   I have read or had this consent read to me. I understand the contents of this consent, which adequately explains the benefits and risks of the Services being provided via telemedicine.  I have been provided ample opportunity to ask questions regarding this consent and the Services and have had my questions answered to my satisfaction. I give my informed consent for the services to be provided through the use of telemedicine in my medical care

## 2024-08-20 ENCOUNTER — Ambulatory Visit: Attending: Internal Medicine

## 2024-08-20 DIAGNOSIS — Z01810 Encounter for preprocedural cardiovascular examination: Secondary | ICD-10-CM

## 2024-09-01 ENCOUNTER — Telehealth: Payer: Self-pay

## 2024-09-01 NOTE — Telephone Encounter (Signed)
 Joen, surgery scheduler,  from [facility] called asking about holding patient's ASA and plavix  prior to his Left reverse total shoulder arthroplasty scheduled on 10/05/24.  ASA - no hold Plavix  - hold 5 days

## 2024-09-14 ENCOUNTER — Other Ambulatory Visit: Payer: Self-pay

## 2024-09-15 MED ORDER — METOPROLOL SUCCINATE ER 25 MG PO TB24: 25.0000 mg | ORAL_TABLET | Freq: Every day | ORAL | 1 refills | Status: AC

## 2024-09-20 NOTE — H&P (View-Only) (Signed)
Sent message, via epic in basket, requesting order in epic from surgeon  

## 2024-09-20 NOTE — Progress Notes (Signed)
Sent message, via epic in basket, requesting order in epic from surgeon  

## 2024-09-24 NOTE — Progress Notes (Addendum)
 Anesthesia Review:  PCP:  Vyas - 08/20/24 clearance  Cardiologist :Nahser LOV 04/02/24  [facility]- BrAbhim-  matthew Eveland,PAc LOV 07/19/24  Clearance 08/26/24 under 09/01/24- Media Tab   PPM/ ICD: Device Orders: Rep Notified:  Chest x-ray : EKG : 10/15/23  Echo : 02/07/23  Peripheral Vas Cath- 02/24/24  Stress test: 2022  Cardiac Cath :   Activity level:  can do a flight of stairs without difficutly  Sleep Study/ CPAP : none  Fasting Blood Sugar :      / Checks Blood Sugar -- times a day:     DM- 1 per pt Dexcom and also manual checks  Type 1 Hgba1c- 09/27/2024 -6.9 Mounjaro- last dose on  09/27/2024  Jardiance- last dose on 10/01/2024  Lantus - pt takes in the am per pt - take 80% of dose am of procedure which is 28 units  Novolog  with meals-  none am of surgery   Tmylos- pt tochecks with surgeon in regards as to when to stop   Blood Thinner/ Instructions /Last Dose: ASA / Instructions/ Last Dose :    81 mg aspirin   Plasvix- last dose on  09/27/2024 per pt

## 2024-09-24 NOTE — Patient Instructions (Signed)
 SURGICAL WAITING ROOM VISITATION  Patients having surgery or a procedure may have no more than 2 support people in the waiting area - these visitors may rotate.    Children under the age of 44 must have an adult with them who is not the patient.  Visitors with respiratory illnesses are discouraged from visiting and should remain at home.  If the patient needs to stay at the hospital during part of their recovery, the visitor guidelines for inpatient rooms apply. Pre-op nurse will coordinate an appropriate time for 1 support person to accompany patient in pre-op.  This support person may not rotate.    Please refer to the [facility] website for the visitor guidelines for Inpatients (after your surgery is over and you are in a regular room).       Your procedure is scheduled on:  10/05/2024    Report to [facility] Main Entrance    Report to admitting at   [phone]   Call this number if you have problems the morning of surgery [phone]   Do not eat food :After Midnight.   After Midnight you may have the following liquids until __ 0745____ AM DAY OF SURGERY  Water  Non-Citrus Juices (without pulp, NO RED-Apple, White grape, White cranberry) Black Coffee (NO MILK/CREAM OR CREAMERS, sugar ok)  Clear Tea (NO MILK/CREAM OR CREAMERS, sugar ok) regular and decaf                             Plain Jell-O (NO RED)                                           Fruit ices (not with fruit pulp, NO RED)                                     Popsicles (NO RED)                                                               Sports drinks like Gatorade (NO RED)                    The day of surgery:  Drink ONE (1) Pre-Surgery Clear Ensure or G2 at  0745AM the morning of surgery. Drink in one sitting. Do not sip.  This drink was given to you during your hospital  pre-op appointment visit. Nothing else to drink after completing the  Pre-Surgery Clear Ensure or G2.          If you have  questions, please contact your surgeon's office.       Oral Hygiene is also important to reduce your risk of infection.                                    Remember - BRUSH YOUR TEETH THE MORNING OF SURGERY WITH YOUR REGULAR TOOTHPASTE  DENTURES WILL BE REMOVED PRIOR TO SURGERY PLEASE DO NOT APPLY Poly grip OR ADHESIVES!!!  Do NOT smoke after Midnight   Stop all vitamins and herbal supplements 7 days before surgery.   Take these medicines the morning of surgery with A SIP OF WATER :  toprol , omeprazole,            Jardiance- last dose on 10/01/2024           Lantus -            Novolog  with nmeals-           Mounjaro- last dose on   DO NOT TAKE ANY ORAL DIABETIC MEDICATIONS DAY OF YOUR SURGERY  Bring CPAP mask and tubing day of surgery.                              You may not have any metal on your body including hair pins, jewelry, and body piercing             Do not wear make-up, lotions, powders, perfumes/cologne, or deodorant  Do not wear nail polish including gel and S&S, artificial/acrylic nails, or any other type of covering on natural nails including finger and toenails. If you have artificial nails, gel coating, etc. that needs to be removed by a nail salon please have this removed prior to surgery or surgery may need to be canceled/ delayed if the surgeon/ anesthesia feels like they are unable to be safely monitored.   Do not shave  48 hours prior to surgery.               Men may shave face and neck.   Do not bring valuables to the hospital. [facility] IS NOT             RESPONSIBLE   FOR VALUABLES.   Contacts, glasses, dentures or bridgework may not be worn into surgery.   Bring small overnight bag day of surgery.   DO NOT BRING YOUR HOME MEDICATIONS TO THE HOSPITAL. PHARMACY WILL DISPENSE MEDICATIONS LISTED ON YOUR MEDICATION LIST TO YOU DURING YOUR ADMISSION IN THE HOSPITAL!    Patients discharged on the day of surgery will not be allowed to drive home.   Someone NEEDS to stay with you for the first 24 hours after anesthesia.   Special Instructions: Bring a copy of your healthcare power of attorney and living will documents the day of surgery if you haven't scanned them before.              Please read over the following fact sheets you were given: IF YOU HAVE QUESTIONS ABOUT YOUR PRE-OP INSTRUCTIONS PLEASE CALL [phone].   If you received a COVID test during your pre-op visit  it is requested that you wear a mask when out in public, stay away from anyone that may not be feeling well and notify your surgeon if you develop symptoms. If you test positive for Covid or have been in contact with anyone that has tested positive in the last 10 days please notify you surgeon.      Pre-operative 4 CHG Bath Instructions   You can play a key role in reducing the risk of infection after surgery. Your skin needs to be as free of germs as possible. You can reduce the number of germs on your skin by washing with CHG (chlorhexidine  gluconate) soap before surgery. CHG is an antiseptic soap that kills germs and continues to kill germs even after washing.   DO NOT use if you have  an allergy to chlorhexidine /CHG or antibacterial soaps. If your skin becomes reddened or irritated, stop using the CHG and notify one of our RNs at [phone].   Please shower with the CHG soap starting 4 days before surgery using the following schedule:     Please keep in mind the following:  DO NOT shave, including legs and underarms, starting the day of your first shower.   You may shave your face at any point before/day of surgery.  Place clean sheets on your bed the day you start using CHG soap. Use a clean washcloth (not used since being washed) for each shower. DO NOT sleep with pets once you start using the CHG.   CHG Shower Instructions:  If you choose to wash your hair and private area, wash first with your normal shampoo/soap.  After you use shampoo/soap, rinse your  hair and body thoroughly to remove shampoo/soap residue.  Turn the water  OFF and apply about 3 tablespoons (45 ml) of CHG soap to a CLEAN washcloth.  Apply CHG soap ONLY FROM YOUR NECK DOWN TO YOUR TOES (washing for 3-5 minutes)  DO NOT use CHG soap on face, private areas, open wounds, or sores.  Pay special attention to the area where your surgery is being performed.  If you are having back surgery, having someone wash your back for you may be helpful. Wait 2 minutes after CHG soap is applied, then you may rinse off the CHG soap.  Pat dry with a clean towel  Put on clean clothes/pajamas   If you choose to wear lotion, please use ONLY the CHG-compatible lotions on the back of this paper.     Additional instructions for the day of surgery: DO NOT APPLY any lotions, deodorants, cologne, or perfumes.   Put on clean/comfortable clothes.  Brush your teeth.  Ask your nurse before applying any prescription medications to the skin.      CHG Compatible Lotions   Aveeno Moisturizing lotion  Cetaphil Moisturizing Cream  Cetaphil Moisturizing Lotion  Clairol Herbal Essence Moisturizing Lotion, Dry Skin  Clairol Herbal Essence Moisturizing Lotion, Extra Dry Skin  Clairol Herbal Essence Moisturizing Lotion, Normal Skin  Curel Age Defying Therapeutic Moisturizing Lotion with Alpha Hydroxy  Curel Extreme Care Body Lotion  Curel Soothing Hands Moisturizing Hand Lotion  Curel Therapeutic Moisturizing Cream, Fragrance-Free  Curel Therapeutic Moisturizing Lotion, Fragrance-Free  Curel Therapeutic Moisturizing Lotion, Original Formula  Eucerin Daily Replenishing Lotion  Eucerin Dry Skin Therapy Plus Alpha Hydroxy Crme  Eucerin Dry Skin Therapy Plus Alpha Hydroxy Lotion  Eucerin Original Crme  Eucerin Original Lotion  Eucerin Plus Crme Eucerin Plus Lotion  Eucerin TriLipid Replenishing Lotion  Keri Anti-Bacterial Hand Lotion  Keri Deep Conditioning Original Lotion Dry Skin Formula Softly  Scented  Keri Deep Conditioning Original Lotion, Fragrance Free Sensitive Skin Formula  Keri Lotion Fast Absorbing Fragrance Free Sensitive Skin Formula  Keri Lotion Fast Absorbing Softly Scented Dry Skin Formula  Keri Original Lotion  Keri Skin Renewal Lotion Keri Silky Smooth Lotion  Keri Silky Smooth Sensitive Skin Lotion  Nivea Body Creamy Conditioning Oil  Nivea Body Extra Enriched Lotion  Nivea Body Original Lotion  Nivea Body Sheer Moisturizing Lotion Nivea Crme  Nivea Skin Firming Lotion  NutraDerm 30 Skin Lotion  NutraDerm Skin Lotion  NutraDerm Therapeutic Skin Cream  NutraDerm Therapeutic Skin Lotion  ProShield Protective Hand Cream  Provon moisturizing lotion  [place]- Preparing for Total Shoulder Arthroplasty    Before surgery, you can play an important  role. Because skin is not sterile, your skin needs to be as free of germs as possible. You can reduce the number of germs on your skin by using the following products. Benzoyl Peroxide Gel Reduces the number of germs present on the skin Applied twice a day to shoulder area starting two days before surgery    ==================================================================  Please follow these instructions carefully:  BENZOYL PEROXIDE 5% GEL  Please do not use if you have an allergy to benzoyl peroxide.   If your skin becomes reddened/irritated stop using the benzoyl peroxide.  Starting two days before surgery, apply as follows: Apply benzoyl peroxide in the morning and at night. Apply after taking a shower. If you are not taking a shower clean entire shoulder front, back, and side along with the armpit with a clean wet washcloth.  Place a quarter-sized dollop on your shoulder and rub in thoroughly, making sure to cover the front, back, and side of your shoulder, along with the armpit.   2 days before ____ AM   ____ PM              1 day before ____ AM   ____ PM                         Do this twice a day for  two days.  (Last application is the night before surgery, AFTER using the CHG soap as described below).  Do NOT apply benzoyl peroxide gel on the day of surgery.

## 2024-09-27 ENCOUNTER — Encounter (HOSPITAL_COMMUNITY)
Admission: RE | Admit: 2024-09-27 | Discharge: 2024-09-27 | Disposition: A | Discharge status: Home / Self Care | Source: Ambulatory Visit | Attending: Orthopedic Surgery | Admitting: Orthopedic Surgery

## 2024-09-27 ENCOUNTER — Other Ambulatory Visit: Payer: Self-pay

## 2024-09-27 ENCOUNTER — Encounter (HOSPITAL_COMMUNITY): Payer: Self-pay

## 2024-09-27 VITALS — BP 151/77 | HR 84 | Temp 98.4°F | Resp 16 | Ht 72.0 in | Wt 192.0 lb

## 2024-09-27 DIAGNOSIS — Z7982 Long term (current) use of aspirin: Secondary | ICD-10-CM | POA: Insufficient documentation

## 2024-09-27 DIAGNOSIS — M19012 Primary osteoarthritis, left shoulder: Secondary | ICD-10-CM | POA: Diagnosis not present

## 2024-09-27 DIAGNOSIS — E1151 Type 2 diabetes mellitus with diabetic peripheral angiopathy without gangrene: Secondary | ICD-10-CM | POA: Diagnosis not present

## 2024-09-27 DIAGNOSIS — Z7984 Long term (current) use of oral hypoglycemic drugs: Secondary | ICD-10-CM | POA: Diagnosis not present

## 2024-09-27 DIAGNOSIS — Z01818 Encounter for other preprocedural examination: Secondary | ICD-10-CM | POA: Diagnosis present

## 2024-09-27 DIAGNOSIS — I10 Essential (primary) hypertension: Secondary | ICD-10-CM | POA: Diagnosis not present

## 2024-09-27 DIAGNOSIS — Z794 Long term (current) use of insulin: Secondary | ICD-10-CM | POA: Diagnosis not present

## 2024-09-27 DIAGNOSIS — Z7985 Long-term (current) use of injectable non-insulin antidiabetic drugs: Secondary | ICD-10-CM | POA: Insufficient documentation

## 2024-09-27 DIAGNOSIS — Z7902 Long term (current) use of antithrombotics/antiplatelets: Secondary | ICD-10-CM | POA: Insufficient documentation

## 2024-09-27 DIAGNOSIS — K219 Gastro-esophageal reflux disease without esophagitis: Secondary | ICD-10-CM | POA: Insufficient documentation

## 2024-09-27 DIAGNOSIS — Z95820 Peripheral vascular angioplasty status with implants and grafts: Secondary | ICD-10-CM | POA: Diagnosis not present

## 2024-09-27 DIAGNOSIS — Z01812 Encounter for preprocedural laboratory examination: Secondary | ICD-10-CM | POA: Insufficient documentation

## 2024-09-27 HISTORY — DX: Unspecified osteoarthritis, unspecified site: M19.90

## 2024-09-27 HISTORY — DX: Personal history of urinary calculi: Z87.442

## 2024-09-27 LAB — BASIC METABOLIC PANEL WITH GFR
Anion gap: 13 (ref 5–15)
BUN: 31 mg/dL — ABNORMAL HIGH (ref 8–23)
CO2: 24 mmol/L (ref 22–32)
Calcium: 10.6 mg/dL — ABNORMAL HIGH (ref 8.9–10.3)
Chloride: 99 mmol/L (ref 98–111)
Creatinine, Ser: 1.16 mg/dL (ref 0.61–1.24)
GFR, Estimated: >60 mL/min (ref >60)
Glucose, Bld: 109 mg/dL — ABNORMAL HIGH (ref 70–99)
Potassium: 4.4 mmol/L (ref 3.5–5.1)
Sodium: 136 mmol/L (ref 135–145)

## 2024-09-27 LAB — HEMOGLOBIN A1C
Hgb A1c MFr Bld: 6.9 % — ABNORMAL HIGH (ref 4.8–5.6)
Mean Plasma Glucose: 151.33 mg/dL

## 2024-09-27 LAB — CBC
HCT: 39.1 % (ref 39.0–52.0)
Hemoglobin: 13.4 g/dL (ref 13.0–17.0)
MCH: 32.1 pg (ref 26.0–34.0)
MCHC: 34.3 g/dL (ref 30.0–36.0)
MCV: 93.5 fL (ref 80.0–100.0)
Platelets: 215 K/uL (ref 150–400)
RBC: 4.18 MIL/uL — ABNORMAL LOW (ref 4.22–5.81)
RDW: 12.6 % (ref 11.5–15.5)
WBC: 6.4 K/uL (ref 4.0–10.5)
nRBC: 0 % (ref 0.0–0.2)

## 2024-09-27 LAB — GLUCOSE, CAPILLARY: Glucose-Capillary: 117 mg/dL — ABNORMAL HIGH (ref 70–99)

## 2024-09-27 LAB — SURGICAL PCR SCREEN
MRSA, PCR: NEGATIVE
Staphylococcus aureus: POSITIVE — AB

## 2024-09-28 NOTE — Progress Notes (Signed)
 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 former smoker with h/o HTN, GERD, PVD, DM II, left shoulder OA scheduled for above procedure 10/05/2024 with Dr. Chew Josefina.   Per cardiology preoperative evaluation 08/20/2024,  Patient's RCRI score is 0.9%   The patient affirms he has been doing well without any new cardiac symptoms. They are able to achieve 7 METS without cardiac limitations. Therefore, based on ACC/AHA guidelines, the patient would be at acceptable risk for the planned procedure without further cardiovascular testing. The patient was advised that if he develops new symptoms prior to surgery to contact our office to arrange for a follow-up visit, and he verbalized understanding.    The patient was advised that if he develops new symptoms prior to surgery to contact our office to arrange for a follow-up visit, and he verbalized understanding.   Plavix  and ASA are not managed by cardiology service. Please reach out to vascular services for recommendations on holding Plavix .  Pt last seen by vascular 07/19/2024. Per OV note, PAD with numerous interventions in the past including right iliofemoral endarterectomy with bovine patch angioplasty in May 2023 by Dr. Serene.  He has also had orbital atherectomy and DCB angioplasty and stenting of the right popliteal artery.  He also underwent lithotripsy and drug-eluting stent placement of the left popliteal artery due to an occluded popliteal.  Most recently he underwent shockwave lithotripsy with left SFA drug-coated balloon angioplasty on 02/24/2024 by Dr. Serene.  He denies any claudication rest pain or tissue  loss of bilateral lower extremities.  Last dose of Plavix  09/27/2024.   Pt advised to hold Mounjaro 1 week prior to procedure.   VS: BP (!) 151/77   Pulse 84   Temp 36.9 C (Oral)   Resp 16   Ht 6' (1.829 m)   Wt 87.1 kg   SpO2 96%   BMI 26.04 kg/m   PROVIDERS: Rosamond Leta NOVAK, MD is PCP    LABS: Labs reviewed: Acceptable for surgery. (all labs ordered are listed, but only abnormal results are displayed)  Labs Reviewed  SURGICAL PCR SCREEN - Abnormal; Notable for the following components:      Result Value   Staphylococcus aureus POSITIVE (*)    All other components within normal limits  HEMOGLOBIN A1C - Abnormal; Notable for the following components:   Hgb A1c MFr Bld 6.9 (*)    All other components within normal limits  BASIC METABOLIC PANEL WITH GFR - Abnormal; Notable for the following components:   Glucose, Bld 109 (*)    BUN 31 (*)    Calcium  10.6 (*)    All other components within normal limits  CBC - Abnormal; Notable for the following components:   RBC 4.18 (*)    All other components within normal limits  GLUCOSE, CAPILLARY - Abnormal; Notable for the following components:   Glucose-Capillary 117 (*)    All other components within normal limits     IMAGES:   EKG:   CV: Echo 02/07/2023  1. Left ventricular ejection fraction, by estimation, is 60 to 65%. The  left ventricle has normal function. The left ventricle has no regional  wall  motion abnormalities. There is mild concentric left ventricular  hypertrophy. Left ventricular diastolic  parameters are consistent with Grade I diastolic dysfunction (impaired  relaxation).   2. Right ventricular systolic function is normal. The right ventricular  size is normal.   3. Left atrial size was mildly dilated.   4. Right atrial size was mildly dilated.   5. The mitral valve is normal in structure. Trivial mitral valve  regurgitation. No evidence of mitral stenosis.   6. The aortic valve is normal in  structure. Aortic valve regurgitation is  not visualized. No aortic stenosis is present.   7. The inferior vena cava is normal in size with greater than 50%  respiratory variability, suggesting right atrial pressure of 3 mmHg.   Exercise Sestamibi stress test 10/01/2021: Exercise nuclear stress test was performed using Bruce protocol. Patient reached 5.8 METS, and 87% of age predicted maximum heart rate. Exercise capacity was low. No chest pain reported. Heart rate and hemodynamic response were normal. Stress EKG revealed no ischemic changes. Mildly decreased tracer uptake in inferior myocardium likely due to diaphragmatic attenuation. Normal wall motion and thickening. Stress LVEF calculated 44%, but visually appears 50-55%. Low risk study. Past Medical History:  Diagnosis Date   Arthritis    Diabetes mellitus without complication (HCC)    GERD (gastroesophageal reflux disease)    History of kidney stones    Hypertension    Peripheral vascular disease     Past Surgical History:  Procedure Laterality Date   ABDOMINAL AORTOGRAM W/LOWER EXTREMITY Right 08/20/2022   Procedure: ABDOMINAL AORTOGRAM W/LOWER EXTREMITY;  Surgeon: Serene Gaile ORN, MD;  [facility];  Service: Cardiovascular;  Laterality: Right;   ABDOMINAL AORTOGRAM W/LOWER EXTREMITY N/A 11/19/2022   Procedure: ABDOMINAL AORTOGRAM W/LOWER EXTREMITY;  Surgeon: Serene Gaile ORN, MD;  Location: [facility];  Service: Cardiovascular;  Laterality: N/A;   ABDOMINAL AORTOGRAM W/LOWER EXTREMITY Bilateral 02/24/2024   Procedure: ABDOMINAL AORTOGRAM W/LOWER EXTREMITY;  Surgeon: Serene Gaile ORN, MD;  [facility]: [facility];  Service: Cardiovascular;  Laterality: Bilateral;   ENDARTERECTOMY FEMORAL Right 04/26/2022   Procedure: RIGHT FEMORAL ENDARTERECTOMY;  Surgeon: Serene Gaile ORN, MD;  Location: [facility];  Service: Vascular;  Laterality: Right;   IR KYPHO LUMBAR INC FX REDUCE BONE BX UNI/BIL CANNULATION INC/IMAGING   01/13/2023   IR RADIOLOGIST EVAL & MGMT  01/29/2023   KNEE SURGERY     LOWER EXTREMITY ANGIOGRAPHY N/A 01/01/2022   Procedure: LOWER EXTREMITY ANGIOGRAPHY;  Surgeon: Elmira Newman PARAS, MD;  [facility];  Service: Cardiovascular;  Laterality: N/A;   PATCH ANGIOPLASTY Right 04/26/2022   Procedure: PATCH ANGIOPLASTY OF RIGHT FEMORAL ARTERY USING XENOSURE BOVINE PERCARDIUM PATCH;  Surgeon: Serene Gaile ORN, MD;  Location: [facility];  Service: Vascular;  Laterality: Right;   PENILE PROSTHESIS IMPLANT     PERIPHERAL INTRAVASCULAR LITHOTRIPSY Left 11/19/2022   Procedure: INTRAVASCULAR LITHOTRIPSY;  Surgeon: Serene Gaile ORN, MD;  [facility];  Service: Cardiovascular;  Laterality: Left;   PERIPHERAL INTRAVASCULAR LITHOTRIPSY Left 02/24/2024   Procedure: PERIPHERAL INTRAVASCULAR LITHOTRIPSY;  Surgeon: Serene Gaile ORN, MD;  [facility];  Service: Cardiovascular;  Laterality: Left;  LT SFA   PERIPHERAL VASCULAR ATHERECTOMY  08/20/2022   Procedure: PERIPHERAL VASCULAR ATHERECTOMY;  Surgeon: Serene Gaile ORN, MD;  [facility];  Service: Cardiovascular;;  popliteal   PERIPHERAL VASCULAR BALLOON ANGIOPLASTY  01/08/2022   Procedure: PERIPHERAL VASCULAR BALLOON ANGIOPLASTY;  Surgeon: Ladona,  Gordy, MD;  [facility];  Service: Cardiovascular;;   PERIPHERAL VASCULAR BALLOON ANGIOPLASTY  02/24/2024   Procedure: PERIPHERAL VASCULAR BALLOON ANGIOPLASTY;  Surgeon: Serene Gaile ORN, MD;  [facility];  Service: Cardiovascular;;  Rt SFA   PERIPHERAL VASCULAR INTERVENTION Left 11/19/2022   Procedure: PERIPHERAL VASCULAR INTERVENTION;  Surgeon: Serene Gaile ORN, MD;  [facility];  Service: Cardiovascular;  Laterality: Left;   WRIST SURGERY      MEDICATIONS:  Abaloparatide  (TYMLOS ) 3120 MCG/1.56ML SOPN   aspirin  EC 81 MG tablet   clopidogrel  (PLAVIX ) 75 MG tablet   Evolocumab  (REPATHA  SURECLICK) 140 MG/ML SOAJ    fenofibrate  (TRICOR ) 145 MG tablet   Insulin  Pen Needle (ASSURE ID DUO PRO PEN NEEDLES) 31G X 5 MM MISC   JARDIANCE 25 MG TABS tablet   LANTUS  SOLOSTAR 100 UNIT/ML Solostar Pen   lisinopril  (ZESTRIL ) 5 MG tablet   Magnesium  400 MG CAPS   metoprolol  succinate (TOPROL -XL) 25 MG 24 hr tablet   MOUNJARO 2.5 MG/0.5ML Pen   Multiple Vitamin (MULTIVITAMIN WITH MINERALS) TABS tablet   NOVOLOG  FLEXPEN 100 UNIT/ML FlexPen   omeprazole (PRILOSEC) 20 MG capsule   rosuvastatin  (CRESTOR ) 10 MG tablet   No current facility-administered medications for this encounter.    iodixanol  (VISIPAQUE ) 320 MG/ML injection    [facility], PA-C [facility] Pre-Surgical Testing [phone]

## 2024-09-28 NOTE — Anesthesia Preprocedure Evaluation (Addendum)
 Anesthesia Evaluation  Patient identified by MRN, date of birth, ID band Patient awake    Reviewed: Allergy & Precautions, NPO status , Patient's Chart, lab work & pertinent test results  History of Anesthesia Complications Negative for: history of anesthetic complications  Airway Mallampati: III  TM Distance: >3 FB Neck ROM: Full    Dental no notable dental hx. (+) Teeth Intact   Pulmonary neg pulmonary ROS, neg sleep apnea, neg COPD, Patient abstained from smoking.Not current smoker, former smoker   Pulmonary exam normal breath sounds clear to auscultation       Cardiovascular Exercise Tolerance: Good METShypertension, + Peripheral Vascular Disease  (-) CAD and (-) Past MI (-) dysrhythmias  Rhythm:Regular Rate:Normal - Systolic murmurs    Neuro/Psych negative neurological ROS  negative psych ROS   GI/Hepatic ,GERD  Medicated and Controlled,,(+)     substance abuse  alcohol  useDrinks 3-4 times per week   Endo/Other  diabetes, Well Controlled, Type 1, Insulin  Dependent  GLP1 agonist held for > 1 week. Denies GI symptoms today  Renal/GU negative Renal ROS     Musculoskeletal   Abdominal   Peds  Hematology   Anesthesia Other Findings Past Medical History: No date: Arthritis No date: Diabetes mellitus without complication (HCC) No date: GERD (gastroesophageal reflux disease) No date: History of kidney stones No date: Hypertension No date: Peripheral vascular disease  Reproductive/Obstetrics                              Anesthesia Physical Anesthesia Plan  ASA: 3  Anesthesia Plan: General   Post-op Pain Management: Regional block* and Tylenol  PO (pre-op)*   Induction: Intravenous  PONV Risk Score and Plan: 2 and Ondansetron , Dexamethasone  and Midazolam   Airway Management Planned: Oral ETT  Additional Equipment: None  Intra-op Plan:   Post-operative Plan: Extubation in  OR  Informed Consent: I have reviewed the patients History and Physical, chart, labs and discussed the procedure including the risks, benefits and alternatives for the proposed anesthesia with the patient or authorized representative who has indicated his/her understanding and acceptance.     Dental advisory given  Plan Discussed with: CRNA and Surgeon  Anesthesia Plan Comments: (Discussed risks of anesthesia with patient, including PONV, sore throat, lip/dental/eye damage. Rare risks discussed as well, such as cardiorespiratory and neurological sequelae, and allergic reactions. Discussed the role of CRNA in patient's perioperative care. Patient understands. Discussed r/b/a of interscalene block, including elective nature. Risks discussed: - Rare: bleeding, infection, nerve damage - shortness of breath from hemidiaphragmatic paralysis - unilateral horner's syndrome - poor/non-working blocks - reactions and toxicity to local anesthetic Patient understands and agrees. )         Anesthesia Quick Evaluation

## 2024-10-04 NOTE — H&P (Signed)
 SHOULDER ARTHROPLASTY ADMISSION H&P  Patient ID: Shawn Daniels MRN: [id number] DOB/AGE: February 10, 1956 68 y.o.  Chief Complaint: left shoulder pain.  Planned Procedure Date: 10/05/24 Medical Clearance by Dr. Rosamond   Cardiac Clearance by Jackee Alberts, NP Additional clearance by Dr. Serene (vascular)   HPI: Shawn Daniels is a 68 y.o. male who presents for evaluation of Primary osteoarthritis of left shoulder. The patient has a history of pain and functional disability in the left shoulder due to arthritis and has failed non-surgical conservative treatments for greater than 12 weeks to include NSAID's and/or analgesics, corticosteriod injections, flexibility and strengthening excercises, and activity modification.  Onset of symptoms was abrupt, starting 6 months ago with rapidlly worsening course since that time. The patient noted no past surgery on the left shoulder.  Patient currently rates pain as mooderate with activity. Patient has worsening of pain with activity and weight bearing and pain that interferes with activities of daily living. There is no active infection.  Past Medical History:  Diagnosis Date   Arthritis    Diabetes mellitus without complication (HCC)    GERD (gastroesophageal reflux disease)    History of kidney stones    Hypertension    Peripheral vascular disease    Past Surgical History:  Procedure Laterality Date   ABDOMINAL AORTOGRAM W/LOWER EXTREMITY Right 08/20/2022   Procedure: ABDOMINAL AORTOGRAM W/LOWER EXTREMITY;  Surgeon: Serene Gaile ORN, MD;  Location: [facility];  Service: Cardiovascular;  Laterality: Right;   ABDOMINAL AORTOGRAM W/LOWER EXTREMITY N/A 11/19/2022   Procedure: ABDOMINAL AORTOGRAM W/LOWER EXTREMITY;  Surgeon: Serene Gaile ORN, MD;  Location: [facility];  Service: Cardiovascular;  Laterality: N/A;   ABDOMINAL AORTOGRAM W/LOWER EXTREMITY Bilateral 02/24/2024   Procedure: ABDOMINAL AORTOGRAM W/LOWER EXTREMITY;  Surgeon:  Serene Gaile ORN, MD;  Location: [facility];  Service: Cardiovascular;  Laterality: Bilateral;   ENDARTERECTOMY FEMORAL Right 04/26/2022   Procedure: RIGHT FEMORAL ENDARTERECTOMY;  Surgeon: Serene Gaile ORN, MD;  Location: [facility];  Service: Vascular;  Laterality: Right;   IR KYPHO LUMBAR INC FX REDUCE BONE BX UNI/BIL CANNULATION INC/IMAGING  01/13/2023   IR RADIOLOGIST EVAL & MGMT  01/29/2023   KNEE SURGERY     LOWER EXTREMITY ANGIOGRAPHY N/A 01/01/2022   Procedure: LOWER EXTREMITY ANGIOGRAPHY;  Surgeon: Elmira Newman PARAS, MD;  Location: [facility];  Service: Cardiovascular;  Laterality: N/A;   PATCH ANGIOPLASTY Right 04/26/2022   Procedure: PATCH ANGIOPLASTY OF RIGHT FEMORAL ARTERY USING XENOSURE BOVINE PERCARDIUM PATCH;  Surgeon: Serene Gaile ORN, MD;  Location: [facility];  Service: Vascular;  Laterality: Right;   PENILE PROSTHESIS IMPLANT     PERIPHERAL INTRAVASCULAR LITHOTRIPSY Left 11/19/2022   Procedure: INTRAVASCULAR LITHOTRIPSY;  Surgeon: Serene Gaile ORN, MD;  Location: [facility];  Service: Cardiovascular;  Laterality: Left;   PERIPHERAL INTRAVASCULAR LITHOTRIPSY Left 02/24/2024   Procedure: PERIPHERAL INTRAVASCULAR LITHOTRIPSY;  Surgeon: Serene Gaile ORN, MD;  Location: [facility];  Service: Cardiovascular;  Laterality: Left;  LT SFA   PERIPHERAL VASCULAR ATHERECTOMY  08/20/2022   Procedure: PERIPHERAL VASCULAR ATHERECTOMY;  Surgeon: Serene Gaile ORN, MD;  Location: [facility];  Service: Cardiovascular;;  popliteal   PERIPHERAL VASCULAR BALLOON ANGIOPLASTY  01/08/2022   Procedure: PERIPHERAL VASCULAR BALLOON ANGIOPLASTY;  Surgeon: Ladona Heinz, MD;  Location: [facility];  Service: Cardiovascular;;   PERIPHERAL VASCULAR BALLOON ANGIOPLASTY  02/24/2024   Procedure: PERIPHERAL VASCULAR BALLOON ANGIOPLASTY;  Surgeon: Serene Gaile ORN, MD;  Location[facility];  Service: Cardiovascular;;  Rt SFA   PERIPHERAL VASCULAR INTERVENTION Left 11/19/2022    Procedure: PERIPHERAL VASCULAR INTERVENTION;  Surgeon: Serene Gaile ORN, MD;  Location: [facility];  Service: Cardiovascular;  Laterality: Left;   WRIST SURGERY     Allergies  Allergen Reactions   Codeine Itching and Other (See Comments)    Can take/tolerate in lower doses   Keflex [Cephalexin] Other (See Comments)    Shriveled my skin   Penicillins Other (See Comments)    Reaction from childhood not recalled   Prior to Admission medications   Medication Sig Start Date End Date Taking? Authorizing Provider  Abaloparatide  (TYMLOS ) 3120 MCG/1.56ML SOPN Inject 80 mcg into the skin daily. 10/27/23  Yes Hudnall, Ludie Bahe, MD  aspirin  EC 81 MG tablet Take 1 tablet (81 mg total) by mouth at bedtime. 01/15/23  Yes [place], Camellia PARAS, DO  clopidogrel  (PLAVIX ) 75 MG tablet Take 1 tablet by mouth once daily 06/22/24  Yes Nahser, Aleene PARAS, MD  Evolocumab  (REPATHA  SURECLICK) 140 MG/ML SOAJ INJECT 1 PEN (140 MG) SUBCUTANEOUSLY EVERY 14 DAYS 12/04/23  Yes Nahser, Aleene PARAS, MD  fenofibrate  (TRICOR ) 145 MG tablet Take 1 tablet by mouth once daily 06/04/24  Yes Nahser, Aleene PARAS, MD  JARDIANCE 25 MG TABS tablet Take 25 mg by mouth daily. 01/19/24  Yes [provider]  LANTUS  SOLOSTAR 100 UNIT/ML Solostar Pen Inject 36 Units into the skin at bedtime. PT takes lantus  takes in am 03/30/24  Yes [provider]  lisinopril  (ZESTRIL ) 5 MG tablet Take 5 mg by mouth in the morning and at bedtime. Pt takes in pm 06/08/23  Yes [provider]  Magnesium  400 MG CAPS Take 400 mg by mouth in the morning and at bedtime.   Yes [provider]  metoprolol  succinate (TOPROL -XL) 25 MG 24 hr tablet Take 1 tablet (25 mg total) by mouth daily. 09/15/24  Yes West, Katlyn D, NP  MOUNJARO 2.5 MG/0.5ML Pen Inject 2.5 mg into the skin once a week. 09/16/24  Yes [provider]  Multiple Vitamin (MULTIVITAMIN WITH MINERALS) TABS tablet Take 1 tablet by mouth every evening.   Yes [provider]  NOVOLOG  FLEXPEN 100 UNIT/ML FlexPen Inject 8-16 Units into the skin 2 (two) times daily before a meal. Per sliding scale 10/14/23  Yes [provider]  omeprazole (PRILOSEC) 20 MG capsule Take 20 mg by mouth daily before breakfast.   Yes [provider]  rosuvastatin  (CRESTOR ) 10 MG tablet Take 10 mg by mouth at bedtime.   Yes [provider]  Insulin  Pen Needle (ASSURE ID DUO PRO PEN NEEDLES) 31G X 5 MM MISC 1 each by Does not apply route daily. 10/27/23   Cleatrice Ludie Azucena, MD   Social History   Socioeconomic History   Marital status: Divorced    Spouse name: Not on file   Number of children: 0   Years of education: Not on file   Highest education level: Not on file  Occupational History   Occupation: deliver truck parts  Tobacco Use   Smoking status: Former    Current packs/day: 0.00    Average packs/day: 1.5 packs/day for 30.0 years (45.0 ttl pk-yrs)    Types: Cigarettes, Cigars    Start date: 10    Quit date: 2009    Years since quitting: 16.7   Smokeless tobacco: Never  Vaping Use   Vaping status: Never Used  Substance and Sexual Activity  Alcohol  use: Yes    Alcohol /week: 3.0 standard drinks of alcohol     Types: 3 Cans of beer per week    Comment: occas   Drug use: Never   Sexual activity: Not on file  Other Topics Concern   Not on file  Social History Narrative   Not on file   Social Drivers of Health   Financial Resource Strain: Not on file  Food Insecurity: No Food Insecurity (02/06/2023)   Hunger Vital Sign    Worried About Running Out of Food in the Last Year: Never true    Ran Out of Food in the Last Year: Never true  Transportation Needs: No Transportation Needs (02/06/2023)   PRAPARE - Administrator, Civil Service (Medical): No    Lack of Transportation (Non-Medical): No  Physical Activity: Not on file  Stress: Not on file  Social Connections: Not on file   Family History  Problem Relation Age  of Onset   Heart failure Father     ROS: Currently denies lightheadedness, dizziness, Fever, chills, CP, SOB.   No personal history of DVT, PE, MI, or CVA. No loose teeth or dentures All other systems have been reviewed and were otherwise currently negative with the exception of those mentioned in the HPI and as above.  BMI: Estimated body mass index is 26.04 kg/m as calculated from the following:   Height as of 09/27/24: 6' (1.829 m).   Weight as of 09/27/24: 87.1 kg.  Lab Results  Component Value Date   ALBUMIN 4.1 08/23/2023   Diabetes:   Patient has a diagnosis of diabetes,  Lab Results  Component Value Date   HGBA1C 6.9 (H) 09/27/2024   Smoking Status:       Objective: Vitals: Height:  5 feet 10 inches.  Weight:  205 pounds.  BP:  137/77.  Pulse:  90.  Temp:  99.  O2:  92% on room air. Physical Exam: General: Alert, NAD.   HEENT: EOMI, Good Neck Extension  Pulm: No increased work of breathing.  Clear B/L A/P w/o crackle or wheeze.  CV: RRR, No m/g/r appreciated  GI: soft, NT, ND Neuro: Neuro without gross focal deficit.  Sensation intact distally Skin: No lesions in the area of chief complaint MSK/Surgical Site: left shoulder pain with range of motion. He has 0 to 90 degrees of forward flexion at the left shoulder, internal rotation to the mid left buttock.  All fingers flex, extend and abduct.  2+ radial pulse.  Distal sensation intact.  Sensation intact in the axillary nerve distribution.  Imaging Review MRI performed on 04/30/2024 shows moderate cuff pathology as well as arthrosis.  Assessment: Primary osteoarthritis of left shoulder with some rotator cuff pathology   Plan: Plan for Procedure(s): ARTHROPLASTY, SHOULDER, TOTAL, REVERSE  The patient history, physical exam, clinical judgement of the provider and imaging are consistent with end stage degenerative joint disease and reverse total joint arthroplasty is deemed medically necessary. The treatment  options including medical management, injection therapy, and arthroplasty were discussed at length. The risks and benefits of Procedure(s): ARTHROPLASTY, SHOULDER, TOTAL, REVERSE were presented and reviewed.  The risks of nonoperative treatment, versus surgical intervention including but not limited to continued pain, aseptic loosening, stiffness, dislocation/subluxation, infection, bleeding, nerve injury, blood clots, cardiopulmonary complications, morbidity, mortality, among others were discussed. The patient verbalizes understanding and wishes to proceed with the plan.  Patient is being admitted for surgery, OT, pain control, prophylactic antibiotics, VTE prophylaxis, progressive ambulation, ADL's  and discharge planning.   The patient does meet the criteria for TXA which will be used perioperatively.   The patient is planning to be discharged home care of family.   Army MARLA Daring, PA-C 10/04/2024 12:22 PM

## 2024-10-05 ENCOUNTER — Ambulatory Visit (HOSPITAL_COMMUNITY): Payer: Self-pay | Admitting: Medical

## 2024-10-05 ENCOUNTER — Ambulatory Visit (HOSPITAL_COMMUNITY)

## 2024-10-05 ENCOUNTER — Ambulatory Visit (HOSPITAL_COMMUNITY): Admitting: Anesthesiology

## 2024-10-05 ENCOUNTER — Encounter (HOSPITAL_COMMUNITY): Payer: Self-pay | Admitting: Orthopedic Surgery

## 2024-10-05 ENCOUNTER — Ambulatory Visit (HOSPITAL_COMMUNITY)
Admission: RE | Admit: 2024-10-05 | Discharge: 2024-10-05 | Disposition: A | Discharge status: Home / Self Care | Source: Ambulatory Visit | Attending: Orthopedic Surgery | Admitting: Orthopedic Surgery

## 2024-10-05 ENCOUNTER — Encounter (HOSPITAL_COMMUNITY)
Admission: RE | Disposition: A | Discharge status: Home / Self Care | Payer: Self-pay | Source: Ambulatory Visit | Attending: Orthopedic Surgery

## 2024-10-05 DIAGNOSIS — E1151 Type 2 diabetes mellitus with diabetic peripheral angiopathy without gangrene: Secondary | ICD-10-CM | POA: Insufficient documentation

## 2024-10-05 DIAGNOSIS — K219 Gastro-esophageal reflux disease without esophagitis: Secondary | ICD-10-CM | POA: Diagnosis not present

## 2024-10-05 DIAGNOSIS — Z01818 Encounter for other preprocedural examination: Secondary | ICD-10-CM

## 2024-10-05 DIAGNOSIS — I10 Essential (primary) hypertension: Secondary | ICD-10-CM | POA: Diagnosis not present

## 2024-10-05 DIAGNOSIS — M19012 Primary osteoarthritis, left shoulder: Secondary | ICD-10-CM

## 2024-10-05 DIAGNOSIS — Z794 Long term (current) use of insulin: Secondary | ICD-10-CM | POA: Insufficient documentation

## 2024-10-05 DIAGNOSIS — E119 Type 2 diabetes mellitus without complications: Secondary | ICD-10-CM | POA: Diagnosis not present

## 2024-10-05 DIAGNOSIS — Z87891 Personal history of nicotine dependence: Secondary | ICD-10-CM | POA: Diagnosis not present

## 2024-10-05 DIAGNOSIS — M75102 Unspecified rotator cuff tear or rupture of left shoulder, not specified as traumatic: Secondary | ICD-10-CM | POA: Insufficient documentation

## 2024-10-05 DIAGNOSIS — X58XXXA Exposure to other specified factors, initial encounter: Secondary | ICD-10-CM | POA: Diagnosis not present

## 2024-10-05 HISTORY — PX: REVERSE SHOULDER ARTHROPLASTY: SHX5054

## 2024-10-05 LAB — GLUCOSE, CAPILLARY
Glucose-Capillary: 219 mg/dL — ABNORMAL HIGH (ref 70–99)
Glucose-Capillary: 177 mg/dL — ABNORMAL HIGH (ref 70–99)
Glucose-Capillary: 161 mg/dL — ABNORMAL HIGH (ref 70–99)

## 2024-10-05 SURGERY — ARTHROPLASTY, SHOULDER, TOTAL, REVERSE
Anesthesia: General | Site: Shoulder | Laterality: Left

## 2024-10-05 MED ORDER — FENTANYL CITRATE (PF) 100 MCG/2ML IJ SOLN
INTRAMUSCULAR | Status: AC
  Filled 2024-10-05: qty 2

## 2024-10-05 MED ORDER — CEFAZOLIN SODIUM-DEXTROSE 2-4 GM/100ML-% IV SOLN
2.0000 g | INTRAVENOUS | Status: AC
  Administered 2024-10-05: 2 g via INTRAVENOUS
  Filled 2024-10-05: qty 100

## 2024-10-05 MED ORDER — EPHEDRINE SULFATE (PRESSORS) 50 MG/ML IJ SOLN
INTRAMUSCULAR | Status: DC | PRN
  Administered 2024-10-05 (×4): 5 mg via INTRAVENOUS

## 2024-10-05 MED ORDER — SENNA-DOCUSATE SODIUM 8.6-50 MG PO TABS: 2.0000 | ORAL_TABLET | Freq: Every day | ORAL | 1 refills | Status: AC

## 2024-10-05 MED ORDER — FENTANYL CITRATE (PF) 50 MCG/ML IJ SOSY: 25.0000 ug | PREFILLED_SYRINGE | INTRAMUSCULAR | Status: DC | PRN

## 2024-10-05 MED ORDER — STERILE WATER FOR IRRIGATION IR SOLN
Status: DC | PRN
  Administered 2024-10-05: 2000 mL

## 2024-10-05 MED ORDER — LIDOCAINE HCL (CARDIAC) PF 100 MG/5ML IV SOSY
PREFILLED_SYRINGE | INTRAVENOUS | Status: DC | PRN
  Administered 2024-10-05: 60 mg via INTRAVENOUS

## 2024-10-05 MED ORDER — DEXAMETHASONE SOD PHOSPHATE PF 10 MG/ML IJ SOLN
INTRAMUSCULAR | Status: DC | PRN
  Administered 2024-10-05: 4 mg via INTRAVENOUS

## 2024-10-05 MED ORDER — CHLORHEXIDINE GLUCONATE 4 % EX SOLN: 1.0000 | CUTANEOUS | 1 refills | Status: AC

## 2024-10-05 MED ORDER — LACTATED RINGERS IV SOLN: INTRAVENOUS | Status: DC

## 2024-10-05 MED ORDER — FENTANYL CITRATE (PF) 100 MCG/2ML IJ SOLN
INTRAMUSCULAR | Status: DC | PRN
  Administered 2024-10-05: 25 ug via INTRAVENOUS
  Administered 2024-10-05 (×2): 50 ug via INTRAVENOUS
  Administered 2024-10-05 (×2): 25 ug via INTRAVENOUS

## 2024-10-05 MED ORDER — FENTANYL CITRATE (PF) 50 MCG/ML IJ SOSY
PREFILLED_SYRINGE | INTRAMUSCULAR | Status: AC
  Filled 2024-10-05: qty 2

## 2024-10-05 MED ORDER — SODIUM CHLORIDE 0.9 % IR SOLN
Status: DC | PRN
  Administered 2024-10-05: 1000 mL

## 2024-10-05 MED ORDER — BUPIVACAINE HCL (PF) 0.5 % IJ SOLN
INTRAMUSCULAR | Status: DC | PRN
  Administered 2024-10-05: 15 mL via PERINEURAL

## 2024-10-05 MED ORDER — MIDAZOLAM HCL 2 MG/2ML IJ SOLN
INTRAMUSCULAR | Status: AC
  Administered 2024-10-05: 2 mg via INTRAVENOUS
  Filled 2024-10-05: qty 2

## 2024-10-05 MED ORDER — TRANEXAMIC ACID-NACL 1000-0.7 MG/100ML-% IV SOLN
1000.0000 mg | INTRAVENOUS | Status: AC
  Administered 2024-10-05: 1000 mg via INTRAVENOUS
  Filled 2024-10-05: qty 100

## 2024-10-05 MED ORDER — ROCURONIUM BROMIDE 10 MG/ML (PF) SYRINGE
PREFILLED_SYRINGE | INTRAVENOUS | Status: AC
Start: 2024-10-05 — End: 2024-10-05
  Filled 2024-10-05: qty 10

## 2024-10-05 MED ORDER — ORAL CARE MOUTH RINSE
15.0000 mL | Freq: Once | OROMUCOSAL | Status: AC
Start: 2024-10-05 — End: 2024-10-05

## 2024-10-05 MED ORDER — INSULIN ASPART 100 UNIT/ML IJ SOLN
0.0000 [IU] | INTRAMUSCULAR | Status: DC | PRN
  Administered 2024-10-05: 4 [IU] via SUBCUTANEOUS

## 2024-10-05 MED ORDER — MUPIROCIN 2 % EX OINT: 1.0000 | TOPICAL_OINTMENT | Freq: Two times a day (BID) | CUTANEOUS | 0 refills | Status: AC

## 2024-10-05 MED ORDER — OXYCODONE HCL 5 MG/5ML PO SOLN: 5.0000 mg | Freq: Once | ORAL | Status: DC | PRN

## 2024-10-05 MED ORDER — SUGAMMADEX SODIUM 200 MG/2ML IV SOLN
INTRAVENOUS | Status: AC
  Filled 2024-10-05: qty 2

## 2024-10-05 MED ORDER — ONDANSETRON HCL 4 MG/2ML IJ SOLN
INTRAMUSCULAR | Status: AC
  Filled 2024-10-05: qty 2

## 2024-10-05 MED ORDER — BUPIVACAINE LIPOSOME 1.3 % IJ SUSP
INTRAMUSCULAR | Status: DC | PRN
  Administered 2024-10-05: 10 mL via PERINEURAL

## 2024-10-05 MED ORDER — LACTATED RINGERS IV BOLUS
250.0000 mL | Freq: Once | INTRAVENOUS | Status: AC
  Administered 2024-10-05: 250 mL via INTRAVENOUS

## 2024-10-05 MED ORDER — PHENYLEPHRINE HCL-NACL 20-0.9 MG/250ML-% IV SOLN
INTRAVENOUS | Status: DC | PRN
Start: 2024-10-05 — End: 2024-10-05
  Administered 2024-10-05: 50 ug/min via INTRAVENOUS

## 2024-10-05 MED ORDER — DROPERIDOL 2.5 MG/ML IJ SOLN: 0.6250 mg | Freq: Once | INTRAMUSCULAR | Status: DC | PRN

## 2024-10-05 MED ORDER — ONDANSETRON HCL 4 MG PO TABS: 4.0000 mg | ORAL_TABLET | Freq: Three times a day (TID) | ORAL | 0 refills | Status: AC | PRN

## 2024-10-05 MED ORDER — OXYCODONE HCL 5 MG PO TABS: 5.0000 mg | ORAL_TABLET | ORAL | 0 refills | Status: AC | PRN

## 2024-10-05 MED ORDER — LACTATED RINGERS IV BOLUS
500.0000 mL | Freq: Once | INTRAVENOUS | Status: AC
  Administered 2024-10-05: 500 mL via INTRAVENOUS

## 2024-10-05 MED ORDER — SUGAMMADEX SODIUM 200 MG/2ML IV SOLN
INTRAVENOUS | Status: DC | PRN
  Administered 2024-10-05: 200 mg via INTRAVENOUS
  Administered 2024-10-05: 50 mg via INTRAVENOUS

## 2024-10-05 MED ORDER — DEXMEDETOMIDINE HCL IN NACL 200 MCG/50ML IV SOLN
INTRAVENOUS | Status: DC | PRN
  Administered 2024-10-05: 8 ug via INTRAVENOUS

## 2024-10-05 MED ORDER — ONDANSETRON HCL 4 MG/2ML IJ SOLN
INTRAMUSCULAR | Status: DC | PRN
  Administered 2024-10-05: 4 mg via INTRAVENOUS

## 2024-10-05 MED ORDER — ACETAMINOPHEN 500 MG PO TABS
1000.0000 mg | ORAL_TABLET | Freq: Once | ORAL | Status: AC
  Administered 2024-10-05: 1000 mg via ORAL
  Filled 2024-10-05: qty 2

## 2024-10-05 MED ORDER — ROCURONIUM BROMIDE 100 MG/10ML IV SOLN
INTRAVENOUS | Status: DC | PRN
  Administered 2024-10-05: 10 mg via INTRAVENOUS
  Administered 2024-10-05: 70 mg via INTRAVENOUS
  Administered 2024-10-05: 20 mg via INTRAVENOUS
  Administered 2024-10-05: 10 mg via INTRAVENOUS
  Administered 2024-10-05: 30 mg via INTRAVENOUS
  Administered 2024-10-05: 20 mg via INTRAVENOUS

## 2024-10-05 MED ORDER — ROCURONIUM BROMIDE 10 MG/ML (PF) SYRINGE
PREFILLED_SYRINGE | INTRAVENOUS | Status: AC
  Filled 2024-10-05: qty 10

## 2024-10-05 MED ORDER — MIDAZOLAM HCL 2 MG/2ML IJ SOLN: 1.0000 mg | INTRAMUSCULAR | Status: DC

## 2024-10-05 MED ORDER — OXYCODONE HCL 5 MG PO TABS: 5.0000 mg | ORAL_TABLET | Freq: Once | ORAL | Status: DC | PRN

## 2024-10-05 MED ORDER — INSULIN ASPART 100 UNIT/ML IJ SOLN
INTRAMUSCULAR | Status: AC
  Filled 2024-10-05: qty 1

## 2024-10-05 MED ORDER — PROPOFOL 10 MG/ML IV BOLUS
INTRAVENOUS | Status: DC | PRN
  Administered 2024-10-05 (×2): 10 mg via INTRAVENOUS
  Administered 2024-10-05: 180 mg via INTRAVENOUS
  Administered 2024-10-05: 20 mg via INTRAVENOUS
  Administered 2024-10-05: 40 mg via INTRAVENOUS

## 2024-10-05 MED ORDER — 0.9 % SODIUM CHLORIDE (POUR BTL) OPTIME
TOPICAL | Status: DC | PRN
  Administered 2024-10-05: 1000 mL

## 2024-10-05 MED ORDER — LIDOCAINE HCL (PF) 2 % IJ SOLN
INTRAMUSCULAR | Status: AC
  Filled 2024-10-05: qty 5

## 2024-10-05 MED ORDER — POVIDONE-IODINE 10 % EX SWAB: 2.0000 | Freq: Once | CUTANEOUS | Status: DC

## 2024-10-05 MED ORDER — BACLOFEN 10 MG PO TABS: 10.0000 mg | ORAL_TABLET | Freq: Three times a day (TID) | ORAL | 0 refills | Status: AC

## 2024-10-05 MED ORDER — CHLORHEXIDINE GLUCONATE 0.12 % MT SOLN
15.0000 mL | Freq: Once | OROMUCOSAL | Status: AC
  Administered 2024-10-05: 15 mL via OROMUCOSAL

## 2024-10-05 SURGICAL SUPPLY — 54 items
BAG COUNTER SPONGE SURGICOUNT (BAG) IMPLANT
BAG ZIPLOCK 12X15 (MISCELLANEOUS) ×2 IMPLANT
BASEPLATE GLENOSPHERE 25 (Plate) IMPLANT
BEARING HUMERAL SHLDER 36M STD (Shoulder) IMPLANT
BIT DRILL TWIST 2.7 (BIT) IMPLANT
BLADE SAW SGTL 73X25 THK (BLADE) ×2 IMPLANT
CLSR STERI-STRIP ANTIMIC 1/2X4 (GAUZE/BANDAGES/DRESSINGS) ×2 IMPLANT
COOLER ICEMAN CLASSIC (MISCELLANEOUS) ×2 IMPLANT
COVER BACK TABLE 60X90IN (DRAPES) ×2 IMPLANT
COVER SURGICAL LIGHT HANDLE (MISCELLANEOUS) ×2 IMPLANT
DRAPE POUCH INSTRU U-SHP 10X18 (DRAPES) ×2 IMPLANT
DRAPE SHEET LG 3/4 BI-LAMINATE (DRAPES) ×4 IMPLANT
DRAPE SURG 17X11 SM STRL (DRAPES) ×2 IMPLANT
DRAPE SURG 17X23 STRL (DRAPES) ×2 IMPLANT
DRAPE SURG ORHT 6 SPLT 77X108 (DRAPES) ×4 IMPLANT
DRAPE TOP 10253 STERILE (DRAPES) ×2 IMPLANT
DRAPE U-SHAPE 47X51 STRL (DRAPES) ×2 IMPLANT
DRSG MEPILEX POST OP 4X8 (GAUZE/BANDAGES/DRESSINGS) ×2 IMPLANT
DURAPREP 26ML APPLICATOR (WOUND CARE) ×4 IMPLANT
ELECT BLADE TIP CTD 4 INCH (ELECTRODE) ×2 IMPLANT
ELECT REM PT RETURN 15FT ADLT (MISCELLANEOUS) ×2 IMPLANT
FACESHIELD WRAPAROUND OR TEAM (MASK) ×2 IMPLANT
GLENOID SPHERE STD STRL 36MM (Orthopedic Implant) IMPLANT
GLOVE BIO SURGEON STRL SZ 6.5 (GLOVE) ×2 IMPLANT
GLOVE BIOGEL PI IND STRL 7.0 (GLOVE) ×2 IMPLANT
GLOVE BIOGEL PI IND STRL 8 (GLOVE) ×2 IMPLANT
GLOVE ORTHO TXT STRL SZ7.5 (GLOVE) ×2 IMPLANT
GOWN STRL SURGICAL XL XLNG (GOWN DISPOSABLE) ×4 IMPLANT
HOOD PEEL AWAY T7 (MISCELLANEOUS) ×4 IMPLANT
KIT BASIN OR (CUSTOM PROCEDURE TRAY) ×2 IMPLANT
KIT TURNOVER KIT A (KITS) ×2 IMPLANT
PACK SHOULDER (CUSTOM PROCEDURE TRAY) ×2 IMPLANT
PAD COLD SHLDR WRAP-ON (PAD) ×2 IMPLANT
PIN STEINMANN THREADED TIP (PIN) IMPLANT
PIN THREADED REVERSE (PIN) IMPLANT
RESTRAINT HEAD UNIVERSAL NS (MISCELLANEOUS) ×2 IMPLANT
SCREW BONE LOCKING 4.75X30X3.5 (Screw) IMPLANT
SCREW BONE STRL 6.5MMX25MM (Screw) IMPLANT
SCREW LOCKING 4.75MMX15MM (Screw) IMPLANT
SCREW LOCKING STRL 4.75X25X3.5 (Screw) IMPLANT
SLING ARM IMMOBILIZER LRG (SOFTGOODS) ×2 IMPLANT
SPONGE T-LAP 4X18 ~~LOC~~+RFID (SPONGE) IMPLANT
STEM SHOULDER 16MMX55MM LONG (Stem) IMPLANT
SUCTION TUBE FRAZIER 12FR DISP (SUCTIONS) ×2 IMPLANT
SUPPORT WRAP ARM LG (MISCELLANEOUS) ×2 IMPLANT
SUT MAXBRAID #2 CVD NDL (SUTURE) IMPLANT
SUT MAXBRAID #5 CCS-NDL 2PK (SUTURE) IMPLANT
SUT MNCRL AB 3-0 PS2 18 (SUTURE) ×2 IMPLANT
SUT VIC AB 1 CT1 36 (SUTURE) ×2 IMPLANT
SUT VIC AB 2-0 CT1 TAPERPNT 27 (SUTURE) ×2 IMPLANT
TOWEL OR 17X26 10 PK STRL BLUE (TOWEL DISPOSABLE) ×2 IMPLANT
TOWER SMARTMIX MINI (MISCELLANEOUS) IMPLANT
TRAY HUM MINI SHOULDER +3 40 (Joint) IMPLANT
TUBE SUCTION HIGH CAP CLEAR NV (SUCTIONS) ×2 IMPLANT

## 2024-10-05 NOTE — Anesthesia Procedure Notes (Addendum)
 Anesthesia Regional Block: Interscalene brachial plexus block   Pre-Anesthetic Checklist: , timeout performed,  Correct Patient, Correct Site, Correct Laterality,  Correct Procedure, Correct Position, site marked,  Risks and benefits discussed,  Surgical consent,  Pre-op evaluation,  At surgeon's request and post-op pain management  Laterality: Left  Prep: chloraprep       Needles:  Injection technique: Single-shot  Needle Type: Echogenic Needle     Needle Length: 4cm  Needle Gauge: 25     Additional Needles:   Procedures:,,,, ultrasound used (permanent image in chart),,    Narrative:  Start time: 10/05/2024 10:45 AM End time: 10/05/2024 10:51 AM Injection made incrementally with aspirations every 5 mL.  Performed by: Personally  Anesthesiologist: Boone Fess, MD  Additional Notes: Patient's chart reviewed and they were deemed appropriate candidate for procedure, at surgeon's request. Patient educated about risks, benefits, and alternatives of the block including but not limited to: temporary or permanent nerve damage, bleeding, infection, damage to surround tissues, pneumothorax, hemidiaphragmatic paralysis, unilateral Horner's syndrome, block failure, local anesthetic toxicity. Patient expressed understanding. A formal time-out was conducted consistent with institution rules.  Monitors were applied, and minimal sedation used (see nursing record). The site was prepped with skin prep and allowed to dry, and sterile gloves were used. A high frequency linear ultrasound probe with probe cover was utilized throughout. C5-7 nerve roots located and appeared anatomically normal, local anesthetic injected around them, and echogenic block needle trajectory was monitored throughout. Aspiration performed every 5ml. Lung and blood vessels were avoided. All injections were performed without resistance and free of blood and paresthesias. The patient tolerated the procedure well.  Injectate:  10ml exparel  + 15ml 0.5% bupivacaine 

## 2024-10-05 NOTE — Discharge Instructions (Signed)
 Shoulder Replacement Post-Operative Instructions  DIET:  Return to eating and drinking as you normally would. You will need to add some extra fluids (water) to prevent constipation.   WOUND CARE: The post-op dressing is not water proof, please cover your shoulder with Press n' Seal saran wrap or a plastic bag when showering If it fills with liquid or blood please call us  immediately to change it for you. There may be a small amount of fluid/bleeding leaking at the surgical site. This is normal after surgery.  Use your Ice machine or Ice as often as needed for pain relief. Always keep a towel, ACE wrap or other barrier between the cooling unit and your skin.  Do not soak the incision in water or submerge it.  Keep dry incisions as dry as possible.  REGIONAL ANESTHESIA (NERVE BLOCKS) The anesthesia team may have performed a nerve block for you if safe in the setting of your care.  This is a great tool used to minimize pain.  If you had a block, the short acting medicine will wear off between 8-24 hrs postop typically but the long acting usually will start working around 20-24 hrs postop.  There may be a period where you have more pain while waiting for the long-acting medicine to take effect.  Please use an extra dose of pain medication if needed at this point. We suggest you use the pain medication the first night prior to going to bed, in order to ease any pain when the anesthesia wears off. You should avoid taking pain medications on an empty stomach as it will make you nauseous.   MEDICATIONS:   Listed below are some of the medications that you will be given after surgery.  If you have questions about any of them please ask and we will be glad to go over them with you.  Prescriptions unless otherwise discussed are electronically sent to your pharmacy.   Oxycodone or Hydrocodone  - These are strong narcotics, to be used only on an "as needed" basis for pain.  If you think that you will need a  refill of this medicine, please plan ahead.  These will not be filled at night or over the weekend. Please note, we can only give 1 week at a time because of [place] law.  Stop this medicine as soon as you are able. Zofran  - take as needed for nausea or vomiting Acetaminophen (Tylenol ):  non-narcotic medicine for pain. Take to wean off the narcotics. You can pick this up over the counter at your pharmacy. If you were given hydrocodone  after surgery, hydrocodone  also contains Tylenol . You may take Tylenol  on top of your hydrocodone  or oxycodone but your total Tylenol  dose should not exceed 4000mg  in 24 hours.  Senokot-S - stool softener to help with constipation caused by narcotic pain medication  You should wean off your narcotic medicines as soon as you are able.  Most patients will be off or using minimal narcotics before their first postop appointment. Do not drink alcoholic beverages or take illicit drugs when taking pain medications. It is against the law to drive while taking narcotics.    Constipation:  Is common after surgery.  To prevent this, increase your fluid intake and dietary fiber. Reduce or stop using narcotic medicine.  You may also use prune juice, Colace, or Miralax 17 grams (one cap full) in 8 ounces of something to drink 1-3 times a day until bowel movements are regular.  You getting up and  moving will also help with constipation. Pain medication may make you constipated.  Below are a few solutions to try in this order: Decrease the amount of pain medication if you aren't having pain. Drink lots of decaffeinated fluids. Drink prune juice and/or each dried prunes  Precautions: If you experience any type of sudden onset of chest pain or sudden shortness of breath call 911!! Immediately.   For other issues - Fever over 101, increased swelling on surgical side, increased pain, wound drainage; please contact  the office [phone].   We would prefer to care for you in our office  rather than a trip to the ER.   Brace/Activity: A sling has been provided for you. You may remove the sling 3 times a day to move your elbow. Otherwise, you should be wearing your sling at all times.  You may remove the sling for showering, Keep your arm by your side during your shower. You may lift it minimally to wash under it and dry completely.  You may be more comfortable sleeping in a semi-seated position the first few nights following surgery.  Keep a pillow propped under the elbow and forearm for comfort.  If you have a recliner type of chair it might be beneficial.  If not, that is fine too, but it would be helpful to sleep propped up with pillows behind your operated shoulder as well under your elbow and forearm. You may return to work/school in the next couple of days when you feel up to it. Desk work and typing in the sling is fine. When dressing, put your operative arm in the sleeve first.  When getting undressed, take your operative arm out last.  Loose fitting, button-down shirts are recommended.  Often in the first days after surgery you may be more comfortable keeping your operative arm under your shirt and not through the sleeve. Do not lift anything heavier than 1 pound until we discuss it further in clinic.  Post op follow up:  Your first follow-up visit after surgery will be made prior to your surgery. This will be scheduled for approximately 2 weeks after surgery.  Your physical therapy schedule will be discussed with your surgeon.  Thank you for choosing our office to care for you!

## 2024-10-05 NOTE — Interval H&P Note (Signed)
 History and Physical Interval Note:  10/05/2024 9:34 AM  Shawn Daniels  has presented today for surgery, with the diagnosis of Primary osteoarthritis of left shoulder.  The various methods of treatment have been discussed with the patient and family. After consideration of risks, benefits and other options for treatment, the patient has consented to  Procedure(s): ARTHROPLASTY, SHOULDER, TOTAL, REVERSE (Left) as a surgical intervention.  The patient's history has been reviewed, patient examined, no change in status, stable for surgery.  I have reviewed the patient's chart and labs.  Questions were answered to the patient's satisfaction.     [name]

## 2024-10-05 NOTE — Anesthesia Procedure Notes (Signed)
 Procedure Name: Intubation Date/Time: 10/05/2024 11:38 AM  Performed by: Kathern Rollene LABOR, CRNAPre-anesthesia Checklist: Patient identified, Emergency Drugs available, Suction available and Patient being monitored Patient Re-evaluated:Patient Re-evaluated prior to induction Oxygen Delivery Method: Circle system utilized Preoxygenation: Pre-oxygenation with 100% oxygen Induction Type: IV induction Ventilation: Mask ventilation without difficulty Laryngoscope Size: Mac and 4 Grade View: Grade I Tube type: Oral Tube size: 7.5 mm Number of attempts: 1 Airway Equipment and Method: Stylet Placement Confirmation: ETT inserted through vocal cords under direct vision, positive ETCO2 and breath sounds checked- equal and bilateral Secured at: 23 cm Tube secured with: Tape Dental Injury: Teeth and Oropharynx as per pre-operative assessment

## 2024-10-05 NOTE — Op Note (Signed)
 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  ANESTHESIA:   General with interscalene block using Exparel   ESTIMATED BLOOD LOSS: 200 mL  UNIQUE ASPECTS OF THE CASE: It was difficult to find the cephalic vein, I was ultimately happy with the deltopectoral interval, but the cephalic vein was not really present.  His bone quality was exceedingly poor.  I reamed up to a size 17 fairly easily.  The subscapularis and supraspinatus were present.  I had slight metaphyseal blush inferiorly on the baseplate, and complete seating of the baseplate.  I did remove inferior bone to minimize impingement and ensure that the glenosphere fully seated.  I left the superior locking screw unicortical.  I did repair the subscapularis back to bone, as well as the rotator interval, the 16mm stem seated fully and felt satisfactory in its fixation.  The shoulder reduced fairly easily, however preoperatively he was exceedingly tight.  He was much tighter than I would have anticipated based on his pathology on MRI and x-rays.  I debated making his construct tighter, but I felt like leaving him slightly loose was of benefit given his preoperative stiffness, and given a complete repair of the rotator cuff I felt hopeful that the shoulder will remain stable.  PREOPERATIVE INDICATIONS:  Shawn Daniels is a  68 y.o. 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    The risks benefits and alternatives  were discussed with the patient preoperatively including but not limited to the risks of infection, bleeding, nerve injury, cardiopulmonary complications, the need for revision surgery, dislocation, brachial plexus palsy, incomplete relief of pain, among others, and the patient was willing to proceed.  OPERATIVE IMPLANTS:   Implant Name: BASEPLATE GLENOSPHERE 25 - [id number] Type: Plate Inv. Item: BASEPLATE GLENOSPHERE 25 Serial No.:  Manufacturer: [employer](ORTH,TRAU,BIO,SG) Lot No.: [id number] LRB: Left No. Used: 1 Action: Implanted   Implant Name: SCREW BONE STRL 6.4FFK74FF - [id number] Type: Screw Inv. Item: SCREW BONE STRL 6.4FFK74FF Serial No.:  Manufacturer: [employer](ORTH,TRAU,BIO,SG) Lot No.: [id number] LRB: Left No. Used: 1 Action: Implanted   Implant Name: AMPARO MAYOLA DOLPHIN 5.24K74K6.5 - [id number] Type: Screw Inv. Item: SCREW LOCKING STRL [id number] Serial No.:  Manufacturer: [employer](ORTH,TRAU,BIO,SG) Lot No.: [id number] LRB: Left No. Used: 1 Action: Implanted   Implant Name: DINO BAL STD STRL - [id number] Type: Orthopedic Implant Inv. Item: GLENOID SPHERE STD STRL Serial No.:  Manufacturer: [employer](ORTH,TRAU,BIO,SG) Lot No.: [id number] LRB: Left No. Used: 1 Action: Implanted   Implant Name: SCREW BONE LOCKING 5.24K69K6.5 - [id number] Type: Screw Inv. Item: SCREW BONE LOCKING [id number] Serial No.:  Manufacturer: [employer](ORTH,TRAU,BIO,SG) Lot No.: [id number] LRB: Left No. Used: 1 Action: Implanted   Implant NameBETHA AMPARO MAYOLA 4.75MMX15MM - [id number] Type: Screw Inv. Item: SCREW LOCKING 4.75MMX15MM Serial No.:  Manufacturer: [employer](ORTH,TRAU,BIO,SG) Lot No.: [id number] LRB: Left No. Used: 1 Action: Implanted   Implant NameBETHA AMPARO MAYOLA 4.75MMX15MM - [id number] Type: Screw Inv. Item: SCREW LOCKING 4.75MMX15MM Serial No.:  Manufacturer: [employer](ORTH,TRAU,BIO,SG) Lot No.: [id number] LRB:  Left No. Used: 1 Action:  Implanted   Implant Name: STEM SHOULDER 16MMX55MM LONG - [id number] Type: Stem Inv. Item: STEM SHOULDER 16MMX55MM LONG Serial No.:  Manufacturer: [employer](ORTH,TRAU,BIO,SG) Lot No.: [id number] LRB: Left No. Used: 1 Action: Implanted   Implant Name: TRAY HUM MINI SHOULDER +3 40 - [id number] Type: Joint Inv. Item: TRAY HUM MINI SHOULDER +3 40 Serial No.:  Manufacturer: [employer](ORTH,TRAU,BIO,SG) Lot No.: [id number] LRB: Left No. Used: 1 Action: Implanted   Implant Name: BEARING HUMERAL SHLDER 4M STD - [id number] Type: Shoulder Inv. Item: BEARING HUMERAL SHLDER 4M STD Serial No.:  Manufacturer: [employer](ORTH,TRAU,BIO,SG) Lot No.: [id number] LRB: Left No. Used: 1 Action: Implanted   OPERATIVE PROCEDURE: The patient was brought to the operating room and placed in the supine position. General anesthesia was administered. IV antibiotics were given.  Time out was performed. The upper extremity was prepped and draped in usual sterile fashion.  He was exceedingly stiff.  The patient was in a beachchair position. Deltopectoral approach was carried out. The biceps was tenodesed to the pectoralis tendon with #2 Maxbraid. The subscapularis was released off of the bone.   I then performed circumferential releases of the humerus, and then dislocated the head, and then reamed with the reamer to the above named size.  I then applied the jig, and cut the humeral head in 30 of retroversion, and then turned my attention to the glenoid.  Deep retractors were placed, and I resected the labrum, and then placed a guidepin into the center position on the glenoid, with slight inferior inclination. I then reamed over the guidepin, and this created a small metaphyseal cancellus blush inferiorly, removing just the cartilage to the subchondral bone superiorly. The base plate was selected and impacted place, and then I secured it centrally with a nonlocking screw, and I  had excellent purchase both inferiorly and superiorly. I placed a short locking screws on anterior and posterior aspects.  I then turned my attention to the glenosphere, and impacted this into place, placing slight inferior offset (set on B).   The glenosphere was completely seated, and had engagement of the [facility] taper. I then turned my attention back to the humerus.  I sequentially broached, and then trialed, and was found to restore soft tissue tension, and it had 2 finger tightness. Therefore the above named components were selected. The shoulder felt stable throughout functional motion.  Before I placed the real prosthesis I had also placed a total of 1 #2 and 2 #5 Maxbraid through the humerus for later subscapularis repair.  I then impacted the real prosthesis into place, as well as the real humeral tray, and reduced the shoulder. The shoulder had excellent motion, and was stable, and I irrigated the wounds copiously.    I then used the Maxbraid suture to repair the subscapularis. This came down to bone.  I then irrigated the shoulder copiously once more, repaired the deltopectoral interval with Vicryl followed by subcutaneous Vicryl with Steri-Strips and sterile gauze for the skin. The patient was awakened and returned back in stable and satisfactory condition. There were no complications and He tolerated the procedure well.

## 2024-10-05 NOTE — Evaluation (Signed)
 Occupational Therapy Evaluation Patient Details Name: Shawn Daniels MRN: [id number] DOB: [birth year] Today's Date: 10/05/2024   History of Present Illness   Shawn Daniels is a 68 y.o. male who presents for L Reverse TSA 10/05/24. PMH: osteoarthritis of left shoulder, GERD, DM, HTN, PVD     Clinical Impressions PTA pt lives alone and was independent prior to surgery this day.  Education completed regarding compensatory strategies for ADL tasks and functional mobility, management of sling, L ROM per specified parameters in the order set as indicated below, positioning of operative arm in sitting and supine and edema control, including use of Iceman Cold Therapy machine. Caregiver present for education, written handouts provided and reviewed using Teach Back and pt/caregiver verbalized/demonstrated understanding. Due to the below listed deficits, pt requires minimal assistance with ADL tasks and Supervision assist with functional mobility on level surfaces. Caregiver will be able to provide necessary level of assistance at discharge. Pt to follow up with MD to progress rehab of the operative shoulder. Sister present for all training with + teach back.       If plan is discharge home, recommend the following:   A little help with walking and/or transfers;A little help with bathing/dressing/bathroom;Assist for transportation;Help with stairs or ramp for entrance     Functional Status Assessment   Patient has had a recent decline in their functional status and demonstrates the ability to make significant improvements in function in a reasonable and predictable amount of time.     Equipment Recommendations   None recommended by OT      Precautions/Restrictions   Precautions Precautions: Shoulder Precaution Booklet Issued: Yes (comment) No shoulder ROM allowed; elbow/wrist/hand AROM only.  Required Braces or Orthoses: Sling Restrictions Weight Bearing Restrictions  Per Provider Order: Yes LUE Weight Bearing Per Provider Order: Non weight bearing     Mobility Bed Mobility Overal bed mobility:  (educated in bed positioning however remained recliner level vs bed)                  Transfers Overall transfer level: Needs assistance   Transfers: Sit to/from Stand, Bed to chair/wheelchair/BSC Sit to Stand: Supervision     Step pivot transfers: Supervision     General transfer comment: amb in [place] with sister Supervision for safety education      Balance Overall balance assessment: No apparent balance deficits (not formally assessed)                                         ADL either performed or assessed with clinical judgement   ADL Overall ADL's : Needs assistance/impaired  Per orders, L shoulder parameters as follows for ADL tasks: No shoulder ROM; elbow/wrist/hand ROM only. While moving within specified parameters, pt/caregiver instructed on bathing and how to donn/doff shirt, placing operative arm through sleeve first when donning and off last when doffing.Pt/caregiver educated on compensatory strategies for LB ADL and strategies to reduce risk of falls.  Pt/caregiver educated on donning/doffing sling and to wear the sling at all times with the exception of ADL, and to loosen the neck strap of the sling when the operative arm is in a supported position when sitting. In sitting or supine, pt instructed to have a pillow behind and under their operative arm to provide support. If assist needed with ambulation, caregiver educated on the importance of walking on pt's non-operative side.  Education regarding use of IceMan Cold Therapy completed, including the importance of using a barrier on the shoulder prior to positioning the wrap-on pad. Pt/caregiver verbalized/demonstrated understanding. Teach Back used while caregiver assisted with dressing pt and positioning wrap-on pad to facilitate DC.                                             Vision Baseline Vision/History: 0 No visual deficits              Pertinent Vitals/Pain Pain Assessment Pain Assessment: No/denies pain     Extremity/Trunk Assessment Upper Extremity Assessment Upper Extremity Assessment: Left hand dominant;LUE deficits/detail LUE Deficits / Details: L operative nerve block remains active however AAROM L elbow, wrist, hand WFL's LUE Coordination: decreased fine motor;decreased gross motor   Lower Extremity Assessment Lower Extremity Assessment: Overall WFL for tasks assessed   Cervical / Trunk Assessment Cervical / Trunk Assessment: Normal   Communication Communication Communication: No apparent difficulties   Cognition Arousal: Alert Behavior During Therapy: WFL for tasks assessed/performed Cognition: No apparent impairments                               Following commands: Intact       Cueing  General Comments   Cueing Techniques: Verbal cues  L shoulder post op L shoulder incision covered and intact, Sats on RA >94%   Exercises Exercises: Shoulder Shoulder Exercises Elbow Flexion: AAROM, Left, 10 reps Elbow Extension: AAROM, Left, 10 reps Wrist Flexion: AAROM, Left, 10 reps Wrist Extension: AAROM, Left, 10 reps Digit Composite Flexion: AAROM, Left, 10 reps Composite Extension: AAROM, Left, 10 reps   Shoulder Instructions Shoulder Instructions Donning/doffing shirt without moving shoulder: Caregiver independent with task;Patient able to independently direct caregiver Method for sponge bathing under operated UE: Caregiver independent with task;Patient able to independently direct caregiver Donning/doffing sling/immobilizer: Caregiver independent with task;Patient able to independently direct caregiver Correct positioning of sling/immobilizer: Caregiver independent with task;Patient able to independently direct caregiver ROM for elbow, wrist and digits of operated UE: Caregiver  independent with task;Patient able to independently direct caregiver Sling wearing schedule (on at all times/off for ADL's): Caregiver independent with task;Patient able to independently direct caregiver Proper positioning of operated UE when showering: Caregiver independent with task;Patient able to independently direct caregiver Positioning of UE while sleeping: Caregiver independent with task;Patient able to independently direct caregiver    Home Living Family/patient expects to be discharged to:: Private residence Living Arrangements: Alone Available Help at Discharge: Family Type of Home: House Home Access: Stairs to enter     Home Layout: One level     Bathroom Shower/Tub: Walk-in shower;Door   [employer] Toilet: Standard Bathroom Accessibility: No   Home Equipment: [employer] - single point          Prior Functioning/Environment Prior Level of Function : Independent/Modified Independent             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 [employer] ADLs Comments: indep    OT Problem List: Impaired UE functional use    AM-PAC OT 6 Clicks Daily Activity     Outcome Measure Help from another person eating meals?: None Help from another person taking care of personal grooming?: None Help from another person toileting, which includes using toliet, bedpan,  or urinal?: A Little Help from another person bathing (including washing, rinsing, drying)?: A Little Help from another person to put on and taking off regular upper body clothing?: A Little Help from another person to put on and taking off regular lower body clothing?: A Little 6 Click Score: 20   End of Session Equipment Utilized During Treatment: Gait belt Nurse Communication: Mobility status;Precautions;Weight bearing status  Activity Tolerance: Patient tolerated treatment well Patient left: in chair;with call bell/phone within reach;with family/visitor present;with  nursing/sitter in room  OT Visit Diagnosis: Other (comment) (L UE dysfunction)                Time: 1445-1540 OT Time Calculation (min): 55 min Charges:  OT General Charges $OT Visit: 1 Visit OT Evaluation $OT Eval Low Complexity: 1 Low OT Treatments $Self Care/Home Management : 8-22 mins $Therapeutic Exercise: 8-22 mins  Heidee Audi OT/L Acute Rehabilitation Department  [phone]  10/05/2024, 5:24 PM

## 2024-10-05 NOTE — Transfer of Care (Signed)
 Immediate Anesthesia Transfer of Care Note  Patient: Shawn Daniels  Procedure(s) Performed: ARTHROPLASTY, SHOULDER, TOTAL, REVERSE (Left: Shoulder)  Patient Location: PACU  Anesthesia Type:General  Level of Consciousness: awake, alert , oriented, and patient cooperative  Airway & Oxygen Therapy: Patient Spontanous Breathing and Patient connected to face mask oxygen  Post-op Assessment: Report given to RN and Post -op Vital signs reviewed and stable  Post vital signs: Reviewed and stable  Last Vitals:  Vitals Value Taken Time  BP 130/63 10/05/24 14:09  Temp    Pulse 89 10/05/24 14:13  Resp 20 10/05/24 14:13  SpO2 97 % 10/05/24 14:13  Vitals shown include unfiled device data.  Last Pain:  Vitals:   10/05/24 0841  TempSrc:   PainSc: 0-No pain         Complications: No notable events documented.

## 2024-10-05 NOTE — Anesthesia Postprocedure Evaluation (Signed)
 Anesthesia Post Note  Patient: Shawn Daniels  Procedure(s) Performed: ARTHROPLASTY, SHOULDER, TOTAL, REVERSE (Left: Shoulder)     Patient location during evaluation: PACU Anesthesia Type: General Level of consciousness: awake and alert Pain management: pain level controlled Vital Signs Assessment: post-procedure vital signs reviewed and stable Respiratory status: spontaneous breathing, nonlabored ventilation, respiratory function stable and patient connected to nasal cannula oxygen Cardiovascular status: blood pressure returned to baseline and stable Postop Assessment: no apparent nausea or vomiting Anesthetic complications: no   No notable events documented.  Last Vitals:  Vitals:   10/05/24 1442 10/05/24 1456  BP:  (!) 126/58  Pulse: 92 94  Resp: 20 16  Temp:  36.6 C  SpO2: (!) 88% 91%    Last Pain:  Vitals:   10/05/24 1500  TempSrc:   PainSc: 0-No pain                 Rome Ade

## 2024-10-06 ENCOUNTER — Encounter (HOSPITAL_COMMUNITY): Payer: Self-pay | Admitting: Orthopedic Surgery

## 2024-11-01 ENCOUNTER — Other Ambulatory Visit: Payer: Self-pay | Admitting: Pharmacist

## 2024-11-01 DIAGNOSIS — E7800 Pure hypercholesterolemia, unspecified: Secondary | ICD-10-CM

## 2024-11-01 DIAGNOSIS — I70213 Atherosclerosis of native arteries of extremities with intermittent claudication, bilateral legs: Secondary | ICD-10-CM

## 2024-11-01 DIAGNOSIS — I739 Peripheral vascular disease, unspecified: Secondary | ICD-10-CM

## 2024-11-01 MED ORDER — REPATHA SURECLICK 140 MG/ML ~~LOC~~ SOAJ: 140.0000 mg | SUBCUTANEOUS | 3 refills | Status: AC

## 2024-12-20 ENCOUNTER — Other Ambulatory Visit: Payer: Self-pay

## 2024-12-20 DIAGNOSIS — I70213 Atherosclerosis of native arteries of extremities with intermittent claudication, bilateral legs: Secondary | ICD-10-CM

## 2024-12-24 ENCOUNTER — Other Ambulatory Visit (HOSPITAL_COMMUNITY): Payer: Self-pay

## 2024-12-24 ENCOUNTER — Telehealth: Payer: Self-pay | Admitting: Internal Medicine

## 2024-12-24 NOTE — Telephone Encounter (Signed)
 Patient called stating he needs his grant for repatha  renewed.  He is also wondering if there are any grants for [facility], he said his PCP has him on this medication.

## 2024-12-24 NOTE — Telephone Encounter (Signed)
 I was able to get the patient added into the [employer] portal, however all grants are being flagged for diagnosis verification. I will submit the form into the portal today, but please allow a few business days for the [facility] team to review and release the funds.

## 2024-12-24 NOTE — Telephone Encounter (Signed)
"  error  "

## 2025-01-06 ENCOUNTER — Other Ambulatory Visit (HOSPITAL_COMMUNITY): Payer: Self-pay

## 2025-01-06 ENCOUNTER — Telehealth: Payer: Self-pay | Admitting: Pharmacy Technician

## 2025-01-06 NOTE — Telephone Encounter (Addendum)
 Patient Advocate Encounter   The patient was approved for a [employer] grant that will help cover the cost of jardiance Total amount awarded, 2500.  Effective: 12/19/24 - 12/18/25   APW:[id number] ERW:[id number] Hmnle:[id number] PI:[id number] [employer] ID: [id number]   Pharmacy provided with approval and processing information. Patient informed via telephone   [facility] filling now for free

## 2025-01-06 NOTE — Telephone Encounter (Signed)
" ° ° ° °  Mounjaro would be 185.26 after deductible "

## 2025-01-06 NOTE — Telephone Encounter (Signed)
 Patient Advocate Encounter   The patient was approved for a [employer] grant that will help cover the cost of repatha  Total amount awarded, 2500.  Effective: 11/24/24 - 11/23/25   APW:[id number] ERW:[id number] Hmnle:[id number] PI:[id number] [employer] ID: [id number]   Pharmacy provided with approval and processing information. Patient informed via telephone

## 2025-01-17 ENCOUNTER — Ambulatory Visit (HOSPITAL_COMMUNITY)

## 2025-01-17 ENCOUNTER — Ambulatory Visit

## 2025-01-20 ENCOUNTER — Telehealth: Payer: Self-pay | Admitting: Pharmacy Technician

## 2025-01-20 NOTE — Telephone Encounter (Signed)
" ° ° ° ° °  The patient called and said [facility] was having trouble processing his repatha  grant. He has two grants. I called [facility] and provided the [employer] information. Repatha  is now free.   I called the patient back to make him aware  "

## 2025-01-21 ENCOUNTER — Ambulatory Visit (HOSPITAL_COMMUNITY)

## 2025-01-24 ENCOUNTER — Ambulatory Visit (HOSPITAL_COMMUNITY)

## 2025-01-24 ENCOUNTER — Ambulatory Visit

## 2025-01-26 ENCOUNTER — Ambulatory Visit (HOSPITAL_COMMUNITY): Admission: RE | Admit: 2025-01-26 | Discharge: 2025-01-26 | Attending: Surgery | Admitting: Surgery

## 2025-01-26 DIAGNOSIS — I70213 Atherosclerosis of native arteries of extremities with intermittent claudication, bilateral legs: Secondary | ICD-10-CM

## 2025-01-26 LAB — VAS US ABI WITH/WO TBI
Left ABI: 0.66
Right ABI: 0.96

## 2025-01-27 NOTE — Progress Notes (Unsigned)
 " HISTORY AND PHYSICAL     CC:  follow up. Requesting Provider:  Rosamond Leta NOVAK, MD  HPI: This is a 69 y.o. male who is here today for follow up for PAD.  Pt has hx of:  -right iliofemoral endarterectomy with bovine patch angioplasty on 04/26/2022 by Dr. Serene for RLE claudication.   -angiogram with orbital atherectomy and drug coated balloon angioplasty right popliteal artery on 08/20/2022 by Dr. Serene for RLE claudication.  -angiogram with shock wave intra arterial lithotripsy and stent left popliteal artery 11/19/2022 Dr. Serene for LLE claudication -angiogram with shockwave intra arterial lithotripsy and drug coated balloon angioplasty left SFA on 02/24/2024  Pt was last seen 07/19/2024 and at that time, he was not having any claudication, rest pain or tissue loss.  He did have velocity of 500 cm/s in the left CFA, however, he had palpable pulses and ABI ahd not changed.  He was scheduled for 6 month follow up.    He had been referred for carotid bruit and had bilateral 1-39% ICA stenosis and was asymptomatic.  He did have to and fro left vertebral with waveform indicative of left SCA stenosis but pt asymptomatic.   The pt returns today for follow up.  ***  The pt is on a statin for cholesterol management.    The pt is on an aspirin .    Other AC:  Plavix  The pt is on BB, ACEI for hypertension.  The pt is  on diabetic medication. Tobacco hx:  former  Pt does *** have family hx of AAA.  Past Medical History:  Diagnosis Date   Arthritis    Diabetes mellitus without complication (HCC)    GERD (gastroesophageal reflux disease)    History of kidney stones    Hypertension    Peripheral vascular disease     Past Surgical History:  Procedure Laterality Date   ABDOMINAL AORTOGRAM W/LOWER EXTREMITY Right 08/20/2022   Procedure: ABDOMINAL AORTOGRAM W/LOWER EXTREMITY;  Surgeon: Serene Gaile ORN, MD;  Location: [facility];  Service: Cardiovascular;  Laterality: Right;   ABDOMINAL  AORTOGRAM W/LOWER EXTREMITY N/A 11/19/2022   Procedure: ABDOMINAL AORTOGRAM W/LOWER EXTREMITY;  Surgeon: Serene Gaile ORN, MD;  Location: [facility];  Service: Cardiovascular;  Laterality: N/A;   ABDOMINAL AORTOGRAM W/LOWER EXTREMITY Bilateral 02/24/2024   Procedure: ABDOMINAL AORTOGRAM W/LOWER EXTREMITY;  Surgeon: Serene Gaile ORN, MD;  Location: [facility];  Service: Cardiovascular;  Laterality: Bilateral;   ENDARTERECTOMY FEMORAL Right 04/26/2022   Procedure: RIGHT FEMORAL ENDARTERECTOMY;  Surgeon: Serene Gaile ORN, MD;  Location: [facility];  Service: Vascular;  Laterality: Right;   IR KYPHO LUMBAR INC FX REDUCE BONE BX UNI/BIL CANNULATION INC/IMAGING  01/13/2023   IR RADIOLOGIST EVAL & MGMT  01/29/2023   KNEE SURGERY     LOWER EXTREMITY ANGIOGRAPHY N/A 01/01/2022   Procedure: LOWER EXTREMITY ANGIOGRAPHY;  Surgeon: Elmira Newman PARAS, MD;  Location: [facility];  Service: Cardiovascular;  Laterality: N/A;   PATCH ANGIOPLASTY Right 04/26/2022   Procedure: PATCH ANGIOPLASTY OF RIGHT FEMORAL ARTERY USING XENOSURE BOVINE PERCARDIUM PATCH;  Surgeon: Serene Gaile ORN, MD;  Location: [facility];  Service: Vascular;  Laterality: Right;   PENILE PROSTHESIS IMPLANT     PERIPHERAL INTRAVASCULAR LITHOTRIPSY Left 11/19/2022   Procedure: INTRAVASCULAR LITHOTRIPSY;  Surgeon: Serene Gaile ORN, MD;  Location: [facility];  Service: Cardiovascular;  Laterality: Left;   PERIPHERAL INTRAVASCULAR LITHOTRIPSY Left 02/24/2024   Procedure: PERIPHERAL INTRAVASCULAR LITHOTRIPSY;  Surgeon: Serene,  Gaile ORN, MD;  Location: [facility];  Service: Cardiovascular;  Laterality: Left;  LT SFA   PERIPHERAL VASCULAR ATHERECTOMY  08/20/2022   Procedure: PERIPHERAL VASCULAR ATHERECTOMY;  Surgeon: Serene Gaile ORN, MD;  Location: [facility];  Service: Cardiovascular;;  popliteal   PERIPHERAL VASCULAR BALLOON ANGIOPLASTY  01/08/2022   Procedure: PERIPHERAL VASCULAR BALLOON ANGIOPLASTY;  Surgeon: Ladona Heinz,  MD;  Location: [facility];  Service: Cardiovascular;;   PERIPHERAL VASCULAR BALLOON ANGIOPLASTY  02/24/2024   Procedure: PERIPHERAL VASCULAR BALLOON ANGIOPLASTY;  Surgeon: Serene Gaile ORN, MD;  Location: [facility];  Service: Cardiovascular;;  Rt SFA   PERIPHERAL VASCULAR INTERVENTION Left 11/19/2022   Procedure: PERIPHERAL VASCULAR INTERVENTION;  Surgeon: Serene Gaile ORN, MD;  Location: [facility];  Service: Cardiovascular;  Laterality: Left;   REVERSE SHOULDER ARTHROPLASTY Left 10/05/2024   Procedure: ARTHROPLASTY, SHOULDER, TOTAL, REVERSE;  Surgeon: Josefina Chew, MD;  Location: [facility];  Service: Orthopedics;  Laterality: Left;   WRIST SURGERY      Allergies[1]  Current Outpatient Medications  Medication Sig Dispense Refill   Abaloparatide  (TYMLOS ) 3120 MCG/1.56ML SOPN Inject 80 mcg into the skin daily. 1.56 mL 12   aspirin  EC 81 MG tablet Take 1 tablet (81 mg total) by mouth at bedtime. 30 tablet 12   baclofen  (LIORESAL ) 10 MG tablet Take 1 tablet (10 mg total) by mouth 3 (three) times daily. As needed for muscle spasm 50 tablet 0   chlorhexidine  (HIBICLENS ) 4 % external liquid Apply 15 mLs (1 Application total) topically as directed for 30 doses. Use as directed daily for 5 days every other week for 6 weeks. 946 mL 1   clopidogrel  (PLAVIX ) 75 MG tablet Take 1 tablet by mouth once daily 90 tablet 2   Evolocumab  (REPATHA  SURECLICK) 140 MG/ML SOAJ Inject 140 mg into the skin every 14 (fourteen) days. 6 mL 3   fenofibrate  (TRICOR ) 145 MG tablet Take 1 tablet by mouth once daily 90 tablet 3   Insulin  Pen Needle (ASSURE ID DUO PRO PEN NEEDLES) 31G X 5 MM MISC 1 each by Does not apply route daily. 30 each 24   JARDIANCE 25 MG TABS tablet Take 25 mg by mouth daily.     LANTUS  SOLOSTAR 100 UNIT/ML Solostar Pen Inject 36 Units into the skin at bedtime. PT takes lantus  takes in am     lisinopril  (ZESTRIL ) 5 MG tablet Take 5 mg by mouth in the morning and at bedtime. Pt  takes in pm     Magnesium  400 MG CAPS Take 400 mg by mouth in the morning and at bedtime.     metoprolol  succinate (TOPROL -XL) 25 MG 24 hr tablet Take 1 tablet (25 mg total) by mouth daily. 90 tablet 1   MOUNJARO 2.5 MG/0.5ML Pen Inject 2.5 mg into the skin once a week.     Multiple Vitamin (MULTIVITAMIN WITH MINERALS) TABS tablet Take 1 tablet by mouth every evening.     NOVOLOG  FLEXPEN 100 UNIT/ML FlexPen Inject 8-16 Units into the skin 2 (two) times daily before a meal. Per sliding scale     omeprazole (PRILOSEC) 20 MG capsule Take 20 mg by mouth daily before breakfast.     ondansetron  (ZOFRAN ) 4 MG tablet Take 1 tablet (4 mg total) by mouth every 8 (eight) hours as needed for nausea or vomiting. 10 tablet 0   oxyCODONE  (ROXICODONE ) 5 MG immediate release tablet Take 1 tablet (5 mg total) by mouth every  4 (four) hours as needed for severe pain (pain score 7-10). 30 tablet 0   rosuvastatin  (CRESTOR ) 10 MG tablet Take 10 mg by mouth at bedtime.     sennosides-docusate sodium  (SENOKOT-S) 8.6-50 MG tablet Take 2 tablets by mouth daily. 30 tablet 1   No current facility-administered medications for this visit.   Facility-Administered Medications Ordered in Other Visits  Medication Dose Route Frequency Provider Last Rate Last Admin   iodixanol  (VISIPAQUE ) 320 MG/ML injection    PRN Serene Gaile ORN, MD   120 mL at 08/20/22 1145    Family History  Problem Relation Age of Onset   Heart failure Father     Social History   Socioeconomic History   Marital status: Divorced    Spouse name: Not on file   Number of children: 0   Years of education: Not on file   Highest education level: Not on file  Occupational History   Occupation: deliver truck parts  Tobacco Use   Smoking status: Former    Current packs/day: 0.00    Average packs/day: 1.5 packs/day for 30.0 years (45.0 ttl pk-yrs)    Types: Cigarettes, Cigars    Start date: 72    Quit date: 2009    Years since quitting: 17.1    Smokeless tobacco: Never  Vaping Use   Vaping status: Never Used  Substance and Sexual Activity   Alcohol  use: Yes    Alcohol /week: 3.0 standard drinks of alcohol     Types: 3 Cans of beer per week    Comment: occas   Drug use: Never   Sexual activity: Not on file  Other Topics Concern   Not on file  Social History Narrative   Not on file   Social Drivers of Health   Tobacco Use: Medium Risk (10/05/2024)   Patient History    Smoking Tobacco Use: Former    Smokeless Tobacco Use: Never    Passive Exposure: Not on Actuary Strain: Not on file  Food Insecurity: No Food Insecurity (02/06/2023)   Hunger Vital Sign    Worried About Running Out of Food in the Last Year: Never true    Ran Out of Food in the Last Year: Never true  Transportation Needs: No Transportation Needs (02/06/2023)   PRAPARE - Administrator, Civil Service (Medical): No    Lack of Transportation (Non-Medical): No  Physical Activity: Not on file  Stress: Not on file  Social Connections: Not on file  Intimate Partner Violence: Not At Risk (02/06/2023)   Humiliation, Afraid, Rape, and Kick questionnaire    Fear of Current or Ex-Partner: No    Emotionally Abused: No    Physically Abused: No    Sexually Abused: No  Depression (PHQ2-9): Not on file  Alcohol  Screen: Not on file  Housing: Low Risk (02/06/2023)   Housing    Last Housing Risk Score: 0  Utilities: Not At Risk (02/06/2023)   AHC Utilities    Threatened with loss of utilities: No  Health Literacy: Not on file     REVIEW OF SYSTEMS:  *** [X]  denotes positive finding, [ ]  denotes negative finding Cardiac  Comments:  Chest pain or chest pressure:    Shortness of breath upon exertion:    Short of breath when lying flat:    Irregular heart rhythm:        Vascular    Pain in calf, thigh, or hip brought on by ambulation:    Pain in  feet at night that wakes you up from your sleep:     Blood clot in your veins:    Leg  swelling:         Pulmonary    Oxygen at home:    Productive cough:     Wheezing:         Neurologic    Sudden weakness in arms or legs:     Sudden numbness in arms or legs:     Sudden onset of difficulty speaking or slurred speech:    Temporary loss of vision in one eye:     Problems with dizziness:         Gastrointestinal    Blood in stool:     Vomited blood:         Genitourinary    Burning when urinating:     Blood in urine:        Psychiatric    Major depression:         Hematologic    Bleeding problems:    Problems with blood clotting too easily:        Skin    Rashes or ulcers:        Constitutional    Fever or chills:      PHYSICAL EXAMINATION:  ***  General:  WDWN in NAD; vital signs documented above Gait: Not observed HENT: WNL, normocephalic Pulmonary: normal non-labored breathing , without wheezing Cardiac: {Desc; regular/irreg:14544} HR, {With/Without:20273} carotid bruit*** Abdomen: soft, NT; aortic pulse is *** palpable Skin: {With/Without:20273} rashes Vascular Exam/Pulses:  Right Left  Radial {Exam; arterial pulse strength 0-4:30167} {Exam; arterial pulse strength 0-4:30167}  Femoral {Exam; arterial pulse strength 0-4:30167} {Exam; arterial pulse strength 0-4:30167}  Popliteal {Exam; arterial pulse strength 0-4:30167} {Exam; arterial pulse strength 0-4:30167}  DP {Exam; arterial pulse strength 0-4:30167} {Exam; arterial pulse strength 0-4:30167}  PT {Exam; arterial pulse strength 0-4:30167} {Exam; arterial pulse strength 0-4:30167}  Peroneal *** ***   Extremities: {With/Without:20273} ischemic changes, {With/Without:20273} Gangrene , {With/Without:20273} cellulitis; {With/Without:20273} open wounds Musculoskeletal: no muscle wasting or atrophy  Neurologic: A&O X 3 Psychiatric:  The pt has {Desc; normal/abnormal:11317::Normal} affect.   Non-Invasive Vascular Imaging:   ABI's/TBI's on 01/26/2025: Right:  0.96/0.21 - Great toe pressure:  31 Left:  0.66/0.23 - Great toe pressure: 35  Arterial duplex on 01/26/2025: +-----------+--------+-----+---------------+---------+--------+  LEFT      PSV cm/sRatioStenosis       Waveform Comments  +-----------+--------+-----+---------------+---------+--------+  CFA Distal 172                         biphasic           +-----------+--------+-----+---------------+---------+--------+  DFA       133                         biphasic           +-----------+--------+-----+---------------+---------+--------+  SFA Prox   469          75-99% stenosisbiphasic           +-----------+--------+-----+---------------+---------+--------+  SFA Mid    62                          biphasic           +-----------+--------+-----+---------------+---------+--------+  SFA Distal 32  biphasic           +-----------+--------+-----+---------------+---------+--------+  POP Prox   66                          biphasic           +-----------+--------+-----+---------------+---------+--------+  POP Distal 37                          biphasic           +-----------+--------+-----+---------------+---------+--------+  ATA Distal 32                          biphasic           +-----------+--------+-----+---------------+---------+--------+  PTA Distal 22                          triphasic          +-----------+--------+-----+---------------+---------+--------+  PERO Distal85                          biphasic           +-----------+--------+-----+---------------+---------+--------+   A focal velocity elevation of 469 cm/s was obtained at SFA Prox with a VR  of 7.7.    Left Stent(s):  +---------------+--------+--------+--------+--------+  PopA          PSV cm/sStenosisWaveformComments  +---------------+--------+--------+--------+--------+  Prox to Stent  38              biphasic           +---------------+--------+--------+--------+--------+  Proximal Stent 66              biphasic          +---------------+--------+--------+--------+--------+  Mid Stent      71              biphasic          +---------------+--------+--------+--------+--------+  Distal Stent   40              biphasic          +---------------+--------+--------+--------+--------+  Distal to Stent37              biphasic          +---------------+--------+--------+--------+--------+   Summary:  Right: 30-49% stenosis noted in the superficial femoral artery. Patent stent with no evidence of stenosis in the popliteal artery artery.   Left: 75-99% stenosis noted in the superficial femoral artery. Patent stent with no evidence of stenosis in the popliteal artery artery.    Previous ABI's/TBI's on 07/19/2024: Right:  1.15/0.60 - Great toe pressure: 93 Left:  1.06/0.55 - Great toe pressure:  86   Previous carotid duplex 07/19/2024: Bilateral 1-39% ICA stenosis    ASSESSMENT/PLAN:: 69 y.o. male here for follow up for PAD with hx of:  -right iliofemoral endarterectomy with bovine patch angioplasty on 04/26/2022 by Dr. Serene for RLE claudication.   -angiogram with orbital atherectomy and drug coated balloon angioplasty right popliteal artery on 08/20/2022 by Dr. Serene for RLE claudication.  -angiogram with shock wave intra arterial lithotripsy and stent left popliteal artery 11/19/2022 Dr. Serene for LLE claudication -angiogram with shockwave intra arterial lithotripsy and drug coated balloon angioplasty left SFA on 02/24/2024   -*** -continue asa/plavix /statin -discussed importance of increased walking daily -pt will f/u in *** with ***.   Riddhi Grether  Lakeysha Slutsky, [facility] [phone]  Clinic MD:   [place]    [1]  Allergies Allergen Reactions   Codeine Itching and Other (See Comments)    Can take/tolerate in lower doses   Keflex [Cephalexin] Other  (See Comments)    Shriveled my skin   Penicillins Other (See Comments)    Reaction from childhood not recalled   "

## 2025-02-02 ENCOUNTER — Ambulatory Visit
